# Patient Record
Sex: Female | Born: 1940 | Race: White | Hispanic: No | Smoking: Former smoker
Health system: Southern US, Community
[De-identification: ages and names within clinical notes are randomized; demographics above are authoritative.]

## PROBLEM LIST (undated history)

## (undated) ENCOUNTER — Emergency Department (HOSPITAL_COMMUNITY): Payer: No Typology Code available for payment source

## (undated) DIAGNOSIS — R296 Repeated falls: Secondary | ICD-10-CM

## (undated) DIAGNOSIS — Z87442 Personal history of urinary calculi: Secondary | ICD-10-CM

## (undated) DIAGNOSIS — M1990 Unspecified osteoarthritis, unspecified site: Secondary | ICD-10-CM

## (undated) DIAGNOSIS — D649 Anemia, unspecified: Secondary | ICD-10-CM

## (undated) DIAGNOSIS — G629 Polyneuropathy, unspecified: Secondary | ICD-10-CM

## (undated) DIAGNOSIS — H3530 Unspecified macular degeneration: Secondary | ICD-10-CM

## (undated) DIAGNOSIS — K5900 Constipation, unspecified: Secondary | ICD-10-CM

## (undated) DIAGNOSIS — F329 Major depressive disorder, single episode, unspecified: Secondary | ICD-10-CM

## (undated) DIAGNOSIS — C801 Malignant (primary) neoplasm, unspecified: Secondary | ICD-10-CM

## (undated) DIAGNOSIS — Z8709 Personal history of other diseases of the respiratory system: Secondary | ICD-10-CM

## (undated) DIAGNOSIS — M2550 Pain in unspecified joint: Secondary | ICD-10-CM

## (undated) DIAGNOSIS — C55 Malignant neoplasm of uterus, part unspecified: Secondary | ICD-10-CM

## (undated) DIAGNOSIS — M810 Age-related osteoporosis without current pathological fracture: Secondary | ICD-10-CM

## (undated) DIAGNOSIS — R002 Palpitations: Secondary | ICD-10-CM

## (undated) DIAGNOSIS — M549 Dorsalgia, unspecified: Secondary | ICD-10-CM

## (undated) DIAGNOSIS — I10 Essential (primary) hypertension: Secondary | ICD-10-CM

## (undated) DIAGNOSIS — G2581 Restless legs syndrome: Secondary | ICD-10-CM

## (undated) DIAGNOSIS — I739 Peripheral vascular disease, unspecified: Secondary | ICD-10-CM

## (undated) DIAGNOSIS — R600 Localized edema: Secondary | ICD-10-CM

## (undated) DIAGNOSIS — E559 Vitamin D deficiency, unspecified: Secondary | ICD-10-CM

## (undated) DIAGNOSIS — F32A Depression, unspecified: Secondary | ICD-10-CM

## (undated) DIAGNOSIS — E785 Hyperlipidemia, unspecified: Secondary | ICD-10-CM

## (undated) DIAGNOSIS — R911 Solitary pulmonary nodule: Secondary | ICD-10-CM

## (undated) DIAGNOSIS — R51 Headache: Secondary | ICD-10-CM

## (undated) DIAGNOSIS — G8929 Other chronic pain: Secondary | ICD-10-CM

## (undated) DIAGNOSIS — R531 Weakness: Secondary | ICD-10-CM

## (undated) DIAGNOSIS — F419 Anxiety disorder, unspecified: Secondary | ICD-10-CM

## (undated) DIAGNOSIS — R202 Paresthesia of skin: Secondary | ICD-10-CM

## (undated) DIAGNOSIS — R519 Headache, unspecified: Secondary | ICD-10-CM

## (undated) DIAGNOSIS — H547 Unspecified visual loss: Secondary | ICD-10-CM

## (undated) DIAGNOSIS — Z8739 Personal history of other diseases of the musculoskeletal system and connective tissue: Secondary | ICD-10-CM

## (undated) DIAGNOSIS — R609 Edema, unspecified: Secondary | ICD-10-CM

## (undated) DIAGNOSIS — Z8614 Personal history of Methicillin resistant Staphylococcus aureus infection: Secondary | ICD-10-CM

## (undated) DIAGNOSIS — R351 Nocturia: Secondary | ICD-10-CM

## (undated) DIAGNOSIS — K219 Gastro-esophageal reflux disease without esophagitis: Secondary | ICD-10-CM

## (undated) HISTORY — PX: COLONOSCOPY: SHX174

## (undated) HISTORY — PX: BACK SURGERY: SHX140

## (undated) HISTORY — PX: NOSE SURGERY: SHX723

## (undated) HISTORY — DX: Age-related osteoporosis without current pathological fracture: M81.0

## (undated) HISTORY — PX: CERVICAL SPINE SURGERY: SHX589

## (undated) HISTORY — PX: HEMORRHOID SURGERY: SHX153

## (undated) HISTORY — DX: Solitary pulmonary nodule: R91.1

## (undated) HISTORY — DX: Palpitations: R00.2

## (undated) HISTORY — PX: CYSTOSCOPY: SUR368

## (undated) HISTORY — PX: FOOT SURGERY: SHX648

## (undated) HISTORY — PX: EYE SURGERY: SHX253

## (undated) HISTORY — PX: OTHER SURGICAL HISTORY: SHX169

## (undated) HISTORY — DX: Hyperlipidemia, unspecified: E78.5

## (undated) HISTORY — PX: JOINT REPLACEMENT: SHX530

---

## 1989-04-15 HISTORY — PX: ABDOMINAL HYSTERECTOMY: SHX81

## 2001-01-05 ENCOUNTER — Inpatient Hospital Stay (HOSPITAL_COMMUNITY): Admission: RE | Admit: 2001-01-05 | Discharge: 2001-01-09 | Payer: Self-pay | Admitting: Neurosurgery

## 2001-02-03 ENCOUNTER — Encounter: Admission: RE | Admit: 2001-02-03 | Discharge: 2001-02-03 | Payer: Self-pay | Admitting: Neurosurgery

## 2001-02-13 ENCOUNTER — Encounter: Admission: RE | Admit: 2001-02-13 | Discharge: 2001-02-23 | Payer: Self-pay | Admitting: Neurosurgery

## 2002-04-12 ENCOUNTER — Inpatient Hospital Stay (HOSPITAL_COMMUNITY): Admission: RE | Admit: 2002-04-12 | Discharge: 2002-04-16 | Payer: Self-pay | Admitting: Neurosurgery

## 2002-11-04 ENCOUNTER — Encounter: Admission: RE | Admit: 2002-11-04 | Discharge: 2002-12-01 | Payer: Self-pay | Admitting: Neurosurgery

## 2004-03-15 ENCOUNTER — Ambulatory Visit (HOSPITAL_COMMUNITY): Admission: RE | Admit: 2004-03-15 | Discharge: 2004-03-15 | Payer: Self-pay | Admitting: Gastroenterology

## 2004-04-10 ENCOUNTER — Ambulatory Visit (HOSPITAL_COMMUNITY): Admission: RE | Admit: 2004-04-10 | Discharge: 2004-04-10 | Payer: Self-pay | Admitting: Neurosurgery

## 2004-06-18 ENCOUNTER — Inpatient Hospital Stay (HOSPITAL_COMMUNITY): Admission: RE | Admit: 2004-06-18 | Discharge: 2004-06-23 | Payer: Self-pay | Admitting: Neurosurgery

## 2004-10-24 ENCOUNTER — Emergency Department (HOSPITAL_COMMUNITY): Admission: EM | Admit: 2004-10-24 | Discharge: 2004-10-24 | Payer: Self-pay | Admitting: Emergency Medicine

## 2005-01-03 ENCOUNTER — Ambulatory Visit (HOSPITAL_COMMUNITY): Admission: RE | Admit: 2005-01-03 | Discharge: 2005-01-03 | Payer: Self-pay | Admitting: Otolaryngology

## 2005-01-03 ENCOUNTER — Ambulatory Visit (HOSPITAL_BASED_OUTPATIENT_CLINIC_OR_DEPARTMENT_OTHER): Admission: RE | Admit: 2005-01-03 | Discharge: 2005-01-03 | Payer: Self-pay | Admitting: Otolaryngology

## 2005-05-13 DIAGNOSIS — I10 Essential (primary) hypertension: Secondary | ICD-10-CM | POA: Insufficient documentation

## 2005-08-05 ENCOUNTER — Encounter: Admission: RE | Admit: 2005-08-05 | Discharge: 2005-08-21 | Payer: Self-pay | Admitting: Neurosurgery

## 2005-08-25 ENCOUNTER — Ambulatory Visit (HOSPITAL_COMMUNITY): Admission: RE | Admit: 2005-08-25 | Discharge: 2005-08-25 | Payer: Self-pay | Admitting: Neurosurgery

## 2005-11-29 ENCOUNTER — Ambulatory Visit (HOSPITAL_COMMUNITY): Admission: RE | Admit: 2005-11-29 | Discharge: 2005-11-29 | Payer: Self-pay | Admitting: Anesthesiology

## 2006-06-12 ENCOUNTER — Ambulatory Visit (HOSPITAL_COMMUNITY): Admission: RE | Admit: 2006-06-12 | Discharge: 2006-06-12 | Payer: Self-pay | Admitting: Neurosurgery

## 2006-08-14 DIAGNOSIS — C55 Malignant neoplasm of uterus, part unspecified: Secondary | ICD-10-CM | POA: Insufficient documentation

## 2006-10-23 ENCOUNTER — Encounter: Admission: RE | Admit: 2006-10-23 | Discharge: 2006-10-23 | Payer: Self-pay | Admitting: Orthopedic Surgery

## 2006-10-28 ENCOUNTER — Inpatient Hospital Stay (HOSPITAL_COMMUNITY): Admission: RE | Admit: 2006-10-28 | Discharge: 2006-11-03 | Payer: Self-pay | Admitting: Orthopedic Surgery

## 2006-10-31 ENCOUNTER — Ambulatory Visit: Payer: Self-pay | Admitting: Physical Medicine & Rehabilitation

## 2006-12-02 ENCOUNTER — Encounter: Admission: RE | Admit: 2006-12-02 | Discharge: 2007-01-01 | Payer: Self-pay | Admitting: Orthopaedic Surgery

## 2007-02-03 ENCOUNTER — Encounter: Admission: RE | Admit: 2007-02-03 | Discharge: 2007-02-03 | Payer: Self-pay | Admitting: Neurosurgery

## 2007-02-23 ENCOUNTER — Encounter: Admission: RE | Admit: 2007-02-23 | Discharge: 2007-05-24 | Payer: Self-pay | Admitting: Neurosurgery

## 2007-04-07 ENCOUNTER — Encounter: Admission: RE | Admit: 2007-04-07 | Discharge: 2007-04-07 | Payer: Self-pay | Admitting: Neurosurgery

## 2007-05-11 DIAGNOSIS — E049 Nontoxic goiter, unspecified: Secondary | ICD-10-CM | POA: Insufficient documentation

## 2007-07-16 ENCOUNTER — Inpatient Hospital Stay (HOSPITAL_COMMUNITY): Admission: RE | Admit: 2007-07-16 | Discharge: 2007-07-17 | Payer: Self-pay | Admitting: Neurosurgery

## 2007-10-24 ENCOUNTER — Encounter: Admission: RE | Admit: 2007-10-24 | Discharge: 2007-10-24 | Payer: Self-pay | Admitting: Neurosurgery

## 2007-11-19 ENCOUNTER — Encounter: Admission: RE | Admit: 2007-11-19 | Discharge: 2007-11-19 | Payer: Self-pay | Admitting: Neurosurgery

## 2008-04-29 ENCOUNTER — Encounter: Admission: RE | Admit: 2008-04-29 | Discharge: 2008-04-29 | Payer: Self-pay | Admitting: Neurosurgery

## 2008-05-03 ENCOUNTER — Emergency Department (HOSPITAL_COMMUNITY): Admission: EM | Admit: 2008-05-03 | Discharge: 2008-05-03 | Payer: Self-pay | Admitting: Emergency Medicine

## 2008-05-22 ENCOUNTER — Ambulatory Visit: Payer: Self-pay | Admitting: Radiology

## 2008-05-22 ENCOUNTER — Emergency Department (HOSPITAL_BASED_OUTPATIENT_CLINIC_OR_DEPARTMENT_OTHER): Admission: EM | Admit: 2008-05-22 | Discharge: 2008-05-22 | Payer: Self-pay | Admitting: Emergency Medicine

## 2008-06-13 ENCOUNTER — Ambulatory Visit (HOSPITAL_COMMUNITY): Admission: RE | Admit: 2008-06-13 | Discharge: 2008-06-13 | Payer: Self-pay | Admitting: Ophthalmology

## 2008-11-03 ENCOUNTER — Encounter: Admission: RE | Admit: 2008-11-03 | Discharge: 2008-11-03 | Payer: Self-pay | Admitting: Family Medicine

## 2009-03-15 ENCOUNTER — Encounter: Payer: Self-pay | Admitting: Cardiovascular Disease

## 2009-03-20 DIAGNOSIS — M109 Gout, unspecified: Secondary | ICD-10-CM | POA: Insufficient documentation

## 2009-04-15 DIAGNOSIS — Z8614 Personal history of Methicillin resistant Staphylococcus aureus infection: Secondary | ICD-10-CM

## 2009-04-15 HISTORY — DX: Personal history of Methicillin resistant Staphylococcus aureus infection: Z86.14

## 2009-05-05 ENCOUNTER — Encounter: Admission: RE | Admit: 2009-05-05 | Discharge: 2009-05-05 | Payer: Self-pay | Admitting: Neurology

## 2009-05-16 ENCOUNTER — Encounter: Payer: Self-pay | Admitting: Cardiovascular Disease

## 2009-05-19 ENCOUNTER — Encounter: Payer: Self-pay | Admitting: Cardiovascular Disease

## 2009-05-31 DIAGNOSIS — E782 Mixed hyperlipidemia: Secondary | ICD-10-CM | POA: Insufficient documentation

## 2009-05-31 DIAGNOSIS — E785 Hyperlipidemia, unspecified: Secondary | ICD-10-CM | POA: Insufficient documentation

## 2009-05-31 DIAGNOSIS — M810 Age-related osteoporosis without current pathological fracture: Secondary | ICD-10-CM | POA: Insufficient documentation

## 2009-05-31 DIAGNOSIS — H3530 Unspecified macular degeneration: Secondary | ICD-10-CM | POA: Insufficient documentation

## 2009-05-31 DIAGNOSIS — R002 Palpitations: Secondary | ICD-10-CM | POA: Insufficient documentation

## 2009-05-31 DIAGNOSIS — E119 Type 2 diabetes mellitus without complications: Secondary | ICD-10-CM | POA: Insufficient documentation

## 2009-05-31 DIAGNOSIS — I10 Essential (primary) hypertension: Secondary | ICD-10-CM | POA: Insufficient documentation

## 2009-06-05 ENCOUNTER — Ambulatory Visit: Payer: Self-pay | Admitting: Cardiovascular Disease

## 2009-06-05 DIAGNOSIS — M79609 Pain in unspecified limb: Secondary | ICD-10-CM | POA: Insufficient documentation

## 2009-06-08 ENCOUNTER — Ambulatory Visit (HOSPITAL_COMMUNITY): Admission: RE | Admit: 2009-06-08 | Discharge: 2009-06-08 | Payer: Self-pay | Admitting: Cardiovascular Disease

## 2009-06-08 ENCOUNTER — Encounter: Payer: Self-pay | Admitting: Cardiovascular Disease

## 2009-06-08 ENCOUNTER — Ambulatory Visit: Payer: Self-pay | Admitting: Internal Medicine

## 2009-06-08 ENCOUNTER — Ambulatory Visit: Payer: Self-pay

## 2009-07-03 ENCOUNTER — Ambulatory Visit: Payer: Self-pay | Admitting: Cardiovascular Disease

## 2009-07-26 ENCOUNTER — Encounter (HOSPITAL_BASED_OUTPATIENT_CLINIC_OR_DEPARTMENT_OTHER): Admission: RE | Admit: 2009-07-26 | Discharge: 2009-09-06 | Payer: Self-pay | Admitting: Internal Medicine

## 2009-07-27 ENCOUNTER — Ambulatory Visit (HOSPITAL_COMMUNITY): Admission: RE | Admit: 2009-07-27 | Discharge: 2009-07-27 | Payer: Self-pay | Admitting: Internal Medicine

## 2009-08-19 ENCOUNTER — Ambulatory Visit (HOSPITAL_COMMUNITY): Admission: RE | Admit: 2009-08-19 | Discharge: 2009-08-19 | Payer: Self-pay | Admitting: Internal Medicine

## 2009-12-05 DIAGNOSIS — M5030 Other cervical disc degeneration, unspecified cervical region: Secondary | ICD-10-CM | POA: Insufficient documentation

## 2009-12-05 DIAGNOSIS — M4802 Spinal stenosis, cervical region: Secondary | ICD-10-CM | POA: Insufficient documentation

## 2010-01-19 ENCOUNTER — Encounter: Admission: RE | Admit: 2010-01-19 | Discharge: 2010-01-19 | Payer: Self-pay | Admitting: Neurosurgery

## 2010-02-28 ENCOUNTER — Inpatient Hospital Stay (HOSPITAL_COMMUNITY): Admission: RE | Admit: 2010-02-28 | Discharge: 2010-03-02 | Payer: Self-pay | Admitting: Neurosurgery

## 2010-05-07 ENCOUNTER — Encounter: Payer: Self-pay | Admitting: Neurosurgery

## 2010-05-15 NOTE — Procedures (Signed)
Summary: End of Service Summary Report  End of Service Summary Report   Imported By: Debby Freiberg 07/24/2009 16:45:11  _____________________________________________________________________  External Attachment:    Type:   Image     Comment:   External Document

## 2010-05-15 NOTE — Assessment & Plan Note (Signed)
Summary: np6/palps   Visit Type:  new pt visit Primary Provider:  Dewain Penning  CC:  palpitations..pt states that Dr. Yehuda Budd could not find her pedal pulse....edema/ankles.Marland Kitchendenies any cp or sob.  History of Present Illness: 70 yo WF with history of DM, HTN, Hyperlipidemia, OA who is here today for further evaluation of palpitations. She describes "quivering" in her chest that occurs during the day and at night. It never wakes her up from her sleep. No associated dizziness, SOB, chest pain. She is limited by her back pain. She has had four prior back surgeries. She has had a prior stress test before 2005. She describes restless legs at night and also cramping of her legs at night. She has no claudication type symptoms. There has been difficulty palpating her pulses in the past but she has never had arterial doppler examinations. She does have an ulcer on the second left toe that has been followed by podiatry with ongoing treatment with antibiotics.   Current Medications (verified): 1)  Micardis Hct 80-12.5 Mg Tabs (Telmisartan-Hctz) .Marland Kitchen.. 1 Tab Once Daily 2)  Hydrochlorothiazide 25 Mg Tabs (Hydrochlorothiazide) .Marland Kitchen.. 1 Tab Once Daily 3)  Effexor Xr 75 Mg Xr24h-Cap (Venlafaxine Hcl) .Marland Kitchen.. 1 Tab Once Daily 4)  Colchicine 0.6 Mg Tabs (Colchicine) .Marland Kitchen.. 1 Tab Three Times A Day 5)  Aspirin Ec 325 Mg Tbec (Aspirin) .... Take One Tablet By Mouth Daily 6)  Folic Acid 400 Mcg Tabs (Folic Acid) .Marland Kitchen.. 1 Tab Once Daily 7)  Metformin Hcl 500 Mg Tabs (Metformin Hcl) .Marland Kitchen.. 1 Tab Once Daily 8)  Hydrocodone-Acetaminophen 10-500 Mg Tabs (Hydrocodone-Acetaminophen) .Marland Kitchen.. 1 Tab Once Daily As Needed 9)  Simvastatin 80 Mg Tabs (Simvastatin) .Marland Kitchen.. 1 Tab At Bedtime 10)  Diazepam 10 Mg Tabs (Diazepam) .Marland Kitchen.. 1 Tab At Bedtime 11)  Levaquin 500 Mg Tabs (Levofloxacin) .Marland Kitchen.. 1 Tab Once Daily 12)  Calcium-Vitamin D 600-200 Mg-Unit Tabs (Calcium-Vitamin D) .Marland Kitchen.. 1 Tab Once Daily 13)  Magnesium Oxide 400 Mg Caps (Magnesium Oxide) .Marland Kitchen.. 1  Cap Once Daily 14)  Elite Zinc 15 Mg Caps (Zinc) .Marland Kitchen.. 1 Tab Once Daily 15)  Fish Oil 1000 Mg Caps (Omega-3 Fatty Acids) .Marland Kitchen.. 1 Cap Once Daily 16)  Vitamin C 500 Mg Tabs (Ascorbic Acid) .Marland Kitchen.. 1 Tab Once Daily 17)  Vitamin E 400 Unit Caps (Vitamin E) .Marland Kitchen.. 1 Cap Once Daily 18)  Vigamox 0.5 % Soln (Moxifloxacin Hcl) .... Use As Directed When Getting Eye Injection Monthly 19)  Colace 100 Mg Caps (Docusate Sodium) .Marland Kitchen.. 1 Cap At Bedtime 20)  Vision Formula  Tabs (Multiple Vitamins-Minerals) .Marland Kitchen.. 1 Tab Once Daily  Past History:  Past Medical History: HYPERLIPIDEMIA (ICD-272.4) HYPERTENSION (ICD-401.9) Osteoarthritis MACULAR DEGENERATION (ICD-362.50) DIABETES MELLITUS, TYPE II (ICD-250.00) Uterine cancer    Past Surgical History:  back surgery x 3, cervical  disk  surgery x 1, hemorrhoidectomy hysterectomy Right knee replacement nasal septoplasty   Family History: Reviewed history and no changes required. Mother alive at age 71. She has h/o CAD/CABG at age 55, colon cancer Father: deceased, CAD/ CABG. CVA at 73. Sister with NSTEMI age 79 Brother deceased age 30 MI Brother alive age 64 MI  Social History: Reviewed history from 05/31/2009 and no changes required. Tobacco Use -Remote use. Smoked 1ppd for 45 years. None since 2002.  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  Review of Systems       The patient complains of palpitations, joint pain, leg swelling, and skin lesions.  The patient denies fatigue, malaise, fever, weight gain/loss, vision loss, decreased hearing, hoarseness, chest pain, shortness of breath, prolonged cough, wheezing, sleep apnea, coughing up blood, abdominal pain, blood in stool, nausea, vomiting, diarrhea, heartburn, incontinence, blood in urine, muscle weakness, rash, headache, fainting, dizziness, depression, anxiety, enlarged lymph nodes, easy bruising or bleeding, and environmental allergies.    Vital  Signs:  Patient profile:   70 year old female Height:      65 inches Weight:      219 pounds BMI:     36.58 Pulse rate:   81 / minute Pulse rhythm:   regular BP sitting:   114 / 60  (left arm) Cuff size:   large  Vitals Entered By: Danielle Rankin, CMA (June 05, 2009 3:06 PM)  Physical Exam  General:  General: Well developed, well nourished, NAD HEENT: OP clear, mucus membranes moist SKIN: warm, dry Neuro: No focal deficits Musculoskeletal: Muscle strength 5/5 all ext Psychiatric: Mood and affect normal Neck: No JVD, no carotid bruits, no thyromegaly, no lymphadenopathy. Lungs:Clear bilaterally, no wheezes, rhonci, crackles CV: RRR no murmurs, gallops rubs Abdomen: soft, NT, ND, BS present Extremities: No edema, pulses difficult to palpate. Hand held doppler with DP/PT pulses present bilaterally.     EKG  Procedure date:  06/05/2009  Findings:      NSR, rate 81 bpm.   Impression & Recommendations:  Problem # 1:  PALPITATIONS (ICD-785.1) This may represent premature beats but will have her wear a 21 day monitor to better define and rule out atrial fibrillation, SVT or ventricular arrhythmias. Her symptoms are occuring once every 10 days so I think it is necessary to have her wear the monitor for 21 days. Will check echo to assess LV size and function.   Her updated medication list for this problem includes:    Aspirin Ec 325 Mg Tbec (Aspirin) .Marland Kitchen... Take one tablet by mouth daily  Orders: EKG w/ Interpretation (93000) Event (Event) Echocardiogram (Echo)  Problem # 2:  LEG PAIN, BILATERAL (ICD-729.5) Her symptoms are at rest and at night. She has no claudication. Hand held doppler with good signals in the bilateral DP and PT arteries. No further PV workup suggested at this time.   Patient Instructions: 1)  Your physician recommends that you schedule a follow-up appointment in: 4 weeks 2)  Your physician has requested that you have an echocardiogram.   Echocardiography is a painless test that uses sound waves to create images of your heart. It provides your doctor with information about the size and shape of your heart and how well your heart's chambers and valves are working.  This procedure takes approximately one hour. There are no restrictions for this procedure. 3)  Your physician has recommended that you wear an event monitor.  Event monitors are medical devices that record the heart's electrical activity. Doctors most often use these monitors to diagnose arrhythmias. Arrhythmias are problems with the speed or rhythm of the heartbeat. The monitor is a small, portable device. You can wear one while you do your normal daily activities. This is usually used to diagnose what is causing palpitations/syncope (passing out).

## 2010-05-15 NOTE — Assessment & Plan Note (Signed)
Summary: 1 month rov   Visit Type:  Follow-up Primary Provider:  Herb Grays  CC:  edema/feet/hands...denies any cp or sob.  History of Present Illness: 70 yo WF with history of DM, HTN, Hyperlipidemia, OA who is here today for cardiac follow up. She was seen as a new patient one month ago for further evaluation of palpitations. She described "quivering" in her chest that occurs during the day and at night. It never wakes her up from her sleep. No associated dizziness, SOB, chest pain. She is limited by her back pain. She has had four prior back surgeries. She has had a prior stress test before 2005. She describes restless legs at night and also cramping of her legs at night. She has no claudication type symptoms. There has been difficulty palpating her pulses in the past but she has never had arterial doppler examinations. She does have an ulcer on the second left toe that has been followed by podiatry with ongoing treatment with antibiotics. At the first visit, I ordered an echo and an event monitor.   She is here for follow up and to review the results of her echo and event monitor. She has had rare palpitations and even had some while wearing the event monitor. No chest pain or SOB. She still has the non-healing ulcer on her left second toe. She has been told by her podiatrist that she needs an amputation. She has not had formal arterial studies on the lower extremities but by handheld doppler, she does have patent DP and PT pulses on the left. She has been referred to see [facility] specialists tomorrow for further evaluation. She has no claudication.   Current Medications (verified): 1)  Micardis Hct 80-12.5 Mg Tabs (Telmisartan-Hctz) .Marland Kitchen.. 1 Tab Once Daily 2)  Hydrochlorothiazide 25 Mg Tabs (Hydrochlorothiazide) .Marland Kitchen.. 1 Tab Once Daily 3)  Effexor Xr 75 Mg Xr24h-Cap (Venlafaxine Hcl) .Marland Kitchen.. 1 Tab Once Daily 4)  Aspirin Ec 325 Mg Tbec (Aspirin) .... Take One Tablet By Mouth Daily 5)  Folic  Acid 400 Mcg Tabs (Folic Acid) .Marland Kitchen.. 1 Tab Once Daily 6)  Metformin Hcl 500 Mg Tabs (Metformin Hcl) .Marland Kitchen.. 1 Tab Once Daily 7)  Hydrocodone-Acetaminophen 10-500 Mg Tabs (Hydrocodone-Acetaminophen) .Marland Kitchen.. 1 Tab Once Daily As Needed 8)  Simvastatin 80 Mg Tabs (Simvastatin) .Marland Kitchen.. 1 Tab At Bedtime 9)  Diazepam 10 Mg Tabs (Diazepam) .Marland Kitchen.. 1 Tab At Bedtime 10)  Calcium-Vitamin D 600-200 Mg-Unit Tabs (Calcium-Vitamin D) .Marland Kitchen.. 1 Tab Once Daily 11)  Magnesium Oxide 400 Mg Caps (Magnesium Oxide) .Marland Kitchen.. 1 Cap Once Daily 12)  Elite Zinc 15 Mg Caps (Zinc) .Marland Kitchen.. 1 Tab Once Daily 13)  Fish Oil 1000 Mg Caps (Omega-3 Fatty Acids) .Marland Kitchen.. 1 Cap Once Daily 14)  Vitamin C 500 Mg Tabs (Ascorbic Acid) .Marland Kitchen.. 1 Tab Once Daily 15)  Vitamin E 400 Unit Caps (Vitamin E) .Marland Kitchen.. 1 Cap Once Daily 16)  Vigamox 0.5 % Soln (Moxifloxacin Hcl) .... Use As Directed When Getting Eye Injection Monthly 17)  Colace 100 Mg Caps (Docusate Sodium) .Marland Kitchen.. 1 Cap At Bedtime 18)  Vision Formula  Tabs (Multiple Vitamins-Minerals) .Marland Kitchen.. 1 Tab Once Daily  Allergies (verified): 1)  ! Codeine 2)  ! * Oxycontin  Past History:  Past Medical History: Reviewed history from 06/05/2009 and no changes required. HYPERLIPIDEMIA (ICD-272.4) HYPERTENSION (ICD-401.9) Osteoarthritis MACULAR DEGENERATION (ICD-362.50) DIABETES MELLITUS, TYPE II (ICD-250.00) Uterine cancer    Social History: Reviewed history from 06/05/2009 and no changes required. Tobacco Use -Remote use.  Smoked 1ppd for 45 years. None since 2002.  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  Review of Systems       The patient complains of palpitations.  The patient denies fatigue, malaise, fever, weight gain/loss, vision loss, decreased hearing, hoarseness, chest pain, shortness of breath, prolonged cough, wheezing, sleep apnea, coughing up blood, abdominal pain, blood in stool, nausea, vomiting, diarrhea, heartburn, incontinence, blood in urine, muscle  weakness, joint pain, leg swelling, rash, skin lesions, headache, fainting, dizziness, depression, anxiety, enlarged lymph nodes, easy bruising or bleeding, and environmental allergies.    Vital Signs:  Patient profile:   70 year old female Height:      65 inches Weight:      216 pounds BMI:     36.07 Pulse rate:   76 / minute Pulse rhythm:   regular BP sitting:   118 / 60  (left arm) Cuff size:   large  Vitals Entered By: Danielle Rankin, CMA (July 03, 2009 12:04 PM)  Physical Exam  General:  General: Well developed, well nourished, NAD Psychiatric: Mood and affect normal Neck: No JVD, no carotid bruits, no thyromegaly, no lymphadenopathy. Lungs:Clear bilaterally, no wheezes, rhonci, crackles CV: RRR no murmurs, gallops rubs Abdomen: soft, NT, ND, BS present Extremities: No edema. Pedal pulses difficult to palpate bilateral DP/PT. Handheld doppler with patent DP/PT bilaterally.  Bandage in place second left toe.    Echocardiogram  Procedure date:  06/08/2009  Findings:      - Left ventricle: The cavity size was normal. There was mild focal       basal hypertrophy of the septum. The estimated ejection fraction       was 55%. Wall motion was normal; there were no regional wall       motion abnormalities. Left ventricular diastolic function       parameters were normal.     - Left atrium: The atrium was mildly dilated.  Event Monitor  Procedure date:  07/03/2009  Findings:      Sinus rhythm. Rare Premature Atrial Contractions. No VT,SVT or atrial fibrillation.   Impression & Recommendations:  Problem # 1:  PALPITATIONS (ICD-785.1) Event monitor with rare PACs. No other rhythm abnormalities. Echo with normal LV size and function. No significant valvular issues. No further workup.   Her updated medication list for this problem includes:    Aspirin Ec 325 Mg Tbec (Aspirin) .Marland Kitchen... Take one tablet by mouth daily  Her updated medication list for this problem includes:     Aspirin Ec 325 Mg Tbec (Aspirin) .Marland Kitchen... Take one tablet by mouth daily  Patient Instructions: 1)  Your physician recommends that you schedule a follow-up appointment as needed  Appended Document: 1 month rov Event monitor reviewed. NSR with no SVT or VT. cdm

## 2010-06-26 LAB — GLUCOSE, CAPILLARY
Glucose-Capillary: 102 mg/dL — ABNORMAL HIGH (ref 70–99)
Glucose-Capillary: 104 mg/dL — ABNORMAL HIGH (ref 70–99)
Glucose-Capillary: 107 mg/dL — ABNORMAL HIGH (ref 70–99)
Glucose-Capillary: 108 mg/dL — ABNORMAL HIGH (ref 70–99)
Glucose-Capillary: 114 mg/dL — ABNORMAL HIGH (ref 70–99)
Glucose-Capillary: 117 mg/dL — ABNORMAL HIGH (ref 70–99)
Glucose-Capillary: 120 mg/dL — ABNORMAL HIGH (ref 70–99)
Glucose-Capillary: 130 mg/dL — ABNORMAL HIGH (ref 70–99)
Glucose-Capillary: 150 mg/dL — ABNORMAL HIGH (ref 70–99)
Glucose-Capillary: 186 mg/dL — ABNORMAL HIGH (ref 70–99)

## 2010-06-26 LAB — SURGICAL PCR SCREEN
MRSA, PCR: POSITIVE — AB
Staphylococcus aureus: POSITIVE — AB

## 2010-06-26 LAB — BASIC METABOLIC PANEL
BUN: 22 mg/dL (ref 6–23)
CO2: 33 mEq/L — ABNORMAL HIGH (ref 19–32)
Calcium: 10.1 mg/dL (ref 8.4–10.5)
Chloride: 99 mEq/L (ref 96–112)
Creatinine, Ser: 0.94 mg/dL (ref 0.4–1.2)
GFR calc Af Amer: >60 mL/min (ref >60)
GFR calc non Af Amer: 59 mL/min — ABNORMAL LOW (ref >60)
Glucose, Bld: 91 mg/dL (ref 70–99)
Potassium: 4.4 mEq/L (ref 3.5–5.1)
Sodium: 140 mEq/L (ref 135–145)

## 2010-06-26 LAB — CBC
HCT: 38.4 % (ref 36.0–46.0)
Hemoglobin: 12.5 g/dL (ref 12.0–15.0)
MCH: 31.3 pg (ref 26.0–34.0)
MCHC: 32.6 g/dL (ref 30.0–36.0)
MCV: 96 fL (ref 78.0–100.0)
Platelets: 238 10*3/uL (ref 150–400)
RBC: 4 MIL/uL (ref 3.87–5.11)
RDW: 12.6 % (ref 11.5–15.5)
WBC: 6.4 10*3/uL (ref 4.0–10.5)

## 2010-07-26 LAB — PROTIME-INR
INR: 0.9 (ref 0.00–1.49)
Prothrombin Time: 12 seconds (ref 11.6–15.2)

## 2010-07-26 LAB — GLUCOSE, CAPILLARY
Glucose-Capillary: 101 mg/dL — ABNORMAL HIGH (ref 70–99)
Glucose-Capillary: 85 mg/dL (ref 70–99)
Glucose-Capillary: 89 mg/dL (ref 70–99)

## 2010-07-26 LAB — CBC
HCT: 40.4 % (ref 36.0–46.0)
Hemoglobin: 14 g/dL (ref 12.0–15.0)
MCHC: 34.6 g/dL (ref 30.0–36.0)
MCV: 96.7 fL (ref 78.0–100.0)
Platelets: 250 10*3/uL (ref 150–400)
RBC: 4.18 MIL/uL (ref 3.87–5.11)
RDW: 12.6 % (ref 11.5–15.5)
WBC: 4.8 10*3/uL (ref 4.0–10.5)

## 2010-07-26 LAB — APTT: aPTT: 26 seconds (ref 24–37)

## 2010-07-26 LAB — BASIC METABOLIC PANEL
BUN: 14 mg/dL (ref 6–23)
CO2: 30 mEq/L (ref 19–32)
Calcium: 9.6 mg/dL (ref 8.4–10.5)
Chloride: 102 mEq/L (ref 96–112)
Creatinine, Ser: 0.79 mg/dL (ref 0.4–1.2)
GFR calc Af Amer: >60 mL/min (ref >60)
GFR calc non Af Amer: >60 mL/min (ref >60)
Glucose, Bld: 97 mg/dL (ref 70–99)
Potassium: 4 mEq/L (ref 3.5–5.1)
Sodium: 139 mEq/L (ref 135–145)

## 2010-07-30 LAB — CBC
HCT: 40.5 % (ref 36.0–46.0)
Hemoglobin: 13.2 g/dL (ref 12.0–15.0)
MCHC: 32.6 g/dL (ref 30.0–36.0)
MCV: 99.6 fL (ref 78.0–100.0)
Platelets: 332 10*3/uL (ref 150–400)
RBC: 4.07 MIL/uL (ref 3.87–5.11)
RDW: 12.1 % (ref 11.5–15.5)
WBC: 7.2 10*3/uL (ref 4.0–10.5)

## 2010-07-30 LAB — DIFFERENTIAL
Basophils Absolute: 0 10*3/uL (ref 0.0–0.1)
Basophils Relative: 0 % (ref 0–1)
Eosinophils Absolute: 0.2 10*3/uL (ref 0.0–0.7)
Eosinophils Relative: 2 % (ref 0–5)
Lymphocytes Relative: 35 % (ref 12–46)
Lymphs Abs: 2.5 10*3/uL (ref 0.7–4.0)
Monocytes Absolute: 0.5 10*3/uL (ref 0.1–1.0)
Monocytes Relative: 7 % (ref 3–12)
Neutro Abs: 3.9 10*3/uL (ref 1.7–7.7)
Neutrophils Relative %: 55 % (ref 43–77)

## 2010-07-30 LAB — SEDIMENTATION RATE: Sed Rate: 25 mm/hr — ABNORMAL HIGH (ref 0–22)

## 2010-08-20 ENCOUNTER — Emergency Department (HOSPITAL_COMMUNITY)
Admission: EM | Admit: 2010-08-20 | Discharge: 2010-08-20 | Disposition: A | Discharge status: Home / Self Care | Payer: Medicare Other | Attending: Emergency Medicine | Admitting: Emergency Medicine

## 2010-08-20 ENCOUNTER — Emergency Department (HOSPITAL_COMMUNITY): Payer: Medicare Other

## 2010-08-20 DIAGNOSIS — M810 Age-related osteoporosis without current pathological fracture: Secondary | ICD-10-CM | POA: Insufficient documentation

## 2010-08-20 DIAGNOSIS — Y9210 Unspecified residential institution as the place of occurrence of the external cause: Secondary | ICD-10-CM | POA: Insufficient documentation

## 2010-08-20 DIAGNOSIS — S1093XA Contusion of unspecified part of neck, initial encounter: Secondary | ICD-10-CM | POA: Insufficient documentation

## 2010-08-20 DIAGNOSIS — W010XXA Fall on same level from slipping, tripping and stumbling without subsequent striking against object, initial encounter: Secondary | ICD-10-CM | POA: Insufficient documentation

## 2010-08-20 DIAGNOSIS — Z79899 Other long term (current) drug therapy: Secondary | ICD-10-CM | POA: Insufficient documentation

## 2010-08-20 DIAGNOSIS — R220 Localized swelling, mass and lump, head: Secondary | ICD-10-CM | POA: Insufficient documentation

## 2010-08-20 DIAGNOSIS — R110 Nausea: Secondary | ICD-10-CM | POA: Insufficient documentation

## 2010-08-20 DIAGNOSIS — S0003XA Contusion of scalp, initial encounter: Secondary | ICD-10-CM | POA: Insufficient documentation

## 2010-08-20 DIAGNOSIS — I10 Essential (primary) hypertension: Secondary | ICD-10-CM | POA: Insufficient documentation

## 2010-08-20 DIAGNOSIS — IMO0002 Reserved for concepts with insufficient information to code with codable children: Secondary | ICD-10-CM | POA: Insufficient documentation

## 2010-08-20 DIAGNOSIS — E119 Type 2 diabetes mellitus without complications: Secondary | ICD-10-CM | POA: Insufficient documentation

## 2010-08-20 DIAGNOSIS — M542 Cervicalgia: Secondary | ICD-10-CM | POA: Insufficient documentation

## 2010-08-20 DIAGNOSIS — E785 Hyperlipidemia, unspecified: Secondary | ICD-10-CM | POA: Insufficient documentation

## 2010-08-20 DIAGNOSIS — R51 Headache: Secondary | ICD-10-CM | POA: Insufficient documentation

## 2010-08-20 DIAGNOSIS — S51009A Unspecified open wound of unspecified elbow, initial encounter: Secondary | ICD-10-CM | POA: Insufficient documentation

## 2010-08-20 DIAGNOSIS — Z7982 Long term (current) use of aspirin: Secondary | ICD-10-CM | POA: Insufficient documentation

## 2010-08-20 DIAGNOSIS — Z9889 Other specified postprocedural states: Secondary | ICD-10-CM | POA: Insufficient documentation

## 2010-08-20 DIAGNOSIS — M546 Pain in thoracic spine: Secondary | ICD-10-CM | POA: Insufficient documentation

## 2010-08-20 LAB — POCT I-STAT, CHEM 8
BUN: 18 mg/dL (ref 6–23)
Calcium, Ion: 1.19 mmol/L (ref 1.12–1.32)
Chloride: 101 mEq/L (ref 96–112)
Creatinine, Ser: 1.1 mg/dL (ref 0.4–1.2)
Glucose, Bld: 118 mg/dL — ABNORMAL HIGH (ref 70–99)
HCT: 35 % — ABNORMAL LOW (ref 36.0–46.0)
Hemoglobin: 11.9 g/dL — ABNORMAL LOW (ref 12.0–15.0)
Potassium: 3.7 mEq/L (ref 3.5–5.1)
Sodium: 140 mEq/L (ref 135–145)
TCO2: 32 mmol/L (ref 0–100)

## 2010-08-20 LAB — GLUCOSE, CAPILLARY: Glucose-Capillary: 119 mg/dL — ABNORMAL HIGH (ref 70–99)

## 2010-08-28 NOTE — Op Note (Signed)
NAMEMURIELLE, STANG NO.:  [id number]   MEDICAL RECORD NO.:  [id number]          PATIENT TYPE:  OIB   LOCATION:  3524                         FACILITY:  [facility]   PHYSICIAN:  Cristi Loron, M.D.DATE OF BIRTH:  [birth year]   DATE OF PROCEDURE:  07/16/2007  DATE OF DISCHARGE:                               OPERATIVE REPORT   BRIEF HISTORY:  The patient is a 70 year old white female who has  suffered  from neck and left shoulder pain.  She has failed medical  management.  She was worked up with a cervical MRI and cervical myelo CT  which demonstrates some diffuse spondylosis, most prominent at C4-5.  We  also had her see an orthopedic surgeon who evaluated her shoulder and  did not think it was her shoulder.  The patient responded to cervical  epidural injections.  I discussed the various treatment options with the  patient including surgery.  She has weighed the risks, benefits, and  alternatives of the surgery and decided to procedure with the C4-5  anterior cervical diskectomy and fusion plating.   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   PROCEDURE:  C4-5 extensive anterior cervical diskectomy/decompression;  C4-5 anterior interbody arthrodesis with local morselized autograft bone  and Actifuse bone graft extender; insertion of C4-5 interbody prosthesis  (Alphatec PEEK interbody prosthesis), C4-5 anterior cervical plating  with Codman Slim-Loc titanium plate and screws.   SURGEON:  Cristi Loron, M.D.   ASSISTANT:  Coletta Memos, M.D.   ANESTHESIA:  General endotracheal.   ESTIMATED BLOOD LOSS:  50 cc.   SPECIMENS:  None.   DRAINS:  None.   COMPLICATIONS:  None.   DESCRIPTION OF PROCEDURE:  The patient was brought to the operating room  by the anesthesia team.  General  endotracheal anesthesia was induced.  The patient remained in supine position.  A roll was placed under her  shoulders  and placed her neck in slight extension.  Anterior cervical  region was then prepared with Betadine scrub and Betadine solution.  Sterile drapes were applied.  I then injected the area to be incised  with Marcaine with epinephrine solution.  I used a scalpel to make a  transverse incision in the patient's left anterior neck.  I used the  Metzenbaum scissors to divide the platysma muscle and then dissected  medial to the sternocleidomastoid muscle, jugular vein, and carotid  artery.  I carefully dissected down towards the anterior cervical spine  identifying the esophagus and retracting it medially.  We then used the  Kitner swabs to clear the soft tissue from the anterior cervical spine  and inserted a bent spinal needle into the exposed intervertebral disk  space.  We obtained intraoperative radiograph to confirm our location.   We then used electrocautery to detach the medial border of the longus  colli muscle bilaterally  from C4-5 intervertebral disk space.  We  inserted a Caspar self-retaining retractor for exposure.  We then began  the decompression by incising the C4-5  intervertebral disk with a 15  blade scalpel.  We performed a partial intervertebral diskectomy using  the pituitary forceps and Karlin curettes.  We then inserted distraction  screws at C4-C5, distracted interspace.  I then used a high-speed drill  to decorticate the vertebral endplates at C4-5.  I drilled away the  remainder of the intervertebral disk and some posterior spondylosis and  thinned out the posterior longitudinal ligament.  We then incised the  ligament with arachnoid knife and then removed with Kerrison punch  undercutting the vertebral endplates decompressing the thecal sac.  We  then performed foraminotomies  about the bilateral C5 nerve root  completing the decompression at this  level.   We now turned our attention to arthrodesis.  We used trial spacers and  determined to use  a 7-mm medium PEEK interbody prosthesis.  We  prefilled this prosthesis with a combination of local morselized  autograft bone obtained during the decompression as well as Actifuse  bone graft extender.  We inserted this prosthesis into distracted C4-5  interspace and then removed distraction screws.  There was a good snug  fit of the prosthesis in the interspace.  We now turned attention to the  anterior spinal instrumentation.  We used high-speed drill to remove  some ventral spondylosis from the vertebral endplates at C4-5, so that  the plate would lay down flat.  We selected appropriate length Codman  Slim-Loc anterior cervical plate.  We laid it along the anterior aspect  of the vertebral bodies at C4-C5.  We then drilled two  12 mm holes at  C4 and 2 at C5.  We then secured the plate at the vertebral bodies by  placing two 12 mm self-tapping screws at C4 to C5.  We then obtained  intraoperative radiographs which demonstrated good position of plate,  screws, interbody prosthesis.  We then secured the screws and plate by  locking each cam by completing the instrumentation.   We now obtained hemostasis using bipolar electrocautery.  We irrigated  the wound out with bacitracin solution.  We then removed the retractor  and inspected esophagus for any damage, there was none apparent.  We  then reapproximated the patient's platysma muscle with interrupted 3-0  Vicryl suture, subcutaneous tissue with interrupted 3-0 Vicryl suture,  and the skin with Steri-Strips and Benzoin.  The wound was then coated  with bacitracin ointment.  A sterile dressing was applied.  The drapes  were removed and the patient was subsequently extubated by anesthesia  team and transported to postanesthesia care unit in stable condition.  All sponge, instrument, and needle counts were correct in this case.       Cristi Loron, M.D.  Electronically Signed     JDJ/MEDQ  D:  07/16/2007  T:  07/16/2007  Job:  [id number]

## 2010-08-28 NOTE — Op Note (Signed)
Leslie Hendricks, Leslie Hendricks              ACCOUNT NO.:  [id number]   MEDICAL RECORD NO.:  [id number]          PATIENT TYPE:  AMB   LOCATION:  SDS                          FACILITY:  [facility]   PHYSICIAN:  Alford Highland. Rankin, M.D.   DATE OF BIRTH:  1940-07-28   DATE OF PROCEDURE:  06/13/2008  DATE OF DISCHARGE:  06/13/2008                               OPERATIVE REPORT   PREOPERATIVE DIAGNOSES:  1. 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  2. Age-related macular degeneration, right eye.   POSTOPERATIVE DIAGNOSES:  1. 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  2. Age-related macular degeneration, right eye.   PROCEDURES:  1. Posterior vitrectomy, right eye - 25 gauge.  2. Injection of subretinal TPA to mobilize the submacular clot.  3. Injection of vitreous substitute dose - SF6 20%.   SURGEON:  Alford Highland. Rankin, MD   ANESTHESIA:  Local retrobulbar with monitored anesthesia control.   INDICATIONS FOR PROCEDURE:  The patient is a 70 year old woman who has  profound vision loss on the basis of submacular hemorrhage, now clotted,  which has been initially stabilized for age related maculopathy by use  of intravitreal Avastin in the office setting.  It was then attempted to  mobilize the clot and hopefully move this out of the center of the  vision so as to allow for enhanced healing and hopefully some visual  improvement in chance of visual recovery.  The patient understands this  is an attempt to mobilize the clot.  She understands the risks of  anesthesia, including rare occurrence of death, loss of the eye  including but not limited to hemorrhage, infection, scarring, need for  another surgery, no change in vision, loss of vision, progressive  disease despite intervention.   PROCEDURE IN DETAIL:  Appropriate signed consent was obtained.  The  patient was taken to the operating room.  In the operating room,  appropriate monitors followed by mild sedation; 5 mL of  Xylocaine 2%  injected retrobulbar, an additional 5 mL laterally in fashion of  modified Darel Hong.  The left periocular region was sterilely prepped and  draped in usual ophthalmic fashion.  The lid speculum applied.  A 25-  gauge infusion cannula placed inferotemporally.  Superonasal trocar was  applied.  Supratemporal trocar applied.  Core vitrectomy was then begun.  Posterior hyaloid was removed by physician-induced suction nasal to the  optic nerve.  Vitreous skirt trimmed 360 degrees.  A 32-gauge curved  subretinal injection needle was then placed through a 20-guage MVR  expansion of the superotemporal sclerotomy after performance of a  peritomy of this area.  Approximately 0.3 mL of TPA was injected in a  subretinal fashion so as to hopefully mobilize and destroy the clot.  No  attempt was made to aspirate the clot nor the TPA.  Fluid-air exchange  was then carried out overlying the retina.  The blister of an elevation  of the TPA and subretinal clot did not change.  This suggested the fact  that the retinal incision site was indeed a ball-type mechanism as the  size of the bleb did not go down under air.   At this time, the instruments were removed from the eye.  An air-SF-6  exchange, 20%, was then carried out.  Superotemporal sclerotomy was then  closed with 7-0 Vicryl.  The conjunctivae was closed with 7-0 Vicryl.  The  infusion was removed.  The intraocular pressure was assessed and found  to be adequate.  Subconjunctival Decadron applied.  Sterile patch and  Fox shield applied.  The patient tolerated the procedure without  complications and taken to the recovery room in good stable condition.      Alford Highland Rankin, M.D.  Electronically Signed     GAR/MEDQ  D:  06/13/2008  T:  06/14/2008  Job:  [id number]

## 2010-08-28 NOTE — Op Note (Signed)
Leslie Hendricks, Leslie Hendricks              ACCOUNT NO.:  [id number]   MEDICAL RECORD NO.:  [id number]          PATIENT TYPE:  INP   LOCATION:  5030                         FACILITY:  [facility]   PHYSICIAN:  Burnard Bunting, M.D.    DATE OF BIRTH:  [birth year]   DATE OF PROCEDURE:  10/28/2006  DATE OF DISCHARGE:                               OPERATIVE REPORT   PREOPERATIVE DIAGNOSIS:  Right knee arthritis.   POSTOPERATIVE DIAGNOSIS:  Right knee arthritis.   PROCEDURE:  Right total knee.   SURGEON:  Burnard Bunting, M.D.   ASSISTANT:  Jerolyn Shin. Tresa Res, M.D.   ANESTHESIA:  General endotracheal.   ESTIMATED BLOOD LOSS:  150.   COMPONENT:  DePuy size 3 rotating platform femur, tibia 12.5 spacer, 35  patella.   INDICATIONS:  Leslie Hendricks is a 70 year old female with endstage right  knee arthritis who has failed conservative and arthroscopic management  who presents now for total knee replacement after explanation of risks  and benefits.   PROCEDURE IN DETAIL:  The patient was brought to the operating room  where general endotracheal anesthesia was induced.  Preoperative  antibiotics were administered.  The patient's right leg was prepped with  DuraPrep solution and draped in a sterile manner.  Proximal thigh  tourniquet was placed.  Collier Flowers was used to cover the operative field.  The leg was elevated and exsanguinated with an Esmarch wrap.  Tourniquet  was inflated.  The anterior approach to the knee was utilized.  Skin and  subcutaneous tissue were sharply divided.  Median parapatellar  arthrotomy was made.  Precise location of the arthrotomy was marked by  #1 Vicryl suture at the superomedial border of the patella.  The patella  was everted.  Fat pad was partially resected.  Lateral patellofemoral  ligament was released.  Tissue was removed from the anterior distal  aspect of the femur.  Osteophytes were removed.  ACL and PCL were  released.  Two pins were placed in the distal anteromedial  femur and  proximal anteromedial tibia.  With those pins in place, registration  data was obtained with hip center rotation bimalleolar axis and various  points around the knee.  In accordance with preoperative templating cut  perpendicular to the mechanical axis of the tibia was made with the  collateral and posterior structures well protected.  Tensioner was then  checked in hip flexion and extension and the gaps were found to be  symmetric.  Distal femoral cut was then performed along with the chamfer  cut and the box cut.  The tibia was then keel punched and prepared.  The  patella was then prepared in freehand fashion with 11 mm resection made  on 24 mm patella.  Trial components were placed.  The patient had a full  extension, full flexion, neutral alignment, good patellar tracking.  Trial components were removed.  Incision was thoroughly irrigated.  Components were then cemented into position.  Excess cement was removed.  The tourniquet was released.  Bleeding points encountered were  controlled with electrocautery.  Hemovac drain was placed.  The incision  was then closed over a bolster using interrupted #1 Vicryl suture, 0  Vicryl suture, 2-0 Vicryl suture and skin staples.  The incisions for  the pins were closed using 3-0 nylon suture.  The patient tolerated the  procedure well without immediate complications.  Bulky dressing and knee  immobilizer were placed.  It should be noted that Dr. Lenny Pastel  assistance was required for the entire case because of necessity of  retraction for neurovascular structures.  It should also be noted this  patient is morbidly obese which added significantly to the difficulty of  the exposure and retraction of the case.  Her body mass index is well  over 30.      G. Dorene Grebe, M.D.  Electronically Signed     GSD/MEDQ  D:  10/28/2006  T:  10/29/2006  Job:  [id number]

## 2010-08-28 NOTE — Discharge Summary (Signed)
Leslie Hendricks, MEDER              ACCOUNT NO.:  [id number]   MEDICAL RECORD NO.:  [id number]          PATIENT TYPE:  INP   LOCATION:  5030                         FACILITY:  [facility]   PHYSICIAN:  Burnard Bunting, M.D.    DATE OF BIRTH:  30-Sep-1940   DATE OF ADMISSION:  10/28/2006  DATE OF DISCHARGE:  11/03/2006                               DISCHARGE SUMMARY   DISCHARGE DIAGNOSIS:  Right knee arthritis.   SECONDARY DIAGNOSES:  1. History of lumbar back surgery.  2. Hysterectomy.  3. Hemorrhoid surgery.  4. Diabetes.  5. Hypercholesterolemia.  6. Hypertension.   HOSPITAL COURSE:  [name] is a 70 year old female with right knee  arthritis.  She was admitted to the orthopedic surgeon and underwent  right total knee replacement on October 28, 2006.  She tolerated the  procedure well without any immediate complications.  She had no motor  sensory dysfunction of the  foot postoperative day number 1.  She was  started on CPM for range of motion and therapy for mobilization.  Hemoglobin was 11.5.  On postoperative day number 1, Coumadin was  started for DVT prophylaxis.  She mobilized well with physical therapy  after 4-5 days.  She had an otherwise unremarkable recovery.  INR was  therapeutic at the time of discharge.  She was discharged home on  postoperative day 6 in good condition.  She will follow up with me in 7  days for suture removal.  She will go home with home health care.  Weightbearing as tolerated with CPM machine to be used 2-3 hours a day.   DISCHARGE MEDICATIONS:  Include:  1. Coumadin 5 mg p.o. daily to an INR of 2 to 2.5.  2. Micardis.  3. Premarin 0.3 mg p.o. daily.  4. Effexor.  5. Metformin.  6. Simvastatin.  7. Folic acid.  8. Vitamin B12.  9. Vitamin C.  10.Fosamax.  11.Percocet 1-2 p.o. q.3-4 hours p.r.n. pain.     Burnard Bunting, M.D.  Electronically Signed    GSD/MEDQ  D:  12/09/2006  T:  12/10/2006  Job:  [id number]

## 2010-08-31 NOTE — Discharge Summary (Signed)
[place]  Patient:    Leslie Hendricks, Leslie Hendricks Visit Number: [id number] MRN: [id number]          Service Type: SUR Location: [id number] Attending Physician:  Cristi Loron Dictated by:   Cristi Loron, M.D. Admit Date:  01/05/2001 Discharge Date: 01/09/2001                             Discharge Summary  For full details of this admission, please refer to the typed history and physical.  BRIEF HISTORY:  The patient is a 70 year old white female who has suffered from back and bilateral leg pain.  She failed medical management and was worked up with a lumbar MRI which demonstrated a grade 1 spondylolithesis L4-5 with significant spinal stenosis.  I discussed the various treatment options with her including doing nothing, continuing medical management, and surgery. The patient decided to proceed with surgery after weighing the risks, benefits, and alternatives.  For past medical history, past surgical history, medications prior to admission, family medical history, social history, admission physical exam, imaging assessment, assessment and plan, etc. please refer to the typed history and physical.  HOSPITAL COURSE:  I admitted the patient to [facility] on January 05, 2001.  On the day of admission, I performed an L4-5 posterior lumbar interbody fusion with insertion of bilateral tangent bone dowels, posterior nonsegmental instrumentation with CD Horizon titanium pedicle screws and rods at L4-5 bilaterally with posterolateral arthrodesis L4-5 using a local morcellized autograft bone and Vitos bone substitute and performing bilateral L4 laminectomy to treat the patients spinal stenosis.  The surgery went well without complications.  For full details of this operation, please refer to typed operative note.  POSTOPERATIVE COURSE:  The patients postoperative course was essentially unremarkable.  She did have some low-grade fevers,  which resolved.  She never had any other clinical signs of infection.  By January 09, 2001, the patient was afebrile, her vital signs were stable, and she was eating well and ambulating well.  Her wound was healing well without signs of infection and she was requesting discharge to home.  She was therefore discharged to home on January 09, 2001.  DISCHARGE MEDICATIONS:  The patient was discharged to resume her outpatient medical regimen.  She was given a prescription for Demerol 50 mg #60 one to two p.o. q.4h. p.r.n. for pain and Valium 5 mg #50 one p.o. q.6h. p.r.n. for muscles spasms.  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  PROCEDURE PERFORMED:  L4-5 posterior lumbar interbody fusion with insertion of bilateral tangent cortical bone dowels; posterior nonsegmental instrumentation with CD Horizon titanium pedicle screws and rods at L4-5 bilaterally; posterolateral arthrodesis L4-5 with local morcellized autograft bone and Vitos bone substitute; bilateral L4 laminectomy. Dictated by:   Cristi Loron, M.D. Attending Physician:  Tressie Stalker D DD:  01/29/01 TD:  01/31/01 Job: [id number]

## 2010-08-31 NOTE — Op Note (Signed)
[name], Leslie Hendricks              ACCOUNT NO.:  [id number]   MEDICAL RECORD NO.:  [id number]          PATIENT TYPE:  AMB   LOCATION:  DSC                          FACILITY:  [facility]   PHYSICIAN:  Suzanna Obey, M.D.       DATE OF BIRTH:  [birth year]   DATE OF PROCEDURE:  01/03/2005  DATE OF DISCHARGE:                                 OPERATIVE REPORT   PREOPERATIVE DIAGNOSIS:  Deviated septum.   POSTOPERATIVE DIAGNOSIS:  Deviated septum.   SURGICAL PROCEDURE:  Septoplasty.   ANESTHESIA:  General endotracheal tube.   ESTIMATED BLOOD LOSS:  Less than 5 mL.   INDICATIONS FOR PROCEDURE:  This is a 70 year old who fell and hit her nose  back in July.  She now has a new nasal obstruction on the left side.  She  has deviation of her anterior septum into the left vestibule which is  presumably what is causing her nasal obstruction.  She does not seem to have  anymore posterior deviation.  She is ready to proceed with repair.  She was  informed of the risks and benefits of the procedure including bleeding,  infection, loss of nasal support with tip ptosis, septal perforation, change  in the external appearance of the nose, numbness of the teeth, and risks of  the anesthetic.  All questions were answered and consent was obtained.   OPERATION:  The patient was taken to the operating room and placed in the  supine position.  After adequate general endotracheal anesthesia, was  prepped and draped in the usual sterile fashion.  The nose was injected with  1% lidocaine with 1:100,000 epinephrine.  A left hemitransfixion incision  was performed along the deviated septum.  This was elevated on both sides  and we divided about 1 cm posterior to the posterior to the caudal strut.  This was then freed up and there was a large piece of bone spur sticking  into the left vestibule that also looked like it was fractured spine.  This  bone was trimmed and the cartilage was released and positioned back in  the  midline.  It was then sutured in place.  The pocket was created with the  scissors and the septum was then positioned between the two medial crura.  The removal of the bone and repositioning of the anterior septum corrected  the septal deflection.  The  remaining posterior septum was left intact.  The quilting stitch and 4-0  chromic was used to close the incision and quilt the septum between the  crura.  The nasopharynx was suctioned of all blood and debris.  The patient  was then awakened and brought to the recovery room in stable condition.  Counts were correct.           ______________________________  Suzanna Obey, M.D.     JB/MEDQ  D:  01/03/2005  T:  01/03/2005  Job:  [id number]   cc:   Gloriajean Dell. Andrey Campanile, M.D.  [id number]  [place]  [place] [address]  Fax: [phone]

## 2010-08-31 NOTE — H&P (Signed)
. [facility]  Patient:    Leslie Hendricks, Leslie Hendricks Visit Number: [id number] MRN: [id number]          Service Type: Attending:  Cristi Loron, M.D. Dictated by:   Cristi Loron, M.D. Adm. Date:  01/05/01                           History and Physical  CHIEF COMPLAINT:  Back pain and bilateral leg pain.  HISTORY OF PRESENT ILLNESS:  The patient is a 70 year old white female who has had approximately five years of back pain and bilateral leg pain.  She has been treated medically without relief.  She was further worked up with a lumbar MRI which demonstrated spinal stenosis and spondylolisthesis at L4-5. The patient complained of low back pain and right greater than left leg pain when she walks.  PAST MEDICAL HISTORY:  Positive for osteoporosis.  She had a bone density scan in January of 2002.  She has a history of hypercholesterolemia, macular degeneration, and hemorrhoids.  PAST SURGICAL HISTORY:  Hemorrhoidectomy in 1991 and 1976.  MEDICATIONS PRIOR TO ADMISSION: 1. Ogen 1.25 mg p.o. q.d. 2. Fosamax 70 mg p.o. every week. 3. Aspirin 325 mg p.o. q.d. 4. Calcium two p.o. q.d. 5. Ocuvite one p.o. q.d. 6. Tylenol P.M. p.r.n.  DRUG ALLERGIES:  CODEINE causes nausea and vomiting.  FAMILY MEDICAL HISTORY:  The patients mother is age 5 and in good health, except for hypertension.  The patients father died at age 36 of cerebrovascular accident/intracerebral hemorrhage.  He had diabetes mellitus and hypertension.  SOCIAL HISTORY:  The patient is married.  She has no children.  [place]  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.  She denies tobacco, ethanol, and drug use.  REVIEW OF SYSTEMS:  Negative, except as above.  PHYSICAL EXAMINATION:  A pleasant, well-nourished, well-developed, 70 year old white female in no apparent distress.  HEIGHT:  5 feet 6 inches.  WEIGHT:  180 pounds.  HEENT:   Normal.  NECK:  Normal.  THORAX:  Symmetric.  LUNGS:  Clear to auscultation.  HEART:  Regular rate and rhythm.  ABDOMEN:  Soft, nontender, and obese.  BACK:  There is no point tenderness while performing straight leg raise testing and Faber testing negative bilaterally.  NEUROLOGIC:  The patient is alert and oriented x 3.  Cranial nerves II-XII are grossly intact bilaterally.  Vision is diminished bilaterally secondary to macular degeneration.  Hearing is grossly normal bilaterally.  Motor strength is 5/5 in bilateral deltoid, biceps, triceps, hand grip, wrist extensor, psoas, quadriceps, gastrocnemius, and extensor hallucis longus.  Cerebellar exam is intact in rapid alternating movements of the upper extremities bilaterally.  Sensory exam is normal to light touch and pinprick sensation in all tested dermatomes bilaterally.  The deep tendon reflexes are 2/4 in the bilateral biceps, triceps, and quadriceps and absent in bilateral gastrocnemius.  She has bilateral flexor plantar reflexes.  No ankle clonus.  IMAGING STUDIES:  The patient had a lumbar MRI performed without contrast at [facility] on October 02, 2000.  She has a grade 1 acquired spondylolisthesis at L4-5 with significant spinal stenosis.  She has mild degenerative disease at L5-S1.  ASSESSMENT AND PLAN:  L4-5 grade 1 acquired spondylolisthesis, spinal stenosis, degenerative disease, lumbar radiculopathy, and lumbago.  I have discussed the situation with the patient and reviewed the MRI scan with her and pointed  out the abnormalities.  Her signs and symptoms at physical exam are consistent with neurogenic claudication from the abnormalities described above.  I have discussed the various treatment options with her, including doing nothing, and continuing with medical management.  I have described the procedure of a L4 laminectomy to treat the spinal stenosis, as well as an L4-5 posterior lumbar interbody  fusion with placement of pedicle screws and rods with posterolateral arthrodesis to treat her spondylolisthesis.  I described the surgery to her.  We discussed the risks of surgery extensively.  The patient has weighed the risks, benefits, and alternatives of surgery and wants to proceed with the operation on January 05, 2001. Dictated by:   Cristi Loron, M.D. Attending:  Cristi Loron, M.D. DD:  01/05/01 TD:  01/05/01 Job: [id number]

## 2010-08-31 NOTE — Op Note (Signed)
NAMESHANIQUE, ASLINGER NO.:  [id number]   MEDICAL RECORD NO.:  [id number]          PATIENT TYPE:  INP   LOCATION:  3029                         FACILITY:  [facility]   PHYSICIAN:  Cristi Loron, M.D.DATE OF BIRTH:  1940-05-19   DATE OF PROCEDURE:  06/18/2004  DATE OF DISCHARGE:                                 OPERATIVE REPORT   PREOPERATIVE DIAGNOSIS:  L5-S1 degenerative disease, lumbago.   POSTOPERATIVE DIAGNOSIS:  L5-S1 degenerative disease, lumbago.   OPERATION PERFORMED:  L5-S1 posterior lumbar interbody fusion, insertion of  bilateral L5-S1 interbody prostheses (Capstone PEEK cages); L5-S1  posterolateral arthrodesis with local morcellized autograft bone and  Actifuse bone graft substitute; posterior segmental instrumentation, L3 to  S1 with Legacy and CD Horizon titanium pedicle screws and rods.   SURGEON:  Cristi Loron, M.D.   ASSISTANT:  None.   ANESTHESIA:  General endotracheal.   ESTIMATED BLOOD LOSS:  300 mL.   SPECIMENS:  None.   DRAINS:  None.   COMPLICATIONS:  None.   INDICATIONS FOR PROCEDURE:  The patient is a 70 year old white female who  has suffered from predominantly back pain.  I have previously performed an  L3-4 and L4-5 fusion on her.  She was worked up with a lumbar myelo-CT which  demonstrated the patient had significant degenerative disease at L5-S1.  She  has failed medical management, so I discussed the surgical treatment options  of extension of her fusion down to  L5-S1.  The patient weighed the risks,  benefits and alternatives to surgery and decided to proceed with that  surgery.   DESCRIPTION OF PROCEDURE:  The patient was brought to the operating room by  the anesthesia team.  General endotracheal anesthesia was induced.  The  patient was then turned to the prone position on the Wilson frame.  Her  lumbosacral region was then prepared with Betadine scrub and Betadine  solution and sterile drapes were applied.   I then injected the area to be  incised with Marcaine with epinephrine solution.  I used a scalpel to  ellipse out the patient's prior surgical scar and extended it in a caudal  direction.  I used electrocautery to perform a subperiosteal dissection  exposing the spinous process of L5 and the upper sacrum as well as exposing  the prior hardware from L3 down to L5.  I then used the screwdriver to  remove the caps on the old pedicle screws and to loosen the cross connector.  I then removed the old rods and cross connector.   I then used the Leksell rongeur to remove the L5 spinous process and part of  the L5 lamina.  I saved this bone and later cleared it of soft tissue to be  used in the fusion process.  I then used a high-speed drill to perform a  bilateral L5 laminotomy.  I then completed the L5 laminectomy using the  Kerrison punches and removed the L5-S1 ligamentum flavum and performed a  foraminotomy about the bilateral L5-S1 nerve roots.  In dissecting in a  stepwise direction, I encountered quite a bit  of scar tissue from the prior  surgery and I did have a small durotomy.  I reapproximated the dura using a  running 6-0 Prolene suture.  At this point the decompression was completed  at L5-S1.  I then freed up the thecal sac and the S1 nerve roots and gently  retracted it medially with a D'Errico retractor.  I then incised the L5-S1  intervertebral disk.  It was quite spondylotic.  I performed an aggressive  diskectomy using pituitary forceps.  I then placed a vertebral body spreader  in the contralateral disk space and distracted the interspace and then  cleared the vertebral end plates of soft tissue and then inserted an 8 x 26  mm Capstone PEEK cage and distracted the inferolateral disk space, of  course, after retracting the neural structures out of harm's way.  I then  drilled medially in the disk space with local morcellized autograft bone and  Actifuse and then after having  removed the interbody spreader, I then placed  a second Capstone cage on the contralateral side again measuring 8 x 26 mm.  I should mention these Capstone cages were prefilled with local morcellized  autograft bone and Actifuse bone graft substitute.  This completed the  posterior lumbar interbody fusion.   We now turned our attention to the instrumentation.  Under fluoroscopic  guidance, cannulated the bilateral S1 pedicles with the bone probe, tapped  the pedicles, probed inside the tapped pedicles to rule out cortical  breeches, I then inserted the 6.5 x 45 mm pedicle screw into the S1 pedicle  bilaterally under fluoroscopic guidance.  Then palpated along the medial  aspect of the S1 pedicles, noted that there were no cortical breeches and  the S1 nerve root was not injured.  I then cut a rod and bent it to the  appropriate lordosis and then connected the unilateral pedicle screws from  L3 down to S1 and secured the rod in place with the caps which I tightened  appropriately and then placed two cross connectors between the rods to  complete the instrumentation.   I now turned my attention to posterolateral arthrodesis.  I used a high  speed drill to decorticate the remainder of the L5-S1 facet joint,  the L5  lamina, the S1 lamina, the L5 pars region and the transverse processes  bilaterally at L5 and then laid the combination of local morcellized  autograft bone and Actifuse over the decorticated posterolateral structures  completing the posterolateral arthrodesis.   We then obtained stringent hemostasis using bipolar cautery.  I copiously  irrigated the wound out with bacitracin solution.  I then removed the  solution, and inspected the thecal sac and bilateral L5-S1 nerve roots and  noted that they were well decompressed.  I then injected Tisseel in the  epidural space over the durotomy.  I then removed the cerebellar retractors and then reapproximated the patient's thoracolumbar  fascia with interrupted  #1 Vicryl sutures, subcutaneous tissue with interrupted 2-0 Vicryl suture  and the skin with Steri-Strips and benzoin. The wound was then coated with  bacitracin ointment, sterile dressing was applied, the drapes were removed.  The patient was subsequently returned to supine position where she was  extubated by the anesthesia team and transported to the post anesthesia care  unit in stable condition.  All sponge, needle and instrument counts were  correct at the end of the case.      JDJ/MEDQ  D:  06/18/2004  T:  06/19/2004  Job:  [id number]

## 2010-08-31 NOTE — Op Note (Signed)
Leslie Hendricks, Leslie Hendricks              ACCOUNT NO.:  [id number]   MEDICAL RECORD NO.:  [id number]          PATIENT TYPE:  AMB   LOCATION:  ENDO                         FACILITY:  [facility]   PHYSICIAN:  James L. Malon Kindle., M.D.DATE OF BIRTH:  Nov 06, 1940   DATE OF PROCEDURE:  03/15/2004  DATE OF DISCHARGE:                                 OPERATIVE REPORT   REFERRING PHYSICIAN:  Gloriajean Dell. Andrey Campanile, M.D.   PROCEDURE:  Colonoscopy.   MEDICATIONS:  Not written down by the nursing staff, presumably in the  nurses' notes.   SCOPE:  Adult Olympus scope.   SURGEON:   INDICATIONS FOR PROCEDURE:  Colon cancer screening in a woman who has a  strong family history of colon cancer.   DESCRIPTION OF PROCEDURE:  The procedure had been explained to the patient  and consent obtained.  With the patient in the left lateral decubitus  position, the Olympus scope was inserted and advanced under direct  visualization.  The prep was excellent.  We were able to reach the cecum  without difficulty with the ileocecal valve and appendiceal orifice seen.  Scope withdrawn and the cecum, ascending colon, transverse colon, splenic  colon, splenic flexure, descending and sigmoid colon seen well and no polyps  seen.  No polyps, diverticulosis or any other abnormalities other than mild  melanosis coli.  The rectum was free of polyps.  The scope was withdrawn.  The patient tolerated the procedure well.   ASSESSMENT:  Normal colonoscopy with a strong family history of colon  cancer.   PLAN:  Will recommend yearly Hemoccults and repeat the procedure in five  years.       JLE/MEDQ  D:  03/15/2004  T:  03/15/2004  Job:  [id number]   cc:   Gloriajean Dell. Andrey Campanile, M.D.  [id number]  [place]  [place] [address]  Fax: [phone]

## 2010-08-31 NOTE — Discharge Summary (Signed)
   [name], Leslie Hendricks NO.:  [id number]   MEDICAL RECORD NO.:  [id number]                   PATIENT TYPE:  INP   LOCATION:  3034                                 FACILITY:  [facility]   PHYSICIAN:  Cristi Loron, M.D.            DATE OF BIRTH:  04/23/1940   DATE OF ADMISSION:  04/12/2002  DATE OF DISCHARGE:  04/16/2002                                 DISCHARGE SUMMARY   For full details of this admission please refer to typed history and  physical.   BRIEF HISTORY AND PHYSICAL:  The patient is a 69 year old white female who  has suffered from back and leg pain.  I performed an L4-5 posterior lumbar  fusion on her approximately 18 months ago.  She did well but then developed  recurrent back and leg pain.  I worked her up with a lumbar MRI which  demonstrated significant stenosis, degenerative disease and facet  arthropathy at L3-4.  I discussed the various treatment options with her  including surgery.  The patient weighed the risks, benefits and alternatives  to surgery and decided to proceed with the operation.   For further details of this admission please refer to typed history and  physical.   HOSPITAL COURSE:  I admitted the patient to [facility] on April 12, 2002.  On the day of admission I performed L3-4 fusion.  The surgery  went well, without complications.  For full details of this operation please refer to typed operative note.   Postoperative course:  The patient's postoperative course was essentially  unremarkable.  By April 16, 2002 the patient was afebrile, vital signs  stable, she was eating well, ambulating well.  Her wound was healing well,  without signs of infection and she was clear to be discharged to home.  I  therefore discharged her home on April 16, 2002.   DISCHARGE INSTRUCTIONS:  The patient was given discharge instructions to  follow up with me in four weeks.   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   PROCEDURE PERFORMED:  L3 laminectomy with L3-4 posterior lumbar fusion,  patient had bilateral tangent bone grafts, posterior segmental  instrumentation L3-L5 with CD horizon and 10 pedicle screws and rods,  posterior lateral arthrodesis at L3-4 with local autograft bone and VITOSS  bone scaffolding.                                               Cristi Loron, M.D.    JDJ/MEDQ  D:  05/13/2002  T:  05/14/2002  Job:  [id number]

## 2010-08-31 NOTE — Op Note (Signed)
[facility]  Patient:    Leslie Hendricks, Leslie Hendricks Visit Number: [id number] MRN: [id number]          Service Type: SUR Location: [id number] Attending Physician:  Cristi Loron Dictated by:   Cristi Loron, M.D. Proc. Date: 01/05/01 Admit Date:  01/05/2001                             Operative Report  HISTORY OF PRESENT ILLNESS:  The patient is a 70 year old white female who has suffered from back and bilateral leg pain.  She has failed medical management.  She was worked up with a lumbar MRI which demonstrated a grade one spinal listhesis at L4-5 with significant spinal stenosis.  I discussed the various treatment options with the patient, including surgery.  The patient weighed the risks, benefits, and alternatives to surgery.  At this time, proceed with a lumbar decompression and fusion.  PREOPERATIVE DIAGNOSES: 1. 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 2. Degenerative disc disease. 3. Spinal stenosis. 4. Lumbago. 5. Lumbar radiculopathy.  POSTOPERATIVE DIAGNOSES: 1. 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 2. Degenerative disc disease. 3. Spinal stenosis. 4. Lumbago. 5. Lumbar radiculopathy.  PROCEDURE: 1. L4-5 posterior lumbar interbody fusion. 2. Insertion of bilateral ______ cortical bone dowes. 3. Posterior non-segmental instrumentation with CD ______ ______ at L4-5    bilaterally. 4. Posterior lateral arthrodesis at L4-5 with local morcellized autograft bone    and detox bone substitute. 5. Bilateral L4 laminectomy to treat the patients spinal stenosis.  SURGEON:  Cristi Loron, M.D.  ASSISTANT:  Mena Goes. Franky Macho, M.D.  ANESTHESIA:  General endotracheal.  ESTIMATED BLOOD LOSS:  300 cc.  SPECIMENS:  None.  DRAINS:  None.  COMPLICATIONS:  None.  DESCRIPTION OF PROCEDURE:  The patient was brought to the operating room by the anesthesia team.  General endotracheal anesthesia was induced.  The patient was  turned to the prone position on the chest rolls.  Her lumbosacral region was then prepared with Betadine scrub solution.  Sterile drapes were applied.  I then injected the are to be incised with Marcaine with epinephrine solution.  I used a scalpel to make a linear midline incision over the L4-5 interspace.  I used electrocautery to dissect down to the thoracolumbar fascia, divided the fascia laterally, performing a subperiosteal dissection, stripping the paraspinous musculature of the spinous process of the lamina at L4-5.  I inserted the McCullough retractor for exposure.  I then obtained an intraoperative radiograph to confirm the location.  I then used the high speed drill to perform bilateral L4 laminotomy, and drilled away some of the soft tissue from the spinous process of the lamina of L4 bilaterally.  I then removed this L4 spinous process and part of the lamina using a Leksell rongeur, and saved this bone for later use during the fusion process.  I completed the L4 laminectomy with the Kerrison punch in order to treat the patients spinal stenosis.  There was considerable stenosis particularly in the lateral recesses.  I used a Kerrison punch to undercut the facets.  I decompressed the bilateral L4 and L5 nerve roots, as well as thecal sac.  Having completed the laminectomy, I now turned my attention to the arthrodesis.  I freed up the thecal sac and the bilateral L5 nerve root from the epidural tissue.  I then gently retracted the thecal sac medially with the ______ retractor, and performed a bilateral aggressive L4-5  diskectomy using the 15 blade scalpel, pituitary forceps, the ______ curettes.  I then prepared the vertebral end plates with the cutting curettes, as well as the chisel, and I distracted the interspace and decided to place a 10 x 24 mm tangent bone graft into the interspace.  I did this carefully by retracting the thecal sac and the L5 nerve root medially out of  harms way, and then tapped the bone graft into place under direct visualization.  This was done bilaterally.  Having completed the posterior lumbar interbody fusion, I now turned my attention to the posterior non-segmental instrumentation.  I used electrocautery to expose the bilateral L4-5 transverse processes, and then under fluoroscopic guidance I decorticated to the posterior bilateral L4-5 pedicles with the high speed drill.  I then cannulated the pedicles with the pedicle probe, and then inserted a 6.75 x 50 mm pedicle screw bilaterally at L4, and a 6.75 x 45 mm pedicle screw bilaterally at L5.  This was done after tapping the pedicle, and under fluoroscopic guidance I also palpated along the interior of the tapped pedicle, as well as exterior of the pedicles after the pedicle screws were in, and noted that the screws were in good position, and neither L4 or L5 nerve roots were injured.  I then connected the unilateral pedicle screws with the appropriate length rod, and secured the rod in place with the appropriate caps which were tightened appropriately.  Having completed the posterior non-segmental instrumentation, I now turned my attention to the posterolateral arthrodesis.  I used the high speed drill to decorticate the remainder of the L4-5 facet and L4 pars region, as well as the L5 lamina at the bilateral L4-5 transverse processes.  I laid a combination of ______  bone substitute, as well as local morcellized autograft bone over the decorticated posterolateral bony structures to complete the arthrodesis.  (Of note, I did put ______ bone substitutes in between and around the tangent bone grafts in the interspace also).  I now achieved stringent hemostasis using bipolar electrocautery and Gelfoam. I then inspected the thecal sac and the bilateral L4-5 nerve roots, and noted that they were all decompressed.  I copiously irrigated the wound with Bacitracin solution, removed the  solution, and the removed the McCullough retractor, reapproximated the patients thoracolumbar fascia with interrupted  Vicryl sutures, subcutaneous tissue with interrupted 2-0 Vicryl suture, and the skin with Steri-Strips and Benzoin.  The wound was then coated with Bacitracin ointment, a sterile dressing was applied.  The drapes were removed. The patient was subsequently returned to the supine position where she was extubated by the anesthesia team and transported to the post-anesthesia care unit in stable condition.  All sponge, needle, and instrument counts were correct at the end of the case. Dictated by:   Cristi Loron, M.D. Attending Physician:  Tressie Stalker D DD:  01/05/01 TD:  01/06/01 Job: [id number]

## 2010-08-31 NOTE — Discharge Summary (Signed)
NAMEKHALIS, HITTLE NO.:  [id number]   MEDICAL RECORD NO.:  [id number]          PATIENT TYPE:  INP   LOCATION:  3029                         FACILITY:  [facility]   PHYSICIAN:  Cristi Loron, M.D.DATE OF BIRTH:  22-Sep-1940   DATE OF ADMISSION:  06/18/2004  DATE OF DISCHARGE:  06/23/2004                                 DISCHARGE SUMMARY   BRIEF HISTORY:  The patient is a 70 year old white female who has undergone  two prior lumbar fusions.  She developed recurrent neurogenic claudication  and worsening back pain and was worked up with a lumbar MRI which  demonstrated L5-S1 had significantly degenerated.  She failed medical  management.  I have discussed the various treatment options with her,  including surgery.  The patient weighed the risks, benefits, and  alternatives to surgery and decided to proceed with an L5-S1 fusion.   For further details of this admission, please refer to the typed history and  physical.   HOSPITAL COURSE:  I admitted the patient to [facility] on June 18, 2004 and on the day of admission performed an L5-S1 fusion.  The surgery  went well, without complications (for full details of this admission, please  refer to the typed operative note).   Postoperative course:  The patient's postoperative course was as follows.  She was initially obtained on a PCA pump.  This was discontinued on postop  day two.  On postop day two, the patient did develop some chest burning.  We had cardiology see the patient, and they recommended a stress test.  This  was done.  It turned out okay.   The remainder of the patient's postoperative course was unremarkable.  By  June 23, 2004, the patient was afebrile.  Vital signs stable.  She was  eating well, ambulating well, and she was requesting discharge home.  She  was therefore discharged home.   FINAL DIAGNOSIS:  L5-S1 degenerative disease, lumbago.   PROCEDURE PERFORMED:  L5-S1 posterior  lumbar interbody fusion; insertion of  bilateral L5-S1 interbody prosthesis (Capstone PEEK cages); L5-S1  posterolateral arthrodesis, with local morselized autograft bone, and  Actifuse bone graft substitute; posterior segmental instrumentation L3-S1,  with Legacy and CD Horizon titanium pedicle screws and rods.   DISCHARGE INSTRUCTIONS:  The patient was given written discharge  instructions.  Instructed to follow up with me in four weeks.  Resume her  outpatient medical regimen.      JDJ/MEDQ  D:  08/16/2004  T:  08/16/2004  Job:  [id number]

## 2010-08-31 NOTE — Op Note (Signed)
Leslie Hendricks, Leslie Hendricks NO.:  [id number]   MEDICAL RECORD NO.:  [id number]                   PATIENT TYPE:  INP   LOCATION:  3034                                 FACILITY:  [facility]   PHYSICIAN:  Cristi Loron, M.D.            DATE OF BIRTH:  01-10-1941   DATE OF PROCEDURE:  04/12/2002  DATE OF DISCHARGE:                                 OPERATIVE REPORT   PREOPERATIVE DIAGNOSES:  1. L3-4 degenerative disk disease.  2. Stenosis.  3. Lumbago.  4. Lumbar radiculopathy.   POSTOPERATIVE DIAGNOSES:  1. L3-4 degenerative disk disease.  2. Stenosis.  3. Lumbago.  4. Lumbar radiculopathy.   PROCEDURES:  1. L3 laminectomy.  2. L3-L4 posterior lumbar interbody fusion.  3. Placement of bilateral tangent interbody bone grafts.  4. Posterior segmental instrumentation L3-L5 with M10 pedicle screws and     insertion of rods.  5. Posterior arthrodesis at L3-4 with local morselized autograft bone and V-     toss bone scapulae.   SURGEON:  Cristi Loron, M.D.   ASSISTANT:  Stefani Dama, M.D.   ANESTHESIA:  General endotracheal.   ESTIMATED BLOOD LOSS:  300 cc.   SPECIMENS:  None.   DRAINS:  None.   COMPLICATIONS:  None.   INDICATIONS:  The patient is a 70 year old white female who has suffered  from back and leg pain.  I performed a L4-5 posterior lumbar interbody  fusion on her approximately 18 months ago.  She initially did well but then  developed recurrent back and leg pain.  She was worked up with a lumbar MRI  which demonstrated significant stenosis, degenerative disk disease, and  facet arthropathy at L3-L4.  I discussed the various treatment options with  her including surgery.  The patient weighed the risks, benefits and  alternatives to surgery and decided to proceed with the operation.   DESCRIPTION OF PROCEDURE:  The patient was brought to the operating room by  the anesthesia team.  General endotracheal anesthesia was used.   The patient  was then turned to the prone position on the chest rolls.  Her lumbosacral  region was then prepared with Betadine scrub and Betadine solution.  Sterile  drapes were applied.   I injected the area to be excised with Marcaine with epinephrine solution,  used the scalpel to make a linear midline incision over the L3-4 interspace  as well as ellipsed out the patient's prior surgical scar using the scalpel.  I used electrocautery to dissect down to the thoracolumbar fascia and  divided the thoracolumbar fascia bilaterally.  I performed a bilateral  subperiosteal dissection, stripping the paraspinous musculature from the  bilateral spinous process and lamina of L3 exposing the L3-L4 facet joints  as well as the previously placed instrumentation at L4-5.  I inserted the  McCullough self-retaining retractor for exposure and then obtained an  intraoperative radiograph to confirm my  location.   I then incised the intraspinous ligament at L2-3 and L3-4 using the scalpel  and then used the Leksell rongeur to remove the spinous process and part of  the lamina of L3.  This bone was saved and, after being cleaned of the soft  tissue, was later used in the fusion process.  We then performed L5  laminotomies using the high speed drill and then completed the L3  laminectomy using Kerrison punch, removing the ligament flavum at L2-3 as  well as dissecting through the abundant scar tissue at L3-4, decompressing  the thecal sac.   We performed a foraminotomy about the bilateral L3 and L4 nerve roots using  Kerrison punch.  We then freed up the thecal sac and the L4 nerve root and  carefully retracted neural structures medially and incised the  L3-L4  intervertebral disk and performed a bilateral aggressive diskectomy using  the pituitary forceps and the Epstein and Scoville curets.  We then prepared  for the posterior lumbar interbody fusion using the rotating cutters and the  chisel to  prepare the vertebral endplates bilaterally at L3-4, of course,  after retracting neural structures out of harm's way.  We then inserted a 10  x 26 mm tangent bone graft bilaterally into the L3-4 interspace after  retracting neural structures out of the way with the D'Errico retractor.   We filled in between the bone grafts and lateral to the bone grafts with a  combination of morselized autograft bone and V-toss bone scaffolding  completing the posterior lumbar interbody fusion.   We now turned our attention to the posterior instrumentation.  We used  electrocautery to expose the bilateral transverse process of L3 and then,  under fluoroscopic guidance, decorticated portions of bilateral L3 pedicles,  cannulated the pedicles with blunt probes, and then capped the pedicle with  a 5.5 mm cap and then probed the interior of the capped pedicle with the  ball probe and noted that there are no cortical bridges. We then inserted a  6.5 x 50 mm pedicle bilaterally at L3 under fluoroscopic guidance and then  palpated along the medial surface of the pedicle and noted that the L3 nerve  root was not injured in any way and there was no cortical breaches.   We then removed the screws from the top of the preexisting pedicle screws at  L4 and L5 and them removed them bilaterally.  We then cut a longer rod to  connect all three pedicle screws, unilaterally; i.e., the L3, 4, and 5  pedicle screws.  We labeled all on the plates, the rod then placed with the  nuts and then appropriately tightened the nuts with the torque wrench after  placing the construct at L3-L4 under compression.  We then placed a cross-  clamp between the two rods to connect the rods and secured it appropriately  completing the posterior segmental instrumentation.   We then turned our attention to the posterior lateral arthrodesis.  We used  a high speed drill to decorticate the facet in the L3-4 facet in pars and L3- 4 bilaterally,  the L3 transverse process and then made a combination of  local morselized autograft bone and V-toss bone scaffolding over the  decorticated posterior structures.  We also removed the synovium of the L3-4  facet using high speed drill completing the posterior lateral arthrodesis.   We then inspected the dura and the bilateral L3 and L4 nerve roots for any  residual  compression.  There was none.  We then achieved stringent  hemostasis using bipolar electrocautery and then removed the [facility]  retractor.  We then reapproximated the thoracolumbar fascia with interrupted  #1 Vicryl suture, subcutaneous tissue with 2-0 Vicryl suture and the skin  with Steri-Strips and Benzoin.  The wound was then covered with Bacitracin  ointment and sterile dressings applied.  The drapes were removed.   The patient was subsequently returned to the supine position where she was  extubated by the ICU team and transported to the postanesthesia care unit in  stable condition.  All sponge, instrument and needle counts were correct at  the end of this case.  I should note that in completing the laminectomy of  L3, I also removed the medial aspect of the L3-L4 facet joint and noted that  there was bilateral synovial cysts decompressing the thecal sac and the L4  nerve root and the thecal sac distal to the L4 nerve root by removing the  atrophied and degenerative facet joints and the epidural scarring.                                               Cristi Loron, M.D.    JDJ/MEDQ  D:  04/12/2002  T:  04/13/2002  Job:  [id number]

## 2010-09-27 DIAGNOSIS — I69993 Ataxia following unspecified cerebrovascular disease: Secondary | ICD-10-CM | POA: Insufficient documentation

## 2011-01-01 ENCOUNTER — Other Ambulatory Visit: Payer: Self-pay | Admitting: Neurosurgery

## 2011-01-01 DIAGNOSIS — M549 Dorsalgia, unspecified: Secondary | ICD-10-CM

## 2011-01-07 LAB — CBC
HCT: 39.3
Hemoglobin: 13.5
MCHC: 34.3
MCV: 96
Platelets: 223
RBC: 4.09
RDW: 13.4
WBC: 6.7

## 2011-01-07 LAB — BASIC METABOLIC PANEL
BUN: 13
CO2: 32
Calcium: 10
Chloride: 101
Creatinine, Ser: 0.77
GFR calc Af Amer: >60
GFR calc non Af Amer: >60
Glucose, Bld: 86
Potassium: 4.2
Sodium: 142

## 2011-01-08 ENCOUNTER — Ambulatory Visit
Admission: RE | Admit: 2011-01-08 | Discharge: 2011-01-08 | Disposition: A | Discharge status: Home / Self Care | Payer: Medicare Other | Source: Ambulatory Visit | Attending: Neurosurgery | Admitting: Neurosurgery

## 2011-01-08 DIAGNOSIS — M549 Dorsalgia, unspecified: Secondary | ICD-10-CM

## 2011-01-08 MED ORDER — GADOBENATE DIMEGLUMINE 529 MG/ML IV SOLN
20.0000 mL | Freq: Once | INTRAVENOUS | Status: AC | PRN
  Administered 2011-01-08: 20 mL via INTRAVENOUS

## 2011-01-24 ENCOUNTER — Other Ambulatory Visit (HOSPITAL_COMMUNITY): Payer: Self-pay | Admitting: Neurosurgery

## 2011-01-24 DIAGNOSIS — M542 Cervicalgia: Secondary | ICD-10-CM

## 2011-01-24 DIAGNOSIS — M5416 Radiculopathy, lumbar region: Secondary | ICD-10-CM

## 2011-01-28 LAB — BASIC METABOLIC PANEL
BUN: 12
BUN: 8
CO2: 30
CO2: 32
Calcium: 8.5
Calcium: 8.5
Chloride: 101
Chloride: 98
Creatinine, Ser: 0.71
Creatinine, Ser: 0.74
GFR calc Af Amer: >60
GFR calc Af Amer: >60
GFR calc non Af Amer: >60
GFR calc non Af Amer: >60
Glucose, Bld: 100 — ABNORMAL HIGH
Glucose, Bld: 136 — ABNORMAL HIGH
Potassium: 3.7
Potassium: 4
Sodium: 135
Sodium: 136

## 2011-01-28 LAB — PROTIME-INR
INR: 1
INR: 1.6 — ABNORMAL HIGH
INR: 2.1 — ABNORMAL HIGH
INR: 2.4 — ABNORMAL HIGH
INR: 2.6 — ABNORMAL HIGH
INR: 2.6 — ABNORMAL HIGH
Prothrombin Time: 12.8
Prothrombin Time: 19.4 — ABNORMAL HIGH
Prothrombin Time: 24.2 — ABNORMAL HIGH
Prothrombin Time: 27.8 — ABNORMAL HIGH
Prothrombin Time: 29.5 — ABNORMAL HIGH
Prothrombin Time: 29.5 — ABNORMAL HIGH

## 2011-01-28 LAB — CBC
HCT: 28.2 — ABNORMAL LOW
HCT: 29.9 — ABNORMAL LOW
HCT: 33.9 — ABNORMAL LOW
Hemoglobin: 10.3 — ABNORMAL LOW
Hemoglobin: 11.5 — ABNORMAL LOW
Hemoglobin: 9.6 — ABNORMAL LOW
MCHC: 33.8
MCHC: 34.1
MCHC: 34.4
MCV: 97.8
MCV: 98
MCV: 98.8
Platelets: 187
Platelets: 197
Platelets: 217
RBC: 2.88 — ABNORMAL LOW
RBC: 3.05 — ABNORMAL LOW
RBC: 3.43 — ABNORMAL LOW
RDW: 12.6
RDW: 12.6
RDW: 12.7
WBC: 11.5 — ABNORMAL HIGH
WBC: 8.4
WBC: 9.2

## 2011-01-29 LAB — DIFFERENTIAL
Basophils Absolute: 0
Basophils Relative: 1
Eosinophils Absolute: 0.2
Eosinophils Relative: 3
Lymphocytes Relative: 38
Lymphs Abs: 2.5
Monocytes Absolute: 0.7
Monocytes Relative: 11
Neutro Abs: 3.2
Neutrophils Relative %: 49

## 2011-01-29 LAB — CBC
HCT: 40.2
Hemoglobin: 13.6
MCHC: 33.9
MCV: 97.4
Platelets: 264
RBC: 4.12
RDW: 12.4
WBC: 6.6

## 2011-01-29 LAB — URINE CULTURE: Colony Count: 9000

## 2011-01-29 LAB — APTT: aPTT: 27

## 2011-01-29 LAB — COMPREHENSIVE METABOLIC PANEL
ALT: 28
AST: 30
Albumin: 3.8
Alkaline Phosphatase: 61
BUN: 17
CO2: 28
Calcium: 9.9
Chloride: 101
Creatinine, Ser: 0.78
GFR calc Af Amer: >60
GFR calc non Af Amer: >60
Glucose, Bld: 107 — ABNORMAL HIGH
Potassium: 4.3
Sodium: 139
Total Bilirubin: 0.6
Total Protein: 6.7

## 2011-01-29 LAB — URINALYSIS, ROUTINE W REFLEX MICROSCOPIC
Bilirubin Urine: NEGATIVE
Glucose, UA: NEGATIVE
Hgb urine dipstick: NEGATIVE
Ketones, ur: NEGATIVE
Nitrite: NEGATIVE
Protein, ur: NEGATIVE
Specific Gravity, Urine: 1.02
Urobilinogen, UA: 0.2
pH: 6

## 2011-01-29 LAB — ABO/RH: ABO/RH(D): O POS

## 2011-01-29 LAB — TYPE AND SCREEN
ABO/RH(D): O POS
Antibody Screen: NEGATIVE

## 2011-01-29 LAB — PROTIME-INR
INR: 0.9
Prothrombin Time: 12.7

## 2011-02-16 ENCOUNTER — Emergency Department (HOSPITAL_COMMUNITY): Payer: Medicare Other

## 2011-02-16 ENCOUNTER — Emergency Department (HOSPITAL_COMMUNITY)
Admission: EM | Admit: 2011-02-16 | Discharge: 2011-02-16 | Disposition: A | Discharge status: Home / Self Care | Payer: Medicare Other | Attending: Emergency Medicine | Admitting: Emergency Medicine

## 2011-02-16 DIAGNOSIS — Y92009 Unspecified place in unspecified non-institutional (private) residence as the place of occurrence of the external cause: Secondary | ICD-10-CM | POA: Insufficient documentation

## 2011-02-16 DIAGNOSIS — Z79899 Other long term (current) drug therapy: Secondary | ICD-10-CM | POA: Insufficient documentation

## 2011-02-16 DIAGNOSIS — E119 Type 2 diabetes mellitus without complications: Secondary | ICD-10-CM | POA: Insufficient documentation

## 2011-02-16 DIAGNOSIS — M545 Low back pain, unspecified: Secondary | ICD-10-CM | POA: Insufficient documentation

## 2011-02-16 DIAGNOSIS — F411 Generalized anxiety disorder: Secondary | ICD-10-CM | POA: Insufficient documentation

## 2011-02-16 DIAGNOSIS — R51 Headache: Secondary | ICD-10-CM | POA: Insufficient documentation

## 2011-02-16 DIAGNOSIS — E785 Hyperlipidemia, unspecified: Secondary | ICD-10-CM | POA: Insufficient documentation

## 2011-02-16 DIAGNOSIS — M25559 Pain in unspecified hip: Secondary | ICD-10-CM | POA: Insufficient documentation

## 2011-02-16 DIAGNOSIS — W010XXA Fall on same level from slipping, tripping and stumbling without subsequent striking against object, initial encounter: Secondary | ICD-10-CM | POA: Insufficient documentation

## 2011-02-16 DIAGNOSIS — M25569 Pain in unspecified knee: Secondary | ICD-10-CM | POA: Insufficient documentation

## 2011-02-16 DIAGNOSIS — I10 Essential (primary) hypertension: Secondary | ICD-10-CM | POA: Insufficient documentation

## 2011-02-16 DIAGNOSIS — M810 Age-related osteoporosis without current pathological fracture: Secondary | ICD-10-CM | POA: Insufficient documentation

## 2011-03-04 ENCOUNTER — Ambulatory Visit: Payer: Medicare Other | Admitting: Physical Therapy

## 2011-03-05 ENCOUNTER — Ambulatory Visit: Payer: Medicare Other | Attending: Anesthesiology | Admitting: Physical Therapy

## 2011-03-05 DIAGNOSIS — IMO0001 Reserved for inherently not codable concepts without codable children: Secondary | ICD-10-CM | POA: Insufficient documentation

## 2011-03-05 DIAGNOSIS — Z96659 Presence of unspecified artificial knee joint: Secondary | ICD-10-CM | POA: Insufficient documentation

## 2011-03-05 DIAGNOSIS — R279 Unspecified lack of coordination: Secondary | ICD-10-CM | POA: Insufficient documentation

## 2011-03-11 ENCOUNTER — Other Ambulatory Visit: Payer: Self-pay | Admitting: Neurosurgery

## 2011-03-11 ENCOUNTER — Ambulatory Visit: Payer: Medicare Other | Admitting: Physical Therapy

## 2011-03-12 ENCOUNTER — Ambulatory Visit (HOSPITAL_COMMUNITY)
Admission: RE | Admit: 2011-03-12 | Discharge: 2011-03-12 | Disposition: A | Discharge status: Home / Self Care | Payer: Medicare Other | Source: Ambulatory Visit | Attending: Neurosurgery | Admitting: Neurosurgery

## 2011-03-12 ENCOUNTER — Other Ambulatory Visit: Payer: Self-pay | Admitting: Neurosurgery

## 2011-03-12 DIAGNOSIS — M542 Cervicalgia: Secondary | ICD-10-CM | POA: Insufficient documentation

## 2011-03-12 DIAGNOSIS — Z981 Arthrodesis status: Secondary | ICD-10-CM | POA: Insufficient documentation

## 2011-03-12 DIAGNOSIS — M549 Dorsalgia, unspecified: Secondary | ICD-10-CM | POA: Insufficient documentation

## 2011-03-12 DIAGNOSIS — M4802 Spinal stenosis, cervical region: Secondary | ICD-10-CM | POA: Insufficient documentation

## 2011-03-12 DIAGNOSIS — M48061 Spinal stenosis, 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 neurogenic claudication: Secondary | ICD-10-CM | POA: Insufficient documentation

## 2011-03-12 DIAGNOSIS — M5416 Radiculopathy, lumbar region: Secondary | ICD-10-CM

## 2011-03-12 LAB — GLUCOSE, CAPILLARY: Glucose-Capillary: 104 mg/dL — ABNORMAL HIGH (ref 70–99)

## 2011-03-12 MED ORDER — DIAZEPAM 5 MG PO TABS
10.0000 mg | ORAL_TABLET | Freq: Once | ORAL | Status: AC
  Administered 2011-03-12: 10 mg via ORAL

## 2011-03-12 MED ORDER — HYDROCODONE-ACETAMINOPHEN 10-325 MG PO TABS
1.0000 | ORAL_TABLET | ORAL | Status: AC
  Administered 2011-03-12: 1 via ORAL
  Filled 2011-03-12: qty 1

## 2011-03-12 MED ORDER — ONDANSETRON HCL 4 MG/2ML IJ SOLN: 4.0000 mg | Freq: Four times a day (QID) | INTRAMUSCULAR | Status: DC | PRN

## 2011-03-12 MED ORDER — IOHEXOL 300 MG/ML  SOLN
10.0000 mL | Freq: Once | INTRAMUSCULAR | Status: AC | PRN
  Administered 2011-03-12: 10 mL via INTRATHECAL

## 2011-03-12 MED ORDER — HYDROCODONE-ACETAMINOPHEN 5-325 MG PO TABS
ORAL_TABLET | ORAL | Status: AC
  Filled 2011-03-12: qty 1

## 2011-03-12 NOTE — Progress Notes (Signed)
Discharge instructions re post myelogram reviewed with patient and friend. Verbalized understanding. Given handwritten instructions re same. Discharged in w/c with RN to be driven home by friend.

## 2011-03-12 NOTE — Progress Notes (Signed)
Brought to nurses station to be discharged. Unable to print discharge instructions due to Dr. Lovell Sheehan not being able to get into her chart to reconcile her meds. Spoke with Dr. Lovell Sheehan and he stated she could be discharged at 10:30, but he cannot reconcile her meds. Therefore I am unable to print her AVS and discharge instructions. Patient getting dressed.

## 2011-03-14 ENCOUNTER — Ambulatory Visit: Payer: Medicare Other | Admitting: Physical Therapy

## 2011-03-19 ENCOUNTER — Ambulatory Visit: Payer: Medicare Other | Attending: Anesthesiology | Admitting: *Deleted

## 2011-03-19 DIAGNOSIS — R279 Unspecified lack of coordination: Secondary | ICD-10-CM | POA: Insufficient documentation

## 2011-03-19 DIAGNOSIS — Z96659 Presence of unspecified artificial knee joint: Secondary | ICD-10-CM | POA: Insufficient documentation

## 2011-03-19 DIAGNOSIS — IMO0001 Reserved for inherently not codable concepts without codable children: Secondary | ICD-10-CM | POA: Insufficient documentation

## 2011-03-21 ENCOUNTER — Ambulatory Visit: Payer: Medicare Other | Admitting: *Deleted

## 2011-03-21 ENCOUNTER — Other Ambulatory Visit: Payer: Self-pay | Admitting: Neurosurgery

## 2011-03-25 ENCOUNTER — Ambulatory Visit: Payer: Medicare Other | Admitting: Physical Therapy

## 2011-03-27 ENCOUNTER — Ambulatory Visit: Payer: Medicare Other | Admitting: Physical Therapy

## 2011-04-02 ENCOUNTER — Ambulatory Visit: Payer: Medicare Other | Admitting: Physical Therapy

## 2011-04-04 ENCOUNTER — Ambulatory Visit: Payer: Medicare Other | Admitting: Physical Therapy

## 2011-04-11 ENCOUNTER — Ambulatory Visit: Payer: Medicare Other | Admitting: Physical Therapy

## 2011-04-14 ENCOUNTER — Emergency Department (HOSPITAL_COMMUNITY)
Admission: EM | Admit: 2011-04-14 | Discharge: 2011-04-15 | Disposition: A | Discharge status: Home / Self Care | Payer: Medicare Other | Attending: Emergency Medicine | Admitting: Emergency Medicine

## 2011-04-14 ENCOUNTER — Emergency Department (HOSPITAL_COMMUNITY): Payer: Medicare Other

## 2011-04-14 DIAGNOSIS — M545 Low back pain, unspecified: Secondary | ICD-10-CM | POA: Insufficient documentation

## 2011-04-14 DIAGNOSIS — Z7982 Long term (current) use of aspirin: Secondary | ICD-10-CM | POA: Insufficient documentation

## 2011-04-14 DIAGNOSIS — H547 Unspecified visual loss: Secondary | ICD-10-CM | POA: Insufficient documentation

## 2011-04-14 DIAGNOSIS — E119 Type 2 diabetes mellitus without complications: Secondary | ICD-10-CM | POA: Insufficient documentation

## 2011-04-14 DIAGNOSIS — E669 Obesity, unspecified: Secondary | ICD-10-CM | POA: Insufficient documentation

## 2011-04-14 DIAGNOSIS — R51 Headache: Secondary | ICD-10-CM | POA: Insufficient documentation

## 2011-04-14 DIAGNOSIS — Z9889 Other specified postprocedural states: Secondary | ICD-10-CM | POA: Insufficient documentation

## 2011-04-14 DIAGNOSIS — S0003XA Contusion of scalp, initial encounter: Secondary | ICD-10-CM | POA: Insufficient documentation

## 2011-04-14 DIAGNOSIS — M542 Cervicalgia: Secondary | ICD-10-CM | POA: Insufficient documentation

## 2011-04-14 DIAGNOSIS — Z859 Personal history of malignant neoplasm, unspecified: Secondary | ICD-10-CM | POA: Insufficient documentation

## 2011-04-14 DIAGNOSIS — W19XXXA Unspecified fall, initial encounter: Secondary | ICD-10-CM

## 2011-04-14 DIAGNOSIS — I10 Essential (primary) hypertension: Secondary | ICD-10-CM | POA: Insufficient documentation

## 2011-04-14 DIAGNOSIS — W010XXA Fall on same level from slipping, tripping and stumbling without subsequent striking against object, initial encounter: Secondary | ICD-10-CM | POA: Insufficient documentation

## 2011-04-14 DIAGNOSIS — R269 Unspecified abnormalities of gait and mobility: Secondary | ICD-10-CM | POA: Insufficient documentation

## 2011-04-14 DIAGNOSIS — M25559 Pain in unspecified hip: Secondary | ICD-10-CM | POA: Insufficient documentation

## 2011-04-14 DIAGNOSIS — Z79899 Other long term (current) drug therapy: Secondary | ICD-10-CM | POA: Insufficient documentation

## 2011-04-14 HISTORY — DX: Essential (primary) hypertension: I10

## 2011-04-14 HISTORY — DX: Malignant (primary) neoplasm, unspecified: C80.1

## 2011-04-14 HISTORY — DX: Malignant neoplasm of uterus, part unspecified: C55

## 2011-04-14 MED ORDER — OXYCODONE-ACETAMINOPHEN 5-325 MG PO TABS
2.0000 | ORAL_TABLET | Freq: Once | ORAL | Status: AC
  Administered 2011-04-14: 2 via ORAL
  Filled 2011-04-14 (×2): qty 1

## 2011-04-14 NOTE — ED Provider Notes (Signed)
History     CSN: [id number]  Arrival date & time 04/14/11  2202   First MD Initiated Contact with Patient 04/14/11 2237      Chief Complaint  Patient presents with  . Fall    (Consider location/radiation/quality/duration/timing/severity/associated sxs/prior treatment) HPI  Past Medical History  Diagnosis Date  . Diabetes mellitus   . Hypertension   . Cancer   . Uterine cancer     Past Surgical History  Procedure Date  . Back surgery   . Abdominal hysterectomy   . Foot surgery     left toe  . Joint replacement   . Neck surgery   . Nose surgery     History reviewed. No pertinent family history.  History  Substance Use Topics  . Smoking status: Former Games developer  . Smokeless tobacco: Not on file  . Alcohol Use: No    OB History    Grav Para Term Preterm Abortions TAB SAB Ect Mult Living                  Review of Systems  Allergies  Banana; Codeine; and Oxycontin  Home Medications   Current Outpatient Rx  Name Route Sig Dispense Refill  . VITAMIN C-ACEROLA 500 MG PO TABS Oral Take 500 mg by mouth daily.      . ASPIRIN 325 MG PO TABS Oral Take 325 mg by mouth daily.      . ATORVASTATIN CALCIUM 80 MG PO TABS Oral Take 80 mg by mouth daily.      . CHOLECALCIFEROL 400 UNITS PO TABS Oral Take 400 Units by mouth daily.      Marland Kitchen DIAZEPAM 5 MG PO TABS Oral Take 10 mg by mouth at bedtime.      . OMEGA-3 FATTY ACIDS 1000 MG PO CAPS Oral Take 1 g by mouth daily.      Marland Kitchen FOLIC ACID 400 MCG PO TABS Oral Take 400 mcg by mouth daily.      Marland Kitchen HYDROCHLOROTHIAZIDE 25 MG PO TABS Oral Take 25 mg by mouth daily.      Marland Kitchen HYDROCODONE-ACETAMINOPHEN 10-325 MG PO TABS Oral Take 0.5-1 tablets by mouth every 6 (six) hours as needed. PAIN     . METFORMIN HCL ER (MOD) 500 MG PO TB24 Oral Take 500 mg by mouth daily with breakfast.      . MOXIFLOXACIN HCL 0.5 % OP SOLN Left Eye Place 1 drop into the left eye 3 (three) times daily. DAY OF AND 2 DAYS AFTER MONTHLY EYE INJECTION     .  OCUVITE-LUTEIN PO CAPS Oral Take 1 capsule by mouth daily.      Marland Kitchen OVER THE COUNTER MEDICATION Oral Take 2 tablets by mouth daily. "EQUATE" STIMULANT LAXATIVE FOR CONSTIPATION     . OVER THE COUNTER MEDICATION Oral Take 1 tablet by mouth daily. MAGNESIUM 250MG      . TELMISARTAN-HCTZ 80-12.5 MG PO TABS Oral Take 1 tablet by mouth daily.      . VENLAFAXINE HCL 75 MG PO CP24 Oral Take 75 mg by mouth daily.      Marland Kitchen VITAMIN E 400 UNITS PO CAPS Oral Take 400 Units by mouth daily.      Marland Kitchen ZINC GLUCONATE 50 MG PO TABS Oral Take 50 mg by mouth daily.        BP 155/62  Pulse 63  Temp(Src) 97.4 F (36.3 C) (Oral)  Resp 18  SpO2 98%  Physical Exam  ED Course  Procedures (including critical care  time)  Labs Reviewed - No data to display No results found.   No diagnosis found.    MDM  Medical screening exam; seen by me on arrival patient slipped and fell while she was walking taking the trash out. Complains of pain at low back radiating to both hips. Complains of neck pain posteriorly and occipital headache after striking her occiput on the pavement. No loss of conscious Treated by EMS with long board hard collar and CID On exam appears uncomfortable Glasgow Coma Score 15 HEENT exam tender occiput otherwise atraumatic neck trachea midline, tender cervical spine Lungs clear auscultation abdomen obese nontender lumbar spine is tender thoracic spine nontender pelvis is tender diffusely no crepitance Neurologic moves all extremities cranial nerves II through XII grossly intact        Doug Sou, MD 04/14/11 2239

## 2011-04-14 NOTE — ED Notes (Signed)
[id number]<AV> Expected date:04/14/11<BR> Expected time: 9:50 PM<BR> Means of arrival:Ambulance<BR> Comments:<BR> ROCK 5 - 70yoF Pain all over after fall

## 2011-04-14 NOTE — ED Notes (Signed)
Per EMS, pt from home.  Pt was stepping off curb and fell.  Fall unwitnessed.  No LOC noted.  Pt c/o pain all over.  Pain 8/10.  No deformities noted.  No head bump noted.  Vitals, 134/90, hr 70, resp 20, cbg 158. Pt is back board and c-collar.  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

## 2011-04-14 NOTE — ED Notes (Signed)
Patient transported to CT 

## 2011-04-15 NOTE — ED Notes (Signed)
Ambulated pt to the bathroom with moderate assistance and pt states she feel "swimmy headed and really dizzy" once she was getting off the commode and walking back to her room. Notified RN and PA Lorenz Coaster

## 2011-04-15 NOTE — ED Provider Notes (Signed)
History     CSN: [id number]  Arrival date & time 04/14/11  2202   First MD Initiated Contact with Patient 04/14/11 2237      Chief Complaint  Patient presents with  . Fall    (Consider location/radiation/quality/duration/timing/severity/associated sxs/prior treatment) Patient is a 70 y.o. female presenting with fall. The history is provided by the patient.  Fall Incident onset: just PTA. The fall occurred while walking. Distance fallen: from standing. She landed on concrete. There was no blood loss. The point of impact was the head. The pain is present in the head. The pain is moderate. She was not ambulatory at the scene. Associated symptoms include headaches. Pertinent negatives include no visual change, no fever, no numbness, no abdominal pain, no bowel incontinence, no nausea, no vomiting, no hematuria and no hearing loss. Treatment on scene includes a c-collar and a backboard. She has tried nothing for the symptoms.  Pt with mechanical fall in which she tripped over curb after taking out her trash this evening. She typically walks with a walker but was not using this at the time of the fall. She reports that she hit her head does not think she lost consciousness. 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 She also reports a fall 2 days ago where she was again not using her walker and tripped. From the prior fall, she complains of pain to bilateral legs. Patient does note that she has a history of chronic neck and back pain which are unchanged after each of these recent falls.  Past Medical History  Diagnosis Date  . Diabetes mellitus   . Hypertension   . Cancer   . Uterine cancer dx'd 1991    surg only    Past Surgical History  Procedure Date  . Back surgery   . Abdominal hysterectomy   . Foot surgery     left toe  . Joint replacement   . Neck surgery   . Nose surgery     History reviewed. No pertinent family history.  History    Substance Use Topics  . Smoking status: Former Games developer  . Smokeless tobacco: Not on file  . Alcohol Use: No      Review of Systems  Constitutional: Negative for fever and chills.  HENT: Positive for neck pain. Negative for ear pain, nosebleeds, congestion, neck stiffness and tinnitus.   Eyes: Negative for pain and visual disturbance.  Respiratory: Negative for cough, chest tightness and shortness of breath.   Cardiovascular: Negative for chest pain and palpitations.  Gastrointestinal: Negative for nausea, vomiting, abdominal pain and bowel incontinence.  Genitourinary: Negative for hematuria, flank pain and pelvic pain.  Musculoskeletal: Positive for back pain and gait problem.       Neck and back pain as well as gait instability.  Skin: Negative for rash and wound.  Neurological: Positive for headaches. Negative for dizziness, syncope, weakness and numbness.  Hematological: Does not bruise/bleed easily.  Psychiatric/Behavioral: Negative for confusion.    Allergies  Banana; Codeine; and Oxycontin  Home Medications   Current Outpatient Rx  Name Route Sig Dispense Refill  . VITAMIN C-ACEROLA 500 MG PO TABS Oral Take 500 mg by mouth daily.      . ASPIRIN 325 MG PO TABS Oral Take 325 mg by mouth daily.      . ATORVASTATIN CALCIUM 80 MG PO TABS Oral Take 80 mg by mouth daily.      . CHOLECALCIFEROL 400 UNITS  PO TABS Oral Take 400 Units by mouth daily.      Marland Kitchen DIAZEPAM 5 MG PO TABS Oral Take 10 mg by mouth at bedtime.      . OMEGA-3 FATTY ACIDS 1000 MG PO CAPS Oral Take 1 g by mouth daily.      Marland Kitchen FOLIC ACID 400 MCG PO TABS Oral Take 400 mcg by mouth daily.      Marland Kitchen HYDROCHLOROTHIAZIDE 25 MG PO TABS Oral Take 25 mg by mouth daily.      Marland Kitchen HYDROCODONE-ACETAMINOPHEN 10-325 MG PO TABS Oral Take 0.5-1 tablets by mouth every 6 (six) hours as needed. PAIN     . METFORMIN HCL ER (MOD) 500 MG PO TB24 Oral Take 500 mg by mouth daily with breakfast.      . MOXIFLOXACIN HCL 0.5 % OP SOLN Left Eye  Place 1 drop into the left eye 3 (three) times daily. DAY OF AND 2 DAYS AFTER MONTHLY EYE INJECTION     . OCUVITE-LUTEIN PO CAPS Oral Take 1 capsule by mouth daily.      Marland Kitchen OVER THE COUNTER MEDICATION Oral Take 2 tablets by mouth daily. "EQUATE" STIMULANT LAXATIVE FOR CONSTIPATION     . OVER THE COUNTER MEDICATION Oral Take 1 tablet by mouth daily. MAGNESIUM 250MG      . TELMISARTAN-HCTZ 80-12.5 MG PO TABS Oral Take 1 tablet by mouth daily.      . VENLAFAXINE HCL 75 MG PO CP24 Oral Take 75 mg by mouth daily.      Marland Kitchen VITAMIN E 400 UNITS PO CAPS Oral Take 400 Units by mouth daily.      Marland Kitchen ZINC GLUCONATE 50 MG PO TABS Oral Take 50 mg by mouth daily.        BP 161/71  Pulse 64  Temp(Src) 97.4 F (36.3 C) (Oral)  Resp 18  SpO2 96%  Physical Exam  Nursing note and vitals reviewed. Constitutional: She is oriented to person, place, and time. She appears well-developed and well-nourished. No distress.       Obese  HENT:  Head: Normocephalic.  Right Ear: External ear normal.  Left Ear: External ear normal.  Nose: Nose normal.  Mouth/Throat: Oropharynx is clear and moist.       Hematoma to occiput with mild tenderness to palpation. There are no abrasions or lacerations seen.  Eyes: Conjunctivae are normal. Pupils are equal, round, and reactive to light.       Blind in right eye  Neck: Normal range of motion. Neck supple.  Cardiovascular: Normal rate, regular rhythm and intact distal pulses.   No murmur heard. Pulmonary/Chest: Effort normal and breath sounds normal. No respiratory distress. She has no wheezes. She exhibits no tenderness.  Abdominal: Soft. Bowel sounds are normal. She exhibits no distension. There is no tenderness.  Musculoskeletal: She exhibits no edema.       There is paracervical tenderness to palpation without bony tenderness, step-off or deformity. There is no bony tenderness, para thoracic tenderness, step-off, deformity to the thoracic spine. There is lumbar spine point  tenderness without paralumbar tenderness, step-off or deformity. There is 4/5 strength in bilateral hip flexion-the patient reports this is approximately normal strength for her. There is 5 out of 5 plantar flexion and dorsi flexion in bilateral feet. Grip strength is 5 out of 5 bilaterally.  Neurological: She is alert and oriented to person, place, and time. She displays normal reflexes.       Patient with no vision in right eye; cranial  nerves II through XII are otherwise intact.  Finger to nose is intact bilaterally. Sensation is intact to light touch. Gait is slow and requires significant assistance but is steady-this is normal per the patient.  Skin: Skin is warm and dry.  Psychiatric: Her behavior is normal.    ED Course  Procedures (including critical care time)  Labs Reviewed - No data to display Dg Lumbar Spine Complete  04/14/2011  *RADIOLOGY REPORT*  Clinical Data: Low back pain after syncope with fall  LUMBAR SPINE - COMPLETE 4+ VIEW  Comparison: Myelogram 03/12/2011, [facility]  Findings: Postoperative changes with posterior plate and screw fixation from L3 to the sacrum and intervertebral disc fusion devices at L3-4, L4-5, and L5-S1.  Fused segments appear stable since the previous study.  Normal alignment of the lumbar vertebrae.  Mild anterior wedging of T12 with associated degenerative changes, likely chronic.  No focal bone lesion or bone destruction appreciated. Diffuse degenerative changes throughout the lumbar spine.  Vascular calcifications.  IMPRESSION: Postoperative and degenerative changes in the lumbar spine.  No displaced fractures identified.  Chronic anterior compression of T12.  Original Report Authenticated By: Marlon Pel, M.D.   Dg Pelvis 1-2 Views  04/14/2011  *RADIOLOGY REPORT*  Clinical Data: Syncope and fall with low back pain and hip pain  PELVIS - 1-2 VIEW  Comparison: 02/16/2011  Findings: Identification marker is positioned over the right  femoral neck, limiting visualization of this area.  Postoperative changes in the lower lumbar spine.  The pelvis, SI joints, symphysis pubis, and hips appear intact without evidence of acute fracture or subluxation.  Degenerative changes in both hips.  No focal bone lesion or bone destruction appreciated.  Vascular calcifications.  IMPRESSION: Degenerative changes in the hips.  No acute fracture or dislocation demonstrated.  Original Report Authenticated By: Marlon Pel, M.D.   Ct Head Wo Contrast  04/14/2011  *RADIOLOGY REPORT*  Clinical Data: Pain after fall.  Occipital and posterior neck pain.  CT HEAD WITHOUT CONTRAST  Technique:  Contiguous axial images were obtained from the base of the skull through the vertex without contrast.  Comparison: 02/16/2011  Findings: Mild cerebral atrophy.  The ventricles and sulci appear symmetrical.  No mass effect or midline shift.  No abnormal extra- axial fluid collections.  No ventricular dilatation.  Gray-white matter junctions are distinct.  Basal cisterns are not effaced.  No evidence of acute intracranial hemorrhage.  No significant changes since the previous study.  No depressed skull fractures. Visualized paranasal sinuses are not opacified.  IMPRESSION: No evidence of acute intracranial hemorrhage, mass lesion, or acute infarct.  Original Report Authenticated By: Marlon Pel, M.D.   Ct Cervical Spine Wo Contrast  04/14/2011  *RADIOLOGY REPORT*  Clinical Data: Posterior neck pain after fall  CT CERVICAL SPINE WITHOUT CONTRAST  Technique:  Multidetector CT imaging of the cervical spine was performed. Multiplanar CT image reconstructions were also generated.  Comparison: CT myelogram 03/12/2011, [facility]  Findings: Postoperative changes with anterior plate and screw fixation from C5-C6 and intervertebral disc fusion plugs at C4-5 and C5-6.  The there appears to be an old screw tract in the C4 vertebra.  Degenerative changes at C6-7 level.   Degenerative changes at C1-2.  Normal alignment of the cervical vertebrae and facet joints.  No vertebral compression deformities.  No prevertebral soft tissue swelling.  Lateral masses of C1 appear symmetrical.  The odontoid process appears intact.  No significant infiltration into the paraspinal soft  tissues.  No focal bone lesion or bone destruction appreciated.  Stable appearance since previous study.  Vascular calcifications in the cervical carotid arteries.  IMPRESSION: Stable alignment and postoperative changes in the cervical spine. No acute displaced fractures identified.  Original Report Authenticated By: Marlon Pel, M.D.     1. Fall   2. Scalp hematoma       MDM  Mechanical fall this evening. X-rays reviewed. No acute findings. Physical exam demonstrates only hematoma to occiput. Patient's gait is baseline for her. No gross motor or neurologic deficits. The patient has pain medication already at home. Given her history of recent falls, I have advised against using this in my she will be in bed for the 6 hours afterward. The patient will stay with her sister for the next few days for close monitoring. She is to follow closely with her doctor for reevaluation of both her injury and for further discussion of her recent falls.        [address] [place], [place] 04/15/11 [phone]

## 2011-04-15 NOTE — ED Provider Notes (Signed)
Medical screening examination/treatment/procedure(s) were conducted as a shared visit with non-physician practitioner(s) and myself.  I personally evaluated the patient during the encounter  Doug Sou, MD 04/15/11 [phone]

## 2011-04-17 ENCOUNTER — Ambulatory Visit: Payer: Medicare Other | Attending: Anesthesiology | Admitting: Physical Therapy

## 2011-04-17 DIAGNOSIS — R279 Unspecified lack of coordination: Secondary | ICD-10-CM | POA: Insufficient documentation

## 2011-04-17 DIAGNOSIS — Z96659 Presence of unspecified artificial knee joint: Secondary | ICD-10-CM | POA: Insufficient documentation

## 2011-04-17 DIAGNOSIS — IMO0001 Reserved for inherently not codable concepts without codable children: Secondary | ICD-10-CM | POA: Insufficient documentation

## 2011-04-18 ENCOUNTER — Ambulatory Visit: Payer: Medicare Other | Admitting: Physical Therapy

## 2011-04-18 ENCOUNTER — Encounter (HOSPITAL_COMMUNITY): Payer: Self-pay

## 2011-04-23 ENCOUNTER — Other Ambulatory Visit: Payer: Self-pay

## 2011-04-23 ENCOUNTER — Encounter (HOSPITAL_COMMUNITY): Payer: Self-pay

## 2011-04-23 ENCOUNTER — Encounter (HOSPITAL_COMMUNITY)
Admission: RE | Admit: 2011-04-23 | Discharge: 2011-04-23 | Disposition: A | Discharge status: Home / Self Care | Payer: Medicare Other | Source: Ambulatory Visit | Attending: Neurosurgery | Admitting: Neurosurgery

## 2011-04-23 ENCOUNTER — Encounter (HOSPITAL_COMMUNITY)
Admission: RE | Admit: 2011-04-23 | Discharge: 2011-04-23 | Disposition: A | Discharge status: Home / Self Care | Payer: Medicare Other | Source: Ambulatory Visit | Attending: Anesthesiology | Admitting: Anesthesiology

## 2011-04-23 HISTORY — DX: Other chronic pain: G89.29

## 2011-04-23 HISTORY — DX: Dorsalgia, unspecified: M54.9

## 2011-04-23 HISTORY — DX: Repeated falls: R29.6

## 2011-04-23 HISTORY — DX: Major depressive disorder, single episode, unspecified: F32.9

## 2011-04-23 HISTORY — DX: Unspecified osteoarthritis, unspecified site: M19.90

## 2011-04-23 HISTORY — DX: Unspecified macular degeneration: H35.30

## 2011-04-23 HISTORY — DX: Depression, unspecified: F32.A

## 2011-04-23 LAB — BASIC METABOLIC PANEL
BUN: 18 mg/dL (ref 6–23)
CO2: 33 mEq/L — ABNORMAL HIGH (ref 19–32)
Calcium: 10.1 mg/dL (ref 8.4–10.5)
Chloride: 100 mEq/L (ref 96–112)
Creatinine, Ser: 0.84 mg/dL (ref 0.50–1.10)
GFR calc Af Amer: 79 mL/min — ABNORMAL LOW (ref >90)
GFR calc non Af Amer: 68 mL/min — ABNORMAL LOW (ref >90)
Glucose, Bld: 96 mg/dL (ref 70–99)
Potassium: 4.5 mEq/L (ref 3.5–5.1)
Sodium: 140 mEq/L (ref 135–145)

## 2011-04-23 LAB — TYPE AND SCREEN
ABO/RH(D): O POS
Antibody Screen: NEGATIVE

## 2011-04-23 LAB — CBC
HCT: 40 % (ref 36.0–46.0)
Hemoglobin: 13.2 g/dL (ref 12.0–15.0)
MCH: 32.6 pg (ref 26.0–34.0)
MCHC: 33 g/dL (ref 30.0–36.0)
MCV: 98.8 fL (ref 78.0–100.0)
Platelets: 223 10*3/uL (ref 150–400)
RBC: 4.05 MIL/uL (ref 3.87–5.11)
RDW: 13.3 % (ref 11.5–15.5)
WBC: 6.2 10*3/uL (ref 4.0–10.5)

## 2011-04-23 LAB — SURGICAL PCR SCREEN
MRSA, PCR: NEGATIVE
Staphylococcus aureus: NEGATIVE

## 2011-04-23 NOTE — Pre-Procedure Instructions (Signed)
20 Leslie Hendricks  04/23/2011   Your procedure is scheduled on:  Thursday, January 17  Report to [facility] at 9:15 AM.  Call this number if you have problems the morning of surgery: [phone]   Remember:   Do not eat food:After Midnight.  May have clear liquids: up to 4 Hours before arrival.  Clear liquids include soda, tea, black coffee, apple or grape juice, broth.  Take these medicines the morning of surgery with A SIP OF WATER: Norco if needed, Effexor  Stop aspirin, fish oil   Do not wear jewelry, make-up or nail polish.  Do not wear lotions, powders, or perfumes. You may wear deodorant.  Do not shave 48 hours prior to surgery.  Do not bring valuables to the hospital.  Contacts, dentures or bridgework may not be worn into surgery.  Leave suitcase in the car. After surgery it may be brought to your room.  For patients admitted to the hospital, checkout time is 11:00 AM the day of discharge.   Patients discharged the day of surgery will not be allowed to drive home.  Name and phone number of your driver: NA  Special Instructions: CHG Shower Use Special Wash: 1/2 bottle night before surgery and 1/2 bottle morning of surgery.   Please read over the following fact sheets that you were given: Pain Booklet, Coughing and Deep Breathing, Blood Transfusion Information and Surgical Site Infection Prevention

## 2011-05-01 MED ORDER — CEFAZOLIN SODIUM-DEXTROSE 2-3 GM-% IV SOLR
2.0000 g | INTRAVENOUS | Status: AC
  Administered 2011-05-02: 2 g via INTRAVENOUS
  Filled 2011-05-01: qty 50

## 2011-05-02 ENCOUNTER — Encounter (HOSPITAL_COMMUNITY): Payer: Self-pay | Admitting: Anesthesiology

## 2011-05-02 ENCOUNTER — Inpatient Hospital Stay (HOSPITAL_COMMUNITY): Payer: Medicare Other

## 2011-05-02 ENCOUNTER — Encounter (HOSPITAL_COMMUNITY)
Admission: RE | Disposition: A | Discharge status: Skilled Nursing Facility | Payer: Self-pay | Source: Ambulatory Visit | Attending: Neurosurgery

## 2011-05-02 ENCOUNTER — Inpatient Hospital Stay (HOSPITAL_COMMUNITY): Payer: Medicare Other | Admitting: Anesthesiology

## 2011-05-02 ENCOUNTER — Encounter (HOSPITAL_COMMUNITY): Payer: Self-pay | Admitting: *Deleted

## 2011-05-02 ENCOUNTER — Inpatient Hospital Stay (HOSPITAL_COMMUNITY)
Admission: RE | Admit: 2011-05-02 | Discharge: 2011-05-07 | DRG: 460 | Disposition: A | Discharge status: Skilled Nursing Facility | Payer: Medicare Other | Source: Ambulatory Visit | Attending: Neurosurgery | Admitting: Neurosurgery

## 2011-05-02 DIAGNOSIS — F3289 Other specified depressive episodes: Secondary | ICD-10-CM | POA: Diagnosis present

## 2011-05-02 DIAGNOSIS — K5900 Constipation, unspecified: Secondary | ICD-10-CM | POA: Diagnosis present

## 2011-05-02 DIAGNOSIS — H3530 Unspecified macular degeneration: Secondary | ICD-10-CM | POA: Diagnosis present

## 2011-05-02 DIAGNOSIS — F329 Major depressive disorder, single episode, unspecified: Secondary | ICD-10-CM | POA: Diagnosis present

## 2011-05-02 DIAGNOSIS — H5440 Blindness, one eye, unspecified eye: Secondary | ICD-10-CM | POA: Diagnosis present

## 2011-05-02 DIAGNOSIS — G8929 Other chronic pain: Secondary | ICD-10-CM | POA: Diagnosis present

## 2011-05-02 DIAGNOSIS — E119 Type 2 diabetes mellitus without complications: Secondary | ICD-10-CM | POA: Diagnosis present

## 2011-05-02 DIAGNOSIS — M79609 Pain in unspecified limb: Secondary | ICD-10-CM | POA: Diagnosis present

## 2011-05-02 DIAGNOSIS — I10 Essential (primary) hypertension: Secondary | ICD-10-CM | POA: Diagnosis present

## 2011-05-02 DIAGNOSIS — M48062 Spinal stenosis, lumbar region with neurogenic claudication: Principal | ICD-10-CM | POA: Diagnosis present

## 2011-05-02 DIAGNOSIS — M51379 Other intervertebral disc degeneration, lumbosacral region without mention of lumbar back pain or lower extremity pain: Secondary | ICD-10-CM | POA: Diagnosis present

## 2011-05-02 DIAGNOSIS — R002 Palpitations: Secondary | ICD-10-CM | POA: Diagnosis present

## 2011-05-02 DIAGNOSIS — E785 Hyperlipidemia, unspecified: Secondary | ICD-10-CM | POA: Diagnosis present

## 2011-05-02 DIAGNOSIS — Z7982 Long term (current) use of aspirin: Secondary | ICD-10-CM

## 2011-05-02 DIAGNOSIS — Z79899 Other long term (current) drug therapy: Secondary | ICD-10-CM

## 2011-05-02 DIAGNOSIS — M5137 Other intervertebral disc degeneration, lumbosacral region: Secondary | ICD-10-CM | POA: Diagnosis present

## 2011-05-02 DIAGNOSIS — Z8542 Personal history of malignant neoplasm of other parts of uterus: Secondary | ICD-10-CM

## 2011-05-02 DIAGNOSIS — Z91018 Allergy to other foods: Secondary | ICD-10-CM

## 2011-05-02 DIAGNOSIS — Z87891 Personal history of nicotine dependence: Secondary | ICD-10-CM

## 2011-05-02 DIAGNOSIS — M810 Age-related osteoporosis without current pathological fracture: Secondary | ICD-10-CM | POA: Diagnosis present

## 2011-05-02 DIAGNOSIS — Z9181 History of falling: Secondary | ICD-10-CM

## 2011-05-02 DIAGNOSIS — Z886 Allergy status to analgesic agent status: Secondary | ICD-10-CM

## 2011-05-02 LAB — GLUCOSE, CAPILLARY
Glucose-Capillary: 100 mg/dL — ABNORMAL HIGH (ref 70–99)
Glucose-Capillary: 136 mg/dL — ABNORMAL HIGH (ref 70–99)
Glucose-Capillary: 166 mg/dL — ABNORMAL HIGH (ref 70–99)
Glucose-Capillary: 175 mg/dL — ABNORMAL HIGH (ref 70–99)

## 2011-05-02 SURGERY — POSTERIOR LUMBAR FUSION 1 LEVEL
Anesthesia: General | Wound class: Clean

## 2011-05-02 MED ORDER — INSULIN ASPART 100 UNIT/ML ~~LOC~~ SOLN
0.0000 [IU] | SUBCUTANEOUS | Status: DC
  Administered 2011-05-02 (×2): 4 [IU] via SUBCUTANEOUS
  Administered 2011-05-03 – 2011-05-05 (×6): 3 [IU] via SUBCUTANEOUS
  Filled 2011-05-02: qty 3

## 2011-05-02 MED ORDER — TELMISARTAN-HCTZ 80-12.5 MG PO TABS
1.0000 | ORAL_TABLET | Freq: Every day | ORAL | Status: DC
Start: 2011-05-02 — End: 2011-05-02

## 2011-05-02 MED ORDER — ONDANSETRON HCL 4 MG/2ML IJ SOLN: 4.0000 mg | INTRAMUSCULAR | Status: DC | PRN

## 2011-05-02 MED ORDER — HYDROCHLOROTHIAZIDE 12.5 MG PO CAPS
12.5000 mg | ORAL_CAPSULE | Freq: Every day | ORAL | Status: DC
  Administered 2011-05-02 – 2011-05-06 (×4): 12.5 mg via ORAL
  Filled 2011-05-02 (×6): qty 1

## 2011-05-02 MED ORDER — GLYCOPYRROLATE 0.2 MG/ML IJ SOLN
INTRAMUSCULAR | Status: DC | PRN
  Administered 2011-05-02: .4 mg via INTRAVENOUS

## 2011-05-02 MED ORDER — OCUVITE-LUTEIN PO CAPS
1.0000 | ORAL_CAPSULE | Freq: Every day | ORAL | Status: DC
  Administered 2011-05-02 – 2011-05-04 (×3): 1 via ORAL
  Filled 2011-05-02 (×4): qty 1

## 2011-05-02 MED ORDER — FENTANYL CITRATE 0.05 MG/ML IJ SOLN
INTRAMUSCULAR | Status: DC | PRN
Start: 2011-05-02 — End: 2011-05-02
  Administered 2011-05-02: 50 ug via INTRAVENOUS
  Administered 2011-05-02: 100 ug via INTRAVENOUS
  Administered 2011-05-02: 150 ug via INTRAVENOUS

## 2011-05-02 MED ORDER — ZOLPIDEM TARTRATE 5 MG PO TABS
5.0000 mg | ORAL_TABLET | Freq: Every evening | ORAL | Status: DC | PRN
  Administered 2011-05-03: 5 mg via ORAL
  Filled 2011-05-02: qty 1

## 2011-05-02 MED ORDER — BACITRACIN ZINC 500 UNIT/GM EX OINT
TOPICAL_OINTMENT | CUTANEOUS | Status: DC | PRN
  Administered 2011-05-02: 1 via TOPICAL

## 2011-05-02 MED ORDER — VECURONIUM BROMIDE 10 MG IV SOLR
INTRAVENOUS | Status: DC | PRN
  Administered 2011-05-02 (×2): 2 mg via INTRAVENOUS

## 2011-05-02 MED ORDER — DIAZEPAM 5 MG PO TABS
5.0000 mg | ORAL_TABLET | Freq: Every day | ORAL | Status: DC
  Administered 2011-05-02 – 2011-05-06 (×5): 5 mg via ORAL
  Filled 2011-05-02 (×6): qty 1

## 2011-05-02 MED ORDER — CEFAZOLIN SODIUM 1-5 GM-% IV SOLN
1.0000 g | Freq: Three times a day (TID) | INTRAVENOUS | Status: AC
  Administered 2011-05-02 (×2): 1 g via INTRAVENOUS
  Filled 2011-05-02 (×2): qty 50

## 2011-05-02 MED ORDER — HEMOSTATIC AGENTS (NO CHARGE) OPTIME
TOPICAL | Status: DC | PRN
  Administered 2011-05-02: 1 via TOPICAL

## 2011-05-02 MED ORDER — GATIFLOXACIN 0.5 % OP SOLN
1.0000 [drp] | Freq: Four times a day (QID) | OPHTHALMIC | Status: DC
  Administered 2011-05-02 – 2011-05-07 (×12): 1 [drp] via OPHTHALMIC
  Filled 2011-05-02 (×2): qty 2.5

## 2011-05-02 MED ORDER — 0.9 % SODIUM CHLORIDE (POUR BTL) OPTIME
TOPICAL | Status: DC | PRN
  Administered 2011-05-02: 1000 mL

## 2011-05-02 MED ORDER — BUPIVACAINE LIPOSOME 1.3 % IJ SUSP
20.0000 mL | INTRAMUSCULAR | Status: AC
  Administered 2011-05-02: 20 mL
  Filled 2011-05-02: qty 20

## 2011-05-02 MED ORDER — OLMESARTAN MEDOXOMIL 40 MG PO TABS
40.0000 mg | ORAL_TABLET | Freq: Every day | ORAL | Status: DC
  Administered 2011-05-02 – 2011-05-06 (×4): 40 mg via ORAL
  Filled 2011-05-02 (×6): qty 1

## 2011-05-02 MED ORDER — MENTHOL 3 MG MT LOZG: 1.0000 | LOZENGE | OROMUCOSAL | Status: DC | PRN

## 2011-05-02 MED ORDER — DEXAMETHASONE SODIUM PHOSPHATE 4 MG/ML IJ SOLN
INTRAMUSCULAR | Status: DC | PRN
  Administered 2011-05-02: 8 mg via INTRAVENOUS

## 2011-05-02 MED ORDER — NEOSTIGMINE METHYLSULFATE 1 MG/ML IJ SOLN
INTRAMUSCULAR | Status: DC | PRN
  Administered 2011-05-02: 3 mg via INTRAVENOUS

## 2011-05-02 MED ORDER — ONDANSETRON HCL 4 MG/2ML IJ SOLN
INTRAMUSCULAR | Status: DC | PRN
  Administered 2011-05-02: 4 mg via INTRAVENOUS

## 2011-05-02 MED ORDER — BUPIVACAINE-EPINEPHRINE PF 0.5-1:200000 % IJ SOLN
INTRAMUSCULAR | Status: DC | PRN
  Administered 2011-05-02: 10 mL

## 2011-05-02 MED ORDER — ONDANSETRON HCL 4 MG/2ML IJ SOLN: 4.0000 mg | Freq: Four times a day (QID) | INTRAMUSCULAR | Status: DC | PRN

## 2011-05-02 MED ORDER — ACETAMINOPHEN 650 MG RE SUPP: 650.0000 mg | RECTAL | Status: DC | PRN

## 2011-05-02 MED ORDER — PHENOL 1.4 % MT LIQD: 1.0000 | OROMUCOSAL | Status: DC | PRN

## 2011-05-02 MED ORDER — HYDROCHLOROTHIAZIDE 25 MG PO TABS
25.0000 mg | ORAL_TABLET | Freq: Every day | ORAL | Status: DC
  Administered 2011-05-02 – 2011-05-06 (×4): 25 mg via ORAL
  Filled 2011-05-02 (×7): qty 1

## 2011-05-02 MED ORDER — ROCURONIUM BROMIDE 100 MG/10ML IV SOLN
INTRAVENOUS | Status: DC | PRN
  Administered 2011-05-02: 50 mg via INTRAVENOUS

## 2011-05-02 MED ORDER — HETASTARCH-ELECTROLYTES 6 % IV SOLN
INTRAVENOUS | Status: DC | PRN
  Administered 2011-05-02: 08:00:00 via INTRAVENOUS

## 2011-05-02 MED ORDER — SODIUM CHLORIDE 0.9 % IV SOLN
INTRAVENOUS | Status: AC
  Filled 2011-05-02: qty 500

## 2011-05-02 MED ORDER — METFORMIN HCL ER 500 MG PO TB24
500.0000 mg | ORAL_TABLET | Freq: Every day | ORAL | Status: DC
  Administered 2011-05-03 – 2011-05-06 (×4): 500 mg via ORAL
  Filled 2011-05-02 (×6): qty 1

## 2011-05-02 MED ORDER — DIAZEPAM 5 MG PO TABS
5.0000 mg | ORAL_TABLET | Freq: Four times a day (QID) | ORAL | Status: DC | PRN
  Administered 2011-05-03: 5 mg via ORAL
  Filled 2011-05-02: qty 1

## 2011-05-02 MED ORDER — VENLAFAXINE HCL ER 75 MG PO CP24
75.0000 mg | ORAL_CAPSULE | Freq: Every day | ORAL | Status: DC
  Administered 2011-05-03 – 2011-05-06 (×4): 75 mg via ORAL
  Filled 2011-05-02 (×5): qty 1

## 2011-05-02 MED ORDER — SODIUM CHLORIDE 0.9 % IR SOLN
Status: DC | PRN
  Administered 2011-05-02: 07:00:00

## 2011-05-02 MED ORDER — LACTATED RINGERS IV SOLN
INTRAVENOUS | Status: DC | PRN
  Administered 2011-05-02 (×2): via INTRAVENOUS

## 2011-05-02 MED ORDER — ROSUVASTATIN CALCIUM 40 MG PO TABS
40.0000 mg | ORAL_TABLET | Freq: Every day | ORAL | Status: DC
  Administered 2011-05-02 – 2011-05-06 (×5): 40 mg via ORAL
  Filled 2011-05-02 (×6): qty 1

## 2011-05-02 MED ORDER — DOCUSATE SODIUM 100 MG PO CAPS
100.0000 mg | ORAL_CAPSULE | Freq: Two times a day (BID) | ORAL | Status: DC
  Administered 2011-05-02 – 2011-05-07 (×10): 100 mg via ORAL
  Filled 2011-05-02 (×8): qty 1

## 2011-05-02 MED ORDER — ACETAMINOPHEN 325 MG PO TABS
650.0000 mg | ORAL_TABLET | ORAL | Status: DC | PRN
  Administered 2011-05-05: 650 mg via ORAL
  Filled 2011-05-02: qty 2

## 2011-05-02 MED ORDER — HYDROMORPHONE HCL 2 MG PO TABS
2.0000 mg | ORAL_TABLET | ORAL | Status: DC | PRN
  Administered 2011-05-02 – 2011-05-05 (×10): 2 mg via ORAL
  Filled 2011-05-02 (×9): qty 1
  Filled 2011-05-02: qty 2

## 2011-05-02 MED ORDER — PROPOFOL 10 MG/ML IV EMUL
INTRAVENOUS | Status: DC | PRN
  Administered 2011-05-02: 150 mg via INTRAVENOUS

## 2011-05-02 MED ORDER — HYDROMORPHONE HCL PF 1 MG/ML IJ SOLN
INTRAMUSCULAR | Status: AC
  Filled 2011-05-02: qty 1

## 2011-05-02 MED ORDER — THROMBIN 20000 UNITS EX KIT
PACK | CUTANEOUS | Status: DC | PRN
  Administered 2011-05-02: 20000 [IU] via TOPICAL

## 2011-05-02 MED ORDER — BACITRACIN 50000 UNITS IM SOLR
INTRAMUSCULAR | Status: AC
  Filled 2011-05-02: qty 1

## 2011-05-02 MED ORDER — HYDROMORPHONE HCL PF 1 MG/ML IJ SOLN
0.2500 mg | INTRAMUSCULAR | Status: DC | PRN
  Administered 2011-05-02 (×2): 0.5 mg via INTRAVENOUS

## 2011-05-02 MED ORDER — LACTATED RINGERS IV SOLN: INTRAVENOUS | Status: DC

## 2011-05-02 SURGICAL SUPPLY — 63 items
BAG DECANTER FOR FLEXI CONT (MISCELLANEOUS) ×2 IMPLANT
BENZOIN TINCTURE PRP APPL 2/3 (GAUZE/BANDAGES/DRESSINGS) ×2 IMPLANT
BLADE SURG ROTATE 9660 (MISCELLANEOUS) IMPLANT
BRUSH SCRUB EZ PLAIN DRY (MISCELLANEOUS) ×2 IMPLANT
BUR ACORN 6.0 (BURR) ×2 IMPLANT
BUR MATCHSTICK NEURO 3.0 LAGG (BURR) ×2 IMPLANT
CANISTER SUCTION 2500CC (MISCELLANEOUS) ×2 IMPLANT
CAP REVERE LOCKING (Cap) ×4 IMPLANT
CLOSURE STERI STRIP 1/2 X4 (GAUZE/BANDAGES/DRESSINGS) ×2 IMPLANT
CLOTH BEACON ORANGE TIMEOUT ST (SAFETY) ×2 IMPLANT
CONT SPEC 4OZ CLIKSEAL STRL BL (MISCELLANEOUS) ×2 IMPLANT
COVER BACK TABLE 24X17X13 BIG (DRAPES) IMPLANT
DRAPE C-ARM 42X72 X-RAY (DRAPES) ×4 IMPLANT
DRAPE LAPAROTOMY 100X72X124 (DRAPES) ×2 IMPLANT
DRAPE POUCH INSTRU U-SHP 10X18 (DRAPES) ×2 IMPLANT
DRAPE SURG 17X23 STRL (DRAPES) ×8 IMPLANT
ELECT BLADE 4.0 EZ CLEAN MEGAD (MISCELLANEOUS) ×2
ELECT REM PT RETURN 9FT ADLT (ELECTROSURGICAL) ×2
ELECTRODE BLDE 4.0 EZ CLN MEGD (MISCELLANEOUS) ×1 IMPLANT
ELECTRODE REM PT RTRN 9FT ADLT (ELECTROSURGICAL) ×1 IMPLANT
GAUZE SPONGE 4X4 12PLY STRL LF (GAUZE/BANDAGES/DRESSINGS) ×2 IMPLANT
GAUZE SPONGE 4X4 16PLY XRAY LF (GAUZE/BANDAGES/DRESSINGS) ×2 IMPLANT
GLOVE BIO SURGEON STRL SZ8.5 (GLOVE) ×4 IMPLANT
GLOVE ECLIPSE 7.5 STRL STRAW (GLOVE) ×4 IMPLANT
GLOVE EXAM NITRILE LRG STRL (GLOVE) IMPLANT
GLOVE EXAM NITRILE MD LF STRL (GLOVE) IMPLANT
GLOVE EXAM NITRILE XL STR (GLOVE) IMPLANT
GLOVE EXAM NITRILE XS STR PU (GLOVE) IMPLANT
GLOVE INDICATOR 8.0 STRL GRN (GLOVE) ×2 IMPLANT
GLOVE INDICATOR 8.5 STRL (GLOVE) ×2 IMPLANT
GLOVE SS BIOGEL STRL SZ 8 (GLOVE) ×2 IMPLANT
GLOVE SUPERSENSE BIOGEL SZ 8 (GLOVE) ×2
GOWN BRE IMP SLV AUR LG STRL (GOWN DISPOSABLE) ×2 IMPLANT
GOWN BRE IMP SLV AUR XL STRL (GOWN DISPOSABLE) ×4 IMPLANT
GOWN STRL REIN 2XL LVL4 (GOWN DISPOSABLE) ×2 IMPLANT
KIT BASIN OR (CUSTOM PROCEDURE TRAY) ×2 IMPLANT
KIT ROOM TURNOVER OR (KITS) ×2 IMPLANT
MARKER SKIN DUAL TIP RULER LAB (MISCELLANEOUS) ×2 IMPLANT
NEEDLE HYPO 21X1.5 SAFETY (NEEDLE) ×2 IMPLANT
NEEDLE HYPO 22GX1.5 SAFETY (NEEDLE) ×2 IMPLANT
NS IRRIG 1000ML POUR BTL (IV SOLUTION) ×2 IMPLANT
PACK LAMINECTOMY NEURO (CUSTOM PROCEDURE TRAY) ×2 IMPLANT
PAD ARMBOARD 7.5X6 YLW CONV (MISCELLANEOUS) ×6 IMPLANT
PATTIES SURGICAL .5 X1 (DISPOSABLE) IMPLANT
PUTTY 5ML ACTIFUSE ABX (Putty) ×2 IMPLANT
ROD IN LINE (Rod) IMPLANT
SCREW REVERE 6.35 6.5X55MM (Screw) ×4 IMPLANT
SPONGE GAUZE 4X4 12PLY (GAUZE/BANDAGES/DRESSINGS) ×2 IMPLANT
SPONGE LAP 4X18 X RAY DECT (DISPOSABLE) IMPLANT
SPONGE NEURO XRAY DETECT 1X3 (DISPOSABLE) IMPLANT
SPONGE SURGIFOAM ABS GEL 100 (HEMOSTASIS) ×2 IMPLANT
STRIP CLOSURE SKIN 1/2X4 (GAUZE/BANDAGES/DRESSINGS) ×2 IMPLANT
SUT VIC AB 1 CT1 18XBRD ANBCTR (SUTURE) ×2 IMPLANT
SUT VIC AB 1 CT1 8-18 (SUTURE) ×2
SUT VIC AB 2-0 CP2 18 (SUTURE) ×4 IMPLANT
SYR 20CC LL (SYRINGE) ×2 IMPLANT
SYR 20ML ECCENTRIC (SYRINGE) ×2 IMPLANT
TOWEL OR 17X24 6PK STRL BLUE (TOWEL DISPOSABLE) ×2 IMPLANT
TOWEL OR 17X26 10 PK STRL BLUE (TOWEL DISPOSABLE) ×2 IMPLANT
TRAY FOLEY CATH 14FRSI W/METER (CATHETERS) ×2 IMPLANT
WATER STERILE IRR 1000ML POUR (IV SOLUTION) ×2 IMPLANT
revere addition ×2 IMPLANT
revere addition (Connector) ×2 IMPLANT

## 2011-05-02 NOTE — Progress Notes (Signed)
Subjective:  The patient is alert and pleasant. She looks well.  Objective: Vital signs in last 24 hours: Temp:  [97.5 F (36.4 C)-98.4 F (36.9 C)] 97.5 F (36.4 C) (01/17 1329) Pulse Rate:  [63-74] 74  (01/17 1329) Resp:  [11-38] 18  (01/17 1329) BP: (99-123)/(52-65) 123/64 mmHg (01/17 1329) SpO2:  [88 %-100 %] 90 % (01/17 1329) FiO2 (%):  [2 %] 2 % (01/17 1329) Weight:  [102.099 kg (225 lb 1.4 oz)] 102.099 kg (225 lb 1.4 oz) (01/17 1329)  Intake/Output from previous day:   Intake/Output this shift: Total I/O In: 2200 [I.V.:1700; IV Piggyback:500] Out: 525 [Urine:325; Blood:200]  Physical exam the patient is alert and moving all 4 extremities well.  Lab Results: No results found for this basename: WBC:2,HGB:2,HCT:2,PLT:2 in the last 72 hours BMET No results found for this basename: NA:2,K:2,CL:2,CO2:2,GLUCOSE:2,BUN:2,CREATININE:2,CALCIUM:2 in the last 72 hours  Studies/Results: Dg Lumbar Spine 2-3 Views  05/02/2011  *RADIOLOGY REPORT*  Clinical Data: Back pain  LUMBAR SPINE - 2-3 VIEW  Comparison: CT myelogram 03/12/2011.  Findings: Intraoperative C-arm films document extension of the previous L3-S1 fusion upward to the L2-L3 interspace, by placing pedicle screws bilaterally at L2.  No gross adverse features.  IMPRESSION:  As above.  Original Report Authenticated By: Elsie Stain, M.D.    Assessment/Plan: Patient is doing well.  LOS: 0 days     Maydell Knoebel D 05/02/2011, 2:55 PM

## 2011-05-02 NOTE — Transfer of Care (Signed)
Immediate Anesthesia Transfer of Care Note  Patient: Leslie Hendricks  Procedure(s) Performed:  POSTERIOR LUMBAR FUSION 1 LEVEL - exploration of fusion with Lumbar two three laminectomy posterior lumbar interbody fusion with interbody prothesis posterolateral arthrodesis and posterior segemental instrumentation   Patient Location: PACU  Anesthesia Type: General  Level of Consciousness: awake, alert  and oriented  Airway & Oxygen Therapy: Patient Spontanous Breathing and Patient connected to nasal cannula oxygen  Post-op Assessment: Report given to PACU RN, Post -op Vital signs reviewed and stable and Patient moving all extremities X 4  Post vital signs: Reviewed and stable Filed Vitals:   05/02/11 0613  BP: 99/65  Pulse: 69  Temp: 36.9 C  Resp: 16    Complications: No apparent anesthesia complications

## 2011-05-02 NOTE — Anesthesia Preprocedure Evaluation (Addendum)
Anesthesia Evaluation  Patient identified by MRN, date of birth, ID band Patient awake    Reviewed: Allergy & Precautions, H&P , NPO status , Patient's Chart, lab work & pertinent test results  Airway Mallampati: II  Neck ROM: full    Dental   Pulmonary          Cardiovascular hypertension,     Neuro/Psych PSYCHIATRIC DISORDERS Depression    GI/Hepatic   Endo/Other  Diabetes mellitus-Morbid obesity  Renal/GU      Musculoskeletal  (+) Arthritis -,   Abdominal   Peds  Hematology   Anesthesia Other Findings   Reproductive/Obstetrics                          Anesthesia Physical Anesthesia Plan  ASA: III  Anesthesia Plan: General   Post-op Pain Management:    Induction: Intravenous  Airway Management Planned: Oral ETT  Additional Equipment:   Intra-op Plan:   Post-operative Plan: Extubation in OR  Informed Consent: I have reviewed the patients History and Physical, chart, labs and discussed the procedure including the risks, benefits and alternatives for the proposed anesthesia with the patient or authorized representative who has indicated his/her understanding and acceptance.     Plan Discussed with: CRNA and Surgeon  Anesthesia Plan Comments:        Anesthesia Quick Evaluation

## 2011-05-02 NOTE — Preoperative (Signed)
Beta Blockers   Reason not to administer Beta Blockers:Not Applicable 

## 2011-05-02 NOTE — Anesthesia Postprocedure Evaluation (Signed)
Anesthesia Post Note  Patient: Leslie Hendricks  Procedure(s) Performed:  POSTERIOR LUMBAR FUSION 1 LEVEL - exploration of fusion with Lumbar two three laminectomy posterior lumbar interbody fusion with interbody prothesis posterolateral arthrodesis and posterior segemental instrumentation   Anesthesia type: General  Patient location: PACU  Post pain: Pain level controlled and Adequate analgesia  Post assessment: Post-op Vital signs reviewed, Patient's Cardiovascular Status Stable, Respiratory Function Stable, Patent Airway and Pain level controlled  Last Vitals:  Filed Vitals:   05/02/11 0613  BP: 99/65  Pulse: 69  Temp: 36.9 C  Resp: 16    Post vital signs: Reviewed and stable  Level of consciousness: awake, alert  and oriented  Complications: No apparent anesthesia complications

## 2011-05-02 NOTE — Op Note (Signed)
Brief history: Patient is a 71 year old white female who has had a chronic history of back and leg pain. I have previously performed lumbar fusions on her. The patient initially has done well but has developed recurrent back buttocks and leg pain consistent with neurogenic claudication. She has failed medical management worked up with a lumbar myelo CT which demonstrated patient has significant adjacent degenerative changes and stenosis at L2-3. I discussed the various treatment options with the patient including surgery. The patient has weighed the risks, benefits, and alternatives surgery decided proceed with an exploration of lumbar fusion with L2-3 decompression instrumentation and fusion.  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  Surgeon: Dr. Delma Officer  Asst.: Dr. Aliene Beams  Anesthesia: Gen. endotracheal  Estimated blood loss: 150 cc  Drains: None  Locations: None  Description of procedure: The patient was brought to the operating room by the anesthesia team. General endotracheal anesthesia was induced. The patient was turned to the prone position on the Wilson frame. The patient's lumbosacral region was then prepared with Betadine scrub and Betadine solution. Sterile drapes were applied.  I then injected the area to be incised with Marcaine with epinephrine solution. I then used the scalpel to make a linear midline incision over the L2-3 interspace. I then used electrocautery to perform a bilateral subperiosteal  dissection exposing the spinous process and lamina of L2 and L3 as well as the old hardware at L3 and L2.We then inserted the Verstrac retractor to provide exposure.  I began the decompression by using the high speed drill to perform laminotomies at L2. We then used the Kerrison punches to widen the laminotomy and removed the ligamentum flavum at L2-3 as well as the remainder of the L3 lamina. We used the Kerrison punches to remove the medial facets at L2-3. We performed wide foraminotomies about the bilateral L2 and L3 nerve roots completing the decompression.  We now turned our attention to the posterior lumbar interbody fusion. We inspected the intervertebral disc at L2-3. It was quite spondylotic. I attempted to incise the intervertebral disc at L2-3 with a 15 blade scalpel. I couldn't because it was quite spondylotic. We did not enter into the intervertebral disc space as this would have been quite difficult. Therefore decided not to do the posterior lumbar interbody fusion at L2-3.  We now turned attention to the instrumentation. Under fluoroscopic guidance we cannulated the bilateral L2 pedicles with the bone probe. We then removed the bone probe. He then tapped the pedicle with a 5.5 millimeter tap. We then removed the tap. We probed inside the tapped pedicle with a ball probe to rule out cortical breaches. We then inserted a 6.5 x 55 millimeter pedicle screw into the L2 pedicles bilaterally under fluoroscopic guidance. We then palpated along the medial aspect of the pedicles to rule out cortical breaches. There were none. The nerve roots were not injured.   We now turned attention to the aspiration of the fusion. Inspected the hardware from L3 down to approximately L5. There appeared to be a good fusion. I then removed the cross connector between the 2 rods to allow [place] room to place  the connecting rods. Then placed a add on rod connecting the L2 pedicle screws with the old rod. We then tightened the  caps appropriately. This completed the instrumentation.   We now turned our attention to the posterior lateral arthrodesis at L2-3. We used the high-speed drill to decorticate the remainder of the facets, pars, transverse process at L2-3. We then applied a combination of local morselized autograft bone and Actifuse bone graft extender over these decorticated posterior lateral structures. This completed the posterior lateral arthrodesis.  We then obtained hemostasis using bipolar electrocautery. We irrigated the wound out with bacitracin solution. We inspected the thecal sac and nerve roots and noted they were well decompressed. We then removed the retractor. We reapproximated patient's thoracolumbar fascia with interrupted #1 Vicryl suture. We reapproximated patient's subcutaneous tissue with interrupted 2-0 Vicryl suture. The reapproximated patient's skin with Steri-Strips and benzoin. The wound was then coated with bacitracin ointment. A sterile dressing was applied. The drapes were removed. The patient was subsequently returned to the supine position where they were extubated by the anesthesia team. He was then transported to the post anesthesia care unit in stable condition. All sponge instrument and needle counts were correct at the end of this case.

## 2011-05-02 NOTE — H&P (Signed)
Subjective: The patient is a 71 year old white female who I performed prior back surgeries on. She has developed recurrent back hip and leg pain consistent with neurogenic claudication. She has failed medical management and was worked up with a lumbar myelo CT. This demonstrated patient has stenosis above her surgery i.e. at L2-3. I discussed the various treatment options with her including surgery. Patient has weighed the risks, benefits, and alternatives surgery decided proceed with exploration of her fusion as well as an L2-3 decompression instrumentation and fusion.   Past Medical History  Diagnosis Date  . Diabetes mellitus   . Hypertension   . Cancer   . Uterine cancer dx'd 1991    surg only  . Arthritis   . Depression   . Back pain, chronic   . 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 Falls frequently     neurological workup inconclusive per pt    Past Surgical History  Procedure Date  . Foot surgery     left toe  . Joint replacement   . Neck surgery 2009, 2010    x 2  . Nose surgery   . Back surgery 2002, 2003, 2006    x 3  . Abdominal hysterectomy 1991  . Hemorrhoid surgery   . Eye surgery     R eye     Allergies  Allergen Reactions  . Banana Nausea And Vomiting  . Codeine Nausea And Vomiting  . Oxycontin Nausea And Vomiting    History  Substance Use Topics  . Smoking status: Former Games developer  . Smokeless tobacco: Not on file  . Alcohol Use: No    History reviewed. No pertinent family history. Prior to Admission medications   Medication Sig Start Date End Date Taking? Authorizing Provider  Ascorbic Acid (VITAMIN C WITH ROSE HIPS) 500 MG tablet Take 500 mg by mouth daily.     Yes Historical Provider, MD  aspirin 325 MG tablet Take 325 mg by mouth daily.     Yes Historical Provider, MD  atorvastatin (LIPITOR) 80 MG tablet Take 80 mg by mouth daily.     Yes Historical Provider, MD  cholecalciferol (VITAMIN D) 400 UNITS TABS Take 400 Units by  mouth daily.     Yes Historical Provider, MD  diazepam (VALIUM) 5 MG tablet Take 5 mg by mouth at bedtime.    Yes Historical Provider, MD  fish oil-omega-3 fatty acids 1000 MG capsule Take 1 g by mouth daily.     Yes Historical Provider, MD  folic acid (FOLVITE) 400 MCG tablet Take 400 mcg by mouth daily.     Yes Historical Provider, MD  hydrochlorothiazide (HYDRODIURIL) 25 MG tablet Take 25 mg by mouth daily.    Yes Historical Provider, MD  HYDROcodone-acetaminophen (NORCO) 10-325 MG per tablet Take 1-2 tablets by mouth at bedtime as needed. PAIN   Yes Historical Provider, MD  metFORMIN (GLUMETZA) 500 MG (MOD) 24 hr tablet Take 500 mg by mouth daily with breakfast.     Yes Historical Provider, MD  moxifloxacin (VIGAMOX) 0.5 % ophthalmic solution Place 1 drop into the left eye 3 (three) times daily. DAY OF AND 2 DAYS AFTER MONTHLY EYE INJECTION    Yes Historical Provider, MD  multivitamin-lutein (OCUVITE-LUTEIN) CAPS Take 1 capsule by mouth daily.     Yes Historical Provider, MD  OVER THE COUNTER MEDICATION Take 2 tablets by mouth daily. "EQUATE" STIMULANT LAXATIVE FOR CONSTIPATION   Yes Historical Provider, MD  OVER THE COUNTER MEDICATION Take 1 tablet by mouth daily. MAGNESIUM 250MG    Yes Historical Provider, MD  telmisartan-hydrochlorothiazide (MICARDIS HCT) 80-12.5 MG per tablet Take 1 tablet by mouth daily.     Yes Historical Provider, MD  venlafaxine (EFFEXOR-XR) 75 MG 24 hr capsule Take 75 mg by mouth daily.    Yes Historical Provider, MD  vitamin E 400 UNIT capsule Take 400 Units by mouth daily.     Yes Historical Provider, MD  zinc gluconate 50 MG tablet Take 50 mg by mouth daily.     Yes Historical Provider, MD     Review of Systems  Positive ROS: The patient also has some neck pain.  All other systems have been reviewed and were otherwise negative with the exception of those mentioned in the HPI and as above.  Objective: Vital signs in last 24 hours: Temp:  [98.4 F (36.9 C)]  98.4 F (36.9 C) (01/17 9147) Pulse Rate:  [69] 69  (01/17 0613) Resp:  [16] 16  (01/17 0613) BP: (99)/(65) 99/65 mmHg (01/17 0613) SpO2:  [94 %] 94 % (01/17 8295)  General Appearance: Alert, cooperative, no distress, appears stated age Head: Normocephalic, without obvious abnormality, atraumatic Eyes: PERRL, conjunctiva/corneas clear, EOM's intact, fundi benign, both eyes      Ears: Normal TM's and external ear canals, both ears Throat: Lips, mucosa, and tongue normal; teeth and gums normal Neck: Supple, symmetrical, trachea midline, no adenopathy; thyroid: No enlargement/tenderness/nodules; no carotid bruit or JVD Back: Symmetric, no curvature, ROM normal, no CVA tenderness the patient's lumbar incision is well-healed. Lungs: Clear to auscultation bilaterally, respirations unlabored Heart: Regular rate and rhythm, S1 and S2 normal, no murmur, rub or gallop Abdomen: Soft, non-tender, bowel sounds active all four quadrants, no masses, no organomegaly Extremities: Extremities normal, atraumatic, no cyanosis or edema Pulses: 2+ and symmetric all extremities Skin: Skin color, texture, turgor normal, no rashes or lesions  NEUROLOGIC:   Mental status: alert and oriented, no aphasia, good attention span, Fund of knowledge/ memory ok Motor Exam - grossly normal Sensory Exam - grossly normal Reflexes:  Coordination - grossly normal Gait - grossly normal Balance - grossly normal Cranial Nerves: I: smell Not tested  II: visual acuity  OS: Normal    OD: Normal   II: visual fields Full to confrontation  II: pupils Equal, round, reactive to light  III,VII: ptosis None  III,IV,VI: extraocular muscles  Full ROM  V: mastication Normal  V: facial light touch sensation  Normal  V,VII: corneal reflex  Present  VII: facial muscle function - upper  Normal  VII: facial muscle function - lower Normal  VIII: hearing Not tested  IX: soft palate elevation  Normal  IX,X: gag reflex Present  XI:  trapezius strength  5/5  XI: sternocleidomastoid strength 5/5  XI: neck flexion strength  5/5  XII: tongue strength  Normal    Data Review Lab Results  Component Value Date   WBC 6.2 04/23/2011   HGB 13.2 04/23/2011   HCT 40.0 04/23/2011   MCV 98.8 04/23/2011   PLT 223 04/23/2011   Lab Results  Component Value Date   NA 140 04/23/2011   K 4.5 04/23/2011   CL 100 04/23/2011   CO2 33* 04/23/2011   BUN 18 04/23/2011   CREATININE 0.84 04/23/2011   GLUCOSE 96 04/23/2011   Lab Results  Component Value Date   INR 0.9 06/13/2008    Assessment/Plan: 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 I reviewed her myelo CT with her and pointed out the abnormalities. We have discussed the various treatment options including surgery. I described the surgical option of an exploration of lumbar fusion with at L2-3 decompression instrumentation and fusion. I've shown her surgical models. We have discussed the risks, benefits, alternatives and likelihood of achieving our goals with surgery. I have answered all her questions. She wants to proceed with surgery.   Jorma Tassinari D 05/02/2011 7:19 AM

## 2011-05-02 NOTE — Progress Notes (Signed)
Subjective:  The patient is somnolent but easily arousable. She looks well.  Objective: Vital signs in last 24 hours: Temp:  [97.9 F (36.6 C)-98.4 F (36.9 C)] 97.9 F (36.6 C) (01/17 1105) Pulse Rate:  [69] 69  (01/17 0613) Resp:  [16-21] 21  (01/17 1105) BP: (99-118)/(52-65) 118/52 mmHg (01/17 1105) SpO2:  [94 %-96 %] 96 % (01/17 1105)  Intake/Output from previous day:   Intake/Output this shift: Total I/O In: 2200 [I.V.:1700; IV Piggyback:500] Out: 525 [Urine:325; Blood:200]  Physical exam the patient is somnolent but easily arousable. Glasgow Coma Scale 13. She is moving all 4 extremities well.  Lab Results: No results found for this basename: WBC:2,HGB:2,HCT:2,PLT:2 in the last 72 hours BMET No results found for this basename: NA:2,K:2,CL:2,CO2:2,GLUCOSE:2,BUN:2,CREATININE:2,CALCIUM:2 in the last 72 hours  Studies/Results: No results found.  Assessment/Plan: The patient is doing well.  LOS: 0 days     Rya Rausch D 05/02/2011, 11:34 AM

## 2011-05-03 DIAGNOSIS — M48061 Spinal stenosis, 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 neurogenic claudication: Secondary | ICD-10-CM

## 2011-05-03 DIAGNOSIS — IMO0002 Reserved for concepts with insufficient information to code with codable children: Secondary | ICD-10-CM

## 2011-05-03 LAB — BASIC METABOLIC PANEL
BUN: 11 mg/dL (ref 6–23)
CO2: 31 mEq/L (ref 19–32)
Calcium: 8.9 mg/dL (ref 8.4–10.5)
Chloride: 101 mEq/L (ref 96–112)
Creatinine, Ser: 0.78 mg/dL (ref 0.50–1.10)
GFR calc Af Amer: >90 mL/min (ref >90)
GFR calc non Af Amer: 82 mL/min — ABNORMAL LOW (ref >90)
Glucose, Bld: 133 mg/dL — ABNORMAL HIGH (ref 70–99)
Potassium: 3.9 mEq/L (ref 3.5–5.1)
Sodium: 137 mEq/L (ref 135–145)

## 2011-05-03 LAB — CBC
HCT: 31.2 % — ABNORMAL LOW (ref 36.0–46.0)
Hemoglobin: 10.2 g/dL — ABNORMAL LOW (ref 12.0–15.0)
MCH: 32.5 pg (ref 26.0–34.0)
MCHC: 32.7 g/dL (ref 30.0–36.0)
MCV: 99.4 fL (ref 78.0–100.0)
Platelets: 197 10*3/uL (ref 150–400)
RBC: 3.14 MIL/uL — ABNORMAL LOW (ref 3.87–5.11)
RDW: 13.5 % (ref 11.5–15.5)
WBC: 8.8 10*3/uL (ref 4.0–10.5)

## 2011-05-03 LAB — GLUCOSE, CAPILLARY
Glucose-Capillary: 110 mg/dL — ABNORMAL HIGH (ref 70–99)
Glucose-Capillary: 113 mg/dL — ABNORMAL HIGH (ref 70–99)
Glucose-Capillary: 128 mg/dL — ABNORMAL HIGH (ref 70–99)
Glucose-Capillary: 129 mg/dL — ABNORMAL HIGH (ref 70–99)
Glucose-Capillary: 138 mg/dL — ABNORMAL HIGH (ref 70–99)
Glucose-Capillary: 139 mg/dL — ABNORMAL HIGH (ref 70–99)
Glucose-Capillary: 99 mg/dL (ref 70–99)

## 2011-05-03 NOTE — Progress Notes (Signed)
UR COMPLETED  

## 2011-05-03 NOTE — Progress Notes (Signed)
Patient ID: Leslie Hendricks, female   DOB: 1940-12-20, 70 y.o.   MRN: [id number] Subjective:  The patient is alert and pleasant. Her back is appropriately sore. She looks well. She is interested in going into rehabilitation prior to discharge.  Objective: Vital signs in last 24 hours: Temp:  [97.5 F (36.4 C)-98.8 F (37.1 C)] 98.1 F (36.7 C) (01/18 0500) Pulse Rate:  [63-74] 70  (01/18 0500) Resp:  [11-38] 18  (01/18 0500) BP: (96-129)/(51-66) 96/51 mmHg (01/18 0500) SpO2:  [88 %-100 %] 93 % (01/18 0500) FiO2 (%):  [2 %] 2 % (01/17 1329) Weight:  [102.099 kg (225 lb 1.4 oz)] 102.099 kg (225 lb 1.4 oz) (01/17 1329)  Intake/Output from previous day: 01/17 0701 - 01/18 0700 In: 3100 [I.V.:2600; IV Piggyback:500] Out: 2925 [Urine:2725; Blood:200] Intake/Output this shift:    Physical exam the patient is alert and oriented. Her lower extremity motor strength is grossly normal.  Lab Results:  [facility] 05/03/11 0435  WBC 8.8  HGB 10.2*  HCT 31.2*  PLT 197   BMET  Basename 05/03/11 0435  NA 137  K 3.9  CL 101  CO2 31  GLUCOSE 133*  BUN 11  CREATININE 0.78  CALCIUM 8.9    Studies/Results: Dg Lumbar Spine 2-3 Views  05/02/2011  *RADIOLOGY REPORT*  Clinical Data: Back pain  LUMBAR SPINE - 2-3 VIEW  Comparison: CT myelogram 03/12/2011.  Findings: Intraoperative C-arm films document extension of the previous L3-S1 fusion upward to the L2-L3 interspace, by placing pedicle screws bilaterally at L2.  No gross adverse features.  IMPRESSION:  As above.  Original Report Authenticated By: Elsie Stain, M.D.    Assessment/Plan: Postop day 1: The patient is doing well. We will discontinue her Foley and mobilize her with PT and OT. I will asked PMNR to see the patient. We will plan to discontinue her PCA tomorrow.  LOS: 1 day     Letizia Hook D 05/03/2011, 8:02 AM

## 2011-05-03 NOTE — Progress Notes (Signed)
Occupational Therapy Evaluation Patient Details Name: Leslie Hendricks MRN: [id number] DOB: [birth year] Today's Date: 05/03/2011  Problem List:  Patient Active Problem List  Diagnoses  . DIABETES MELLITUS, TYPE II  . HYPERLIPIDEMIA  . MACULAR DEGENERATION  . HYPERTENSION  . LEG PAIN, BILATERAL  . OSTEOPOROSIS  . PALPITATIONS    Past Medical History:  Past Medical History  Diagnosis Date  . Diabetes mellitus   . Hypertension   . Cancer   . Uterine cancer dx'd 1991    surg only  . Arthritis   . Depression   . Back pain, chronic   . 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 Falls frequently     neurological workup inconclusive per pt   Past Surgical History:  Past Surgical History  Procedure Date  . Foot surgery     left toe  . Joint replacement   . Neck surgery 2009, 2010    x 2  . Nose surgery   . Back surgery 2002, 2003, 2006    x 3  . Abdominal hysterectomy 1991  . Hemorrhoid surgery   . Eye surgery     R eye     OT Assessment/Plan/Recommendation OT Assessment Clinical Impression Statement: Pt s/p L2-3 decompression instrumentation and fusion.  Pt with history of falls and most recent admission to hospital is due to fall.  Pt very pleasant and motivated to participate in therapy.  Pt able to perform functional transfer with mod A and increased time due to fear of falling.  Pt with no loss of balance during evaluation. States that she can never tell when she is about to fall and does not know why she does.  If pt is able to reach mod I level, would be excellent candidiate for CIR.  If unable to make progress toward mod I level, then will need to consider SNF due to decreased caregiver support. Will follow acutely to increase I and safety with ADLs and functional transfers at mod I level in prep for d/c to CIR. OT Recommendation/Assessment: Patient will need skilled OT in the acute care venue OT Problem List: Decreased activity tolerance;Impaired  balance (sitting and/or standing);Decreased knowledge of use of DME or AE;Decreased knowledge of precautions;Pain Barriers to Discharge: Decreased caregiver support OT Therapy Diagnosis : Acute pain OT Plan OT Frequency: Min 2X/week OT Treatment/Interventions: Self-care/ADL training;DME and/or AE instruction;Therapeutic activities;Patient/family education;Balance training OT Recommendation Recommendations for Other Services: Rehab consult Follow Up Recommendations: Inpatient Rehab Equipment Recommended: Defer to next venue Individuals Consulted Consulted and Agree with Results and Recommendations: Patient OT Goals Acute Rehab OT Goals OT Goal Formulation: With patient Time For Goal Achievement: 2 weeks ADL Goals Pt Will Perform Grooming: with modified independence;Standing at sink;Other (comment) (while maintaining back safety precautions) ADL Goal: Grooming - Progress: Not met Pt Will Perform Lower Body Bathing: with modified independence;Sit to stand from chair;Sit to stand from bed;with adaptive equipment ADL Goal: Lower Body Bathing - Progress: Not met Pt Will Perform Lower Body Dressing: with modified independence;Sit to stand from chair;Sit to stand from bed;with adaptive equipment ADL Goal: Lower Body Dressing - Progress: Not met Pt Will Transfer to Toilet: with modified independence;Ambulation;with DME;3-in-1;Maintaining back safety precautions ADL Goal: Toilet Transfer - Progress: Not met Pt Will Perform Toileting - Clothing Manipulation: with modified independence;Standing;Sitting on 3-in-1 or toilet;Other (comment) (while maintaining back safety precautions) ADL Goal: Toileting - Clothing Manipulation - Progress: Not met Pt Will Perform Toileting - Hygiene:  with modified independence;Sit to stand from 3-in-1/toilet;Standing at 3-in-1/toilet;Other (comment) (while maintaining back safety precautions) ADL Goal: Toileting - Hygiene - Progress: Not met Miscellaneous OT  Goals Miscellaneous OT Goal #1: Pt will perform bed mobility with mod I in prep for EOB ADLs. OT Goal: Miscellaneous Goal #1 - Progress: Not met  OT Evaluation Precautions/Restrictions    Prior Functioning Home Living Lives With: Alone Receives Help From: Other (Comment) (brother and sister live in area but unable to help) Type of Home: House Home Layout: One level Home Access: Ramped entrance Bathroom Shower/Tub: Tub/shower unit;Curtain Bathroom Toilet: Handicapped height Bathroom Accessibility: Yes How Accessible: Accessible via walker Home Adaptive Equipment: Shower chair with back;Bedside commode/3-in-1;Walker - rolling;Straight cane;Hospital bed Prior Function Level of Independence: Independent with basic ADLs;Needs assistance with gait;Needs assistance with tranfers Able to Take Stairs?: No Driving: Yes ADL ADL Grooming: Performed;Brushing hair;Set up Grooming Details (indicate cue type and reason): Setup to gather grooming materials Where Assessed - Grooming: Sitting, chair;Supported Lower Body Bathing: Simulated;Moderate assistance Lower Body Bathing Details (indicate cue type and reason): Pt unable to reach bilateral feet when legs are crossed.   Where Assessed - Lower Body Bathing: Sitting, bed Lower Body Dressing: Simulated;Moderate assistance Lower Body Dressing Details (indicate cue type and reason): Pt unable to reach bilateral feet when legs are crossed. Where Assessed - Lower Body Dressing: Sitting, bed Toilet Transfer: Simulated;Moderate assistance Toilet Transfer Details (indicate cue type and reason): Pt ambulated from bed to chair. Toilet Transfer Method: Ambulating ADL Comments: Pt donned back brace with mod A sitting EOB. Vision/Perception    Cognition Cognition Arousal/Alertness: Awake/alert Overall Cognitive Status: Appears within functional limits for tasks assessed Orientation Level: Oriented X4 Sensation/Coordination Coordination Gross Motor  Movements are Fluid and Coordinated: Yes Fine Motor Movements are Fluid and Coordinated: No (Tremors in R hand) Extremity Assessment RUE Assessment RUE Assessment: Within Functional Limits LUE Assessment LUE Assessment: Within Functional Limits Mobility  Bed Mobility Bed Mobility: Yes Rolling Left: 3: Mod assist;With rail Left Sidelying to Sit: 3: Mod assist;HOB elevated (comment degrees);With rails (45 degrees) Sitting - Scoot to Edge of Bed: 4: Min assist Transfers Transfers: Yes Sit to Stand: 4: Min assist;From bed;With upper extremity assist Stand to Sit: 4: Min assist;To chair/3-in-1;With armrests;With upper extremity assist Exercises   End of Session OT - End of Session Equipment Utilized During Treatment: Gait belt;Back brace Activity Tolerance: Patient limited by fatigue;Other (comment) (fear of falling) Patient left: in chair;with call bell in reach Nurse Communication: Mobility status for transfers General Behavior During Session: [facility] for tasks performed Cognition: [facility] for tasks performed   Cipriano Mile 05/03/2011, 3:58 PM  05/03/2011 Cipriano Mile OTR/L Pager [phone] Office [phone]

## 2011-05-03 NOTE — Progress Notes (Addendum)
Physical Therapy Evaluation Patient Details Name: Leslie Hendricks MRN: [id number] DOB: Aug 19, 1940 Today's Date: 05/03/2011  Problem List:  Patient Active Problem List  Diagnoses  . DIABETES MELLITUS, TYPE II  . HYPERLIPIDEMIA  . MACULAR DEGENERATION  . HYPERTENSION  . LEG PAIN, BILATERAL  . OSTEOPOROSIS  . PALPITATIONS    Past Medical History:  Past Medical History  Diagnosis Date  . Diabetes mellitus   . Hypertension   . Cancer   . Uterine cancer dx'd 1991    surg only  . Arthritis   . Depression   . Back pain, chronic   . 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 Falls frequently     neurological workup inconclusive per pt   Past Surgical History:  Past Surgical History  Procedure Date  . Foot surgery     left toe  . Joint replacement   . Neck surgery 2009, 2010    x 2  . Nose surgery   . Back surgery 2002, 2003, 2006    x 3  . Abdominal hysterectomy 1991  . Hemorrhoid surgery   . Eye surgery     R eye     PT Assessment/Plan/Recommendation PT Assessment Clinical Impression Statement: Pt presents with a medical diagnosis of PLIF L2-3 along with the following impairments/deficits and therapy diagnosis listed below. Pt will benefit from skilled PT in the acute care setting in order to maximize functional mobility prior to d/c PT Recommendation/Assessment: Patient will need skilled PT in the acute care venue PT Problem List: Decreased activity tolerance;Decreased mobility;Pain;Decreased knowledge of precautions;Decreased knowledge of use of DME;Decreased balance Problem List Comments: prior balance history Barriers to Discharge: Decreased caregiver support PT Therapy Diagnosis : Abnormality of gait;Acute pain PT Plan PT Frequency: Min 5X/week PT Treatment/Interventions: DME instruction;Gait training;Functional mobility training;Therapeutic activities;Therapeutic exercise;Patient/family education PT Recommendation Follow Up Recommendations:  Skilled nursing facility;Inpatient Rehab (CIR vs. SNF) Equipment Recommended: Defer to next venue PT Goals  Acute Rehab PT Goals PT Goal Formulation: With patient Time For Goal Achievement: 7 days Pt will go Supine/Side to Sit: with modified independence PT Goal: Supine/Side to Sit - Progress: Goal set today Pt will go Sit to Supine/Side: with modified independence PT Goal: Sit to Supine/Side - Progress: Goal set today Pt will go Sit to Stand: with supervision PT Goal: Sit to Stand - Progress: Goal set today Pt will go Stand to Sit: with supervision PT Goal: Stand to Sit - Progress: Goal set today Pt will Transfer Bed to Chair/Chair to Bed: with supervision PT Transfer Goal: Bed to Chair/Chair to Bed - Progress: Goal set today Pt will Ambulate: 51 - 150 feet;with min assist;with rolling walker PT Goal: Ambulate - Progress: Goal set today  PT Evaluation Precautions/Restrictions    Prior Functioning  Home Living Lives With: Alone Receives Help From: Other (Comment) (brother and sister live in area but unable to help) Type of Home: House Home Layout: One level Home Access: Ramped entrance Bathroom Shower/Tub: Tub/shower unit;Curtain Bathroom Toilet: Handicapped height Bathroom Accessibility: Yes How Accessible: Accessible via walker Home Adaptive Equipment: Shower chair with back;Bedside commode/3-in-1;Walker - rolling;Straight cane;Hospital bed Prior Function Level of Independence: Independent with basic ADLs;Needs assistance with gait;Needs assistance with tranfers Able to Take Stairs?: No Driving: Yes Cognition Cognition Arousal/Alertness: Awake/alert Overall Cognitive Status: Appears within functional limits for tasks assessed Orientation Level: Oriented X4 Sensation/Coordination Coordination Gross Motor Movements are Fluid and Coordinated: Yes Fine Motor Movements are Fluid and  Coordinated: No (Tremors in R hand) Extremity Assessment RUE Assessment RUE Assessment:  Within Functional Limits LUE Assessment LUE Assessment: Within Functional Limits RLE Assessment RLE Assessment: Within Functional Limits LLE Assessment LLE Assessment: Within Functional Limits Mobility (including Balance) Bed Mobility Bed Mobility: Yes Rolling Left: 3: Mod assist;With rail Rolling Left Details (indicate cue type and reason): VC for hand placement. Assist with trunk control and stability onto side Left Sidelying to Sit: 3: Mod assist;HOB elevated (comment degrees);With rails (45 degrees) Left Sidelying to Sit Details (indicate cue type and reason): Mod assist to support trunk and legs Sitting - Scoot to Edge of Bed: 4: Min assist Sitting - Scoot to [place] of Bed Details (indicate cue type and reason): VC for hand placement and weight shifting Transfers Transfers: Yes Sit to Stand: 4: Min assist;From bed;With upper extremity assist Sit to Stand Details (indicate cue type and reason): VC for hand placement and sequencing Stand to Sit: 4: Min assist;To chair/3-in-1;With armrests;With upper extremity assist Stand to Sit Details: Manual and verbal cues for hand placement on arm rests Ambulation/Gait Ambulation/Gait: Yes Ambulation/Gait Assistance: 3: Mod assist Ambulation/Gait Assistance Details (indicate cue type and reason): Assist for control and stability with ambulation. Increased supervision secondary to pt with history of falling. Pt limited distance secondary to pain and fatigue Ambulation Distance (Feet): 5 Feet Assistive device: Rolling walker Gait Pattern: Step-to pattern;Decreased stride length;Decreased hip/knee flexion - right;Decreased hip/knee flexion - left Gait velocity: Slow cadence Stairs: No    Exercise    End of Session PT - End of Session Equipment Utilized During Treatment: Gait belt Activity Tolerance: Patient limited by pain Patient left: in chair;with call bell in reach Nurse Communication: Mobility status for transfers;Mobility status for  ambulation General Behavior During Session: [facility] for tasks performed Cognition: [facility] for tasks performed  Milana Kidney 05/03/2011, 5:17 PM  05/03/2011 Milana Kidney DPT PAGER: [phone] OFFICE: [phone]

## 2011-05-03 NOTE — Consult Note (Signed)
Physical Medicine and Rehabilitation Consult Reason for Consult: Lumbar stenosis with radiculopathy Referring Phsyician: Dr. Marveen Reeks is an 71 y.o. female.   HPI: 71 year old right-handed white female with chronic history of back and leg pain as well as previous lumbar fusions. Presented January 17 with recurrent back and hip pain radiating to the lower extremity. Imaging and myelogram showed lumbar stenosis with radiculopathy at lumbar 2 and 3. Underwent bilateral lumbar 2 and 3 laminotomies foraminotomies to decompress the bilateral lumbar L2 and III nerve roots on January 17 per Dr. Lovell Sheehan. Postoperative pain management. Physical therapy and occupational therapy were pending. A medicine and rehabilitation was consulted for consideration of inpatient rehabilitation services  Review of Systems  Eyes: Negative for double vision.  Respiratory: Negative for cough and shortness of breath.   Cardiovascular: Negative for chest pain.  Gastrointestinal: Positive for constipation. Negative for nausea.  Genitourinary: Negative for dysuria.  Musculoskeletal: Positive for back pain and joint pain.  Neurological: Positive for weakness. Negative for headaches.   Past Medical History  Diagnosis Date  . Diabetes mellitus   . Hypertension   . Cancer   . Uterine cancer dx'd 1991    surg only  . Arthritis   . Depression   . Back pain, chronic   . 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 Falls frequently     neurological workup inconclusive per pt   Past Surgical History  Procedure Date  . Foot surgery     left toe  . Joint replacement   . Neck surgery 2009, 2010    x 2  . Nose surgery   . Back surgery 2002, 2003, 2006    x 3  . Abdominal hysterectomy 1991  . Hemorrhoid surgery   . Eye surgery     R eye    History reviewed. No pertinent family history. Social History:  reports that she has quit smoking. She does not have any smokeless tobacco history on  file. She reports that she does not drink alcohol or use illicit drugs. Allergies:  Allergies  Allergen Reactions  . Banana Nausea And Vomiting  . Codeine Nausea And Vomiting  . Oxycontin Nausea And Vomiting   Medications Prior to Admission  Medication Dose Route Frequency Provider Last Rate Last Dose  . acetaminophen (TYLENOL) tablet 650 mg  650 mg Oral Q4H PRN Cristi Loron, MD       Or  . acetaminophen (TYLENOL) suppository 650 mg  650 mg Rectal Q4H PRN Cristi Loron, MD      . bacitracin 16109 UNITS injection           . bupivacaine liposome (EXPAREL) 1.3 % injection 266 mg  20 mL Infiltration To NeurOR Cristi Loron, MD   20 mL at 05/02/11 1034  . ceFAZolin (ANCEF) IVPB 1 g/50 mL premix  1 g Intravenous Q8H Cristi Loron, MD   1 g at 05/02/11 2228  . ceFAZolin (ANCEF) IVPB 2 g/50 mL premix  2 g Intravenous 60 min Pre-Op Benny Lennert, PHARMD   2 g at 05/02/11 0758  . diazepam (VALIUM) tablet 5 mg  5 mg Oral QHS Cristi Loron, MD   5 mg at 05/02/11 2228  . diazepam (VALIUM) tablet 5 mg  5 mg Oral Q6H PRN Cristi Loron, MD      . docusate sodium (COLACE) capsule 100 mg  100 mg Oral BID Cristi Loron, MD  100 mg at 05/02/11 2228  . gatifloxacin (ZYMAXID) 0.5 % ophthalmic drops 1 drop  1 drop Both Eyes QID Cristi Loron, MD   1 drop at 05/02/11 1744  . hydrochlorothiazide (HYDRODIURIL) tablet 25 mg  25 mg Oral Daily Cristi Loron, MD   25 mg at 05/02/11 1647  . olmesartan (BENICAR) tablet 40 mg  40 mg Oral Daily Cristi Loron, MD   40 mg at 05/02/11 1645   And  . hydrochlorothiazide (MICROZIDE) capsule 12.5 mg  12.5 mg Oral Daily Cristi Loron, MD   12.5 mg at 05/02/11 1645  . HYDROmorphone (DILAUDID) 1 MG/ML injection           . HYDROmorphone (DILAUDID) tablet 2 mg  2 mg Oral Q4H PRN Cristi Loron, MD   2 mg at 05/03/11 0414  . insulin aspart (novoLOG) injection 0-20 Units  0-20 Units Subcutaneous Q4H Cristi Loron, MD   3 Units  at 05/03/11 0414  . lactated ringers infusion   Intravenous Continuous Cristi Loron, MD      . menthol-cetylpyridinium (CEPACOL) lozenge 3 mg  1 lozenge Oral PRN Cristi Loron, MD       Or  . phenol [facility]) mouth spray 1 spray  1 spray Mouth/Throat PRN Cristi Loron, MD      . metFORMIN (GLUCOPHAGE-XR) 24 hr tablet 500 mg  500 mg Oral Q breakfast Cristi Loron, MD      . multivitamin-lutein [facility]) capsule 1 capsule  1 capsule Oral Daily Cristi Loron, MD   1 capsule at 05/02/11 1646  . ondansetron (ZOFRAN) injection 4 mg  4 mg Intravenous Q4H PRN Cristi Loron, MD      . rosuvastatin (CRESTOR) tablet 40 mg  40 mg Oral Daily Cristi Loron, MD   40 mg at 05/02/11 1650  . sodium chloride 0.9 % infusion           . venlafaxine (EFFEXOR-XR) 24 hr capsule 75 mg  75 mg Oral Daily Cristi Loron, MD      . zolpidem [facility]) tablet 5 mg  5 mg Oral QHS PRN Cristi Loron, MD      . DISCONTD: 0.9 % irrigation (POUR BTL)    PRN Cristi Loron, MD   1,000 mL at 05/02/11 0710  . DISCONTD: bacitracin 50,000 Units in sodium chloride irrigation 0.9 % 500 mL irrigation    PRN Cristi Loron, MD      . DISCONTD: bacitracin ointment    PRN Cristi Loron, MD   1 application at 05/02/11 0000  . DISCONTD: Bupivacaine-Epinephrine PF (MARCAINE W/ EPI (PF)) 0.5-1:200000 % injection    PRN Cristi Loron, MD   10 mL at 05/02/11 0811  . DISCONTD: hemostatic agents    PRN Cristi Loron, MD   1 application at 05/02/11 0720  . DISCONTD: HYDROmorphone (DILAUDID) injection 0.25-0.5 mg  0.25-0.5 mg Intravenous Q5 min PRN Raiford Simmonds, MD   0.5 mg at 05/02/11 1237  . DISCONTD: ondansetron (ZOFRAN) injection 4 mg  4 mg Intravenous Q6H PRN Raiford Simmonds, MD      . DISCONTD: telmisartan-hydrochlorothiazide (MICARDIS HCT) 80-12.5 MG per tablet 1 tablet  1 tablet Oral Daily Cristi Loron, MD      . DISCONTD: thrombin spray    PRN Cristi Loron, MD    20,000 Units at 05/02/11 0720   Medications Prior to Admission  Medication Sig Dispense  Refill  . Ascorbic Acid (VITAMIN C WITH ROSE HIPS) 500 MG tablet Take 500 mg by mouth daily.        Marland Kitchen aspirin 325 MG tablet Take 325 mg by mouth daily.        Marland Kitchen atorvastatin (LIPITOR) 80 MG tablet Take 80 mg by mouth daily.        . cholecalciferol (VITAMIN D) 400 UNITS TABS Take 400 Units by mouth daily.        . diazepam (VALIUM) 5 MG tablet Take 5 mg by mouth at bedtime.       . fish oil-omega-3 fatty acids 1000 MG capsule Take 1 g by mouth daily.        . folic acid (FOLVITE) 400 MCG tablet Take 400 mcg by mouth daily.        . hydrochlorothiazide (HYDRODIURIL) 25 MG tablet Take 25 mg by mouth daily.       Marland Kitchen HYDROcodone-acetaminophen (NORCO) 10-325 MG per tablet Take 1-2 tablets by mouth at bedtime as needed. PAIN      . metFORMIN (GLUMETZA) 500 MG (MOD) 24 hr tablet Take 500 mg by mouth daily with breakfast.        . moxifloxacin (VIGAMOX) 0.5 % ophthalmic solution Place 1 drop into the left eye 3 (three) times daily. DAY OF AND 2 DAYS AFTER MONTHLY EYE INJECTION       . multivitamin-lutein (OCUVITE-LUTEIN) CAPS Take 1 capsule by mouth daily.        Marland Kitchen OVER THE COUNTER MEDICATION Take 2 tablets by mouth daily. "EQUATE" STIMULANT LAXATIVE FOR CONSTIPATION      . OVER THE COUNTER MEDICATION Take 1 tablet by mouth daily. MAGNESIUM 250MG       . telmisartan-hydrochlorothiazide (MICARDIS HCT) 80-12.5 MG per tablet Take 1 tablet by mouth daily.        Marland Kitchen venlafaxine (EFFEXOR-XR) 75 MG 24 hr capsule Take 75 mg by mouth daily.       . vitamin E 400 UNIT capsule Take 400 Units by mouth daily.        Marland Kitchen zinc gluconate 50 MG tablet Take 50 mg by mouth daily.          Home:    Functional History:   Functional Status:  Mobility:          ADL:    Cognition: Cognition Orientation Level: Oriented X4 Cognition Orientation Level: Oriented X4  Blood pressure 96/51, pulse 70, temperature 98.1 F (36.7  C), temperature source Oral, resp. rate 18, height 5\' 4"  (1.626 m), weight 102.099 kg (225 lb 1.4 oz), SpO2 93.00%. Physical Exam  Constitutional: She is oriented to person, place, and time. She appears well-developed.  HENT:  Head: Normocephalic.  Neck: Normal range of motion. Neck supple. Thyromegaly present.  Cardiovascular: Normal rate and regular rhythm.   Pulmonary/Chest: Effort normal. She has no wheezes.  Abdominal: There is no tenderness.  Musculoskeletal: She exhibits no edema.  Neurological: She is alert and oriented to person, place, and time.  Skin:       Incision clean and dry  Psychiatric: She has a normal mood and affect.   motor strength in the upper extremities is 4+ out of 5 the deltoid by stress of grip. Motor strength in lower extremity is 2 minus in the hip flexors 3 minus right knee extensor 2 minus left knee extensor 2 minus left ankle dorsiflexor 3 minus right ankle dorsiflexor. Sensory is intact to light touch in bilateral extremities. Extremities without edema Mood  and affect are appropriate  Results for orders placed during the hospital encounter of 05/02/11 (from the past 24 hour(s))  GLUCOSE, CAPILLARY     Status: Abnormal   Collection Time   05/02/11 11:05 AM      Component Value Range   Glucose-Capillary 136 (*) 70 - 99 (mg/dL)   Comment 1 Documented in Chart     Comment 2 Notify RN    GLUCOSE, CAPILLARY     Status: Abnormal   Collection Time   05/02/11  4:23 PM      Component Value Range   Glucose-Capillary 166 (*) 70 - 99 (mg/dL)  GLUCOSE, CAPILLARY     Status: Abnormal   Collection Time   05/02/11  7:59 PM      Component Value Range   Glucose-Capillary 175 (*) 70 - 99 (mg/dL)  GLUCOSE, CAPILLARY     Status: Abnormal   Collection Time   05/02/11 11:57 PM      Component Value Range   Glucose-Capillary 139 (*) 70 - 99 (mg/dL)  GLUCOSE, CAPILLARY     Status: Abnormal   Collection Time   05/03/11  4:09 AM      Component Value Range    Glucose-Capillary 128 (*) 70 - 99 (mg/dL)  CBC     Status: Abnormal   Collection Time   05/03/11  4:35 AM      Component Value Range   WBC 8.8  4.0 - 10.5 (K/uL)   RBC 3.14 (*) 3.87 - 5.11 (MIL/uL)   Hemoglobin 10.2 (*) 12.0 - 15.0 (g/dL)   HCT 16.1 (*) 09.6 - 46.0 (%)   MCV 99.4  78.0 - 100.0 (fL)   MCH 32.5  26.0 - 34.0 (pg)   MCHC 32.7  30.0 - 36.0 (g/dL)   RDW 04.5  40.9 - 81.1 (%)   Platelets 197  150 - 400 (K/uL)  BASIC METABOLIC PANEL     Status: Abnormal   Collection Time   05/03/11  4:35 AM      Component Value Range   Sodium 137  135 - 145 (mEq/L)   Potassium 3.9  3.5 - 5.1 (mEq/L)   Chloride 101  96 - 112 (mEq/L)   CO2 31  19 - 32 (mEq/L)   Glucose, Bld 133 (*) 70 - 99 (mg/dL)   BUN 11  6 - 23 (mg/dL)   Creatinine, Ser 9.14  0.50 - 1.10 (mg/dL)   Calcium 8.9  8.4 - 78.2 (mg/dL)   GFR calc non Af Amer 82 (*) >90 (mL/min)   GFR calc Af Amer >90  >90 (mL/min)  GLUCOSE, CAPILLARY     Status: Abnormal   Collection Time   05/03/11  7:58 AM      Component Value Range   Glucose-Capillary 113 (*) 70 - 99 (mg/dL)   Dg Lumbar Spine 2-3 Views  05/02/2011  *RADIOLOGY REPORT*  Clinical Data: Back pain  LUMBAR SPINE - 2-3 VIEW  Comparison: CT myelogram 03/12/2011.  Findings: Intraoperative C-arm films document extension of the previous L3-S1 fusion upward to the L2-L3 interspace, by placing pedicle screws bilaterally at L2.  No gross adverse features.  IMPRESSION:  As above.  Original Report Authenticated By: Elsie Stain, M.D.    Assessment/Plan: 1. Diagnosis: Lumbar stenosis status post L2-3 decompressed of laminectomy and foraminotomy postoperative day 1 Does the need for close, 24 hr/day medical supervision in concert with the patient's rehab needs make it unreasonable for this patient to be served in a less  intensive setting? Potentially 2. Co-Morbidities requiring supervision/potential complications: Diabetes type 2, maxilla degeneration, osteoporosis, hypertension, poor  balance history of falls 3. Due to bladder management, bowel management, safety, skin/wound care, disease management, medication administration and pain management, does the patient require 24 hr/day rehab nursing? Yes 4. Does the patient require coordinated care of a physician, rehab nurse, PT (1-2 hrs/day, 5 days/week) and OT (1-2 hrs/day, 5 days/week) to address physical and functional deficits in the context of the above medical diagnosis(es)? Potentially Addressing deficits in the following areas: balance, dressing, endurance, grooming, strength, toileting and transferring 5. Can the patient actively participate in an intensive therapy program of at least 3 hrs of therapy per day at least 5 days per week? Potentially 6. The potential for patient to make measurable gains while on inpatient rehab is good 7. Anticipated functional outcomes upon discharge from inpatients are would need to be modified independent with mobility PT, would need to be modified independent with ADLs OT, not applicable SLP 8. Estimated rehab length of stay to reach the above functional goals is: 10 days 9. Does the patient have adequate social supports to accommodate these discharge functional goals? Potentially 10. Anticipated D/C setting: Home 11. Anticipated post D/C treatments: HH therapy 12. Overall Rehab/Functional Prognosis: good  RECOMMENDATIONS: This patient's condition is appropriate for continued rehabilitative care in the following setting: Rehabilitation admission nurse to follow patient as she goes through therapy. Patient has potential to reach mod I  C IR is a good option for her. If not then short-term SNF Patient has agreed to participate in recommended program. Potentially Note that insurance prior authorization may be required for reimbursement for recommended care.  Comment:   ANGIULLI,DANIEL J. 05/03/2011

## 2011-05-03 NOTE — Progress Notes (Signed)
Patient is POD #1. I will follow up with her progress  Monday to determine if patient most appropriate for CIR vs SNF rehab depending on her potential to reach Mod I after a short rehab stay. Please call me with any questions. Pager [phone].

## 2011-05-03 NOTE — Progress Notes (Signed)
Clinical Social Worker received consult for "SNF." Awaiting PT eval for discharge recommendations. CSW will assess once PT has made recommendations for discharge. CSW will also consult with CIR. CSW will continue to follow.  Dede Query, MSW, Theresia Majors [phone]

## 2011-05-04 LAB — CBC
HCT: 34.1 % — ABNORMAL LOW (ref 36.0–46.0)
Hemoglobin: 11.1 g/dL — ABNORMAL LOW (ref 12.0–15.0)
MCH: 32.6 pg (ref 26.0–34.0)
MCHC: 32.6 g/dL (ref 30.0–36.0)
MCV: 100 fL (ref 78.0–100.0)
Platelets: 206 10*3/uL (ref 150–400)
RBC: 3.41 MIL/uL — ABNORMAL LOW (ref 3.87–5.11)
RDW: 14.1 % (ref 11.5–15.5)
WBC: 10.5 10*3/uL (ref 4.0–10.5)

## 2011-05-04 LAB — GLUCOSE, CAPILLARY
Glucose-Capillary: 132 mg/dL — ABNORMAL HIGH (ref 70–99)
Glucose-Capillary: 83 mg/dL (ref 70–99)
Glucose-Capillary: 122 mg/dL — ABNORMAL HIGH (ref 70–99)
Glucose-Capillary: 99 mg/dL (ref 70–99)
Glucose-Capillary: 91 mg/dL (ref 70–99)
Glucose-Capillary: 106 mg/dL — ABNORMAL HIGH (ref 70–99)

## 2011-05-04 LAB — BASIC METABOLIC PANEL
BUN: 16 mg/dL (ref 6–23)
CO2: 31 mEq/L (ref 19–32)
Calcium: 8.6 mg/dL (ref 8.4–10.5)
Chloride: 96 mEq/L (ref 96–112)
Creatinine, Ser: 0.9 mg/dL (ref 0.50–1.10)
GFR calc Af Amer: 72 mL/min — ABNORMAL LOW (ref >90)
GFR calc non Af Amer: 62 mL/min — ABNORMAL LOW (ref >90)
Glucose, Bld: 144 mg/dL — ABNORMAL HIGH (ref 70–99)
Potassium: 3.4 mEq/L — ABNORMAL LOW (ref 3.5–5.1)
Sodium: 135 mEq/L (ref 135–145)

## 2011-05-04 MED ORDER — POTASSIUM CHLORIDE CRYS ER 20 MEQ PO TBCR
20.0000 meq | EXTENDED_RELEASE_TABLET | Freq: Once | ORAL | Status: AC
  Administered 2011-05-04: 20 meq via ORAL
  Filled 2011-05-04: qty 1

## 2011-05-04 MED ORDER — HYDROCODONE-ACETAMINOPHEN 5-325 MG PO TABS
1.0000 | ORAL_TABLET | ORAL | Status: DC | PRN
  Administered 2011-05-07: 1 via ORAL
  Filled 2011-05-04: qty 2
  Filled 2011-05-04: qty 1

## 2011-05-04 MED ORDER — TRAMADOL HCL 50 MG PO TABS
50.0000 mg | ORAL_TABLET | ORAL | Status: DC | PRN
  Administered 2011-05-05 – 2011-05-06 (×3): 50 mg via ORAL
  Filled 2011-05-04 (×3): qty 1

## 2011-05-04 NOTE — Progress Notes (Signed)
Physical Therapy Treatment Patient Details Name: KERRILYN AZBILL MRN: [id number] DOB: 10-25-1940 Today's Date: 05/04/2011  PT Assessment/Plan  PT - Assessment/Plan Comments on Treatment Session: slowly progressing with mobiltiy. PT Plan: Discharge plan remains appropriate;Frequency remains appropriate PT Frequency: Min 5X/week Follow Up Recommendations: Skilled nursing facility;Inpatient Rehab (CIR vs SNF) Equipment Recommended: Defer to next venue PT Goals  Acute Rehab PT Goals PT Goal: Supine/Side to Sit - Progress: Progressing toward goal PT Goal: Sit to Stand - Progress: Progressing toward goal PT Goal: Stand to Sit - Progress: Progressing toward goal PT Goal: Ambulate - Progress: Progressing toward goal  PT Treatment Precautions/Restrictions  Precautions Precautions: Back Precaution Comments: reducated pt on 3/3 back precautions Required Braces or Orthoses: Yes Spinal Brace: Lumbar corset;Applied in sitting position (total assist to don brace at EOB) Restrictions Weight Bearing Restrictions: No Mobility (including Balance) Bed Mobility Rolling Left: With rail;4: Min assist Rolling Left Details (indicate cue type and reason): cues for hand placement and technique Left Sidelying to Sit: With rails;HOB flat;4: Min assist Left Sidelying to Sit Details (indicate cue type and reason): cues for technique and assist for trunk transition into sitting positon Sitting - Scoot to Edge of Bed: 4: Min assist Sitting - Scoot to [place] of Bed Details (indicate cue type and reason): cues for use of hands with scooting. pad under pt used to assist with scooting to edge of bed Transfers Sit to Stand: 1: +2 Total assist;Patient percentage (comment);With upper extremity assist (pt =60%) Sit to Stand Details (indicate cue type and reason): cues for safe hand placement and for ant wt shifting to stand. Stand to Sit: 4: Min assist;With upper extremity assist;To chair/3-in-1 Stand to Sit Details:  cues for safe hand placement and to back up all the way to the chair surface Ambulation/Gait Ambulation/Gait Assistance: 3: Mod assist Ambulation/Gait Assistance Details (indicate cue type and reason): cues to stay upright, for walker use/safety and to advance feet towards chair Ambulation Distance (Feet): 5 Feet Assistive device: Rolling walker Gait Pattern: Step-to pattern;Decreased stride length;Decreased step length - right;Decreased step length - left;Trunk flexed    Exercise    End of Session PT - End of Session Equipment Utilized During Treatment: Gait belt;Back brace Activity Tolerance: Patient limited by pain Patient left: in chair;with call bell in reach Nurse Communication: Mobility status for transfers;Mobility status for ambulation General Behavior During Session: [facility] for tasks performed Cognition: [facility] for tasks performed  Sallyanne Kuster 05/04/2011, 2:29 PM  Sallyanne Kuster, PTA Office- [phone]

## 2011-05-04 NOTE — Progress Notes (Signed)
Doing well. C/o appropriate incisional soreness.  No Numbness, tingling, weakness No Nausea /vomiting Working with PT  Temp:  [97 F (36.1 C)-99.7 F (37.6 C)] 99.7 F (37.6 C) (01/19 0804) Pulse Rate:  [77-98] 98  (01/19 0804) Resp:  [18-19] 18  (01/19 0804) BP: (99-120)/(59-70) 112/66 mmHg (01/19 0804) SpO2:  [91 %-97 %] 92 % (01/19 0804) Good strength and sensation Incision CDI  Plan: Increase activity, ?rehab next week

## 2011-05-05 LAB — GLUCOSE, CAPILLARY
Glucose-Capillary: 103 mg/dL — ABNORMAL HIGH (ref 70–99)
Glucose-Capillary: 110 mg/dL — ABNORMAL HIGH (ref 70–99)
Glucose-Capillary: 110 mg/dL — ABNORMAL HIGH (ref 70–99)
Glucose-Capillary: 144 mg/dL — ABNORMAL HIGH (ref 70–99)
Glucose-Capillary: 120 mg/dL — ABNORMAL HIGH (ref 70–99)
Glucose-Capillary: 113 mg/dL — ABNORMAL HIGH (ref 70–99)

## 2011-05-05 MED ORDER — INSULIN ASPART 100 UNIT/ML ~~LOC~~ SOLN
0.0000 [IU] | Freq: Three times a day (TID) | SUBCUTANEOUS | Status: DC
  Administered 2011-05-06: 4 [IU] via SUBCUTANEOUS
  Administered 2011-05-07 (×2): 3 [IU] via SUBCUTANEOUS
  Filled 2011-05-05: qty 3

## 2011-05-05 MED ORDER — PROSIGHT PO TABS
1.0000 | ORAL_TABLET | Freq: Every day | ORAL | Status: DC
  Administered 2011-05-05 – 2011-05-07 (×3): 1 via ORAL
  Filled 2011-05-05 (×2): qty 1

## 2011-05-05 NOTE — Progress Notes (Signed)
CSW met with pt RE: SNF as b/u plan if pt not appropriate for CIR. Pt agreeable to SNF search, but hopeful CIR admission possible. Weekday CSW will f/u on CIR decision and present offers if needed.  Leslie Hendricks, MSW, [place] [phone] (weekend)

## 2011-05-05 NOTE — Progress Notes (Signed)
Subjective: Patient reports incisional pain , less confusion  Objective: Vital signs in last 24 hours: Temp:  [98.1 F (36.7 C)-99.2 F (37.3 C)] 99.2 F (37.3 C) (01/20 0711) Pulse Rate:  [83-97] 88  (01/20 0711) Resp:  [16-20] 18  (01/20 0711) BP: (90-104)/(56-62) 98/61 mmHg (01/20 0711) SpO2:  [92 %-98 %] 92 % (01/20 0711)  Intake/Output from previous day: 01/19 0701 - 01/20 0700 In: 660 [P.O.:660] Out: 2200 [Urine:2200] Intake/Output this shift:    A/A/Ox2plus , moves all well Less confused,conversant  Lab Results:  [facility] 05/04/11 1415 05/03/11 0435  WBC 10.5 8.8  HGB 11.1* 10.2*  HCT 34.1* 31.2*  PLT 206 197   BMET  Basename 05/04/11 1415 05/03/11 0435  NA 135 137  K 3.4* 3.9  CL 96 101  CO2 31 31  GLUCOSE 144* 133*  BUN 16 11  CREATININE 0.90 0.78  CALCIUM 8.6 8.9    Studies/Results: No results found.  Assessment/Plan: Less confused,continue increasing activity  LOS: 3 days     Jocee Kissick R, MD 05/05/2011, 9:43 AM

## 2011-05-06 LAB — GLUCOSE, CAPILLARY
Glucose-Capillary: 100 mg/dL — ABNORMAL HIGH (ref 70–99)
Glucose-Capillary: 161 mg/dL — ABNORMAL HIGH (ref 70–99)
Glucose-Capillary: 110 mg/dL — ABNORMAL HIGH (ref 70–99)
Glucose-Capillary: 115 mg/dL — ABNORMAL HIGH (ref 70–99)

## 2011-05-06 NOTE — Progress Notes (Signed)
Occupational Therapy Treatment Patient Details Name: Leslie Hendricks MRN: [id number] DOB: [birth year] Today's Date: 05/06/2011  OT Assessment/Plan OT Assessment/Plan Comments on Treatment Session: Pt's sister called during session sharing her concerns about her ongoing confusion and her current meds; gave the RN sisters phone # to call her back and answer her questions. Sister was pleased with having RN call her back.  Pt reports still feeling foggy and unclear,.  Session focused on bed mobility, donning bace EOB, sit to stand, stand to sit, functional ambulation with RW, balance with bilateral UE support.  With functional ambulation pt has mulitple LOB laterally and posterior requiring min A to regain balance.  Pt reports many falls PTA at home, and using her life line for A to get up.  Recommend supervision goals and to have supervision at home when d/c. OT Plan: Equipment recommendations need to be updated;Discharge plan remains appropriate OT Frequency: Min 2X/week Follow Up Recommendations: Supervision/Assistance - 24 hour (?? SNF) OT Goals ADL Goals Pt Will Perform Grooming: with supervision;Standing at sink ADL Goal: Grooming - Progress: Revised due to lack of progress (and decreased balance and safety) Pt Will Perform Lower Body Bathing: with supervision;Sit to stand from chair;Sit to stand from bed ADL Goal: Lower Body Bathing - Progress: Revised due to lack of progress Pt Will Perform Lower Body Dressing: with supervision;with adaptive equipment;Sit to stand from chair;Sit to stand from bed ADL Goal: Lower Body Dressing - Progress: Revised due to lack of progress Pt Will Transfer to Toilet: with supervision;Ambulation;Drop arm 3-in-1;Maintaining back safety precautions ADL Goal: Toilet Transfer - Progress: Revised due to lack of progress Pt Will Perform Toileting - Clothing Manipulation: with set-up;Standing ADL Goal: Toileting - Clothing Manipulation - Progress: Revised due to lack of  progress Pt Will Perform Toileting - Hygiene: with supervision;Sit to stand from 3-in-1/toilet;Standing at 3-in-1/toilet Miscellaneous OT Goals Miscellaneous OT Goal #1: Pt will perform bed mobility in prep to don brace and perform ADLs with supervision  OT Treatment Precautions/Restrictions  Precautions Precautions: Fall;Back Precaution Comments: continued education on back precautions Spinal Brace: Lumbar corset;Applied in sitting position (total A to don back brace) Restrictions Weight Bearing Restrictions: No   ADL ADL Toilet Transfer: Performed;Minimal assistance Toilet Transfer Method: Proofreader: Raised toilet seat with arms (or 3-in-1 over toilet) Toileting - Clothing Manipulation: Performed;Moderate assistance Toileting - Hygiene:  (unable to void- however would have needed A for hygeiene) Mobility  Bed Mobility Rolling Left: 4: Min assist Left Sidelying to Sit: HOB flat;With rails;4: Min assist Left Sidelying to Sit Details (indicate cue type and reason): max cuing Sitting - Scoot to Edge of Bed: 4: Min assist Transfers Transfers: Yes Sit to Stand: 3: Mod assist Stand to Sit: 4: Min assist Stand to Sit Details: cuing for hand placement  End of Session OT - End of Session Equipment Utilized During Treatment: Back brace (RW) Activity Tolerance: Patient limited by pain Patient left: in chair;with call bell in reach;with family/visitor present Nurse Communication: Mobility status for transfers General Behavior During Session: [facility] for tasks performed Cognition: [facility] for tasks performed (pt reports being "foggy"/ not clear)  Melonie Florida  05/06/2011, 11:24 AM

## 2011-05-06 NOTE — Progress Notes (Signed)
CSW met with pt and family to follow up with bed offers. CSW will follow up with "considering offer" and expand search. CSW provided education around the need for SNF. CSW will continue to follow.   Dede Query, MSW, Theresia Majors [phone]

## 2011-05-06 NOTE — Progress Notes (Signed)
Physical Therapy Treatment Patient Details Name: Leslie Hendricks MRN: [id number] DOB: [birth year] Today's Date: 05/06/2011  PT Assessment/Plan  PT - Assessment/Plan Comments on Treatment Session: Pt still slowly progressing. She was slightly confused this afternoon according to RN and was unhappy with the way things have been going. Plan for SNF for additional rehab secondary to slow progression PT Plan: Discharge plan remains appropriate;Frequency remains appropriate PT Frequency: Min 5X/week Follow Up Recommendations: Skilled nursing facility;Inpatient Rehab Equipment Recommended: Defer to next venue PT Goals  Acute Rehab PT Goals PT Goal Formulation: With patient PT Goal: Supine/Side to Sit - Progress: Not progressing PT Goal: Sit to Supine/Side - Progress: Not progressing PT Goal: Sit to Stand - Progress: Not progressing PT Goal: Stand to Sit - Progress: Not progressing PT Transfer Goal: Bed to Chair/Chair to Bed - Progress: Not progressing PT Goal: Ambulate - Progress: Not progressing  PT Treatment Precautions/Restrictions  Precautions Precautions: Fall;Back Precaution Comments: continued education on back precautions (pt unable to recall back precautions) Required Braces or Orthoses: Yes Spinal Brace: Lumbar corset;Applied in sitting position Restrictions Weight Bearing Restrictions: No Mobility (including Balance) Bed Mobility Bed Mobility: Yes Rolling Left: 4: Min assist;6: Modified independent (Device/Increase time);With rail Left Sidelying to Sit: HOB flat;With rails;3: Mod assist Left Sidelying to Sit Details (indicate cue type and reason): Mod assist for trunk control into sitting. VC for sequencing and hand placement Sitting - Scoot to Edge of Bed: 3: Mod assist Sitting - Scoot to Edge of Bed Details (indicate cue type and reason): Assist with drawsheet to get to edge of bed Transfers Transfers: Yes Sit to Stand: 3: Mod assist;From bed;From chair/3-in-1 Sit to  Stand Details (indicate cue type and reason): VC for hand placement. Pt with difficulty with anterior translation to stand Stand to Sit: 3: Mod assist;With upper extremity assist;To chair/3-in-1 Stand to Sit Details: VC for hand placement. Pt unable to control descent Ambulation/Gait Ambulation/Gait: Yes Ambulation/Gait Assistance: 3: Mod assist Ambulation/Gait Assistance Details (indicate cue type and reason): Mod assist for stability and control. Pt limited distance secondary to pain as well as fatigue and "swimminess" while standing Ambulation Distance (Feet): 15 Feet (two trials) Assistive device: Rolling walker Gait Pattern: Step-to pattern;Decreased stride length;Decreased step length - right;Decreased step length - left;Trunk flexed Gait velocity: Slow cadence Stairs: No    Exercise    End of Session PT - End of Session Equipment Utilized During Treatment: Gait belt;Back brace Activity Tolerance: Patient limited by pain Patient left: in chair;with call bell in reach Nurse Communication: Mobility status for transfers;Mobility status for ambulation General Behavior During Session: [facility] for tasks performed Cognition: [facility] for tasks performed  Milana Kidney 05/06/2011, 5:26 PM  05/06/2011 Milana Kidney DPT PAGER: [phone] OFFICE: [phone]

## 2011-05-06 NOTE — Progress Notes (Signed)
   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 PT recommended CIR VS SNF. CIR saw pt and declined due to felt like will need 24/7 supervision at d/c & not available.     Action/Plan:   CM spoke to CSW Maralyn Sago and she will give bed offers.   Anticipated DC Date:  05/09/2011   Anticipated DC Plan:  SKILLED NURSING FACILITY  In-house referral  Clinical Social Worker      DC Planning Services  CM consult      Choice offered to / List presented to:             Status of service:  Completed, signed off Medicare Important Message given?   (If response is "NO", the following Medicare IM given date fields will be blank) Date Medicare IM given:   Date Additional Medicare IM given:    Discharge Disposition:  SKILLED NURSING FACILITY  Per UR Regulation:  Reviewed for med. necessity/level of care/duration of stay  Comments:  05-06-11 [address] [place], [place] [phone] CM will continue to monitor for d/c disposition.

## 2011-05-06 NOTE — Progress Notes (Signed)
Patient ID: Leslie Hendricks, female   DOB: Sep 23, 1940, 71 y.o.   MRN: [id number]   Subjective: This is a note from when I saw the patient approximately 0 730 this morning. The patient is alert and pleasant. She is awaiting rehabilitation versus skilled nursing facility placement. She has no complaints.  Objective: Vital signs in last 24 hours: Temp:  [98.1 F (36.7 C)-98.9 F (37.2 C)] 98.8 F (37.1 C) (01/21 1400) Pulse Rate:  [74-97] 90  (01/21 1400) Resp:  [18-20] 18  (01/21 1400) BP: (95-111)/(56-67) 95/60 mmHg (01/21 1400) SpO2:  [91 %-95 %] 92 % (01/21 1400)  Intake/Output from previous day: 01/20 0701 - 01/21 0700 In: 480 [P.O.:480] Out: 1850 [Urine:1850] Intake/Output this shift: Total I/O In: 640 [P.O.:640] Out: 400 [Urine:400]  Physical exam patient is alert and oriented. She is moving 4 extremities well.  Lab Results:  [facility] 05/04/11 1415  WBC 10.5  HGB 11.1*  HCT 34.1*  PLT 206   BMET  Basename 05/04/11 1415  NA 135  K 3.4*  CL 96  CO2 31  GLUCOSE 144*  BUN 16  CREATININE 0.90  CALCIUM 8.6    Studies/Results: No results found.  Assessment/Plan: Postop day #4: We are awaiting placement.  LOS: 4 days     Cherokee Clowers D 05/06/2011, 5:02 PM

## 2011-05-06 NOTE — Progress Notes (Signed)
Feel patient will need 24/7 supervision at d/c which is not available. Do not feel patient can reach Mod I level after 1 to 2 weeks of inpatient acute rehabilitation therefore we are recommending SNF. I will alert SW. Please call for any questions. Pager [phone]

## 2011-05-07 LAB — GLUCOSE, CAPILLARY
Glucose-Capillary: 107 mg/dL — ABNORMAL HIGH (ref 70–99)
Glucose-Capillary: 138 mg/dL — ABNORMAL HIGH (ref 70–99)
Glucose-Capillary: 122 mg/dL — ABNORMAL HIGH (ref 70–99)

## 2011-05-07 MED ORDER — DIAZEPAM 5 MG PO TABS: 2.0000 mg | ORAL_TABLET | Freq: Four times a day (QID) | ORAL | Status: AC | PRN

## 2011-05-07 MED ORDER — BISACODYL 10 MG RE SUPP: 10.0000 mg | Freq: Every day | RECTAL | Status: DC | PRN

## 2011-05-07 MED ORDER — HYDROCODONE-ACETAMINOPHEN 5-325 MG PO TABS: 1.0000 | ORAL_TABLET | ORAL | Status: AC | PRN

## 2011-05-07 NOTE — Progress Notes (Signed)
Patient ID: Leslie Hendricks, female   DOB: July 31, 1940, 71 y.o.   MRN: [id number] Subjective:  The patient is alert and pleasant. She is awaiting placement.  Objective: Vital signs in last 24 hours: Temp:  [98 F (36.7 C)-99.6 F (37.6 C)] 99 F (37.2 C) (01/22 0548) Pulse Rate:  [74-97] 87  (01/22 0548) Resp:  [18] 18  (01/22 0548) BP: (95-117)/(56-72) 117/72 mmHg (01/22 0548) SpO2:  [91 %-97 %] 97 % (01/22 0548)  Intake/Output from previous day: 01/21 0701 - 01/22 0700 In: 1000 [P.O.:1000] Out: 2750 [Urine:2750] Intake/Output this shift:    Physical exam the patient is alert and oriented. Her lower extremity strength is normal.  Lab Results:  [facility]ename 05/04/11 1415  WBC 10.5  HGB 11.1*  HCT 34.1*  PLT 206   BMET  Basename 05/04/11 1415  NA 135  K 3.4*  CL 96  CO2 31  GLUCOSE 144*  BUN 16  CREATININE 0.90  CALCIUM 8.6    Studies/Results: No results found.  Assessment/Plan: Postop day #5: We are awaiting placement.  LOS: 5 days     Vyron Fronczak D 05/07/2011, 7:34 AM

## 2011-05-07 NOTE — Discharge Summary (Signed)
Physician Discharge Summary  Patient ID: CARLYE PANAMENO MRN: [id number] DOB/AGE: 1941/01/15 71 y.o.  Admit date: 05/02/2011 Discharge date: 05/07/2011  Admission Diagnoses:Lumbar stenosis   Discharge Diagnoses:  Active Problems:  * No active hospital problems. *    Discharged Condition: good  Hospital Course: Lumbar fusion 05/02/11. Post op course unremarkable  Consults:none Significant Diagnostic Studies:none Treatments:L2/3 fusion Discharge Exam: Blood pressure 110/60, pulse 80, temperature 99 F (37.2 C), temperature source Oral, resp. rate 18, height 5\' 4"  (1.626 m), weight 102.099 kg (225 lb 1.4 oz), SpO2 97.00%. Normal strength, alert/oriented   Disposition: [facility]  Discharge Orders    Future Orders Please Complete By Expires   Diet - low sodium heart healthy      Increase activity slowly      Discharge instructions      Comments:   Call [phone] for a follow-up appointment.   No dressing needed      Call MD for:  temperature >100.4      Call MD for:  persistant nausea and vomiting      Call MD for:  severe uncontrolled pain      Call MD for:  redness, tenderness, or signs of infection (pain, swelling, redness, odor or green/yellow discharge around incision site)      Call MD for:  difficulty breathing, headache or visual disturbances      Call MD for:  hives      Call MD for:  persistant dizziness or light-headedness      Call MD for:  extreme fatigue        Medication List  As of 05/07/2011 12:19 PM   STOP taking these medications         HYDROcodone-acetaminophen 10-325 MG per tablet         TAKE these medications         aspirin 325 MG tablet   Take 325 mg by mouth daily.      atorvastatin 80 MG tablet   Commonly known as: LIPITOR   Take 80 mg by mouth daily.      cholecalciferol 400 UNITS Tabs   Commonly known as: VITAMIN D   Take 400 Units by mouth daily.      diazepam 5 MG tablet   Commonly known as: VALIUM   Take 0.5  tablets (2.5 mg total) by mouth every 6 (six) hours as needed.      fish oil-omega-3 fatty acids 1000 MG capsule   Take 1 g by mouth daily.      folic acid 400 MCG tablet   Commonly known as: FOLVITE   Take 400 mcg by mouth daily.      hydrochlorothiazide 25 MG tablet   Commonly known as: HYDRODIURIL   Take 25 mg by mouth daily.      HYDROcodone-acetaminophen 5-325 MG per tablet   Commonly known as: NORCO   Take 1-2 tablets by mouth every 4 (four) hours as needed.      metFORMIN 500 MG (MOD) 24 hr tablet   Commonly known as: GLUMETZA   Take 500 mg by mouth daily with breakfast.      moxifloxacin 0.5 % ophthalmic solution   Commonly known as: VIGAMOX   Place 1 drop into the left eye 3 (three) times daily. DAY OF AND 2 DAYS AFTER MONTHLY EYE INJECTION      multivitamin-lutein Caps   Take 1 capsule by mouth daily.      OVER THE COUNTER MEDICATION   Take  2 tablets by mouth daily. "EQUATE" STIMULANT LAXATIVE FOR CONSTIPATION      OVER THE COUNTER MEDICATION   Take 1 tablet by mouth daily. MAGNESIUM 250MG       telmisartan-hydrochlorothiazide 80-12.5 MG per tablet   Commonly known as: MICARDIS HCT   Take 1 tablet by mouth daily.      venlafaxine 75 MG 24 hr capsule   Commonly known as: EFFEXOR-XR   Take 75 mg by mouth daily.      vitamin C with rose hips 500 MG tablet   Take 500 mg by mouth daily.      vitamin E 400 UNIT capsule   Take 400 Units by mouth daily.      zinc gluconate 50 MG tablet   Take 50 mg by mouth daily.             SignedCristi Loron 05/07/2011, 12:19 PM

## 2011-05-07 NOTE — Progress Notes (Signed)
Physical Therapy Treatment Patient Details Name: Leslie Hendricks MRN: [id number] DOB: December 06, 1940 Today's Date: 05/07/2011  PT Assessment/Plan   PT Goals     PT Treatment  Nursing did not allow ambulation due to low BP. Pt assisted back to bed per nursing request. Pt symptomatic in standing with lightheadedness. Plan is for pt. To dc to SNF. Precautions/Restrictions  Precautions Precautions: Fall;Back Precaution Comments: continued education on back precautions (pt unable to recall back precautions) Required Braces or Orthoses: Yes Spinal Brace: Lumbar corset;Applied in sitting position Restrictions Weight Bearing Restrictions: No Mobility (including Balance) Bed Mobility Bed Mobility: Yes Sit to Sidelying Right: 3: Mod assist Sit to Sidelying Right Details (indicate cue type and reason): vc for log roll and pt. was unable to stay in sidelying for transition prior to supine. Transfers Transfers: Yes Sit to Stand: 3: Mod assist Sit to Stand Details (indicate cue type and reason): vc for foot placement to ease sit to stand. Stand to Sit: 4: Min assist Stand to Sit Details: vc for hand placement Stand Pivot Transfers: 4: Min assist Stand Pivot Transfer Details (indicate cue type and reason): vc for walker placement with backing. Ambulation/Gait Ambulation/Gait: No (nurse did not allow due to low BP) Assistive device: Rolling walker Stairs: No    Exercise    End of Session PT - End of Session Equipment Utilized During Treatment: Gait belt;Back brace Activity Tolerance: Treatment limited secondary to medical complications (Comment) (low BP) Patient left: in bed;with call bell in reach General Behavior During Session: [facility] for tasks performed Cognition: [facility] for tasks performed  Julian Reil 05/07/2011, 12:31 PM

## 2011-05-07 NOTE — Progress Notes (Signed)
Pt. D/c instructions provided. Pt verbalized understanding. Pt iv d/c intact. Pt under no s/s distress.

## 2011-05-07 NOTE — Progress Notes (Signed)
Pt is ready for discharge today to [facility]. Facility has received discharge paperwork. Pt and family are agreeable to discharge to SNF. [facility] is providing transportation. CSW signing off as no further social work needs identified.   Rosielee Corporan, MSW, LCSWA #[phone] 

## 2011-05-07 NOTE — Progress Notes (Signed)
Pt. From bed to chair by PT. Pt. BP dropped. Pt. Placed back in bed. bp taken manually, BP increased to norm for pt..  BP did complain of dizziness and Headache. Pain med received. MD made aware. Will continue to monitor.  Currently stable.

## 2011-05-09 MED FILL — Sodium Chloride Irrigation Soln 0.9%: Qty: 3000 | Status: AC

## 2011-05-09 MED FILL — Heparin Sodium (Porcine) Inj 1000 Unit/ML: INTRAMUSCULAR | Qty: 30 | Status: AC

## 2011-05-09 MED FILL — Sodium Chloride IV Soln 0.9%: INTRAVENOUS | Qty: 1000 | Status: AC

## 2011-10-17 ENCOUNTER — Emergency Department (HOSPITAL_COMMUNITY): Payer: Medicare Other

## 2011-10-17 ENCOUNTER — Emergency Department (HOSPITAL_COMMUNITY)
Admission: EM | Admit: 2011-10-17 | Discharge: 2011-10-17 | Disposition: A | Discharge status: Home / Self Care | Payer: Medicare Other | Attending: Emergency Medicine | Admitting: Emergency Medicine

## 2011-10-17 ENCOUNTER — Encounter (HOSPITAL_COMMUNITY): Payer: Self-pay

## 2011-10-17 DIAGNOSIS — H3530 Unspecified macular degeneration: Secondary | ICD-10-CM | POA: Insufficient documentation

## 2011-10-17 DIAGNOSIS — S139XXA Sprain of joints and ligaments of unspecified parts of neck, initial encounter: Secondary | ICD-10-CM | POA: Insufficient documentation

## 2011-10-17 DIAGNOSIS — Z9181 History of falling: Secondary | ICD-10-CM | POA: Insufficient documentation

## 2011-10-17 DIAGNOSIS — R51 Headache: Secondary | ICD-10-CM | POA: Insufficient documentation

## 2011-10-17 DIAGNOSIS — W010XXA Fall on same level from slipping, tripping and stumbling without subsequent striking against object, initial encounter: Secondary | ICD-10-CM | POA: Insufficient documentation

## 2011-10-17 DIAGNOSIS — F3289 Other specified depressive episodes: Secondary | ICD-10-CM | POA: Insufficient documentation

## 2011-10-17 DIAGNOSIS — I10 Essential (primary) hypertension: Secondary | ICD-10-CM | POA: Insufficient documentation

## 2011-10-17 DIAGNOSIS — M549 Dorsalgia, unspecified: Secondary | ICD-10-CM | POA: Insufficient documentation

## 2011-10-17 DIAGNOSIS — Z79899 Other long term (current) drug therapy: Secondary | ICD-10-CM | POA: Insufficient documentation

## 2011-10-17 DIAGNOSIS — F329 Major depressive disorder, single episode, unspecified: Secondary | ICD-10-CM | POA: Insufficient documentation

## 2011-10-17 DIAGNOSIS — E119 Type 2 diabetes mellitus without complications: Secondary | ICD-10-CM | POA: Insufficient documentation

## 2011-10-17 DIAGNOSIS — S161XXA Strain of muscle, fascia and tendon at neck level, initial encounter: Secondary | ICD-10-CM

## 2011-10-17 DIAGNOSIS — M542 Cervicalgia: Secondary | ICD-10-CM | POA: Insufficient documentation

## 2011-10-17 DIAGNOSIS — Z87891 Personal history of nicotine dependence: Secondary | ICD-10-CM | POA: Insufficient documentation

## 2011-10-17 DIAGNOSIS — W19XXXA Unspecified fall, initial encounter: Secondary | ICD-10-CM

## 2011-10-17 DIAGNOSIS — S3421XA Injury of nerve root of lumbar spine, initial encounter: Secondary | ICD-10-CM | POA: Insufficient documentation

## 2011-10-17 MED ORDER — HYDROCODONE-ACETAMINOPHEN 5-325 MG PO TABS
1.0000 | ORAL_TABLET | Freq: Once | ORAL | Status: AC
  Administered 2011-10-17: 1 via ORAL
  Filled 2011-10-17: qty 1

## 2011-10-17 NOTE — ED Notes (Signed)
Pt waiting for ride home. wc to lobby. Meal given.

## 2011-10-17 NOTE — ED Provider Notes (Cosign Needed)
History     CSN: [id number]  Arrival date & time 10/17/11  1708   First MD Initiated Contact with Patient 10/17/11 1714      Chief Complaint  Patient presents with  . Fall  . Neck Pain  . Back Pain    (Consider location/radiation/quality/duration/timing/severity/associated sxs/prior treatment) Patient is a 70 y.o. female presenting with fall, neck pain, and back pain. The history is provided by the patient (the pt states she slipped and fell. no loc.  pt with neck and back pain). No language interpreter was used.  Fall The accident occurred less than 1 hour ago. The fall occurred while walking. She fell from a height of 1 to 2 ft. She landed on a hard floor. Point of impact: back. Pain location: back. The pain is at a severity of 3/10. The pain is moderate. She was not ambulatory at the scene. There was no entrapment after the fall. There was no drug use involved in the accident. There was no alcohol use involved in the accident. Pertinent negatives include no fever, no abdominal pain, no hematuria and no headaches.  Neck Pain  Pertinent negatives include no chest pain and no headaches.  Back Pain  Pertinent negatives include no chest pain, no fever, no headaches and no abdominal pain.    Past Medical History  Diagnosis Date  . Diabetes mellitus   . Hypertension   . Cancer   . Uterine cancer dx'd 1991    surg only  . Arthritis   . Depression   . Back pain, chronic   . 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 Falls frequently     neurological workup inconclusive per pt    Past Surgical History  Procedure Date  . Foot surgery     left toe  . Joint replacement   . Neck surgery 2009, 2010    x 2  . Nose surgery   . Back surgery 2002, 2003, 2006    x 3  . Abdominal hysterectomy 1991  . Hemorrhoid surgery   . Eye surgery     R eye     History reviewed. No pertinent family history.  History  Substance Use Topics  . Smoking status: Former Games developer   . Smokeless tobacco: Not on file  . Alcohol Use: No    OB History    Grav Para Term Preterm Abortions TAB SAB Ect Mult Living                  Review of Systems  Constitutional: Negative for fever and fatigue.  HENT: Positive for neck pain. Negative for congestion, sinus pressure and ear discharge.   Eyes: Negative for discharge.  Respiratory: Negative for cough.   Cardiovascular: Negative for chest pain.  Gastrointestinal: Negative for abdominal pain and diarrhea.  Genitourinary: Negative for frequency and hematuria.  Musculoskeletal: Positive for back pain.  Skin: Negative for rash.  Neurological: Negative for seizures and headaches.  Hematological: Negative.   Psychiatric/Behavioral: Negative for hallucinations.    Allergies  Banana; Codeine; Dilaudid; and Oxycodone hcl er  Home Medications   Current Outpatient Rx  Name Route Sig Dispense Refill  . ALENDRONATE SODIUM 70 MG PO TABS Oral Take 70 mg by mouth every 7 (seven) days. Take on Monday. Take with a full glass of water on an empty stomach.    Marland Kitchen VITAMIN C-ACEROLA 500 MG PO TABS Oral Take 500 mg by mouth daily.      Marland Kitchen  ASPIRIN 325 MG PO TABS Oral Take 325 mg by mouth daily.      . ATORVASTATIN CALCIUM 80 MG PO TABS Oral Take 80 mg by mouth daily.      . AZITHROMYCIN 1 % OP SOLN Left Eye Place 1 drop into the left eye 2 (two) times daily. Take day of and two days following eye injection    . CALTRATE 600+D PO Oral Take 1 tablet by mouth daily.    . CHOLECALCIFEROL 400 UNITS PO TABS Oral Take 400 Units by mouth daily.      Marland Kitchen DIAZEPAM 5 MG PO TABS Oral Take 5 mg by mouth every 6 (six) hours as needed. For anxiety    . OMEGA-3 FATTY ACIDS 1000 MG PO CAPS Oral Take 1 g by mouth daily.      Marland Kitchen FOLIC ACID 400 MCG PO TABS Oral Take 400 mcg by mouth daily.      . FUROSEMIDE 20 MG PO TABS Oral Take 20 mg by mouth daily.    Marland Kitchen HYDROCODONE-ACETAMINOPHEN 10-325 MG PO TABS Oral Take 1 tablet by mouth every 6 (six) hours as needed.  For pain    . METFORMIN HCL ER (MOD) 500 MG PO TB24 Oral Take 500 mg by mouth daily with breakfast.      . OCUVITE-LUTEIN PO CAPS Oral Take 1 capsule by mouth daily.      Marland Kitchen OVER THE COUNTER MEDICATION Oral Take 1 tablet by mouth daily. MAGNESIUM 250MG     . SYSTANE OP Both Eyes Place 1 drop into both eyes 2 (two) times daily.    Marland Kitchen POLYETHYLENE GLYCOL 3350 PO PACK Oral Take 17 g by mouth daily.    . TELMISARTAN-HCTZ 80-12.5 MG PO TABS Oral Take 1 tablet by mouth daily.      . VENLAFAXINE HCL ER 75 MG PO CP24 Oral Take 75 mg by mouth daily.     Marland Kitchen VITAMIN E 400 UNITS PO CAPS Oral Take 400 Units by mouth daily.      Marland Kitchen ZINC GLUCONATE 50 MG PO TABS Oral Take 50 mg by mouth daily.        BP 125/46  Pulse 74  Temp 97.7 F (36.5 C) (Oral)  Resp 18  SpO2 97%  Physical Exam  Constitutional: She is oriented to person, place, and time. She appears well-developed.  HENT:  Head: Normocephalic and atraumatic.  Eyes: Conjunctivae and EOM are normal. No scleral icterus.  Neck: Neck supple. No thyromegaly present.  Cardiovascular: Normal rate and regular rhythm.  Exam reveals no gallop and no friction rub.   No murmur heard. Pulmonary/Chest: No stridor. She has no wheezes. She has no rales. She exhibits no tenderness.  Abdominal: She exhibits no distension. There is no tenderness. There is no rebound.  Musculoskeletal: Normal range of motion. She exhibits no edema.       Tender post. Neck and lumbar spine.  Neuro exam norma.  Lymphadenopathy:    She has no cervical adenopathy.  Neurological: She is oriented to person, place, and time. Coordination normal.  Skin: No rash noted. No erythema.  Psychiatric: She has a normal mood and affect. Her behavior is normal.    ED Course  Procedures (including critical care time)  Labs Reviewed - No data to display Dg Lumbar Spine Complete  10/17/2011  *RADIOLOGY REPORT*  Clinical Data: Fall, back pain  LUMBAR SPINE - COMPLETE 4+ VIEW  Comparison: 09/03/2011   Findings: Five views of the lumbar spine submitted.  Stable  postsurgical changes with metallic fixation rods and screws from L2- S1.  No acute fracture or subluxation.  IMPRESSION: No acute fracture or subluxation.  Stable postsurgical changes from L2-S1.  Original Report Authenticated By: Natasha Mead, M.D.   Ct Head Wo Contrast  10/17/2011  *RADIOLOGY REPORT*  Clinical Data:  Fall.  Right frontal headache and right-sided posterior neck pain.  CT HEAD WITHOUT CONTRAST CT CERVICAL SPINE WITHOUT CONTRAST  Technique:  Multidetector CT imaging of the head and cervical spine was performed following the standard protocol without intravenous contrast.  Multiplanar CT image reconstructions of the cervical spine were also generated.  Comparison:  CT head and cervical spine 04/14/2011.  CT HEAD  Findings: No acute intracranial abnormality is present. Specifically, there is no evidence for acute infarct, hemorrhage, mass, hydrocephalus, or extra-axial fluid collection.  The paranasal sinuses and mastoid air cells are clear.  The globes and orbits are intact.  The osseous skull is intact.  IMPRESSION: Negative CT of the head.  CT CERVICAL SPINE  Findings: The patient is fused anteriorly at C4-5 and C5-6. Anterior plate screw fixation remains at C5-6.  There is mature bridging bone and both levels.  Alignment is maintained.  No acute fracture or subluxation is evident.  Degenerative changes are noted at C1-2.  The patient is edentulous.  Vascular calcifications are noted at the carotid bifurcations bilaterally, worse right than left.  The lung apices are clear. The soft tissues of the neck are otherwise unremarkable.  IMPRESSION:  1.  No acute fracture or traumatic subluxation. 2.  Stable postoperative changes of the cervical spine.  Original Report Authenticated By: Jamesetta Orleans. MATTERN, M.D.   Ct Cervical Spine Wo Contrast  10/17/2011  *RADIOLOGY REPORT*  Clinical Data:  Fall.  Right frontal headache and right-sided  posterior neck pain.  CT HEAD WITHOUT CONTRAST CT CERVICAL SPINE WITHOUT CONTRAST  Technique:  Multidetector CT imaging of the head and cervical spine was performed following the standard protocol without intravenous contrast.  Multiplanar CT image reconstructions of the cervical spine were also generated.  Comparison:  CT head and cervical spine 04/14/2011.  CT HEAD  Findings: No acute intracranial abnormality is present. Specifically, there is no evidence for acute infarct, hemorrhage, mass, hydrocephalus, or extra-axial fluid collection.  The paranasal sinuses and mastoid air cells are clear.  The globes and orbits are intact.  The osseous skull is intact.  IMPRESSION: Negative CT of the head.  CT CERVICAL SPINE  Findings: The patient is fused anteriorly at C4-5 and C5-6. Anterior plate screw fixation remains at C5-6.  There is mature bridging bone and both levels.  Alignment is maintained.  No acute fracture or subluxation is evident.  Degenerative changes are noted at C1-2.  The patient is edentulous.  Vascular calcifications are noted at the carotid bifurcations bilaterally, worse right than left.  The lung apices are clear. The soft tissues of the neck are otherwise unremarkable.  IMPRESSION:  1.  No acute fracture or traumatic subluxation. 2.  Stable postoperative changes of the cervical spine.  Original Report Authenticated By: Jamesetta Orleans. MATTERN, M.D.     1. Fall   2. Cervical strain   3. Contusion Of Lumbar Nerve Root       MDM          Benny Lennert, MD 10/17/11 1907

## 2011-10-17 NOTE — ED Notes (Signed)
Pt waiting for ride home.

## 2011-10-17 NOTE — ED Notes (Signed)
Return from xray

## 2011-10-17 NOTE — ED Notes (Signed)
Pt states tripped and fell backwards from standing position. Denies loc c/o pain in laterial neck and lower back history of chronic pain in same rigions with an increase in severity. MAE randomly. Alertx4

## 2011-10-17 NOTE — ED Notes (Signed)
Patient transported to X-ray 

## 2011-11-18 DIAGNOSIS — G2581 Restless legs syndrome: Secondary | ICD-10-CM | POA: Insufficient documentation

## 2012-02-24 ENCOUNTER — Encounter (HOSPITAL_COMMUNITY)
Admission: RE | Disposition: A | Discharge status: Home / Self Care | Payer: Self-pay | Source: Ambulatory Visit | Attending: Otolaryngology

## 2012-02-24 ENCOUNTER — Ambulatory Visit (HOSPITAL_COMMUNITY)
Admission: RE | Admit: 2012-02-24 | Discharge: 2012-02-24 | Disposition: A | Discharge status: Home / Self Care | Payer: Medicare Other | Source: Ambulatory Visit | Attending: Otolaryngology | Admitting: Otolaryngology

## 2012-02-24 DIAGNOSIS — R490 Dysphonia: Secondary | ICD-10-CM | POA: Insufficient documentation

## 2012-02-24 HISTORY — PX: ESOPHAGEAL MANOMETRY: SHX5429

## 2012-02-24 HISTORY — PX: 24 HOUR PH STUDY: SHX5419

## 2012-02-24 SURGERY — MANOMETRY, ESOPHAGUS

## 2012-02-24 MED ORDER — LIDOCAINE VISCOUS 2 % MT SOLN
OROMUCOSAL | Status: AC
  Filled 2012-02-24: qty 15

## 2012-02-25 ENCOUNTER — Encounter (HOSPITAL_COMMUNITY): Payer: Self-pay

## 2012-02-25 ENCOUNTER — Encounter (HOSPITAL_COMMUNITY): Payer: Self-pay | Admitting: Otolaryngology

## 2012-03-06 ENCOUNTER — Other Ambulatory Visit: Payer: Self-pay | Admitting: Neurosurgery

## 2012-03-06 DIAGNOSIS — M542 Cervicalgia: Secondary | ICD-10-CM

## 2012-03-11 ENCOUNTER — Ambulatory Visit
Admission: RE | Admit: 2012-03-11 | Discharge: 2012-03-11 | Disposition: A | Discharge status: Home / Self Care | Payer: Medicare Other | Source: Ambulatory Visit | Attending: Neurosurgery | Admitting: Neurosurgery

## 2012-03-11 DIAGNOSIS — M542 Cervicalgia: Secondary | ICD-10-CM

## 2012-03-16 ENCOUNTER — Ambulatory Visit (HOSPITAL_COMMUNITY)
Admission: RE | Admit: 2012-03-16 | Discharge: 2012-03-16 | Disposition: A | Discharge status: Home / Self Care | Payer: Medicare Other | Source: Ambulatory Visit | Attending: Otolaryngology | Admitting: Otolaryngology

## 2012-03-16 ENCOUNTER — Encounter (HOSPITAL_COMMUNITY)
Admission: RE | Disposition: A | Discharge status: Home / Self Care | Payer: Self-pay | Source: Ambulatory Visit | Attending: Otolaryngology

## 2012-03-16 DIAGNOSIS — R490 Dysphonia: Secondary | ICD-10-CM | POA: Insufficient documentation

## 2012-03-16 HISTORY — PX: 24 HOUR PH STUDY: SHX5419

## 2012-03-16 SURGERY — MONITORING, ESOPHAGEAL PH, 24 HOUR

## 2012-03-16 MED ORDER — LIDOCAINE VISCOUS 2 % MT SOLN
OROMUCOSAL | Status: AC
  Filled 2012-03-16: qty 15

## 2012-03-17 ENCOUNTER — Encounter (HOSPITAL_COMMUNITY): Payer: Self-pay | Admitting: Otolaryngology

## 2012-04-28 ENCOUNTER — Other Ambulatory Visit: Payer: Self-pay | Admitting: Gastroenterology

## 2012-04-29 ENCOUNTER — Encounter (HOSPITAL_COMMUNITY): Payer: Self-pay | Admitting: Pharmacy Technician

## 2012-05-06 ENCOUNTER — Other Ambulatory Visit: Payer: Self-pay | Admitting: Gastroenterology

## 2012-05-08 ENCOUNTER — Encounter (HOSPITAL_COMMUNITY)
Admission: RE | Disposition: A | Discharge status: Home / Self Care | Payer: Self-pay | Source: Ambulatory Visit | Attending: Gastroenterology

## 2012-05-08 ENCOUNTER — Ambulatory Visit (HOSPITAL_COMMUNITY)
Admission: RE | Admit: 2012-05-08 | Discharge: 2012-05-08 | Disposition: A | Discharge status: Home / Self Care | Payer: Medicare Other | Source: Ambulatory Visit | Attending: Gastroenterology | Admitting: Gastroenterology

## 2012-05-08 ENCOUNTER — Encounter (HOSPITAL_COMMUNITY): Payer: Self-pay | Admitting: *Deleted

## 2012-05-08 DIAGNOSIS — I10 Essential (primary) hypertension: Secondary | ICD-10-CM | POA: Insufficient documentation

## 2012-05-08 DIAGNOSIS — K224 Dyskinesia of esophagus: Secondary | ICD-10-CM | POA: Insufficient documentation

## 2012-05-08 DIAGNOSIS — E119 Type 2 diabetes mellitus without complications: Secondary | ICD-10-CM | POA: Insufficient documentation

## 2012-05-08 HISTORY — PX: ESOPHAGOGASTRODUODENOSCOPY: SHX5428

## 2012-05-08 HISTORY — PX: BOTOX INJECTION: SHX5754

## 2012-05-08 HISTORY — DX: Peripheral vascular disease, unspecified: I73.9

## 2012-05-08 LAB — GLUCOSE, CAPILLARY: Glucose-Capillary: 91 mg/dL (ref 70–99)

## 2012-05-08 SURGERY — EGD (ESOPHAGOGASTRODUODENOSCOPY)
Anesthesia: Moderate Sedation

## 2012-05-08 MED ORDER — SODIUM CHLORIDE 0.9 % IV SOLN
INTRAVENOUS | Status: DC
  Administered 2012-05-08: 500 mL via INTRAVENOUS

## 2012-05-08 MED ORDER — FENTANYL CITRATE 0.05 MG/ML IJ SOLN
INTRAMUSCULAR | Status: DC | PRN
  Administered 2012-05-08 (×3): 25 ug via INTRAVENOUS

## 2012-05-08 MED ORDER — MIDAZOLAM HCL 10 MG/2ML IJ SOLN
INTRAMUSCULAR | Status: AC
  Filled 2012-05-08: qty 2

## 2012-05-08 MED ORDER — FENTANYL CITRATE 0.05 MG/ML IJ SOLN
INTRAMUSCULAR | Status: AC
  Filled 2012-05-08: qty 2

## 2012-05-08 MED ORDER — ONABOTULINUMTOXINA 100 UNITS IJ SOLR
100.0000 [IU] | Freq: Once | INTRAMUSCULAR | Status: DC
  Filled 2012-05-08: qty 100

## 2012-05-08 MED ORDER — MIDAZOLAM HCL 10 MG/2ML IJ SOLN
INTRAMUSCULAR | Status: DC | PRN
  Administered 2012-05-08: 2 mg via INTRAVENOUS
  Administered 2012-05-08: 1 mg via INTRAVENOUS
  Administered 2012-05-08 (×2): 2 mg via INTRAVENOUS

## 2012-05-08 NOTE — H&P (Signed)
Reason for Consult: Nutcracker esophagus Referring Physician: Christia Reading, M.D.  Grayland Ormond HPI: This is a 72 year old female with complaints of dysphagia.  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  The patient was referred for further treatment.  Past Medical History  Diagnosis Date  . Diabetes mellitus   . Hypertension   . Cancer   . Uterine cancer dx'd 1991    surg only  . Arthritis   . Depression   . Back pain, chronic   . 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 Falls frequently     neurological workup inconclusive per pt    Past Surgical History  Procedure Date  . Foot surgery     left toe  . Joint replacement   . Neck surgery 2009, 2010    x 2  . Nose surgery   . Back surgery 2002, 2003, 2006    x 3  . Abdominal hysterectomy 1991  . Hemorrhoid surgery   . Eye surgery     R eye   . Esophageal manometry 02/24/2012    Procedure: ESOPHAGEAL MANOMETRY (EM);  Surgeon: Christia Reading, MD;  Location: [facility];  Service: Endoscopy;  Laterality: N/A;  without impedience  . 24 hour ph study 02/24/2012    Procedure: 24 HOUR PH STUDY;  Surgeon: Christia Reading, MD;  Location: [facility];  Service: Endoscopy;  Laterality: N/A;  . 24 hour ph study 03/16/2012    Procedure: 24 HOUR PH STUDY;  Surgeon: Christia Reading, MD;  Location: [facility];  Service: Endoscopy;  Laterality: N/A;  Debbora Presto will credit the patient for Test on 11/11 and rebill for this date 03/16/12 Everrett Coombe AD     No family history on file.  Social History:  reports that she has quit smoking. She does not have any smokeless tobacco history on file. She reports that she does not drink alcohol or use illicit drugs.  Allergies:  Allergies  Allergen Reactions  . Banana Nausea And Vomiting  . Codeine Nausea And Vomiting  . Dilaudid (Hydromorphone) Other (See Comments)    Altered mental status  . Oxycodone Hcl Er Nausea And Vomiting  . Sausage  (Pickled Meat) Nausea And Vomiting    Medications: Scheduled:   Continuous:    No results found for this or any previous visit (from the past 24 hour(s)).   No results found.  ROS:  As stated above in the HPI otherwise negative.  There were no vitals taken for this visit.    PE: Gen: NAD, Alert and Oriented HEENT:  [place]/AT, EOMI Neck: Supple, no LAD Lungs: CTA Bilaterally CV: RRR without M/G/R ABM: Soft, NTND, +BS Ext: No C/C/E  Assessment/Plan: 1) Nutcracker esophagus.  Plan: 1) EGD with Botox in the distal esophagus.  Hopefully this can help to benefit her.  Meah Jiron D 05/08/2012, 9:11 AM

## 2012-05-08 NOTE — Op Note (Signed)
[facility] [address] [place], [address]   OPERATIVE PROCEDURE REPORT  PATIENT: Leslie, Hendricks  MR#: [id number] BIRTHDATE: 12-03-1940  GENDER: Female ENDOSCOPIST: Jeani Hawking, MD ASSISTANT:   Darral Dash, RN and Blair Dolphin, technician PROCEDURE DATE: 05/08/2012 PROCEDURE:   EGDwith Botox injection. ASA CLASS:   Class III INDICATIONS:Nutcracker esophagus. MEDICATIONS: Versed 7 mg IV and Fentanyl 75 mcg IV TOPICAL ANESTHETIC:   none  DESCRIPTION OF PROCEDURE:   After the risks benefits and alternatives of the procedure were thoroughly explained, informed consent was obtained.  The EG-[id number] ([id number])  endoscope was introduced through the mouth  and advanced to the second portion of the duodenum Without limitations.      The instrument was slowly withdrawn as the mucosa was fully examined.    FINDINGS: The upper, middle and distal third of the esophagus were carefully inspected and no abnormalities were noted.  The z-line was well seen at the GEJ.  At approximately 1-1.5 cm above the Z-line Botox was injected in a four quadrant fashion.  25 units of botox was injected in each quadrant.  The endoscope was pushed into the fundus which was normal including a retroflexed view.  The antrum, gastric body, first and second part of the duodenum were unremarkable.   Retroflexed views revealed no abnormalities. The scope was then withdrawn from the patient and the procedure terminated.  COMPLICATIONS: There were no complications.  IMPRESSION: 1) Normal EGD  RECOMMENDATIONS: 1) Follow up in the office in one month.   _______________________________ eSigned:  Jeani Hawking, MD 05/08/2012 11:06 AM

## 2012-05-11 ENCOUNTER — Encounter (HOSPITAL_COMMUNITY): Payer: Self-pay | Admitting: Gastroenterology

## 2012-09-01 ENCOUNTER — Other Ambulatory Visit (HOSPITAL_COMMUNITY): Payer: Self-pay | Admitting: Family Medicine

## 2012-09-01 DIAGNOSIS — K760 Fatty (change of) liver, not elsewhere classified: Secondary | ICD-10-CM

## 2012-09-14 ENCOUNTER — Encounter (HOSPITAL_COMMUNITY)
Admission: RE | Admit: 2012-09-14 | Discharge: 2012-09-14 | Disposition: A | Discharge status: Home / Self Care | Payer: Medicare Other | Source: Ambulatory Visit | Attending: Family Medicine | Admitting: Family Medicine

## 2012-09-14 DIAGNOSIS — K7689 Other specified diseases of liver: Secondary | ICD-10-CM | POA: Insufficient documentation

## 2012-09-14 DIAGNOSIS — K219 Gastro-esophageal reflux disease without esophagitis: Secondary | ICD-10-CM | POA: Insufficient documentation

## 2012-09-14 DIAGNOSIS — K760 Fatty (change of) liver, not elsewhere classified: Secondary | ICD-10-CM

## 2012-09-14 DIAGNOSIS — R109 Unspecified abdominal pain: Secondary | ICD-10-CM | POA: Insufficient documentation

## 2012-09-14 MED ORDER — SINCALIDE 5 MCG IJ SOLR
0.0200 ug/kg | Freq: Once | INTRAMUSCULAR | Status: AC
  Administered 2012-09-14: 2.01 ug via INTRAVENOUS

## 2012-09-14 MED ORDER — SINCALIDE 5 MCG IJ SOLR
INTRAMUSCULAR | Status: AC
  Filled 2012-09-14: qty 5

## 2012-09-14 MED ORDER — TECHNETIUM TC 99M MEBROFENIN IV KIT
5.0000 | PACK | Freq: Once | INTRAVENOUS | Status: AC | PRN
  Administered 2012-09-14: 5 via INTRAVENOUS

## 2012-09-14 MED ORDER — SINCALIDE 5 MCG IJ SOLR
INTRAMUSCULAR | Status: AC
  Administered 2012-09-14: 2.01 ug via INTRAVENOUS
  Filled 2012-09-14: qty 5

## 2012-10-05 DIAGNOSIS — R1310 Dysphagia, unspecified: Secondary | ICD-10-CM | POA: Insufficient documentation

## 2012-10-05 DIAGNOSIS — K219 Gastro-esophageal reflux disease without esophagitis: Secondary | ICD-10-CM | POA: Insufficient documentation

## 2012-10-09 ENCOUNTER — Encounter: Payer: Self-pay | Admitting: Cardiovascular Disease

## 2012-10-09 ENCOUNTER — Ambulatory Visit (INDEPENDENT_AMBULATORY_CARE_PROVIDER_SITE_OTHER): Payer: Medicare Other | Admitting: Cardiovascular Disease

## 2012-10-09 VITALS — BP 106/62 | HR 82 | Ht 64.0 in | Wt 226.7 lb

## 2012-10-09 DIAGNOSIS — I10 Essential (primary) hypertension: Secondary | ICD-10-CM

## 2012-10-09 DIAGNOSIS — E119 Type 2 diabetes mellitus without complications: Secondary | ICD-10-CM

## 2012-10-09 DIAGNOSIS — E785 Hyperlipidemia, unspecified: Secondary | ICD-10-CM

## 2012-10-09 NOTE — Assessment & Plan Note (Signed)
Under good control on current medications 

## 2012-10-09 NOTE — Patient Instructions (Addendum)
Your physician wants you to follow-up in: 1 year with an extender. You will receive a reminder letter in the mail two months in advance. If you don't receive a letter, please call our office to schedule the follow-up appointment.  

## 2012-10-09 NOTE — Assessment & Plan Note (Signed)
On statin drug followed by Dr. Collins Scotland

## 2012-10-09 NOTE — Progress Notes (Signed)
10/09/2012 Leslie Hendricks   [birth year]  [id number]  Primary Physician Herb Grays, MD Primary Cardiologist: Runell Gess MD Roseanne Reno  HPI:  The patient is a 72 year old, mildly overweight, widowed Caucasian female with no children who lives alone. Her husband of 51 years died on 03-30-09. She has retired from the [employer]. I saw her a little over a year ago. Her risk factors include 45 pack-year history of tobacco abuse, having quit 12 years ago; treated hypertension; diabetes; and hyperlipidemia. She does have a strong family history of heart disease. Both parents had bypass surgery. Three of her siblings have myocardial infractions. She had a negative pharmacologic stress test on July 03, 2010. She denies chest pain or shortness of breath. Her major complaints are of lack of stability on her legs, but she really denies claudication. She walks with the aid of a walker. She had arterial Dopplers performed in our office in March of 2012 revealing ABI of 0.98 on the right and 0.77 on the left. She has a very limited functional existence from bed to chair. She rarely gets out of the house. Since I saw her back one year ago she's had no cardiovascular complaints.      Current Outpatient Prescriptions  Medication Sig Dispense Refill  . Alum Hydroxide-Mag Carbonate (GAVISCON EXTRA STRENGTH PO) Take 1 tablet by mouth daily. 2-4 tablets daily      . Ascorbic Acid (VITAMIN C WITH ROSE HIPS) 500 MG tablet Take 500 mg by mouth every morning.       Marland Kitchen aspirin 325 MG EC tablet Take 325 mg by mouth every morning.      Marland Kitchen atorvastatin (LIPITOR) 80 MG tablet Take 80 mg by mouth at bedtime.       . Calcium Carbonate-Vitamin D (CALTRATE 600+D PO) Take 1 tablet by mouth every morning.       . cholecalciferol (VITAMIN D) 400 UNITS TABS Take 400 Units by mouth every morning.       . diazepam (VALIUM) 5 MG tablet Take 5 mg by mouth at bedtime.       Marland Kitchen esomeprazole (NEXIUM) 40 MG  capsule Take 40 mg by mouth daily before breakfast.      . fish oil-omega-3 fatty acids 1000 MG capsule Take 1 g by mouth every morning.       . folic acid (FOLVITE) 400 MCG tablet Take 400 mcg by mouth every morning.       . furosemide (LASIX) 20 MG tablet Take 20 mg by mouth every morning.       Marland Kitchen HYDROcodone-acetaminophen (NORCO) 10-325 MG per tablet Take 1 tablet by mouth every 6 (six) hours as needed. For pain      . metFORMIN (GLUMETZA) 500 MG (MOD) 24 hr tablet Take 500 mg by mouth daily with breakfast.       . multivitamin-lutein (OCUVITE-LUTEIN) CAPS Take 1 capsule by mouth at bedtime.       Marland Kitchen OVER THE COUNTER MEDICATION Take 250 mg by mouth every morning. Magnesium 250mg .      Bertram Gala Glycol-Propyl Glycol (SYSTANE OP) Place 1 drop into both eyes 2 (two) times daily.      . polyethylene glycol (MIRALAX / GLYCOLAX) packet Take 17 g by mouth at bedtime.       . Ranibizumab (LUCENTIS IO) Inject 1 each into the eye every 5 (five) weeks.      Marland Kitchen telmisartan-hydrochlorothiazide (MICARDIS HCT) 80-12.5 MG per tablet Take 1 tablet by  mouth every morning.       . Thiamine HCl (VITAMIN B-1) 250 MG tablet Take 250 mg by mouth every morning.      . venlafaxine (EFFEXOR-XR) 75 MG 24 hr capsule Take 75 mg by mouth every morning.       . vitamin E 400 UNIT capsule Take 400 Units by mouth every morning.       . zinc gluconate 50 MG tablet Take 50 mg by mouth every morning.        No current facility-administered medications for this visit.    Allergies  Allergen Reactions  . Banana Nausea And Vomiting  . Codeine Nausea And Vomiting  . Dilaudid (Hydromorphone) Other (See Comments)    Altered mental status  . Oxycodone Hcl Er Nausea And Vomiting  . Sausage (Pickled Meat) Nausea And Vomiting    History   Social History  . Marital Status: Widowed    Spouse Name: N/A    Number of Children: N/A  . Years of Education: N/A   Occupational History  . Not on file.   Social History Main  Topics  . Smoking status: Former Games developer  . Smokeless tobacco: Not on file  . Alcohol Use: No  . Drug Use: No  . Sexually Active: No   Other Topics Concern  . Not on file   Social History Narrative  . No narrative on file     Review of Systems: General: negative for chills, fever, night sweats or weight changes.  Cardiovascular: negative for chest pain, dyspnea on exertion, edema, orthopnea, palpitations, paroxysmal nocturnal dyspnea or shortness of breath Dermatological: negative for rash Respiratory: negative for cough or wheezing Urologic: negative for hematuria Abdominal: negative for nausea, vomiting, diarrhea, bright red blood per rectum, melena, or hematemesis Neurologic: negative for visual changes, syncope, or dizziness All other systems reviewed and are otherwise negative except as noted above.    Blood pressure 106/62, pulse 82, height 5\' 4"  (1.626 m), weight 226 lb 11.2 oz (102.83 kg).  General appearance: alert and no distress Neck: no adenopathy, no carotid bruit, no JVD, supple, symmetrical, trachea midline and thyroid not enlarged, symmetric, no tenderness/mass/nodules Lungs: clear to auscultation bilaterally Heart: regular rate and rhythm, S1, S2 normal, no murmur, click, rub or gallop Extremities: extremities normal, atraumatic, no cyanosis or edema  EKG normal sinus rhythm at 82 with nonspecific ST and T wave changes  ASSESSMENT AND PLAN:   HYPERTENSION Under good control on current medications  HYPERLIPIDEMIA On statin drug followed by Dr. Collins Scotland      Runell Gess MD [facility], [facility] 10/09/2012 2:13 PM

## 2012-11-25 ENCOUNTER — Encounter (INDEPENDENT_AMBULATORY_CARE_PROVIDER_SITE_OTHER): Payer: Self-pay | Admitting: Surgery

## 2012-11-26 DIAGNOSIS — G5600 Carpal tunnel syndrome, unspecified upper limb: Secondary | ICD-10-CM | POA: Insufficient documentation

## 2012-12-07 ENCOUNTER — Ambulatory Visit (INDEPENDENT_AMBULATORY_CARE_PROVIDER_SITE_OTHER): Payer: Medicare Other | Admitting: Surgery

## 2012-12-15 DIAGNOSIS — R209 Unspecified disturbances of skin sensation: Secondary | ICD-10-CM | POA: Insufficient documentation

## 2012-12-16 ENCOUNTER — Encounter (INDEPENDENT_AMBULATORY_CARE_PROVIDER_SITE_OTHER): Payer: Self-pay | Admitting: Surgery

## 2012-12-16 ENCOUNTER — Ambulatory Visit (INDEPENDENT_AMBULATORY_CARE_PROVIDER_SITE_OTHER): Payer: Medicare Other | Admitting: Surgery

## 2012-12-16 VITALS — BP 110/66 | HR 72 | Resp 16 | Ht 64.0 in | Wt 224.4 lb

## 2012-12-16 DIAGNOSIS — K828 Other specified diseases of gallbladder: Secondary | ICD-10-CM | POA: Insufficient documentation

## 2012-12-16 NOTE — Patient Instructions (Addendum)

## 2012-12-16 NOTE — Progress Notes (Signed)
Chief Complaint:  Recurrent bouts of right upper quadrant pain and biliary dyskinesia on HIDA  History of Present Illness:  Leslie Hendricks is an 72 y.o. female who presents with recurrent bouts of right upper quadrant pain usually occur after eating. She underwent a workup by Dr. Maxwell Marion and although she has no gallstones she was found to have a significantly decreased gallbladder emptying on HIDA.  I discussed laparoscopic cholecystectomy with her including the risk of common bile duct injury bile leaks or other bowel injuries. She realizes that it may not alleviate her pain. She would like to go ahead and proceed with laparoscopic cholecystectomy. Proper for to do this the [facility] we'll get her scheduled there.  Past Medical History  Diagnosis Date  . Diabetes mellitus   . Hypertension   . Cancer   . Uterine cancer dx'd 1991    surg only  . Arthritis   . Depression   . Back pain, chronic   . 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 Falls frequently     neurological workup inconclusive per pt  . Peripheral vascular disease   . Chronic kidney disease     hx  kidney stones  . Hyperlipidemia     Past Surgical History  Procedure Laterality Date  . Foot surgery      left toe  . Neck surgery  2009, 2010    x 2  . Nose surgery    . Back surgery  2002, 2003, 2006    x 3  . Abdominal hysterectomy  1991  . Hemorrhoid surgery    . Eye surgery      R eye   . Esophageal manometry  02/24/2012    Procedure: ESOPHAGEAL MANOMETRY (EM);  Surgeon: Christia Reading, MD;  Location: [facility];  Service: Endoscopy;  Laterality: N/A;  without impedience  . 24 hour ph study  02/24/2012    Procedure: 24 HOUR PH STUDY;  Surgeon: Christia Reading, MD;  Location: [facility];  Service: Endoscopy;  Laterality: N/A;  . 24 hour ph study  03/16/2012    Procedure: 24 HOUR PH STUDY;  Surgeon: Christia Reading, MD;  Location: [facility];  Service: Endoscopy;  Laterality: N/A;  Debbora Presto will credit the patient for Test on 11/11 and rebill for this date 03/16/12 Everrett Coombe AD   . Joint replacement      rt knee  . Esophagogastroduodenoscopy  05/08/2012    Procedure: ESOPHAGOGASTRODUODENOSCOPY (EGD);  Surgeon: Theda Belfast, MD;  Location: [facility];  Service: Endoscopy;  Laterality: N/A;  . Botox injection  05/08/2012    Procedure: BOTOX INJECTION;  Surgeon: Theda Belfast, MD;  Location: [facility];  Service: Endoscopy;  Laterality: N/A;    Current Outpatient Prescriptions  Medication Sig Dispense Refill  . Ascorbic Acid (VITAMIN C WITH ROSE HIPS) 500 MG tablet Take 500 mg by mouth every morning.       Marland Kitchen aspirin 325 MG EC tablet Take 325 mg by mouth every morning.      Marland Kitchen atorvastatin (LIPITOR) 80 MG tablet Take 80 mg by mouth at bedtime.       . Calcium Carbonate-Vitamin D (CALTRATE 600+D PO) Take 1 tablet by mouth every morning.       . cholecalciferol (VITAMIN D) 400 UNITS TABS Take 400 Units by mouth every morning.       . diazepam (VALIUM) 5 MG tablet Take 5 mg by mouth  at bedtime.       Marland Kitchen esomeprazole (NEXIUM) 40 MG capsule Take 40 mg by mouth daily before breakfast.      . fish oil-omega-3 fatty acids 1000 MG capsule Take 1 g by mouth every morning.       . folic acid (FOLVITE) 400 MCG tablet Take 400 mcg by mouth every morning.       . furosemide (LASIX) 20 MG tablet Take 20 mg by mouth every morning.       Marland Kitchen HYDROcodone-acetaminophen (NORCO) 10-325 MG per tablet Take 1 tablet by mouth every 6 (six) hours as needed. For pain      . metFORMIN (GLUMETZA) 500 MG (MOD) 24 hr tablet Take 500 mg by mouth daily with breakfast.       . multivitamin-lutein (OCUVITE-LUTEIN) CAPS Take 1 capsule by mouth at bedtime.       Marland Kitchen OVER THE COUNTER MEDICATION Take 250 mg by mouth every morning. Magnesium 250mg .      Bertram Gala Glycol-Propyl Glycol (SYSTANE OP) Place 1 drop into both eyes 2 (two) times daily.      . polyethylene glycol (MIRALAX / GLYCOLAX) packet Take 17  g by mouth at bedtime.       Marland Kitchen telmisartan-hydrochlorothiazide (MICARDIS HCT) 80-12.5 MG per tablet Take 1 tablet by mouth every morning.       . Thiamine HCl (VITAMIN B-1) 250 MG tablet Take 250 mg by mouth every morning.      . venlafaxine (EFFEXOR-XR) 75 MG 24 hr capsule Take 75 mg by mouth every morning.       . vitamin E 400 UNIT capsule Take 400 Units by mouth every morning.       . zinc gluconate 50 MG tablet Take 50 mg by mouth every morning.       . Alum Hydroxide-Mag Carbonate (GAVISCON EXTRA STRENGTH PO) Take 1 tablet by mouth daily. 2-4 tablets daily       No current facility-administered medications for this visit.   Banana; Codeine; Dilaudid; Oxycodone hcl er; and Sausage Family History  Problem Relation Age of Onset  . Cancer Mother     colon / uterus  . Stroke Mother   . Stroke Father    Social History:   reports that she has quit smoking. She does not have any smokeless tobacco history on file. She reports that she does not drink alcohol or use illicit drugs.   REVIEW OF SYSTEMS - PERTINENT POSITIVES ONLY:is positive for trouble swallowing, leg swelling, constipation, and arthritis. She denies any history of DVT. All other aspects of her review of systems are negative.  Physical Exam:   Blood pressure 110/66, pulse 72, resp. rate 16, height 5\' 4"  (1.626 m), weight 224 lb 6.4 oz (101.787 kg). Body mass index is 38.5 kg/(m^2).  Gen:  WDWN WF NAD  Neurological: Alert and oriented to person, place, and time. Motor and sensory function is grossly intact  Head: Normocephalic and atraumatic.  Eyes: Conjunctivae are normal. Pupils are equal, round, and reactive to light. No scleral icterus.  Neck: Normal range of motion. Neck supple. No tracheal deviation or thyromegaly present.  Cardiovascular:  SR without murmurs or gallops.  No carotid bruits Respiratory: Effort normal.  No respiratory distress. No chest wall tenderness. Breath sounds normal.  No wheezes, rales or  rhonchi.  Abdomen:  nontender at present GU: Musculoskeletal: Normal range of motion. Extremities are nontender. No cyanosis, edema or clubbing noted Lymphadenopathy: No cervical, preauricular, postauricular or axillary  adenopathy is present Skin: Skin is warm and dry. No rash noted. No diaphoresis. No erythema. No pallor. Pscyh: Normal mood and affect. Behavior is normal. Judgment and thought content normal.   LABORATORY RESULTS: No results found for this or any previous visit (from the past 48 hour(s)).  RADIOLOGY RESULTS: No results found.  Problem List: Patient Active Problem List   Diagnosis Date Noted  . Biliary dyskinesia 12/16/2012  . LEG PAIN, BILATERAL 06/05/2009  . DIABETES MELLITUS, TYPE II 05/31/2009  . HYPERLIPIDEMIA 05/31/2009  . MACULAR DEGENERATION 05/31/2009  . HYPERTENSION 05/31/2009  . OSTEOPOROSIS 05/31/2009  . PALPITATIONS 05/31/2009    Assessment & Plan: Biliary dyskinesia and abdominal pain.  Plan lap chole IOC at [facility] B. Daphine Deutscher, MD, [facility] [phone] beeper [phone]  12/16/2012 2:02 PM

## 2012-12-18 ENCOUNTER — Other Ambulatory Visit (HOSPITAL_COMMUNITY): Payer: Self-pay | Admitting: Surgery

## 2012-12-18 ENCOUNTER — Encounter (HOSPITAL_COMMUNITY): Payer: Self-pay | Admitting: Pharmacy Technician

## 2012-12-18 NOTE — Patient Instructions (Addendum)
18 Leslie Hendricks  12/18/2012   Your procedure is scheduled on: 12-22-2012  Report to [facility] at 1000 AM.  Call this number if you have problems the morning of surgery [phone]   Remember: FLEETS ENEMA NIGHT BEFORE SURGERY   Do not eat food or drink liquids :After Midnight.     Take these medicines the morning of surgery with A SIP OF WATER: hydrocodne if needed, nexium, systane eye drop, effexor                                SEE [facility] PREPARING FOR SURGERY SHEET   Do not wear jewelry, make-up.  Do not wear lotions, powders, or perfumes. You may wear deodorant.   Men may shave face and neck.  Do not bring valuables to the hospital. [facility] IS NOT RESPONSIBLE FOR VALUEABLES.  Contacts, dentures or bridgework may not be worn into surgery.  Leave suitcase in the car. After surgery it may be brought to your room.  For patients admitted to the hospital, checkout time is 11:00 AM the day of discharge.   Patients discharged the day of surgery will not be allowed to drive home.  Name and phone number of your driver:  Special Instructions: N/A   Please read over the following fact sheets that you were given:   Call Cain Sieve RN pre op nurse if needed [phone]    FAILURE TO FOLLOW THESE INSTRUCTIONS MAY RESULT IN THE CANCELLATION OF YOUR SURGERY.  PATIENT SIGNATURE___________________________________________  NURSE SIGNATURE_____________________________________________

## 2012-12-21 ENCOUNTER — Encounter (HOSPITAL_COMMUNITY): Payer: Self-pay

## 2012-12-21 ENCOUNTER — Ambulatory Visit (HOSPITAL_COMMUNITY)
Admission: RE | Admit: 2012-12-21 | Discharge: 2012-12-21 | Disposition: A | Discharge status: Home / Self Care | Payer: Medicare Other | Source: Ambulatory Visit | Attending: Surgery | Admitting: Surgery

## 2012-12-21 ENCOUNTER — Encounter (HOSPITAL_COMMUNITY)
Admission: RE | Admit: 2012-12-21 | Discharge: 2012-12-21 | Disposition: A | Discharge status: Home / Self Care | Payer: Medicare Other | Source: Ambulatory Visit | Attending: Surgery | Admitting: Surgery

## 2012-12-21 DIAGNOSIS — Z01812 Encounter for preprocedural laboratory examination: Secondary | ICD-10-CM | POA: Insufficient documentation

## 2012-12-21 DIAGNOSIS — K828 Other specified diseases of gallbladder: Secondary | ICD-10-CM | POA: Insufficient documentation

## 2012-12-21 DIAGNOSIS — Z01818 Encounter for other preprocedural examination: Secondary | ICD-10-CM | POA: Insufficient documentation

## 2012-12-21 DIAGNOSIS — I10 Essential (primary) hypertension: Secondary | ICD-10-CM | POA: Insufficient documentation

## 2012-12-21 HISTORY — DX: Personal history of urinary calculi: Z87.442

## 2012-12-21 LAB — CBC
HCT: 37.7 % (ref 36.0–46.0)
Hemoglobin: 12.6 g/dL (ref 12.0–15.0)
MCH: 32.8 pg (ref 26.0–34.0)
MCHC: 33.4 g/dL (ref 30.0–36.0)
MCV: 98.2 fL (ref 78.0–100.0)
Platelets: 226 10*3/uL (ref 150–400)
RBC: 3.84 MIL/uL — ABNORMAL LOW (ref 3.87–5.11)
RDW: 12.4 % (ref 11.5–15.5)
WBC: 5.5 10*3/uL (ref 4.0–10.5)

## 2012-12-21 LAB — BASIC METABOLIC PANEL
BUN: 21 mg/dL (ref 6–23)
CO2: 34 mEq/L — ABNORMAL HIGH (ref 19–32)
Calcium: 9.9 mg/dL (ref 8.4–10.5)
Chloride: 99 mEq/L (ref 96–112)
Creatinine, Ser: 0.89 mg/dL (ref 0.50–1.10)
GFR calc Af Amer: 72 mL/min — ABNORMAL LOW (ref >90)
GFR calc non Af Amer: 62 mL/min — ABNORMAL LOW (ref >90)
Glucose, Bld: 90 mg/dL (ref 70–99)
Potassium: 4.3 mEq/L (ref 3.5–5.1)
Sodium: 139 mEq/L (ref 135–145)

## 2012-12-21 NOTE — Progress Notes (Signed)
lov note and ekg dr berry 10-09-2012 epic

## 2012-12-22 ENCOUNTER — Encounter (HOSPITAL_COMMUNITY): Payer: Self-pay | Admitting: *Deleted

## 2012-12-22 ENCOUNTER — Ambulatory Visit (HOSPITAL_COMMUNITY): Payer: Medicare Other | Admitting: Anesthesiology

## 2012-12-22 ENCOUNTER — Observation Stay (HOSPITAL_COMMUNITY)
Admission: RE | Admit: 2012-12-22 | Discharge: 2012-12-23 | Disposition: A | Discharge status: Home / Self Care | Payer: Medicare Other | Source: Ambulatory Visit | Attending: Surgery | Admitting: Surgery

## 2012-12-22 ENCOUNTER — Ambulatory Visit (HOSPITAL_COMMUNITY): Payer: Medicare Other

## 2012-12-22 ENCOUNTER — Encounter (HOSPITAL_COMMUNITY)
Admission: RE | Disposition: A | Discharge status: Home / Self Care | Payer: Self-pay | Source: Ambulatory Visit | Attending: Surgery

## 2012-12-22 ENCOUNTER — Encounter (HOSPITAL_COMMUNITY): Payer: Self-pay | Admitting: Anesthesiology

## 2012-12-22 DIAGNOSIS — K828 Other specified diseases of gallbladder: Secondary | ICD-10-CM

## 2012-12-22 DIAGNOSIS — Z8542 Personal history of malignant neoplasm of other parts of uterus: Secondary | ICD-10-CM | POA: Insufficient documentation

## 2012-12-22 DIAGNOSIS — K811 Chronic cholecystitis: Secondary | ICD-10-CM

## 2012-12-22 DIAGNOSIS — Z96659 Presence of unspecified artificial knee joint: Secondary | ICD-10-CM | POA: Insufficient documentation

## 2012-12-22 DIAGNOSIS — E785 Hyperlipidemia, unspecified: Secondary | ICD-10-CM | POA: Insufficient documentation

## 2012-12-22 DIAGNOSIS — Z01818 Encounter for other preprocedural examination: Secondary | ICD-10-CM | POA: Insufficient documentation

## 2012-12-22 DIAGNOSIS — Z01812 Encounter for preprocedural laboratory examination: Secondary | ICD-10-CM | POA: Insufficient documentation

## 2012-12-22 DIAGNOSIS — E119 Type 2 diabetes mellitus without complications: Secondary | ICD-10-CM | POA: Insufficient documentation

## 2012-12-22 DIAGNOSIS — I10 Essential (primary) hypertension: Secondary | ICD-10-CM | POA: Insufficient documentation

## 2012-12-22 HISTORY — PX: CHOLECYSTECTOMY: SHX55

## 2012-12-22 LAB — CBC
HCT: 37.4 % (ref 36.0–46.0)
Hemoglobin: 12.5 g/dL (ref 12.0–15.0)
MCH: 32.8 pg (ref 26.0–34.0)
MCHC: 33.4 g/dL (ref 30.0–36.0)
MCV: 98.2 fL (ref 78.0–100.0)
Platelets: 205 10*3/uL (ref 150–400)
RBC: 3.81 MIL/uL — ABNORMAL LOW (ref 3.87–5.11)
RDW: 12.4 % (ref 11.5–15.5)
WBC: 6.5 10*3/uL (ref 4.0–10.5)

## 2012-12-22 LAB — SURGICAL PCR SCREEN
MRSA, PCR: NEGATIVE
Staphylococcus aureus: NEGATIVE

## 2012-12-22 LAB — GLUCOSE, CAPILLARY
Glucose-Capillary: 163 mg/dL — ABNORMAL HIGH (ref 70–99)
Glucose-Capillary: 177 mg/dL — ABNORMAL HIGH (ref 70–99)

## 2012-12-22 LAB — CREATININE, SERUM
Creatinine, Ser: 0.89 mg/dL (ref 0.50–1.10)
GFR calc Af Amer: 72 mL/min — ABNORMAL LOW (ref >90)
GFR calc non Af Amer: 62 mL/min — ABNORMAL LOW (ref >90)

## 2012-12-22 SURGERY — LAPAROSCOPIC CHOLECYSTECTOMY WITH INTRAOPERATIVE CHOLANGIOGRAM
Anesthesia: General | Site: Abdomen | Wound class: Clean Contaminated

## 2012-12-22 MED ORDER — ONDANSETRON HCL 4 MG/2ML IJ SOLN: 4.0000 mg | Freq: Four times a day (QID) | INTRAMUSCULAR | Status: DC | PRN

## 2012-12-22 MED ORDER — DEXAMETHASONE SODIUM PHOSPHATE 10 MG/ML IJ SOLN
INTRAMUSCULAR | Status: DC | PRN
  Administered 2012-12-22: 10 mg via INTRAVENOUS

## 2012-12-22 MED ORDER — BUPIVACAINE-EPINEPHRINE PF 0.25-1:200000 % IJ SOLN
INTRAMUSCULAR | Status: AC
  Filled 2012-12-22: qty 30

## 2012-12-22 MED ORDER — ONDANSETRON HCL 4 MG PO TABS: 4.0000 mg | ORAL_TABLET | Freq: Four times a day (QID) | ORAL | Status: DC | PRN

## 2012-12-22 MED ORDER — ACETAMINOPHEN 325 MG PO TABS
650.0000 mg | ORAL_TABLET | ORAL | Status: DC | PRN
  Administered 2012-12-22: 650 mg via ORAL
  Filled 2012-12-22: qty 2

## 2012-12-22 MED ORDER — HYDROMORPHONE HCL PF 1 MG/ML IJ SOLN
0.5000 mg | INTRAMUSCULAR | Status: DC | PRN
  Filled 2012-12-22: qty 1

## 2012-12-22 MED ORDER — FENTANYL CITRATE 0.05 MG/ML IJ SOLN
INTRAMUSCULAR | Status: AC
  Filled 2012-12-22: qty 2

## 2012-12-22 MED ORDER — SUCCINYLCHOLINE CHLORIDE 20 MG/ML IJ SOLN
INTRAMUSCULAR | Status: DC | PRN
  Administered 2012-12-22: 130 mg via INTRAVENOUS

## 2012-12-22 MED ORDER — BUPIVACAINE-EPINEPHRINE 0.25% -1:200000 IJ SOLN
INTRAMUSCULAR | Status: DC | PRN
  Administered 2012-12-22: 26 mL

## 2012-12-22 MED ORDER — PROPOFOL 10 MG/ML IV BOLUS
INTRAVENOUS | Status: DC | PRN
  Administered 2012-12-22: 150 mg via INTRAVENOUS

## 2012-12-22 MED ORDER — MUPIROCIN 2 % EX OINT
TOPICAL_OINTMENT | CUTANEOUS | Status: AC
  Filled 2012-12-22: qty 22

## 2012-12-22 MED ORDER — CEFAZOLIN SODIUM-DEXTROSE 2-3 GM-% IV SOLR
INTRAVENOUS | Status: AC
  Filled 2012-12-22: qty 50

## 2012-12-22 MED ORDER — GLYCOPYRROLATE 0.2 MG/ML IJ SOLN
INTRAMUSCULAR | Status: DC | PRN
  Administered 2012-12-22: 0.6 mg via INTRAVENOUS

## 2012-12-22 MED ORDER — HEPARIN SODIUM (PORCINE) 5000 UNIT/ML IJ SOLN
5000.0000 [IU] | Freq: Three times a day (TID) | INTRAMUSCULAR | Status: DC
  Administered 2012-12-22 – 2012-12-23 (×2): 5000 [IU] via SUBCUTANEOUS
  Filled 2012-12-22 (×5): qty 1

## 2012-12-22 MED ORDER — 0.9 % SODIUM CHLORIDE (POUR BTL) OPTIME
TOPICAL | Status: DC | PRN
  Administered 2012-12-22: 1000 mL

## 2012-12-22 MED ORDER — NEOSTIGMINE METHYLSULFATE 1 MG/ML IJ SOLN
INTRAMUSCULAR | Status: DC | PRN
  Administered 2012-12-22: 5 mg via INTRAVENOUS

## 2012-12-22 MED ORDER — INSULIN ASPART 100 UNIT/ML ~~LOC~~ SOLN
0.0000 [IU] | SUBCUTANEOUS | Status: DC
  Administered 2012-12-22: 5 [IU] via SUBCUTANEOUS
  Administered 2012-12-22 – 2012-12-23 (×2): 3 [IU] via SUBCUTANEOUS
  Administered 2012-12-23 (×2): 2 [IU] via SUBCUTANEOUS

## 2012-12-22 MED ORDER — FENTANYL CITRATE 0.05 MG/ML IJ SOLN
INTRAMUSCULAR | Status: DC | PRN
  Administered 2012-12-22 (×2): 50 ug via INTRAVENOUS

## 2012-12-22 MED ORDER — ONDANSETRON HCL 4 MG/2ML IJ SOLN
INTRAMUSCULAR | Status: DC | PRN
  Administered 2012-12-22 (×2): 2 mg via INTRAVENOUS

## 2012-12-22 MED ORDER — LACTATED RINGERS IV SOLN
INTRAVENOUS | Status: DC
  Administered 2012-12-22: 1000 mL via INTRAVENOUS
  Administered 2012-12-22: 13:00:00 via INTRAVENOUS

## 2012-12-22 MED ORDER — ROCURONIUM BROMIDE 100 MG/10ML IV SOLN
INTRAVENOUS | Status: DC | PRN
  Administered 2012-12-22: 30 mg via INTRAVENOUS

## 2012-12-22 MED ORDER — FENTANYL CITRATE 0.05 MG/ML IJ SOLN
25.0000 ug | INTRAMUSCULAR | Status: DC | PRN
  Administered 2012-12-22: 50 ug via INTRAVENOUS

## 2012-12-22 MED ORDER — FENTANYL CITRATE 0.05 MG/ML IJ SOLN
25.0000 ug | INTRAMUSCULAR | Status: DC | PRN
  Administered 2012-12-22 (×3): 50 ug via INTRAVENOUS

## 2012-12-22 MED ORDER — LACTATED RINGERS IV SOLN: INTRAVENOUS | Status: DC

## 2012-12-22 MED ORDER — LIDOCAINE HCL (CARDIAC) 20 MG/ML IV SOLN
INTRAVENOUS | Status: DC | PRN
  Administered 2012-12-22: 30 mg via INTRAVENOUS

## 2012-12-22 MED ORDER — MUPIROCIN 2 % EX OINT
TOPICAL_OINTMENT | Freq: Two times a day (BID) | CUTANEOUS | Status: DC
  Administered 2012-12-22: 10:00:00 via NASAL

## 2012-12-22 MED ORDER — IOHEXOL 300 MG/ML  SOLN
INTRAMUSCULAR | Status: DC | PRN
  Administered 2012-12-22: 3 mL

## 2012-12-22 MED ORDER — KCL IN DEXTROSE-NACL 20-5-0.45 MEQ/L-%-% IV SOLN
INTRAVENOUS | Status: DC
  Administered 2012-12-22 – 2012-12-23 (×2): via INTRAVENOUS
  Filled 2012-12-22 (×3): qty 1000

## 2012-12-22 MED ORDER — INFLUENZA VAC SPLIT QUAD 0.5 ML IM SUSP
0.5000 mL | INTRAMUSCULAR | Status: AC
  Administered 2012-12-23: 0.5 mL via INTRAMUSCULAR
  Filled 2012-12-22 (×2): qty 0.5

## 2012-12-22 MED ORDER — INSULIN GLARGINE 100 UNIT/ML ~~LOC~~ SOLN
10.0000 [IU] | Freq: Every day | SUBCUTANEOUS | Status: DC
  Administered 2012-12-22: 10 [IU] via SUBCUTANEOUS
  Filled 2012-12-22 (×3): qty 0.1

## 2012-12-22 MED ORDER — LACTATED RINGERS IR SOLN
Status: DC | PRN
  Administered 2012-12-22: 1

## 2012-12-22 MED ORDER — CEFAZOLIN SODIUM-DEXTROSE 2-3 GM-% IV SOLR
2.0000 g | INTRAVENOUS | Status: AC
  Administered 2012-12-22: 2 g via INTRAVENOUS

## 2012-12-22 MED ORDER — PROMETHAZINE HCL 25 MG/ML IJ SOLN: 6.2500 mg | INTRAMUSCULAR | Status: DC | PRN

## 2012-12-22 SURGICAL SUPPLY — 36 items
APPLIER CLIP 5 13 M/L LIGAMAX5 (MISCELLANEOUS)
APPLIER CLIP ROT 10 11.4 M/L (STAPLE) ×2
BENZOIN TINCTURE PRP APPL 2/3 (GAUZE/BANDAGES/DRESSINGS) IMPLANT
CABLE HIGH FREQUENCY MONO STRZ (ELECTRODE) IMPLANT
CANISTER SUCTION 2500CC (MISCELLANEOUS) ×2 IMPLANT
CATH REDDICK CHOLANGI 4FR 50CM (CATHETERS) ×2 IMPLANT
CLIP APPLIE 5 13 M/L LIGAMAX5 (MISCELLANEOUS) IMPLANT
CLIP APPLIE ROT 10 11.4 M/L (STAPLE) ×1 IMPLANT
CLOTH BEACON ORANGE TIMEOUT ST (SAFETY) ×2 IMPLANT
COVER MAYO STAND STRL (DRAPES) ×2 IMPLANT
DECANTER SPIKE VIAL GLASS SM (MISCELLANEOUS) IMPLANT
DRAPE C-ARM 42X120 X-RAY (DRAPES) ×2 IMPLANT
DRAPE LAPAROSCOPIC ABDOMINAL (DRAPES) ×2 IMPLANT
ELECT REM PT RETURN 9FT ADLT (ELECTROSURGICAL) ×2
ELECTRODE REM PT RTRN 9FT ADLT (ELECTROSURGICAL) ×1 IMPLANT
GLOVE BIOGEL M 8.0 STRL (GLOVE) ×2 IMPLANT
GOWN STRL REIN XL XLG (GOWN DISPOSABLE) ×4 IMPLANT
HEMOSTAT SURGICEL 4X8 (HEMOSTASIS) IMPLANT
IV CATH 14GX2 1/4 (CATHETERS) ×2 IMPLANT
KIT BASIN OR (CUSTOM PROCEDURE TRAY) ×2 IMPLANT
NS IRRIG 1000ML POUR BTL (IV SOLUTION) ×2 IMPLANT
POUCH SPECIMEN RETRIEVAL 10MM (ENDOMECHANICALS) ×2 IMPLANT
SCISSORS LAP 5X35 DISP (ENDOMECHANICALS) ×2 IMPLANT
SET IRRIG TUBING LAPAROSCOPIC (IRRIGATION / IRRIGATOR) ×2 IMPLANT
SLEEVE XCEL OPT CAN 5 100 (ENDOMECHANICALS) ×4 IMPLANT
SOLUTION ANTI FOG 6CC (MISCELLANEOUS) ×2 IMPLANT
STRIP CLOSURE SKIN 1/2X4 (GAUZE/BANDAGES/DRESSINGS) IMPLANT
SUT VIC AB 4-0 SH 18 (SUTURE) ×2 IMPLANT
SYR 20CC LL (SYRINGE) ×2 IMPLANT
TOWEL OR 17X26 10 PK STRL BLUE (TOWEL DISPOSABLE) ×2 IMPLANT
TOWEL OR NON WOVEN STRL DISP B (DISPOSABLE) ×2 IMPLANT
TRAY LAP CHOLE (CUSTOM PROCEDURE TRAY) ×2 IMPLANT
TROCAR BLADELESS OPT 5 100 (ENDOMECHANICALS) ×2 IMPLANT
TROCAR XCEL BLUNT TIP 100MML (ENDOMECHANICALS) IMPLANT
TROCAR XCEL NON-BLD 11X100MML (ENDOMECHANICALS) ×2 IMPLANT
TUBING INSUFFLATION 10FT LAP (TUBING) ×2 IMPLANT

## 2012-12-22 NOTE — Op Note (Signed)
Selin A Spiker @date @  Procedure: Laparoscopic Cholecystectomy with intraoperative cholangiogram  Surgeon: Wenda Low, MD, FACS Asst:  Gaynelle Adu, MD, FACS  Anes:  General  Drains: None  Findings: Chronic cholecystitis with walling off of the GB medially  Description of Procedure: The patient was taken to OR 11 and given general anesthesia.  The patient was prepped with PCMX and draped sterilely. A time out was performed.  Access to the abdomen was achieved with 5 mm Optiview through the right upper quadrant.  Port placement included a 11 mm in upper midline and 3 five mm trocars.    The gallbladder was visualized and the fundus was grasped and the gallbladder was elevated. Traction on the infundibulum allowed for successful demonstration of the critical view. Inflammatory changes were moderate.  The cystic duct was identified and clipped up on the gallbladder and an incision was made in the cystic duct and the Reddick catheter was inserted after milking the cystic duct of any debris. A dynamic cholangiogram was performed which demonstrated intrahepatic and duodenal filling.    The cystic duct was then triple clipped and divided, the cystic artery was double clipped and divided and then the gallbladder was removed from the gallbladder bed. Removal of the gallbladder from the gallbladder bed was straightforward.  The gallbladder was then placed in a bag and brought out through one of the 10 mm trocar sites. The gallbladder bed was inspected and no bleeding or bile leaks were seen.   Laparoscopic visualization was used when closing fascial defects for trocar sites.   Incisions were injected with marcaine and closed with 4-0 Vicryl and Dermabond on the skin.  Sponge and needle count were correct.    The patient was taken to the recovery room in satisfactory condition.

## 2012-12-22 NOTE — Interval H&P Note (Signed)
History and Physical Interval Note:  12/22/2012 9:10 AM  Leslie Hendricks  has presented today for surgery, with the diagnosis of biliary dyskinesia   The various methods of treatment have been discussed with the patient and family. After consideration of risks, benefits and other options for treatment, the patient has consented to  Procedure(s): LAPAROSCOPIC CHOLECYSTECTOMY WITH INTRAOPERATIVE CHOLANGIOGRAM (N/A) as a surgical intervention .  The patient's history has been reviewed, patient examined, no change in status, stable for surgery.  I have reviewed the patient's chart and labs.  Questions were answered to the patient's satisfaction.     Rafiq Bucklin B

## 2012-12-22 NOTE — H&P (View-Only) (Signed)
Chief Complaint:  Recurrent bouts of right upper quadrant pain and biliary dyskinesia on HIDA  History of Present Illness:  Leslie Hendricks is an 72 y.o. female who presents with recurrent bouts of right upper quadrant pain usually occur after eating. She underwent a workup by Dr. Tammy Speer and although she has no gallstones she was found to have a significantly decreased gallbladder emptying on HIDA.  I discussed laparoscopic cholecystectomy with her including the risk of common bile duct injury bile leaks or other bowel injuries. She realizes that it may not alleviate her pain. She would like to go ahead and proceed with laparoscopic cholecystectomy. Proper for to do this the [facility] we'll get her scheduled there.  Past Medical History  Diagnosis Date  . Diabetes mellitus   . Hypertension   . Cancer   . Uterine cancer dx'd 1991    surg only  . Arthritis   . Depression   . Back pain, chronic   . 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 Falls frequently     neurological workup inconclusive per pt  . Peripheral vascular disease   . Chronic kidney disease     hx  kidney stones  . Hyperlipidemia     Past Surgical History  Procedure Laterality Date  . Foot surgery      left toe  . Neck surgery  2009, 2010    x 2  . Nose surgery    . Back surgery  2002, 2003, 2006    x 3  . Abdominal hysterectomy  1991  . Hemorrhoid surgery    . Eye surgery      R eye   . Esophageal manometry  02/24/2012    Procedure: ESOPHAGEAL MANOMETRY (EM);  Surgeon: Dwight Bates, MD;  [facility];  Service: Endoscopy;  Laterality: N/A;  without impedience  . 24 hour ph study  02/24/2012    Procedure: 24 HOUR PH STUDY;  Surgeon: Dwight Bates, MD;  [facility]: [facility];  Service: Endoscopy;  Laterality: N/A;  . 24 hour ph study  03/16/2012    Procedure: 24 HOUR PH STUDY;  Surgeon: Dwight Bates, MD;  [facility]: [facility];  Service: Endoscopy;  Laterality: N/A;  Penny  Goldston will credit the patient for Test on 11/11 and rebill for this date 03/16/12 Shannon Hicks AD   . Joint replacement      rt knee  . Esophagogastroduodenoscopy  05/08/2012    Procedure: ESOPHAGOGASTRODUODENOSCOPY (EGD);  Surgeon: Patrick D Hung, MD;  Location: [facility];  Service: Endoscopy;  Laterality: N/A;  . Botox injection  05/08/2012    Procedure: BOTOX INJECTION;  Surgeon: Patrick D Hung, MD;  [facility];  Service: Endoscopy;  Laterality: N/A;    Current Outpatient Prescriptions  Medication Sig Dispense Refill  . Ascorbic Acid (VITAMIN C WITH ROSE HIPS) 500 MG tablet Take 500 mg by mouth every morning.       . aspirin 325 MG EC tablet Take 325 mg by mouth every morning.      . atorvastatin (LIPITOR) 80 MG tablet Take 80 mg by mouth at bedtime.       . Calcium Carbonate-Vitamin D (CALTRATE 600+D PO) Take 1 tablet by mouth every morning.       . cholecalciferol (VITAMIN D) 400 UNITS TABS Take 400 Units by mouth every morning.       . diazepam (VALIUM) 5 MG tablet Take 5 mg by mouth   at bedtime.       . esomeprazole (NEXIUM) 40 MG capsule Take 40 mg by mouth daily before breakfast.      . fish oil-omega-3 fatty acids 1000 MG capsule Take 1 g by mouth every morning.       . folic acid (FOLVITE) 400 MCG tablet Take 400 mcg by mouth every morning.       . furosemide (LASIX) 20 MG tablet Take 20 mg by mouth every morning.       . HYDROcodone-acetaminophen (NORCO) 10-325 MG per tablet Take 1 tablet by mouth every 6 (six) hours as needed. For pain      . metFORMIN (GLUMETZA) 500 MG (MOD) 24 hr tablet Take 500 mg by mouth daily with breakfast.       . multivitamin-lutein (OCUVITE-LUTEIN) CAPS Take 1 capsule by mouth at bedtime.       . OVER THE COUNTER MEDICATION Take 250 mg by mouth every morning. Magnesium 250mg.      . Polyethyl Glycol-Propyl Glycol (SYSTANE OP) Place 1 drop into both eyes 2 (two) times daily.      . polyethylene glycol (MIRALAX / GLYCOLAX) packet Take 17  g by mouth at bedtime.       . telmisartan-hydrochlorothiazide (MICARDIS HCT) 80-12.5 MG per tablet Take 1 tablet by mouth every morning.       . Thiamine HCl (VITAMIN B-1) 250 MG tablet Take 250 mg by mouth every morning.      . venlafaxine (EFFEXOR-XR) 75 MG 24 hr capsule Take 75 mg by mouth every morning.       . vitamin E 400 UNIT capsule Take 400 Units by mouth every morning.       . zinc gluconate 50 MG tablet Take 50 mg by mouth every morning.       . Alum Hydroxide-Mag Carbonate (GAVISCON EXTRA STRENGTH PO) Take 1 tablet by mouth daily. 2-4 tablets daily       No current facility-administered medications for this visit.   Banana; Codeine; Dilaudid; Oxycodone hcl er; and Sausage Family History  Problem Relation Age of Onset  . Cancer Mother     colon / uterus  . Stroke Mother   . Stroke Father    Social History:   reports that she has quit smoking. She does not have any smokeless tobacco history on file. She reports that she does not drink alcohol or use illicit drugs.   REVIEW OF SYSTEMS - PERTINENT POSITIVES ONLY:is positive for trouble swallowing, leg swelling, constipation, and arthritis. She denies any history of DVT. All other aspects of her review of systems are negative.  Physical Exam:   Blood pressure 110/66, pulse 72, resp. rate 16, height 5' 4" (1.626 m), weight 224 lb 6.4 oz (101.787 kg). Body mass index is 38.5 kg/(m^2).  Gen:  WDWN WF NAD  Neurological: Alert and oriented to person, place, and time. Motor and sensory function is grossly intact  Head: Normocephalic and atraumatic.  Eyes: Conjunctivae are normal. Pupils are equal, round, and reactive to light. No scleral icterus.  Neck: Normal range of motion. Neck supple. No tracheal deviation or thyromegaly present.  Cardiovascular:  SR without murmurs or gallops.  No carotid bruits Respiratory: Effort normal.  No respiratory distress. No chest wall tenderness. Breath sounds normal.  No wheezes, rales or  rhonchi.  Abdomen:  nontender at present GU: Musculoskeletal: Normal range of motion. Extremities are nontender. No cyanosis, edema or clubbing noted Lymphadenopathy: No cervical, preauricular, postauricular or axillary   adenopathy is present Skin: Skin is warm and dry. No rash noted. No diaphoresis. No erythema. No pallor. Pscyh: Normal mood and affect. Behavior is normal. Judgment and thought content normal.   LABORATORY RESULTS: No results found for this or any previous visit (from the past 48 hour(s)).  RADIOLOGY RESULTS: No results found.  Problem List: Patient Active Problem List   Diagnosis Date Noted  . Biliary dyskinesia 12/16/2012  . LEG PAIN, BILATERAL 06/05/2009  . DIABETES MELLITUS, TYPE II 05/31/2009  . HYPERLIPIDEMIA 05/31/2009  . MACULAR DEGENERATION 05/31/2009  . HYPERTENSION 05/31/2009  . OSTEOPOROSIS 05/31/2009  . PALPITATIONS 05/31/2009    Assessment & Plan: Biliary dyskinesia and abdominal pain.  Plan lap chole IOC at [facility]    Matt B. Bryor Rami, MD, FACS  [facility] [phone] beeper [phone]  12/16/2012 2:02 PM     

## 2012-12-22 NOTE — Transfer of Care (Signed)
Immediate Anesthesia Transfer of Care Note  Patient: Leslie Hendricks  Procedure(s) Performed: Procedure(s): LAPAROSCOPIC CHOLECYSTECTOMY WITH INTRAOPERATIVE CHOLANGIOGRAM (N/A)  Patient Location: PACU  Anesthesia Type:General  Level of Consciousness: sedated  Airway & Oxygen Therapy: Patient Spontanous Breathing and Patient connected to face mask oxygen  Post-op Assessment: Report given to PACU RN and Post -op Vital signs reviewed and stable  Post vital signs: Reviewed and stable  Complications: No apparent anesthesia complications

## 2012-12-22 NOTE — Anesthesia Preprocedure Evaluation (Signed)
Anesthesia Evaluation  Patient identified by MRN, date of birth, ID band Patient awake    Reviewed: Allergy & Precautions, H&P , NPO status , Patient's Chart, lab work & pertinent test results  Airway Mallampati: II  Neck ROM: full    Dental  (+) Edentulous Upper and Edentulous Lower   Pulmonary neg pulmonary ROS, former smoker,  breath sounds clear to auscultation  Pulmonary exam normal       Cardiovascular hypertension, Pt. on medications + Peripheral Vascular Disease Rhythm:Regular Rate:Normal     Neuro/Psych PSYCHIATRIC DISORDERS Depression negative neurological ROS     GI/Hepatic negative GI ROS, Neg liver ROS,   Endo/Other  diabetes, Type 2, Oral Hypoglycemic AgentsMorbid obesity  Renal/GU negative Renal ROS  negative genitourinary   Musculoskeletal  (+) Arthritis -,   Abdominal   Peds negative pediatric ROS (+)  Hematology negative hematology ROS (+)   Anesthesia Other Findings   Reproductive/Obstetrics                           Anesthesia Physical Anesthesia Plan  ASA: II  Anesthesia Plan: General   Post-op Pain Management:    Induction: Intravenous  Airway Management Planned: Oral ETT  Additional Equipment:   Intra-op Plan:   Post-operative Plan: Extubation in OR  Informed Consent: I have reviewed the patients History and Physical, chart, labs and discussed the procedure including the risks, benefits and alternatives for the proposed anesthesia with the patient or authorized representative who has indicated his/her understanding and acceptance.   Dental advisory given  Plan Discussed with: CRNA  Anesthesia Plan Comments:         Anesthesia Quick Evaluation

## 2012-12-23 ENCOUNTER — Encounter (HOSPITAL_COMMUNITY): Payer: Self-pay | Admitting: Surgery

## 2012-12-23 ENCOUNTER — Observation Stay (HOSPITAL_COMMUNITY): Payer: Medicare Other

## 2012-12-23 LAB — GLUCOSE, CAPILLARY
Glucose-Capillary: 100 mg/dL — ABNORMAL HIGH (ref 70–99)
Glucose-Capillary: 108 mg/dL — ABNORMAL HIGH (ref 70–99)
Glucose-Capillary: 141 mg/dL — ABNORMAL HIGH (ref 70–99)
Glucose-Capillary: 148 mg/dL — ABNORMAL HIGH (ref 70–99)
Glucose-Capillary: 175 mg/dL — ABNORMAL HIGH (ref 70–99)
Glucose-Capillary: 245 mg/dL — ABNORMAL HIGH (ref 70–99)

## 2012-12-23 LAB — COMPREHENSIVE METABOLIC PANEL
ALT: 79 U/L — ABNORMAL HIGH (ref 0–35)
AST: 70 U/L — ABNORMAL HIGH (ref 0–37)
Albumin: 3 g/dL — ABNORMAL LOW (ref 3.5–5.2)
Alkaline Phosphatase: 66 U/L (ref 39–117)
BUN: 17 mg/dL (ref 6–23)
CO2: 31 mEq/L (ref 19–32)
Calcium: 9.3 mg/dL (ref 8.4–10.5)
Chloride: 100 mEq/L (ref 96–112)
Creatinine, Ser: 0.76 mg/dL (ref 0.50–1.10)
GFR calc Af Amer: >90 mL/min (ref >90)
GFR calc non Af Amer: 81 mL/min — ABNORMAL LOW (ref >90)
Glucose, Bld: 160 mg/dL — ABNORMAL HIGH (ref 70–99)
Potassium: 3.8 mEq/L (ref 3.5–5.1)
Sodium: 135 mEq/L (ref 135–145)
Total Bilirubin: 0.5 mg/dL (ref 0.3–1.2)
Total Protein: 6.1 g/dL (ref 6.0–8.3)

## 2012-12-23 LAB — CBC
HCT: 34 % — ABNORMAL LOW (ref 36.0–46.0)
Hemoglobin: 11.5 g/dL — ABNORMAL LOW (ref 12.0–15.0)
MCH: 33 pg (ref 26.0–34.0)
MCHC: 33.8 g/dL (ref 30.0–36.0)
MCV: 97.7 fL (ref 78.0–100.0)
Platelets: 190 10*3/uL (ref 150–400)
RBC: 3.48 MIL/uL — ABNORMAL LOW (ref 3.87–5.11)
RDW: 12.2 % (ref 11.5–15.5)
WBC: 8.8 10*3/uL (ref 4.0–10.5)

## 2012-12-23 MED ORDER — HYDROCODONE-ACETAMINOPHEN 5-325 MG PO TABS: 1.0000 | ORAL_TABLET | Freq: Four times a day (QID) | ORAL | Status: DC | PRN

## 2012-12-23 NOTE — Care Management Note (Signed)
    Page 1 of 2   12/23/2012     1:53:24 PM   CARE MANAGEMENT NOTE 12/23/2012  Patient:  Leslie Hendricks, Leslie Hendricks   Account Number:  [id number]  Date Initiated:  12/23/2012  Documentation initiated by:  Lorenda Ishihara  Subjective/Objective Assessment:   72 yo female admitted s/p lap chole. PTA lived at home alone.     Action/Plan:   Home when stable   Anticipated DC Date:  12/23/2012   Anticipated DC Plan:  HOME W HOME HEALTH SERVICES      DC Planning Services  CM consult      PAC Choice  DURABLE MEDICAL EQUIPMENT  HOME HEALTH   Choice offered to / List presented to:  C-1 Patient   DME arranged  Levan Hurst      DME agency  [facility]     [facility] arranged  HH-1 RN  HH-4 NURSE'S AIDE      HH agency  [facility]   Status of service:  Completed, signed off Medicare Important Message given?   (If response is "NO", the following Medicare IM given date fields will be blank) Date Medicare IM given:   Date Additional Medicare IM given:    Discharge Disposition:  HOME W HOME HEALTH SERVICES  Per UR Regulation:  Reviewed for med. necessity/level of care/duration of stay  If discussed at Long Length of Stay Meetings, dates discussed:    Comments:  12-23-12 Lorenda Ishihara RN CM 1000 Spoke with patient at bedside. States she has been falling at home. [facility] HH options, will request [facility] RN for safety eval., requesting Rolling walker as hers is old and in need of repair. Has chosen [facility] for [facility] services, contacted them and they will provide DME and services.

## 2012-12-23 NOTE — Progress Notes (Signed)
Pt for d/c home today with [facility] RN to evaluate safety needs & HH Aide per [facility]. IV d/c'd. Dermabond sites to abdomen-CDI. Awaiting for RW delivery prior to d/c. Family at bedside to assist with d/c. Discharge instructions & Rx given with verbalized understanding. Pt eating Regular food for lunch & tolerating well.

## 2012-12-23 NOTE — Discharge Summary (Signed)
Physician Discharge Summary  Patient ID: Leslie Hendricks MRN: [id number] DOB/AGE: 74/18/1942 74 y.o.  Admit date: 12/22/2012 Discharge date: 12/23/2012  Admission Diagnoses:  Biliary dyskinesia  Discharge Diagnoses:  Chronic cholecystitis  Active Problems:   * No active hospital problems. *   Surgery:  Lap chole with IOC  Discharged Condition: improved  Hospital Course:   Had surgery and admitted for observation.  DM managed.  Complained of neck pain from old fall.  Xray of c spine ordered and pending.  Ready for discharge.   Consults: none  Significant Diagnostic Studies: IOC, c spine xray    Discharge Exam: Blood pressure 130/48, pulse 56, temperature 98 F (36.7 C), temperature source Oral, resp. rate 18, height 5' 4.5" (1.638 m), weight 224 lb 1.7 oz (101.654 kg), SpO2 98.00%. Incisions ok.  Sore as expected.    Disposition: 01-Home or Self Care  Discharge Orders   Future Orders Complete By Expires   Diet - low sodium heart healthy  As directed    Discharge instructions  As directed    Comments:     May shower and shampoo when home   Increase activity slowly  As directed    No wound care  As directed        Medication List         aspirin 325 MG EC tablet  Take 325 mg by mouth every morning.     atorvastatin 80 MG tablet  Commonly known as:  LIPITOR  Take 80 mg by mouth at bedtime.     CALTRATE 600+D PO  Take 1 tablet by mouth every morning.     cholecalciferol 1000 UNITS tablet  Commonly known as:  VITAMIN D  Take 1,000 Units by mouth daily.     diazepam 5 MG tablet  Commonly known as:  VALIUM  Take 5 mg by mouth at bedtime.     esomeprazole 40 MG capsule  Commonly known as:  NEXIUM  Take 40 mg by mouth daily before breakfast.     fish oil-omega-3 fatty acids 1000 MG capsule  Take 1 g by mouth every morning.     folic acid 400 MCG tablet  Commonly known as:  FOLVITE  Take 800 mcg by mouth every morning.     furosemide 20 MG tablet   Commonly known as:  LASIX  Take 20 mg by mouth every morning.     HYDROcodone-acetaminophen 10-325 MG per tablet  Commonly known as:  NORCO  Take 1 tablet by mouth every 6 (six) hours as needed for pain.     HYDROcodone-acetaminophen 5-325 MG per tablet  Commonly known as:  NORCO  Take 1-2 tablets by mouth every 6 (six) hours as needed for pain.     metformin 500 MG (OSM) 24 hr tablet  Commonly known as:  FORTAMET  Take 500 mg by mouth daily with breakfast.     multivitamin-lutein Caps capsule  Take 1 capsule by mouth at bedtime.     OVER THE COUNTER MEDICATION  Take 250 mg by mouth every morning. Magnesium 250mg .     Sennosides 25 MG Tabs  Take 50 mg by mouth at bedtime.     SYSTANE OP  Place 1 drop into both eyes 2 (two) times daily.     telmisartan-hydrochlorothiazide 80-12.5 MG per tablet  Commonly known as:  MICARDIS HCT  Take 1 tablet by mouth daily with breakfast.     venlafaxine XR 75 MG 24 hr capsule  Commonly known  as:  EFFEXOR-XR  Take 75 mg by mouth daily with breakfast.     vitamin B-1 250 MG tablet  Take 250 mg by mouth every morning.     vitamin C 1000 MG tablet  Take 1,000 mg by mouth daily.     vitamin E 400 UNIT capsule  Take 400 Units by mouth every morning.     zinc gluconate 50 MG tablet  Take 50 mg by mouth every morning.           Follow-up Information   Follow up with Luretha Murphy B, MD In 4 weeks.   Specialty:  General Surgery   Contact information:   [address] [place] [address] [phone]       Signed: Valarie Merino 12/23/2012, 8:53 AM

## 2012-12-25 NOTE — Anesthesia Postprocedure Evaluation (Signed)
Anesthesia Post Note  Patient: Leslie Hendricks  Procedure(s) Performed: Procedure(s) (LRB): LAPAROSCOPIC CHOLECYSTECTOMY WITH INTRAOPERATIVE CHOLANGIOGRAM (N/A)  Anesthesia type: General  Patient location: PACU  Post pain: Pain level controlled  Post assessment: Post-op Vital signs reviewed  Last Vitals:  Filed Vitals:   12/23/12 0609  BP: 130/48  Pulse: 56  Temp: 36.7 C  Resp: 18    Post vital signs: Reviewed  Level of consciousness: sedated  Complications: No apparent anesthesia complications

## 2013-01-21 ENCOUNTER — Encounter (INDEPENDENT_AMBULATORY_CARE_PROVIDER_SITE_OTHER): Payer: Self-pay | Admitting: Surgery

## 2013-01-21 ENCOUNTER — Other Ambulatory Visit: Payer: Self-pay | Admitting: Neurosurgery

## 2013-01-21 ENCOUNTER — Ambulatory Visit (INDEPENDENT_AMBULATORY_CARE_PROVIDER_SITE_OTHER): Payer: Medicare Other | Admitting: Surgery

## 2013-01-21 VITALS — BP 134/68 | HR 64 | Resp 16 | Ht 64.5 in | Wt 224.8 lb

## 2013-01-21 DIAGNOSIS — Z9889 Other specified postprocedural states: Secondary | ICD-10-CM

## 2013-01-21 DIAGNOSIS — Z9049 Acquired absence of other specified parts of digestive tract: Secondary | ICD-10-CM | POA: Insufficient documentation

## 2013-01-21 DIAGNOSIS — M542 Cervicalgia: Secondary | ICD-10-CM

## 2013-01-21 NOTE — Patient Instructions (Signed)
Thanks for your patience.  If you need further assistance after leaving the office, please call our office and speak with a [facility] nurse.  [phone].  If you want to leave a message for Dr. Rashad Auld, please call his office phone at [phone]. 

## 2013-01-21 NOTE — Progress Notes (Signed)
Leslie Hendricks 72 y.o.  Body mass index is 38 kg/(m^2).  Patient Active Problem List   Diagnosis Date Noted  . Biliary dyskinesia 12/16/2012  . LEG PAIN, BILATERAL 06/05/2009  . DIABETES MELLITUS, TYPE II 05/31/2009  . HYPERLIPIDEMIA 05/31/2009  . MACULAR DEGENERATION 05/31/2009  . HYPERTENSION 05/31/2009  . OSTEOPOROSIS 05/31/2009  . PALPITATIONS 05/31/2009    Allergies  Allergen Reactions  . Banana Nausea And Vomiting  . Codeine Nausea And Vomiting  . Dilaudid [Hydromorphone] Other (See Comments)    Altered mental status  . Oxycodone Hcl Nausea And Vomiting  . Sausage [Pickled Meat] Nausea And Vomiting    Past Surgical History  Procedure Laterality Date  . Foot surgery      left toe  . Nose surgery      for fx  . Hemorrhoid surgery  yrs ago  . Esophageal manometry  02/24/2012    Procedure: ESOPHAGEAL MANOMETRY (EM);  Surgeon: Christia Reading, MD;  Location: [facility];  Service: Endoscopy;  Laterality: N/A;  without impedience  . 24 hour ph study  02/24/2012    Procedure: 24 HOUR PH STUDY;  Surgeon: Christia Reading, MD;  Location: [facility];  Service: Endoscopy;  Laterality: N/A;  . 24 hour ph study  03/16/2012    Procedure: 24 HOUR PH STUDY;  Surgeon: Christia Reading, MD;  Location: [facility];  Service: Endoscopy;  Laterality: N/A;  Debbora Presto will credit the patient for Test on 11/11 and rebill for this date 03/16/12 Everrett Coombe AD   . Esophagogastroduodenoscopy  05/08/2012    Procedure: ESOPHAGOGASTRODUODENOSCOPY (EGD);  Surgeon: Theda Belfast, MD;  Location: [facility];  Service: Endoscopy;  Laterality: N/A;  . Botox injection  05/08/2012    Procedure: BOTOX INJECTION;  Surgeon: Theda Belfast, MD;  Location: [facility];  Service: Endoscopy;  Laterality: N/A;  . Back surgery  2002, 2003, 2006    x 3 lower  . Cervical spine surgery  nov 2011  and 2013    x 2, trouble turning neck to right  . Eye surgery      R eye   . Abdominal hysterectomy  1991   complete  . Joint replacement  8 yrs ago    rt knee  . Foot surgery Left     1 st joint 2 nd to removed  . Cholecystectomy N/A 12/22/2012    Procedure: LAPAROSCOPIC CHOLECYSTECTOMY WITH INTRAOPERATIVE CHOLANGIOGRAM;  Surgeon: Valarie Merino, MD;  Location: [facility];  Service: General;  Laterality: N/A;   Herb Grays, MD No diagnosis found.  Doing well after her laparoscopic cholecystectomy. She reports no problems. Her incisions looked good. I will be happy see her again whenever needed. Matt B. Daphine Deutscher, MD, [facility] [phone] beeper [phone]  01/21/2013 1:21 PM

## 2013-01-27 ENCOUNTER — Ambulatory Visit
Admission: RE | Admit: 2013-01-27 | Discharge: 2013-01-27 | Disposition: A | Discharge status: Home / Self Care | Payer: Medicare Other | Source: Ambulatory Visit | Attending: Neurosurgery | Admitting: Neurosurgery

## 2013-01-27 DIAGNOSIS — M542 Cervicalgia: Secondary | ICD-10-CM

## 2013-01-28 ENCOUNTER — Other Ambulatory Visit: Payer: Medicare Other

## 2013-02-01 ENCOUNTER — Other Ambulatory Visit: Payer: Self-pay | Admitting: Neurosurgery

## 2013-02-09 ENCOUNTER — Encounter (HOSPITAL_COMMUNITY): Payer: Self-pay

## 2013-02-09 NOTE — Pre-Procedure Instructions (Signed)
Leslie Hendricks  02/09/2013   Your procedure is scheduled on:  Mon, Nov 3 @ 9:30 AM  Report to [facility] A at 6:30 AM.  Call this number if you have problems the morning of surgery: [phone]   Remember:   Do not eat food or drink liquids after midnight.   Take these medicines the morning of surgery with A SIP OF WATER: Pain Pill(if needed) and Effexor Venlafaxine)                             Stop taking your Aspirin and Fish Oil. No Goody's,BC's,Aleve,Ibuprofen,or any Herbal Medications    Do not wear jewelry, make-up or nail polish.  Do not wear lotions, powders, or perfumes. You may wear deodorant.  Do not shave 48 hours prior to surgery.   Do not bring valuables to the hospital.  [facility] is not responsible                  for any belongings or valuables.               Contacts, dentures or bridgework may not be worn into surgery.  Leave suitcase in the car. After surgery it may be brought to your room.  For patients admitted to the hospital, discharge time is determined by your                treatment team.               Patients discharged the day of surgery will not be allowed to drive  home.    Special Instructions: Shower using CHG 2 nights before surgery and the night before surgery.  If you shower the day of surgery use CHG.  Use special wash - you have one bottle of CHG for all showers.  You should use approximately 1/3 of the bottle for each shower.   Please read over the following fact sheets that you were given: Pain Booklet, Coughing and Deep Breathing, MRSA Information and Surgical Site Infection Prevention

## 2013-02-10 ENCOUNTER — Encounter (HOSPITAL_COMMUNITY)
Admission: RE | Admit: 2013-02-10 | Discharge: 2013-02-10 | Disposition: A | Discharge status: Home / Self Care | Payer: Medicare Other | Source: Ambulatory Visit | Attending: Neurosurgery | Admitting: Neurosurgery

## 2013-02-10 ENCOUNTER — Encounter (HOSPITAL_COMMUNITY): Payer: Self-pay

## 2013-02-10 DIAGNOSIS — Z01818 Encounter for other preprocedural examination: Secondary | ICD-10-CM | POA: Insufficient documentation

## 2013-02-10 DIAGNOSIS — Z01812 Encounter for preprocedural laboratory examination: Secondary | ICD-10-CM | POA: Insufficient documentation

## 2013-02-10 HISTORY — DX: Personal history of other diseases of the musculoskeletal system and connective tissue: Z87.39

## 2013-02-10 HISTORY — DX: Personal history of Methicillin resistant Staphylococcus aureus infection: Z86.14

## 2013-02-10 HISTORY — DX: Localized edema: R60.0

## 2013-02-10 HISTORY — DX: Restless legs syndrome: G25.81

## 2013-02-10 HISTORY — DX: Gastro-esophageal reflux disease without esophagitis: K21.9

## 2013-02-10 HISTORY — DX: Edema, unspecified: R60.9

## 2013-02-10 HISTORY — DX: Polyneuropathy, unspecified: G62.9

## 2013-02-10 HISTORY — DX: Constipation, unspecified: K59.00

## 2013-02-10 HISTORY — DX: Unspecified visual loss: H54.7

## 2013-02-10 HISTORY — DX: Pain in unspecified joint: M25.50

## 2013-02-10 HISTORY — DX: Vitamin D deficiency, unspecified: E55.9

## 2013-02-10 HISTORY — DX: Weakness: R53.1

## 2013-02-10 HISTORY — DX: Personal history of other diseases of the respiratory system: Z87.09

## 2013-02-10 HISTORY — DX: Nocturia: R35.1

## 2013-02-10 HISTORY — DX: Paresthesia of skin: R20.2

## 2013-02-10 HISTORY — DX: Anemia, unspecified: D64.9

## 2013-02-10 LAB — CBC
HCT: 36.4 % (ref 36.0–46.0)
Hemoglobin: 12.1 g/dL (ref 12.0–15.0)
MCH: 32.6 pg (ref 26.0–34.0)
MCHC: 33.2 g/dL (ref 30.0–36.0)
MCV: 98.1 fL (ref 78.0–100.0)
Platelets: 207 10*3/uL (ref 150–400)
RBC: 3.71 MIL/uL — ABNORMAL LOW (ref 3.87–5.11)
RDW: 12.7 % (ref 11.5–15.5)
WBC: 5.2 10*3/uL (ref 4.0–10.5)

## 2013-02-10 LAB — BASIC METABOLIC PANEL
BUN: 21 mg/dL (ref 6–23)
CO2: 31 mEq/L (ref 19–32)
Calcium: 9.6 mg/dL (ref 8.4–10.5)
Chloride: 100 mEq/L (ref 96–112)
Creatinine, Ser: 0.9 mg/dL (ref 0.50–1.10)
GFR calc Af Amer: 71 mL/min — ABNORMAL LOW (ref >90)
GFR calc non Af Amer: 61 mL/min — ABNORMAL LOW (ref >90)
Glucose, Bld: 105 mg/dL — ABNORMAL HIGH (ref 70–99)
Potassium: 3.9 mEq/L (ref 3.5–5.1)
Sodium: 141 mEq/L (ref 135–145)

## 2013-02-10 LAB — SURGICAL PCR SCREEN
MRSA, PCR: NEGATIVE
Staphylococcus aureus: NEGATIVE

## 2013-02-10 NOTE — Progress Notes (Addendum)
  Cardiologist is Dr.Berry and has seen him this year-visit in epic  Denies ever having a heart cath  Medical MD is Dr.Tammy Collins [place]    Echo in epic from 2011  Stress test in epic from 2006  EKG in epic from 10-09-12  CXR in epic from 12-21-12

## 2013-02-14 MED ORDER — CEFAZOLIN SODIUM-DEXTROSE 2-3 GM-% IV SOLR
2.0000 g | INTRAVENOUS | Status: AC
  Administered 2013-02-15: 2 g via INTRAVENOUS
  Filled 2013-02-14: qty 50

## 2013-02-15 ENCOUNTER — Inpatient Hospital Stay (HOSPITAL_COMMUNITY): Payer: Medicare Other

## 2013-02-15 ENCOUNTER — Inpatient Hospital Stay (HOSPITAL_COMMUNITY)
Admission: RE | Admit: 2013-02-15 | Discharge: 2013-02-18 | DRG: 473 | Disposition: A | Discharge status: Skilled Nursing Facility | Payer: Medicare Other | Source: Ambulatory Visit | Attending: Neurosurgery | Admitting: Neurosurgery

## 2013-02-15 ENCOUNTER — Encounter (HOSPITAL_COMMUNITY): Payer: Self-pay | Admitting: *Deleted

## 2013-02-15 ENCOUNTER — Inpatient Hospital Stay (HOSPITAL_COMMUNITY): Payer: Medicare Other | Admitting: Anesthesiology

## 2013-02-15 ENCOUNTER — Encounter (HOSPITAL_COMMUNITY)
Admission: RE | Disposition: A | Discharge status: Skilled Nursing Facility | Payer: Self-pay | Source: Ambulatory Visit | Attending: Neurosurgery

## 2013-02-15 ENCOUNTER — Encounter (HOSPITAL_COMMUNITY): Payer: Medicare Other | Admitting: Anesthesiology

## 2013-02-15 DIAGNOSIS — I739 Peripheral vascular disease, unspecified: Secondary | ICD-10-CM | POA: Diagnosis present

## 2013-02-15 DIAGNOSIS — Z79899 Other long term (current) drug therapy: Secondary | ICD-10-CM

## 2013-02-15 DIAGNOSIS — Z8614 Personal history of Methicillin resistant Staphylococcus aureus infection: Secondary | ICD-10-CM

## 2013-02-15 DIAGNOSIS — H3530 Unspecified macular degeneration: Secondary | ICD-10-CM | POA: Diagnosis present

## 2013-02-15 DIAGNOSIS — E785 Hyperlipidemia, unspecified: Secondary | ICD-10-CM | POA: Diagnosis present

## 2013-02-15 DIAGNOSIS — Z823 Family history of stroke: Secondary | ICD-10-CM

## 2013-02-15 DIAGNOSIS — G609 Hereditary and idiopathic neuropathy, unspecified: Secondary | ICD-10-CM | POA: Diagnosis present

## 2013-02-15 DIAGNOSIS — F329 Major depressive disorder, single episode, unspecified: Secondary | ICD-10-CM | POA: Diagnosis present

## 2013-02-15 DIAGNOSIS — Z9849 Cataract extraction status, unspecified eye: Secondary | ICD-10-CM

## 2013-02-15 DIAGNOSIS — H5440 Blindness, one eye, unspecified eye: Secondary | ICD-10-CM | POA: Diagnosis present

## 2013-02-15 DIAGNOSIS — Z8542 Personal history of malignant neoplasm of other parts of uterus: Secondary | ICD-10-CM

## 2013-02-15 DIAGNOSIS — M109 Gout, unspecified: Secondary | ICD-10-CM | POA: Diagnosis present

## 2013-02-15 DIAGNOSIS — Z87891 Personal history of nicotine dependence: Secondary | ICD-10-CM

## 2013-02-15 DIAGNOSIS — K219 Gastro-esophageal reflux disease without esophagitis: Secondary | ICD-10-CM | POA: Diagnosis present

## 2013-02-15 DIAGNOSIS — E559 Vitamin D deficiency, unspecified: Secondary | ICD-10-CM | POA: Diagnosis present

## 2013-02-15 DIAGNOSIS — Z7982 Long term (current) use of aspirin: Secondary | ICD-10-CM

## 2013-02-15 DIAGNOSIS — I10 Essential (primary) hypertension: Secondary | ICD-10-CM | POA: Diagnosis present

## 2013-02-15 DIAGNOSIS — E119 Type 2 diabetes mellitus without complications: Secondary | ICD-10-CM | POA: Diagnosis present

## 2013-02-15 DIAGNOSIS — Z87442 Personal history of urinary calculi: Secondary | ICD-10-CM

## 2013-02-15 DIAGNOSIS — Z9181 History of falling: Secondary | ICD-10-CM

## 2013-02-15 DIAGNOSIS — Z602 Problems related to living alone: Secondary | ICD-10-CM

## 2013-02-15 DIAGNOSIS — K5900 Constipation, unspecified: Secondary | ICD-10-CM | POA: Diagnosis present

## 2013-02-15 DIAGNOSIS — R609 Edema, unspecified: Secondary | ICD-10-CM | POA: Diagnosis present

## 2013-02-15 DIAGNOSIS — Z96659 Presence of unspecified artificial knee joint: Secondary | ICD-10-CM

## 2013-02-15 DIAGNOSIS — M47812 Spondylosis without myelopathy or radiculopathy, cervical region: Secondary | ICD-10-CM | POA: Diagnosis present

## 2013-02-15 DIAGNOSIS — G2581 Restless legs syndrome: Secondary | ICD-10-CM | POA: Diagnosis present

## 2013-02-15 DIAGNOSIS — M5020 Other cervical disc displacement, unspecified cervical region: Principal | ICD-10-CM | POA: Diagnosis present

## 2013-02-15 DIAGNOSIS — F3289 Other specified depressive episodes: Secondary | ICD-10-CM | POA: Diagnosis present

## 2013-02-15 DIAGNOSIS — Z9089 Acquired absence of other organs: Secondary | ICD-10-CM

## 2013-02-15 HISTORY — PX: ANTERIOR CERVICAL DECOMP/DISCECTOMY FUSION: SHX1161

## 2013-02-15 LAB — GLUCOSE, CAPILLARY
Glucose-Capillary: 110 mg/dL — ABNORMAL HIGH (ref 70–99)
Glucose-Capillary: 128 mg/dL — ABNORMAL HIGH (ref 70–99)
Glucose-Capillary: 184 mg/dL — ABNORMAL HIGH (ref 70–99)
Glucose-Capillary: 185 mg/dL — ABNORMAL HIGH (ref 70–99)

## 2013-02-15 SURGERY — ANTERIOR CERVICAL DECOMPRESSION/DISCECTOMY FUSION 1 LEVEL
Anesthesia: General | Site: Spine Cervical | Wound class: Clean

## 2013-02-15 MED ORDER — DEXAMETHASONE 4 MG PO TABS
4.0000 mg | ORAL_TABLET | Freq: Four times a day (QID) | ORAL | Status: AC
  Administered 2013-02-16 (×2): 4 mg via ORAL
  Filled 2013-02-15 (×2): qty 1

## 2013-02-15 MED ORDER — FENTANYL CITRATE 0.05 MG/ML IJ SOLN
25.0000 ug | INTRAMUSCULAR | Status: DC | PRN
  Administered 2013-02-15: 25 ug via INTRAVENOUS

## 2013-02-15 MED ORDER — METOCLOPRAMIDE HCL 5 MG/ML IJ SOLN: 10.0000 mg | Freq: Once | INTRAMUSCULAR | Status: DC | PRN

## 2013-02-15 MED ORDER — LIDOCAINE HCL (CARDIAC) 20 MG/ML IV SOLN
INTRAVENOUS | Status: DC | PRN
  Administered 2013-02-15: 60 mg via INTRAVENOUS

## 2013-02-15 MED ORDER — FENTANYL CITRATE 0.05 MG/ML IJ SOLN
INTRAMUSCULAR | Status: DC | PRN
  Administered 2013-02-15 (×4): 50 ug via INTRAVENOUS

## 2013-02-15 MED ORDER — DIAZEPAM 5 MG PO TABS
5.0000 mg | ORAL_TABLET | Freq: Four times a day (QID) | ORAL | Status: DC | PRN
  Administered 2013-02-15 – 2013-02-17 (×7): 5 mg via ORAL
  Filled 2013-02-15 (×5): qty 1

## 2013-02-15 MED ORDER — ACETAMINOPHEN 650 MG RE SUPP: 650.0000 mg | RECTAL | Status: DC | PRN

## 2013-02-15 MED ORDER — LACTATED RINGERS IV SOLN
INTRAVENOUS | Status: DC | PRN
  Administered 2013-02-15 (×2): via INTRAVENOUS

## 2013-02-15 MED ORDER — HYDROCHLOROTHIAZIDE 25 MG PO TABS
25.0000 mg | ORAL_TABLET | Freq: Every day | ORAL | Status: DC
  Administered 2013-02-15 – 2013-02-18 (×4): 25 mg via ORAL
  Filled 2013-02-15 (×4): qty 1

## 2013-02-15 MED ORDER — LACTATED RINGERS IV SOLN: INTRAVENOUS | Status: DC

## 2013-02-15 MED ORDER — THROMBIN 5000 UNITS EX SOLR
CUTANEOUS | Status: DC | PRN
  Administered 2013-02-15 (×2): 5000 [IU] via TOPICAL

## 2013-02-15 MED ORDER — METFORMIN HCL ER 500 MG PO TB24
500.0000 mg | ORAL_TABLET | Freq: Every day | ORAL | Status: DC
  Administered 2013-02-16 – 2013-02-18 (×3): 500 mg via ORAL
  Filled 2013-02-15 (×4): qty 1

## 2013-02-15 MED ORDER — ACETAMINOPHEN 325 MG PO TABS: 650.0000 mg | ORAL_TABLET | ORAL | Status: DC | PRN

## 2013-02-15 MED ORDER — DOCUSATE SODIUM 100 MG PO CAPS
100.0000 mg | ORAL_CAPSULE | Freq: Two times a day (BID) | ORAL | Status: DC
  Administered 2013-02-15 – 2013-02-18 (×6): 100 mg via ORAL
  Filled 2013-02-15 (×6): qty 1

## 2013-02-15 MED ORDER — MORPHINE SULFATE 2 MG/ML IJ SOLN
1.0000 mg | INTRAMUSCULAR | Status: DC | PRN
  Administered 2013-02-16 (×2): 4 mg via INTRAVENOUS
  Filled 2013-02-15 (×2): qty 2

## 2013-02-15 MED ORDER — BUPIVACAINE-EPINEPHRINE PF 0.5-1:200000 % IJ SOLN
INTRAMUSCULAR | Status: DC | PRN
  Administered 2013-02-15: 10 mL

## 2013-02-15 MED ORDER — FENTANYL CITRATE 0.05 MG/ML IJ SOLN
INTRAMUSCULAR | Status: AC
  Filled 2013-02-15: qty 2

## 2013-02-15 MED ORDER — HYDROCODONE-ACETAMINOPHEN 5-325 MG PO TABS
1.0000 | ORAL_TABLET | ORAL | Status: DC | PRN
  Administered 2013-02-15 – 2013-02-17 (×11): 2 via ORAL
  Filled 2013-02-15 (×11): qty 2

## 2013-02-15 MED ORDER — PROPOFOL 10 MG/ML IV BOLUS
INTRAVENOUS | Status: DC | PRN
  Administered 2013-02-15: 180 mg via INTRAVENOUS

## 2013-02-15 MED ORDER — GLYCOPYRROLATE 0.2 MG/ML IJ SOLN
INTRAMUSCULAR | Status: DC | PRN
  Administered 2013-02-15: .7 mg via INTRAVENOUS

## 2013-02-15 MED ORDER — ROCURONIUM BROMIDE 100 MG/10ML IV SOLN
INTRAVENOUS | Status: DC | PRN
  Administered 2013-02-15: 50 mg via INTRAVENOUS

## 2013-02-15 MED ORDER — IRBESARTAN 300 MG PO TABS
300.0000 mg | ORAL_TABLET | Freq: Every day | ORAL | Status: DC
  Administered 2013-02-15 – 2013-02-18 (×4): 300 mg via ORAL
  Filled 2013-02-15 (×4): qty 1

## 2013-02-15 MED ORDER — FUROSEMIDE 20 MG PO TABS
20.0000 mg | ORAL_TABLET | Freq: Every morning | ORAL | Status: DC
  Administered 2013-02-15 – 2013-02-18 (×4): 20 mg via ORAL
  Filled 2013-02-15 (×4): qty 1

## 2013-02-15 MED ORDER — MENTHOL 3 MG MT LOZG: 1.0000 | LOZENGE | OROMUCOSAL | Status: DC | PRN

## 2013-02-15 MED ORDER — CEFAZOLIN SODIUM-DEXTROSE 2-3 GM-% IV SOLR
2.0000 g | Freq: Three times a day (TID) | INTRAVENOUS | Status: AC
  Administered 2013-02-15 – 2013-02-16 (×2): 2 g via INTRAVENOUS
  Filled 2013-02-15 (×4): qty 50

## 2013-02-15 MED ORDER — DEXAMETHASONE SODIUM PHOSPHATE 4 MG/ML IJ SOLN
4.0000 mg | Freq: Four times a day (QID) | INTRAMUSCULAR | Status: AC
  Administered 2013-02-15: 4 mg via INTRAVENOUS
  Filled 2013-02-15: qty 1

## 2013-02-15 MED ORDER — HEMOSTATIC AGENTS (NO CHARGE) OPTIME
TOPICAL | Status: DC | PRN
  Administered 2013-02-15: 1 via TOPICAL

## 2013-02-15 MED ORDER — VENLAFAXINE HCL ER 75 MG PO CP24
75.0000 mg | ORAL_CAPSULE | Freq: Every day | ORAL | Status: DC
  Administered 2013-02-16 – 2013-02-18 (×3): 75 mg via ORAL
  Filled 2013-02-15 (×4): qty 1

## 2013-02-15 MED ORDER — ZOLPIDEM TARTRATE 5 MG PO TABS: 5.0000 mg | ORAL_TABLET | Freq: Every evening | ORAL | Status: DC | PRN

## 2013-02-15 MED ORDER — PHENOL 1.4 % MT LIQD: 1.0000 | OROMUCOSAL | Status: DC | PRN

## 2013-02-15 MED ORDER — BACITRACIN ZINC 500 UNIT/GM EX OINT
TOPICAL_OINTMENT | CUTANEOUS | Status: DC | PRN
  Administered 2013-02-15: 1 via TOPICAL

## 2013-02-15 MED ORDER — INSULIN ASPART 100 UNIT/ML ~~LOC~~ SOLN
0.0000 [IU] | SUBCUTANEOUS | Status: DC
  Administered 2013-02-15: 4 [IU] via SUBCUTANEOUS
  Administered 2013-02-16 (×4): 3 [IU] via SUBCUTANEOUS

## 2013-02-15 MED ORDER — 0.9 % SODIUM CHLORIDE (POUR BTL) OPTIME
TOPICAL | Status: DC | PRN
  Administered 2013-02-15: 1000 mL

## 2013-02-15 MED ORDER — TELMISARTAN-HCTZ 80-25 MG PO TABS: 1.0000 | ORAL_TABLET | Freq: Every day | ORAL | Status: DC

## 2013-02-15 MED ORDER — ALUM & MAG HYDROXIDE-SIMETH 200-200-20 MG/5ML PO SUSP: 30.0000 mL | Freq: Four times a day (QID) | ORAL | Status: DC | PRN

## 2013-02-15 MED ORDER — LACTATED RINGERS IV SOLN
INTRAVENOUS | Status: DC
  Administered 2013-02-15: 08:00:00 via INTRAVENOUS

## 2013-02-15 MED ORDER — ATORVASTATIN CALCIUM 80 MG PO TABS
80.0000 mg | ORAL_TABLET | Freq: Every day | ORAL | Status: DC
  Administered 2013-02-15 – 2013-02-17 (×3): 80 mg via ORAL
  Filled 2013-02-15 (×4): qty 1

## 2013-02-15 MED ORDER — MIDAZOLAM HCL 5 MG/5ML IJ SOLN
INTRAMUSCULAR | Status: DC | PRN
  Administered 2013-02-15: 2 mg via INTRAVENOUS

## 2013-02-15 MED ORDER — ONDANSETRON HCL 4 MG/2ML IJ SOLN
INTRAMUSCULAR | Status: DC | PRN
  Administered 2013-02-15: 4 mg via INTRAVENOUS

## 2013-02-15 MED ORDER — DEXAMETHASONE SODIUM PHOSPHATE 4 MG/ML IJ SOLN
INTRAMUSCULAR | Status: DC | PRN
  Administered 2013-02-15: 8 mg via INTRAVENOUS

## 2013-02-15 MED ORDER — SODIUM CHLORIDE 0.9 % IV SOLN
10.0000 mg | INTRAVENOUS | Status: DC | PRN
  Administered 2013-02-15: 10 ug/min via INTRAVENOUS

## 2013-02-15 MED ORDER — NEOSTIGMINE METHYLSULFATE 1 MG/ML IJ SOLN
INTRAMUSCULAR | Status: DC | PRN
  Administered 2013-02-15: 4 mg via INTRAVENOUS

## 2013-02-15 MED ORDER — DIAZEPAM 5 MG PO TABS
ORAL_TABLET | ORAL | Status: AC
  Filled 2013-02-15: qty 1

## 2013-02-15 MED ORDER — SODIUM CHLORIDE 0.9 % IR SOLN
Status: DC | PRN
  Administered 2013-02-15: 11:00:00

## 2013-02-15 MED ORDER — ONDANSETRON HCL 4 MG/2ML IJ SOLN: 4.0000 mg | INTRAMUSCULAR | Status: DC | PRN

## 2013-02-15 SURGICAL SUPPLY — 61 items
BAG DECANTER FOR FLEXI CONT (MISCELLANEOUS) ×2 IMPLANT
BENZOIN TINCTURE PRP APPL 2/3 (GAUZE/BANDAGES/DRESSINGS) ×2 IMPLANT
BIT DRILL NEURO 2X3.1 SFT TUCH (MISCELLANEOUS) ×1 IMPLANT
BLADE SURG 15 STRL LF DISP TIS (BLADE) ×1 IMPLANT
BLADE SURG 15 STRL SS (BLADE) ×1
BLADE ULTRA TIP 2M (BLADE) ×2 IMPLANT
BRUSH SCRUB EZ PLAIN DRY (MISCELLANEOUS) ×2 IMPLANT
BUR BARREL STRAIGHT FLUTE 4.0 (BURR) ×2 IMPLANT
BUR MATCHSTICK NEURO 3.0 LAGG (BURR) ×2 IMPLANT
CANISTER SUCT 3000ML (MISCELLANEOUS) ×2 IMPLANT
CONT SPEC 4OZ CLIKSEAL STRL BL (MISCELLANEOUS) ×2 IMPLANT
COVER MAYO STAND STRL (DRAPES) ×2 IMPLANT
DRAPE LAPAROTOMY 100X72 PEDS (DRAPES) ×2 IMPLANT
DRAPE MICROSCOPE LEICA (MISCELLANEOUS) ×2 IMPLANT
DRAPE POUCH INSTRU U-SHP 10X18 (DRAPES) ×2 IMPLANT
DRAPE PROXIMA HALF (DRAPES) ×2 IMPLANT
DRAPE SURG 17X23 STRL (DRAPES) ×4 IMPLANT
DRILL NEURO 2X3.1 SOFT TOUCH (MISCELLANEOUS) ×2
ELECT BLADE 4.0 EZ CLEAN MEGAD (MISCELLANEOUS) ×2
ELECT REM PT RETURN 9FT ADLT (ELECTROSURGICAL) ×2
ELECTRODE BLDE 4.0 EZ CLN MEGD (MISCELLANEOUS) ×1 IMPLANT
ELECTRODE REM PT RTRN 9FT ADLT (ELECTROSURGICAL) ×1 IMPLANT
GAUZE SPONGE 4X4 16PLY XRAY LF (GAUZE/BANDAGES/DRESSINGS) ×2 IMPLANT
GLOVE BIO SURGEON STRL SZ8.5 (GLOVE) ×2 IMPLANT
GLOVE BIOGEL PI IND STRL 7.5 (GLOVE) ×2 IMPLANT
GLOVE BIOGEL PI INDICATOR 7.5 (GLOVE) ×2
GLOVE ECLIPSE 7.0 STRL STRAW (GLOVE) ×2 IMPLANT
GLOVE EXAM NITRILE LRG STRL (GLOVE) IMPLANT
GLOVE EXAM NITRILE MD LF STRL (GLOVE) IMPLANT
GLOVE EXAM NITRILE XL STR (GLOVE) IMPLANT
GLOVE EXAM NITRILE XS STR PU (GLOVE) IMPLANT
GLOVE SS BIOGEL STRL SZ 8 (GLOVE) ×1 IMPLANT
GLOVE SUPERSENSE BIOGEL SZ 8 (GLOVE) ×1
GLOVE SURG SS PI 7.0 STRL IVOR (GLOVE) ×4 IMPLANT
GOWN BRE IMP SLV AUR LG STRL (GOWN DISPOSABLE) ×2 IMPLANT
GOWN BRE IMP SLV AUR XL STRL (GOWN DISPOSABLE) ×4 IMPLANT
KIT BASIN OR (CUSTOM PROCEDURE TRAY) ×2 IMPLANT
KIT ROOM TURNOVER OR (KITS) ×2 IMPLANT
MARKER SKIN DUAL TIP RULER LAB (MISCELLANEOUS) ×2 IMPLANT
NEEDLE HYPO 22GX1.5 SAFETY (NEEDLE) ×2 IMPLANT
NEEDLE SPNL 18GX3.5 QUINCKE PK (NEEDLE) ×2 IMPLANT
NS IRRIG 1000ML POUR BTL (IV SOLUTION) ×2 IMPLANT
PACK LAMINECTOMY NEURO (CUSTOM PROCEDURE TRAY) ×2 IMPLANT
PIN DISTRACTION 14MM (PIN) ×4 IMPLANT
PLATE ANT CERV XTEND 1 LV 14 (Plate) ×2 IMPLANT
PUTTY 2.5ML ACTIFUSE ABX (Putty) ×2 IMPLANT
RUBBERBAND STERILE (MISCELLANEOUS) ×4 IMPLANT
SCREW XTD VAR 4.2 SELF TAP 12 (Screw) ×8 IMPLANT
SPONGE GAUZE 4X4 12PLY (GAUZE/BANDAGES/DRESSINGS) ×2 IMPLANT
SPONGE INTESTINAL PEANUT (DISPOSABLE) ×8 IMPLANT
SPONGE SURGIFOAM ABS GEL SZ50 (HEMOSTASIS) ×2 IMPLANT
STRIP CLOSURE SKIN 1/2X4 (GAUZE/BANDAGES/DRESSINGS) ×2 IMPLANT
SUT VIC AB 0 CT1 27 (SUTURE) ×1
SUT VIC AB 0 CT1 27XBRD ANTBC (SUTURE) ×1 IMPLANT
SUT VIC AB 3-0 SH 8-18 (SUTURE) ×2 IMPLANT
SYR 20ML ECCENTRIC (SYRINGE) ×2 IMPLANT
TAPE CLOTH SURG 4X10 WHT LF (GAUZE/BANDAGES/DRESSINGS) ×2 IMPLANT
TOWEL OR 17X24 6PK STRL BLUE (TOWEL DISPOSABLE) ×2 IMPLANT
TOWEL OR 17X26 10 PK STRL BLUE (TOWEL DISPOSABLE) ×2 IMPLANT
VISTA S 14X14X7 (Spacer) ×2 IMPLANT
WATER STERILE IRR 1000ML POUR (IV SOLUTION) ×2 IMPLANT

## 2013-02-15 NOTE — Progress Notes (Signed)
UR COMPLETED  

## 2013-02-15 NOTE — Anesthesia Procedure Notes (Signed)
Procedure Name: Intubation Date/Time: 02/15/2013 10:19 AM Performed by: Armandina Gemma Pre-anesthesia Checklist: Patient identified, Timeout performed, Emergency Drugs available, Suction available and Patient being monitored Patient Re-evaluated:Patient Re-evaluated prior to inductionOxygen Delivery Method: Circle system utilized Preoxygenation: Pre-oxygenation with 100% oxygen Intubation Type: IV induction Ventilation: Mask ventilation throughout procedure Grade View: Grade I Tube type: Oral Tube size: 7.0 mm Number of attempts: 1 Airway Equipment and Method: Stylet and Video-laryngoscopy Placement Confirmation: ETT inserted through vocal cords under direct vision,  breath sounds checked- equal and bilateral and positive ETCO2 Secured at: 22 cm Tube secured with: Tape Dental Injury: Teeth and Oropharynx as per pre-operative assessment  Comments: IV induction Fredrick- Glidescope intubation AM CRNA- atraumatic - teeth and mouth as preop

## 2013-02-15 NOTE — Preoperative (Signed)
Beta Blockers   Reason not to administer Beta Blockers:Not Applicable 

## 2013-02-15 NOTE — Op Note (Signed)
Brief history: The patient is a 72 year old white female who I previously performed anterior cervical discectomy fusion and plating. She has developed recurrent neck and shoulder pain. She has failed medical management was worked up with a cervical MRI. This demonstrated a herniated disc at C3-4. I discussed the various treatment options with the patient including surgery. She has weighed the risks, benefits, and alternatives surgery and decided proceed with C3-4 anterior cervical discectomy, fusion, and plating.  Preoperative diagnosis: C3-4 herniated disc, cervical recovery, cervicalgia  Postoperative diagnosis: The same  Procedure: C3-4 Anterior cervical discectomy/decompression; C3-4 interbody arthrodesis with local morcellized autograft bone and Actifuse bone graft extender; insertion of interbody prosthesis at C3-4 (Zimmer peek interbody prosthesis); anterior cervical plating from C3-4 with globus titanium plate  Surgeon: Dr. Delma Officer  Asst.: Dr. Conni Elliot  Anesthesia: Gen. endotracheal  Estimated blood loss: 50 cc  Drains: None  Complications: None  Description of procedure: The patient was brought to the operating room by the anesthesia team. General endotracheal anesthesia was induced. A roll was placed under the patient's shoulders to keep the neck in the neutral position. The patient's anterior cervical region was then prepared with Betadine scrub and Betadine solution. Sterile drapes were applied.  The area to be incised was then injected with Marcaine with epinephrine solution. I then used a scalpel to make a transverse incision in the patient's left anterior neck, incising through the patient's previous surgical car. I used the Metzenbaum scissors to divide the platysmal muscle and then to dissect medial to the sternocleidomastoid muscle, jugular vein, and carotid artery, carefully dissecting through the scar tissue from the prior operations.. I carefully dissected down towards  the anterior cervical spine identifying the esophagus and retracting it medially. Then using Kitner swabs to clear soft tissue from the anterior cervical spine. We then inserted a bent spinal needle into the upper exposed intervertebral disc space. We then obtained intraoperative radiographs confirm our location.  I then used electrocautery to detach the medial border of the longus colli muscle bilaterally from the C3-4 intervertebral disc spaces. I then inserted the Caspar self-retaining retractor underneath the longus colli muscle bilaterally to provide exposure.  We then incised the intervertebral disc at C3-4. We then performed a partial intervertebral discectomy with a pituitary forceps and the Karlin curettes. I then inserted distraction screws into the vertebral bodies at C3 and C4. We then distracted the interspace. We then used the high-speed drill to decorticate the vertebral endplates at C3-4, to drill away the remainder of the intervertebral disc, to drill away some posterior spondylosis, and to thin out the posterior longitudinal ligament. I then incised ligament with the arachnoid knife. We then removed the ligament with a Kerrison punches undercutting the vertebral endplates and decompressing the thecal sac. We then performed foraminotomies about the bilateral C4 nerve roots. This completed the decompression at this level.   We now turned our to attention to the interbody fusion. We used the trial spacers to determine the appropriate size for the interbody prosthesis. We then pre-filled prosthesis with a combination of local morcellized autograft bone that we obtained during decompression as well as Actifuse bone graft extender. We then inserted the prosthesis into the distracted interspace at C3-4. We then removed the distraction screws. There was a good snug fit of the prosthesis in the interspace.   Having completed the fusion we now turned attention to the anterior spinal  instrumentation. We used the high-speed drill to drill away some anterior spondylosis at the disc  spaces so that the plate lay down flat. We selected the appropriate length titanium anterior cervical plate. We laid it along the anterior aspect of the vertebral bodies from C3-C4. We then drilled 12 mm holes at C3 and C4. We then secured the plate to the vertebral bodies by placing two 12 mm self-tapping screws at C3 and C4. We then obtained intraoperative radiograph. The demonstrating good position of the instrumentation. We therefore secured the screws the plate the locking each cam. This completed the instrumentation.  We then obtained hemostasis using bipolar electrocautery. We irrigated the wound out with bacitracin solution. We then removed the retractor. We inspected the esophagus for any damage. There was none apparent. We then reapproximated patient's platysmal muscle with interrupted 3-0 Vicryl suture. We then reapproximated the subcutaneous tissue with interrupted 3-0 Vicryl suture. The skin was reapproximated with Steri-Strips and benzoin. The wound was then covered with bacitracin ointment. A sterile dressing was applied. The drapes were removed. Patient was subsequently extubated by the anesthesia team and transported to the post anesthesia care unit in stable condition. All sponge instrument and needle counts were reportedly correct at the end of this case.

## 2013-02-15 NOTE — Plan of Care (Signed)
Problem: Consults Goal: Diagnosis - Spinal Surgery Outcome: Completed/Met Date Met:  02/15/13 Cervical Spine Fusion     

## 2013-02-15 NOTE — H&P (Signed)
Subjective: The patient is a 72 year old white female who's had some chronic neck problems. Is undergone prior anterior cervical discectomy, fusion, and plating. She has developed recurrent neck and shoulder pain consistent with a cervical radiculopathy. She has failed medical management was worked up with cervical MRI. This demonstrated a herniated disc at C3-4. I discussed the various treatment options with the patient including surgery. She has weighed the risks, benefits, and alternatives surgery and decided proceed with a C3-4 anterior cervical discectomy, fusion, and plating.   Past Medical History  Diagnosis Date  . Arthritis   . Back pain, chronic   . Macular degeneration of both eyes     eye injections Q6-7wks for wet mac degeneration;sees [name]  . Falls frequently fell 12-17-2012     neurological workup inconclusive per pt  . Hyperlipidemia     takes Lipitor daily  . Peripheral vascular disease   . History of kidney stones   . Cancer   . Uterine cancer dx'd 1991    surg only  . Hypertension     takes Micardis daily  . Blind     in right eye  . History of bronchitis     long time ago  . Weakness     right  . Tingling     to right arm  . Peripheral neuropathy   . Joint pain   . History of gout     no meds required  . Vitamin D deficiency     takes Vit D daily  . GERD (gastroesophageal reflux disease)     was on Nexium but once gallbladder removed symptoms improved  . Constipation     takes Sennoside nightly  . History of kidney stones   . Nocturia   . Peripheral edema     takes Lasix daily  . Anemia   . Diabetes mellitus     takes Metformin daily  . Depression     takes Effexor daily  . Restless leg     takes Valium nighly  . History of MRSA infection 2011    Past Surgical History  Procedure Laterality Date  . Foot surgery      left toe  . Nose surgery      for fx  . Hemorrhoid surgery  yrs ago  . Esophageal manometry  02/24/2012    Procedure:  ESOPHAGEAL MANOMETRY (EM);  Surgeon: Christia Reading, MD;  Location: [facility];  Service: Endoscopy;  Laterality: N/A;  without impedience  . 24 hour ph study  02/24/2012    Procedure: 24 HOUR PH STUDY;  Surgeon: Christia Reading, MD;  Location: [facility];  Service: Endoscopy;  Laterality: N/A;  . 24 hour ph study  03/16/2012    Procedure: 24 HOUR PH STUDY;  Surgeon: Christia Reading, MD;  Location: [facility];  Service: Endoscopy;  Laterality: N/A;  Debbora Presto will credit the patient for Test on 11/11 and rebill for this date 03/16/12 Everrett Coombe AD   . Esophagogastroduodenoscopy  05/08/2012    Procedure: ESOPHAGOGASTRODUODENOSCOPY (EGD);  Surgeon: Theda Belfast, MD;  Location: [facility];  Service: Endoscopy;  Laterality: N/A;  . Botox injection  05/08/2012    Procedure: BOTOX INJECTION;  Surgeon: Theda Belfast, MD;  Location: [facility];  Service: Endoscopy;  Laterality: N/A;  . Cervical spine surgery  nov 2011  and 2013    x 2, trouble turning neck to right  . Eye surgery      R eye   . Abdominal  hysterectomy  1991    complete  . Joint replacement  8 yrs ago    rt knee  . Foot surgery Left     1st joint to the second toe is removed  . Cholecystectomy N/A 12/22/2012    Procedure: LAPAROSCOPIC CHOLECYSTECTOMY WITH INTRAOPERATIVE CHOLANGIOGRAM;  Surgeon: Valarie Merino, MD;  Location: [facility];  Service: General;  Laterality: N/A;  . Back surgery  2002, 2003, 2006    x 4  . Colonoscopy    . Cystoscopy    . Bilateral cataract surgery      Allergies  Allergen Reactions  . Banana Nausea And Vomiting  . Codeine Nausea And Vomiting  . Dilaudid [Hydromorphone] Other (See Comments)    Altered mental status  . Oxycodone Hcl Nausea And Vomiting  . Sausage [Pickled Meat] Nausea And Vomiting    History  Substance Use Topics  . Smoking status: Former Smoker -- 1.00 packs/day for 30 years    Types: Cigarettes  . Smokeless tobacco: Never Used     Comment: quit about 45yrs ago  .  Alcohol Use: No    Family History  Problem Relation Age of Onset  . Cancer Mother     colon / uterus  . Stroke Mother   . Stroke Father    Prior to Admission medications   Medication Sig Start Date End Date Taking? Authorizing Provider  Ascorbic Acid (VITAMIN C) 1000 MG tablet Take 1,000 mg by mouth daily.   Yes Historical Provider, MD  aspirin 325 MG EC tablet Take 325 mg by mouth every morning.   Yes Historical Provider, MD  atorvastatin (LIPITOR) 80 MG tablet Take 80 mg by mouth at bedtime.    Yes Historical Provider, MD  Calcium Carbonate-Vitamin D (CALTRATE 600+D PO) Take 1 tablet by mouth every morning.    Yes Historical Provider, MD  cholecalciferol (VITAMIN D) 1000 UNITS tablet Take 1,000 Units by mouth daily.   Yes Historical Provider, MD  diazepam (VALIUM) 5 MG tablet Take 5 mg by mouth at bedtime.    Yes Historical Provider, MD  fish oil-omega-3 fatty acids 1000 MG capsule Take 1 g by mouth every morning.    Yes Historical Provider, MD  folic acid (FOLVITE) 400 MCG tablet Take 800 mcg by mouth every morning.    Yes Historical Provider, MD  furosemide (LASIX) 20 MG tablet Take 20 mg by mouth every morning.    Yes Historical Provider, MD  HYDROcodone-acetaminophen (NORCO) 10-325 MG per tablet Take 1 tablet by mouth every 6 (six) hours as needed for pain.    Yes Historical Provider, MD  MAGNESIUM-ZINC PO Take 1 tablet by mouth daily.   Yes Historical Provider, MD  metFORMIN (GLUCOPHAGE-XR) 500 MG 24 hr tablet Take 500 mg by mouth daily with breakfast.   Yes Historical Provider, MD  Multiple Vitamins-Minerals (VISION PLUS) CAPS Take 1 capsule by mouth daily.   Yes Historical Provider, MD  Polyethyl Glycol-Propyl Glycol (SYSTANE OP) Place 1 drop into both eyes 2 (two) times daily.   Yes Historical Provider, MD  Sennosides 25 MG TABS Take 50 mg by mouth at bedtime.   Yes Historical Provider, MD  telmisartan-hydrochlorothiazide (MICARDIS HCT) 80-25 MG per tablet Take 1 tablet by mouth  daily.   Yes Historical Provider, MD  Thiamine HCl (VITAMIN B-1) 250 MG tablet Take 250 mg by mouth every morning.   Yes Historical Provider, MD  venlafaxine (EFFEXOR-XR) 75 MG 24 hr capsule Take 75 mg by mouth daily with  breakfast.    Yes Historical Provider, MD  vitamin E 400 UNIT capsule Take 400 Units by mouth every morning.    Yes Historical Provider, MD  zinc gluconate 50 MG tablet Take 50 mg by mouth daily.   Yes Historical Provider, MD     Review of Systems  Positive ROS: As above  All other systems have been reviewed and were otherwise negative with the exception of those mentioned in the HPI and as above.  Objective: Vital signs in last 24 hours: Temp:  [97.7 F (36.5 C)] 97.7 F (36.5 C) (11/03 0641) Pulse Rate:  [68] 68 (11/03 0641) Resp:  [18] 18 (11/03 0641) BP: (132)/(51) 132/51 mmHg (11/03 0641) SpO2:  [96 %] 96 % (11/03 0641)  General Appearance: Alert, cooperative, no distress, appears stated age Head: Normocephalic, without obvious abnormality, atraumatic Eyes: PERRL, conjunctiva/corneas clear, EOM's intact, fundi benign, both eyes      Ears: Normal TM's and external ear canals, both ears Throat: Lips, mucosa, and tongue normal; teeth and gums normal Neck: Supple, symmetrical, trachea midline, no adenopathy; thyroid: No enlargement/tenderness/nodules; no carotid bruit or JVD. The patient's cervical incision is well-healed. Back: Symmetric, no curvature, ROM normal, no CVA tenderness. The patient's lumbar incision is well-healed. Lungs: Clear to auscultation bilaterally, respirations unlabored Heart: Regular rate and rhythm, S1 and S2 normal, no murmur, rub or gallop Abdomen: Soft, non-tender, bowel sounds active all four quadrants, no masses, no organomegaly Extremities: Extremities normal, atraumatic, no cyanosis or edema Pulses: 2+ and symmetric all extremities Skin: Skin color, texture, turgor normal, no rashes or lesions  NEUROLOGIC:   Mental status:  alert and oriented, no aphasia, good attention span, Fund of knowledge/ memory ok Motor Exam - grossly normal Sensory Exam - grossly normal Reflexes:  Coordination - grossly normal Gait - grossly normal Balance - grossly normal Cranial Nerves: I: smell Not tested  II: visual acuity  OS: Normal    OD: Normal   II: visual fields Full to confrontation  II: pupils Equal, round, reactive to light  III,VII: ptosis None  III,IV,VI: extraocular muscles  Full ROM  V: mastication Normal  V: facial light touch sensation  Normal  V,VII: corneal reflex  Present  VII: facial muscle function - upper  Normal  VII: facial muscle function - lower Normal  VIII: hearing Not tested  IX: soft palate elevation  Normal  IX,X: gag reflex Present  XI: trapezius strength  5/5  XI: sternocleidomastoid strength 5/5  XI: neck flexion strength  5/5  XII: tongue strength  Normal    Data Review Lab Results  Component Value Date   WBC 5.2 02/10/2013   HGB 12.1 02/10/2013   HCT 36.4 02/10/2013   MCV 98.1 02/10/2013   PLT 207 02/10/2013   Lab Results  Component Value Date   NA 141 02/10/2013   K 3.9 02/10/2013   CL 100 02/10/2013   CO2 31 02/10/2013   BUN 21 02/10/2013   CREATININE 0.90 02/10/2013   GLUCOSE 105* 02/10/2013   Lab Results  Component Value Date   INR 0.9 06/13/2008    Assessment/Plan: C3-4 herniated disc, cervicalgia, cervical radiculopathy: I discussed situation with the patient. I have reviewed her MR scan with her and pointed out the abnormalities. We have discussed the various treatment options including surgery. I described the surgical treatment option of a C3-4 anterior cervical discectomy, fusion, and plating. I have shown her surgical models. We have discussed the risks, benefits, alternatives, and likelihood of achieving our  goals with surgery. I've answered all the patient's questions. She has decided proceed with surgery.   La Shehan D 02/15/2013 9:58 AM

## 2013-02-15 NOTE — Anesthesia Preprocedure Evaluation (Addendum)
Anesthesia Evaluation  Patient identified by MRN, date of birth, ID band Patient awake    Reviewed: Allergy & Precautions, H&P , NPO status , Patient's Chart, lab work & pertinent test results, reviewed documented beta blocker date and time   Airway Mallampati: II TM Distance: >3 FB Neck ROM: full    Dental  (+) Dental Advisory Given   Pulmonary neg pulmonary ROS,  breath sounds clear to auscultation        Cardiovascular hypertension, + Peripheral Vascular Disease Rhythm:regular     Neuro/Psych PSYCHIATRIC DISORDERS  Neuromuscular disease    GI/Hepatic Neg liver ROS, GERD-  Medicated and Controlled,  Endo/Other  diabetes, Oral Hypoglycemic Agents  Renal/GU negative Renal ROS  negative genitourinary   Musculoskeletal   Abdominal   Peds  Hematology  (+) anemia ,   Anesthesia Other Findings See surgeon's H&P   Reproductive/Obstetrics negative OB ROS                          Anesthesia Physical Anesthesia Plan  ASA: III  Anesthesia Plan: General   Post-op Pain Management:    Induction: Intravenous  Airway Management Planned: Oral ETT  Additional Equipment:   Intra-op Plan:   Post-operative Plan: Extubation in OR  Informed Consent: I have reviewed the patients History and Physical, chart, labs and discussed the procedure including the risks, benefits and alternatives for the proposed anesthesia with the patient or authorized representative who has indicated his/her understanding and acceptance.   Dental Advisory Given  Plan Discussed with: CRNA and Surgeon  Anesthesia Plan Comments:         Anesthesia Quick Evaluation

## 2013-02-15 NOTE — Transfer of Care (Signed)
Immediate Anesthesia Transfer of Care Note  Patient: Leslie Hendricks  Procedure(s) Performed: Procedure(s): CERVICAL THREE-FOUR ANTERIOR CERVICAL DECOMPRESSION WITH FUSION,INTERBODY PROSTHESIS,PLATING, AND BONEGRAFT (N/A)  Patient Location: PACU  Anesthesia Type:General  Level of Consciousness: sedated  Airway & Oxygen Therapy: Patient Spontanous Breathing and Patient connected to nasal cannula oxygen  Post-op Assessment: Report given to PACU RN and Post -op Vital signs reviewed and stable  Post vital signs: Reviewed and stable  Complications: No apparent anesthesia complications

## 2013-02-15 NOTE — Anesthesia Postprocedure Evaluation (Signed)
Anesthesia Post Note  Patient: Leslie Hendricks  Procedure(s) Performed: Procedure(s) (LRB): CERVICAL THREE-FOUR ANTERIOR CERVICAL DECOMPRESSION WITH FUSION,INTERBODY PROSTHESIS,PLATING, AND BONEGRAFT (N/A)  Anesthesia type: General  Patient location: PACU  Post pain: Pain level controlled  Post assessment: Patient's Cardiovascular Status Stable  Last Vitals:  Filed Vitals:   02/15/13 1337  BP:   Pulse: 61  Temp:   Resp: 21    Post vital signs: Reviewed and stable  Level of consciousness: alert  Complications: No apparent anesthesia complications

## 2013-02-15 NOTE — Progress Notes (Signed)
Patient ID: Theodoro Grist, female   DOB: 1940-04-26, 72 y.o.   MRN: [id number] Subjective:  The patient is somnolent but easily arousable. She looks well. She is in no apparent distress.  Objective: Vital signs in last 24 hours: Temp:  [97.3 F (36.3 C)-97.7 F (36.5 C)] 97.3 F (36.3 C) (11/03 1246) Pulse Rate:  [68-79] 79 (11/03 1251) Resp:  [18-29] 29 (11/03 1251) BP: (132-134)/(51-54) 134/54 mmHg (11/03 1251) SpO2:  [95 %-96 %] 95 % (11/03 1251)  Intake/Output from previous day:   Intake/Output this shift: Total I/O In: 1200 [I.V.:1200] Out: 50 [Blood:50]  Physical exam patient is somewhat arousable. She is moving all 4 extremities well.  Her dressing has a small bloodstain there is no evidence of hematoma or shift.  Lab Results: No results found for this basename: WBC, HGB, HCT, PLT,  in the last 72 hours BMET No results found for this basename: NA, K, CL, CO2, GLUCOSE, BUN, CREATININE, CALCIUM,  in the last 72 hours  Studies/Results: No results found.  Assessment/Plan: The patient is doing well.  LOS: 0 days     Sue Fernicola D 02/15/2013, 12:54 PM

## 2013-02-16 ENCOUNTER — Encounter (HOSPITAL_COMMUNITY): Payer: Self-pay | Admitting: Neurosurgery

## 2013-02-16 LAB — GLUCOSE, CAPILLARY
Glucose-Capillary: 132 mg/dL — ABNORMAL HIGH (ref 70–99)
Glucose-Capillary: 142 mg/dL — ABNORMAL HIGH (ref 70–99)
Glucose-Capillary: 142 mg/dL — ABNORMAL HIGH (ref 70–99)
Glucose-Capillary: 142 mg/dL — ABNORMAL HIGH (ref 70–99)
Glucose-Capillary: 142 mg/dL — ABNORMAL HIGH (ref 70–99)
Glucose-Capillary: 149 mg/dL — ABNORMAL HIGH (ref 70–99)

## 2013-02-16 MED ORDER — INSULIN ASPART 100 UNIT/ML ~~LOC~~ SOLN: 0.0000 [IU] | Freq: Every day | SUBCUTANEOUS | Status: DC

## 2013-02-16 MED ORDER — INSULIN ASPART 100 UNIT/ML ~~LOC~~ SOLN: 0.0000 [IU] | Freq: Three times a day (TID) | SUBCUTANEOUS | Status: DC

## 2013-02-16 NOTE — Progress Notes (Signed)
Patient ID: Leslie Hendricks, female   DOB: [birth year], 72 y.o.   MRN: [id number] Subjective:  The patient is alert and pleasant. Her neck is appropriately sore. She looks well. She does not feel she is ready to go home yet.  Objective: Vital signs in last 24 hours: Temp:  [97 F (36.1 C)-98.6 F (37 C)] 98.6 F (37 C) (11/04 0745) Pulse Rate:  [59-79] 72 (11/04 0745) Resp:  [14-29] 18 (11/04 0745) BP: (95-134)/(54-74) 95/54 mmHg (11/04 0745) SpO2:  [93 %-98 %] 94 % (11/04 0745)  Intake/Output from previous day: 11/03 0701 - 11/04 0700 In: 1440 [P.O.:240; I.V.:1200] Out: 50 [Blood:50] Intake/Output this shift:    Physical exam the patient is alert and pleasant. She is moving all 4 extremities well. Her dressing is clean and dry. There is no evidence of hematoma or shift.  Lab Results: No results found for this basename: WBC, HGB, HCT, PLT,  in the last 72 hours BMET No results found for this basename: NA, K, CL, CO2, GLUCOSE, BUN, CREATININE, CALCIUM,  in the last 72 hours  Studies/Results: Dg Cervical Spine 2-3 Views  02/15/2013   CLINICAL DATA:  Cervical disc protrusion.  EXAM: CERVICAL SPINE - 2-3 VIEW  COMPARISON:  MRI dated 01/27/2013  FINDINGS: Radiograph 1 demonstrates localization needle at C3-4. Image 2 demonstrates the patient has undergone new anterior fusion at C3-4. Anterior plate, screws and interbody plug appear in good position. Prior fusions at C4-5 and C5-6.  IMPRESSION: Anterior fusion performed at C3-4.   Electronically Signed   By: Geanie Cooley M.D.   On: 02/15/2013 14:52    Assessment/Plan: Postop day 1: We will mobilize the patient. She will likely go home tomorrow.  LOS: 1 day     Anahy Esh D 02/16/2013, 8:03 AM

## 2013-02-17 LAB — GLUCOSE, CAPILLARY
Glucose-Capillary: 118 mg/dL — ABNORMAL HIGH (ref 70–99)
Glucose-Capillary: 106 mg/dL — ABNORMAL HIGH (ref 70–99)
Glucose-Capillary: 76 mg/dL (ref 70–99)
Glucose-Capillary: 128 mg/dL — ABNORMAL HIGH (ref 70–99)

## 2013-02-17 NOTE — Evaluation (Signed)
Physical Therapy Evaluation Patient Details Name: Leslie Hendricks MRN: [id number] DOB: 10/14/1940 Today's Date: 02/17/2013 Time: 7829-5621 PT Time Calculation (min): 15 min  PT Assessment / Plan / Recommendation History of Present Illness  Pt underwent C3-4 Anterior cervical discectomy/decompression; C3-4 interbody arthrodesis with local morcellized autograft bone and Actifuse bone graft extender; insertion of interbody prosthesis at C3-4 (Zimmer peek interbody prosthesis); anterior cervical plating from C3-4 with globus titanium plate  Clinical Impression  Patient is s/p above surgery resulting in the deficits listed below (see PT Problem List).  Patient will benefit from skilled PT to increase their independence and safety with mobility (while adhering to their precautions) to allow discharge to ST-SNF.      PT Assessment  Patient needs continued PT services    Follow Up Recommendations  SNF    Does the patient have the potential to tolerate intense rehabilitation      Barriers to Discharge Decreased caregiver support      Equipment Recommendations  None recommended by PT    Recommendations for Other Services     Frequency Min 3X/week    Precautions / Restrictions Precautions Precautions: Fall;Cervical   Pertinent Vitals/Pain Neck pain.  Repositioned.      Mobility  Bed Mobility Bed Mobility: Supine to Sit;Sitting - Scoot to [place] of Bed;Sit to Sidelying Right Supine to Sit: 4: Min assist;HOB elevated Sitting - Scoot to [place] of Bed: 4: Min guard Sit to Sidelying Right: 4: Min assist;HOB elevated Details for Bed Mobility Assistance: Assist to bring trunk up into sitting and legs back up into bed. Transfers Transfers: Sit to Stand;Stand to Sit Sit to Stand: 4: Min assist;With upper extremity assist;From bed Stand to Sit: 4: Min assist;With upper extremity assist;To bed Details for Transfer Assistance: Verbal cues for hand placement and assist for  balance. Ambulation/Gait Ambulation/Gait Assistance: 4: Min assist Ambulation Distance (Feet): 90 Feet Assistive device: Rolling walker Ambulation/Gait Assistance Details: Staggering gait at times requiring assist to prevent fall.  Verbal cues to stay closer to walker. Gait Pattern: Step-through pattern;Decreased step length - right;Decreased step length - left;Shuffle;Trunk flexed Gait velocity: decr    Exercises     PT Diagnosis: Difficulty walking;Generalized weakness  PT Problem List: Decreased strength;Decreased balance;Decreased mobility;Decreased knowledge of use of DME PT Treatment Interventions: DME instruction;Gait training;Functional mobility training;Therapeutic activities;Therapeutic exercise;Balance training;Patient/family education     PT Goals(Current goals can be found in the care plan section) Acute Rehab PT Goals Patient Stated Goal: Get more independent prior to return home. PT Goal Formulation: With patient Time For Goal Achievement: 02/24/13 Potential to Achieve Goals: Good  Visit Information  Last PT Received On: 02/17/13 Assistance Needed: +1 History of Present Illness: Pt underwent C3-4 Anterior cervical discectomy/decompression; C3-4 interbody arthrodesis with local morcellized autograft bone and Actifuse bone graft extender; insertion of interbody prosthesis at C3-4 (Zimmer peek interbody prosthesis); anterior cervical plating from C3-4 with globus titanium plate       Prior Functioning  Home Living Family/patient expects to be discharged to:: Skilled nursing facility Living Arrangements: Alone Type of Home: House Home Access: Ramped entrance Home Layout: One level Home Equipment: Bedside commode;Shower seat;Walker - 2 wheels;Hospital bed Prior Function Level of Independence: Independent with assistive device(s) Comments: Frequent falls at home. Communication Communication: No difficulties Dominant Hand: Right    Cognition   Cognition Arousal/Alertness: Awake/alert Behavior During Therapy: WFL for tasks assessed/performed Overall Cognitive Status: Within Functional Limits for tasks assessed    Extremity/Trunk Assessment Upper Extremity Assessment Upper Extremity  Assessment: Generalized weakness Lower Extremity Assessment Lower Extremity Assessment: Generalized weakness   Balance Balance Balance Assessed: Yes Static Standing Balance Static Standing - Balance Support: Bilateral upper extremity supported Static Standing - Level of Assistance: 4: Min assist  End of Session PT - End of Session Equipment Utilized During Treatment: Gait belt;Cervical collar Activity Tolerance: Patient tolerated treatment well Patient left: in bed;with call bell/phone within reach Nurse Communication: Mobility status  GP     Leslie Hendricks 02/17/2013, 3:07 PM  [employer] PT [phone]

## 2013-02-17 NOTE — Progress Notes (Signed)
Patient ID: Leslie Hendricks, female   DOB: 1940-08-10, 72 y.o.   MRN: [id number] Subjective:  The patient is alert and pleasant. She looks well. She does not feel she is ready to go home and wants to go into rehabilitation/nursing facility.  Objective: Vital signs in last 24 hours: Temp:  [97.7 F (36.5 C)-98.4 F (36.9 C)] 97.7 F (36.5 C) (11/05 0819) Pulse Rate:  [66-69] 68 (11/05 0819) Resp:  [16-18] 18 (11/05 0819) BP: (99-124)/(53-71) 110/53 mmHg (11/05 0819) SpO2:  [92 %-93 %] 92 % (11/05 0819)  Intake/Output from previous day: 11/04 0701 - 11/05 0700 In: 720 [P.O.:720] Out: -  Intake/Output this shift:    Physical exam the patient is alert and oriented. She looks well. Her dressing is clean and dry. There is no evidence of hematoma or shift. Her strength is normal.  Lab Results: No results found for this basename: WBC, HGB, HCT, PLT,  in the last 72 hours BMET No results found for this basename: NA, K, CL, CO2, GLUCOSE, BUN, CREATININE, CALCIUM,  in the last 72 hours  Studies/Results: Dg Cervical Spine 2-3 Views  02/15/2013   CLINICAL DATA:  Cervical disc protrusion.  EXAM: CERVICAL SPINE - 2-3 VIEW  COMPARISON:  MRI dated 01/27/2013  FINDINGS: Radiograph 1 demonstrates localization needle at C3-4. Image 2 demonstrates the patient has undergone new anterior fusion at C3-4. Anterior plate, screws and interbody plug appear in good position. Prior fusions at C4-5 and C5-6.  IMPRESSION: Anterior fusion performed at C3-4.   Electronically Signed   By: Geanie Cooley M.D.   On: 02/15/2013 14:52    Assessment/Plan: Postop day #2: The patient is making progress. We will await skilled nursing facility/rehabilitation placement.  LOS: 2 days     Merrill Deanda D 02/17/2013, 10:33 AM

## 2013-02-18 LAB — GLUCOSE, CAPILLARY
Glucose-Capillary: 83 mg/dL (ref 70–99)
Glucose-Capillary: 89 mg/dL (ref 70–99)

## 2013-02-18 MED ORDER — DSS 100 MG PO CAPS: 100.0000 mg | ORAL_CAPSULE | Freq: Two times a day (BID) | ORAL | Status: DC

## 2013-02-18 MED ORDER — DIAZEPAM 5 MG PO TABS: 5.0000 mg | ORAL_TABLET | Freq: Four times a day (QID) | ORAL | Status: DC | PRN

## 2013-02-18 MED ORDER — HYDROCODONE-ACETAMINOPHEN 10-325 MG PO TABS: 1.0000 | ORAL_TABLET | ORAL | Status: DC | PRN

## 2013-02-18 NOTE — Progress Notes (Signed)
Patient has a bed at [facility].  Sabino Niemann, MSW, [employer] [phone]

## 2013-02-18 NOTE — Progress Notes (Signed)
Clinical social worker assisted with patient discharge to skilled nursing facility, [facility].  CSW addressed all family questions and concerns. CSW copied chart and added all important documents. CSW also set up patient transportation with [facility]. Clinical Social Worker will sign off for now as social work intervention is no longer needed.   Gabriela Irigoyen, MSW, LCSWA [phone] 

## 2013-02-18 NOTE — Discharge Summary (Signed)
Physician Discharge Summary  Patient ID: Leslie Hendricks MRN: [id number] DOB/AGE: 07-30-1940 72 y.o.  Admit date: 02/15/2013 Discharge date: 02/18/2013  Admission Diagnoses: C3-4 herniated disc, cervicalgia, cervical radiculopathy  Discharge Diagnoses: The same Active Problems:   * No active hospital problems. *   Discharged Condition: good  Hospital Course: I performed a C3-4 anterior cervical discectomy, fusion, and plating on the patient at [facility] [place] on 02/15/2013. The surgery went well. The patient's postoperative course was unremarkable.  The patient lives alone and did not feel she was able to go home. Arrangements were made for transfer to a skilled nursing facility. [facility] Patient was given oral and written discharge instructions. All her questions were answered.  Consults: None Significant Diagnostic Studies: None Treatments: C3-4 anterior cervical discectomy, fusion, and plating. Discharge Exam: Blood pressure 149/67, pulse 62, temperature 98.7 F (37.1 C), temperature source Oral, resp. rate 18, SpO2 95.00%. Patient is alert and pleasant. Her strength is normal. Her incision is healing well without signs of infection. There is no evidence of hematoma or shift.  [facility]     Medication List         aspirin 325 MG EC tablet  Take 325 mg by mouth every morning.     atorvastatin 80 MG tablet  Commonly known as:  LIPITOR  Take 80 mg by mouth at bedtime.     CALTRATE 600+D PO  Take 1 tablet by mouth every morning.     cholecalciferol 1000 UNITS tablet  Commonly known as:  VITAMIN D  Take 1,000 Units by mouth daily.     diazepam 5 MG tablet  Commonly known as:  VALIUM  Take 1 tablet (5 mg total) by mouth every 6 (six) hours as needed for muscle spasms.     diazepam 5 MG tablet  Commonly known as:  VALIUM  Take 5 mg by mouth at bedtime.      DSS 100 MG Caps  Take 100 mg by mouth 2 (two) times daily.     fish oil-omega-3 fatty acids 1000 MG capsule  Take 1 g by mouth every morning.     folic acid 400 MCG tablet  Commonly known as:  FOLVITE  Take 800 mcg by mouth every morning.     furosemide 20 MG tablet  Commonly known as:  LASIX  Take 20 mg by mouth every morning.     HYDROcodone-acetaminophen 10-325 MG per tablet  Commonly known as:  NORCO  Take 1 tablet by mouth every 6 (six) hours as needed for pain.     HYDROcodone-acetaminophen 10-325 MG per tablet  Commonly known as:  NORCO  Take 1 tablet by mouth every 4 (four) hours as needed.     MAGNESIUM-ZINC PO  Take 1 tablet by mouth daily.     metFORMIN 500 MG 24 hr tablet  Commonly known as:  GLUCOPHAGE-XR  Take 500 mg by mouth daily with breakfast.     Sennosides 25 MG Tabs  Take 50 mg by mouth at bedtime.     SYSTANE OP  Place 1 drop into both eyes 2 (two) times daily.     telmisartan-hydrochlorothiazide 80-25 MG per tablet  Commonly known as:  MICARDIS HCT  Take 1 tablet by mouth daily.     venlafaxine XR 75 MG 24 hr capsule  Commonly known as:  EFFEXOR-XR  Take 75 mg by mouth daily with breakfast.  VISION PLUS Caps  Take 1 capsule by mouth daily.     vitamin B-1 250 MG tablet  Take 250 mg by mouth every morning.     vitamin C 1000 MG tablet  Take 1,000 mg by mouth daily.     vitamin E 400 UNIT capsule  Take 400 Units by mouth every morning.     zinc gluconate 50 MG tablet  Take 50 mg by mouth daily.         SignedCristi Loron 02/18/2013, 3:48 PM

## 2013-02-18 NOTE — Progress Notes (Signed)
Physical Therapy Treatment Patient Details Name: Leslie Hendricks MRN: [id number] DOB: [birth year] Today's Date: 02/18/2013 Time: 4098-1191 PT Time Calculation (min): 25 min  PT Assessment / Plan / Recommendation  History of Present Illness Pt underwent C3-4 Anterior cervical discectomy/decompression; C3-4 interbody arthrodesis with local morcellized autograft bone and Actifuse bone graft extender; insertion of interbody prosthesis at C3-4 (Zimmer peek interbody prosthesis); anterior cervical plating from C3-4 with globus titanium plate   PT Comments   Slow progress.  Continues to need SNF.  Follow Up Recommendations  SNF     Does the patient have the potential to tolerate intense rehabilitation     Barriers to Discharge        Equipment Recommendations  None recommended by PT    Recommendations for Other Services    Frequency Min 3X/week   Progress towards PT Goals Progress towards PT goals: Progressing toward goals  Plan Current plan remains appropriate    Precautions / Restrictions Precautions Precautions: Fall;Cervical   Pertinent Vitals/Pain Neck pain.  Repositioned.    Mobility  Bed Mobility Sit to Sidelying Right: 4: Min assist;HOB elevated Details for Bed Mobility Assistance: Assist to brings legs up into bed Transfers Sit to Stand: 4: Min assist;With upper extremity assist;From bed;From toilet Stand to Sit: 4: Min assist;With upper extremity assist;To bed;To toilet Details for Transfer Assistance: Verbal cues for hand placement and assist for balance. Ambulation/Gait Ambulation/Gait Assistance: 4: Min assist Ambulation Distance (Feet): 90 Feet Assistive device: Rolling walker Ambulation/Gait Assistance Details: Staggering gait at times requiring assist to prevent fall. Verbal cues for hand placement on walker. Gait Pattern: Step-through pattern;Decreased step length - right;Decreased step length - left;Shuffle;Trunk flexed Gait velocity: decr    Exercises      PT Diagnosis:    PT Problem List:   PT Treatment Interventions:     PT Goals (current goals can now be found in the care plan section)    Visit Information  Last PT Received On: 02/18/13 Assistance Needed: +1 History of Present Illness: Pt underwent C3-4 Anterior cervical discectomy/decompression; C3-4 interbody arthrodesis with local morcellized autograft bone and Actifuse bone graft extender; insertion of interbody prosthesis at C3-4 (Zimmer peek interbody prosthesis); anterior cervical plating from C3-4 with globus titanium plate    Subjective Data      Cognition  Cognition Arousal/Alertness: Awake/alert Behavior During Therapy: WFL for tasks assessed/performed Overall Cognitive Status: Within Functional Limits for tasks assessed    Balance  Static Standing Balance Static Standing - Balance Support: Bilateral upper extremity supported Static Standing - Level of Assistance: 4: Min assist  End of Session PT - End of Session Equipment Utilized During Treatment: Gait belt;Cervical collar Activity Tolerance: Patient tolerated treatment well Patient left: in bed;with call bell/phone within reach Nurse Communication: Mobility status   GP     Leslie Hendricks 02/18/2013, 1:01 PM  [employer] PT [phone]

## 2013-02-18 NOTE — Clinical Social Work Placement (Addendum)
Clinical Social Work Department  CLINICAL SOCIAL WORK PLACEMENT NOTE  02/17/2013  Patient: Leslie Hendricks Account Number: [id number]  Admit date: 02/16/2013  Clinical Social Worker: Sabino Niemann, MSW, Theresia Majors [phone]  Date/time: 02/17/2013 04:00 PM  Clinical Social Work is seeking post-discharge placement for this patient at the following level of care: SKILLED NURSING (*CSW will update this form in Epic as items are completed)  02/17/2013 Patient/family provided with [facility] Department of Clinical Social Work's list of facilities offering this level of care within the geographic area requested by the patient (or if unable, by the patient's family).  02/17/2013 Patient/family informed of their freedom to choose among providers that offer the needed level of care, that participate in Medicare, Medicaid or managed care program needed by the patient, have an available bed and are willing to accept the patient.  02/17/2013 Patient/family informed of [facility]' ownership interest in [facility], as well as of the fact that they are under no obligation to receive care at this facility.  PASARR submitted to EDS on Pre-existing PASARR number received from [employer] on  FL2 transmitted to all facilities in geographic area requested by pt/family on 02/17/2013  FL2 transmitted to all facilities within larger geographic area on  Patient informed that his/her managed care company has contracts with or will negotiate with certain facilities, including the following:  Patient/family informed of bed offers received:  Patient chooses bed at [facility] Physician recommends and patient chooses bed at  Patient to be transferred to on 02/18/2013 Patient to be transferred to facility by [facility] The following physician request were entered in Epic:  Additional Comments:

## 2013-05-27 ENCOUNTER — Encounter (HOSPITAL_COMMUNITY): Payer: Self-pay | Admitting: Emergency Medicine

## 2013-05-27 ENCOUNTER — Emergency Department (HOSPITAL_COMMUNITY): Payer: No Typology Code available for payment source

## 2013-05-27 ENCOUNTER — Emergency Department (HOSPITAL_COMMUNITY)
Admission: EM | Admit: 2013-05-27 | Discharge: 2013-05-27 | Disposition: A | Discharge status: Home / Self Care | Payer: No Typology Code available for payment source | Attending: Emergency Medicine | Admitting: Emergency Medicine

## 2013-05-27 DIAGNOSIS — Y9389 Activity, other specified: Secondary | ICD-10-CM | POA: Diagnosis not present

## 2013-05-27 DIAGNOSIS — K5900 Constipation, unspecified: Secondary | ICD-10-CM | POA: Insufficient documentation

## 2013-05-27 DIAGNOSIS — M129 Arthropathy, unspecified: Secondary | ICD-10-CM | POA: Diagnosis not present

## 2013-05-27 DIAGNOSIS — Y92410 Unspecified street and highway as the place of occurrence of the external cause: Secondary | ICD-10-CM | POA: Diagnosis not present

## 2013-05-27 DIAGNOSIS — Z8542 Personal history of malignant neoplasm of other parts of uterus: Secondary | ICD-10-CM | POA: Diagnosis not present

## 2013-05-27 DIAGNOSIS — G8929 Other chronic pain: Secondary | ICD-10-CM | POA: Diagnosis not present

## 2013-05-27 DIAGNOSIS — S298XXA Other specified injuries of thorax, initial encounter: Secondary | ICD-10-CM | POA: Insufficient documentation

## 2013-05-27 DIAGNOSIS — Z8614 Personal history of Methicillin resistant Staphylococcus aureus infection: Secondary | ICD-10-CM | POA: Diagnosis not present

## 2013-05-27 DIAGNOSIS — M549 Dorsalgia, unspecified: Secondary | ICD-10-CM

## 2013-05-27 DIAGNOSIS — E669 Obesity, unspecified: Secondary | ICD-10-CM | POA: Insufficient documentation

## 2013-05-27 DIAGNOSIS — E785 Hyperlipidemia, unspecified: Secondary | ICD-10-CM | POA: Diagnosis not present

## 2013-05-27 DIAGNOSIS — H543 Unqualified visual loss, both eyes: Secondary | ICD-10-CM | POA: Diagnosis not present

## 2013-05-27 DIAGNOSIS — R911 Solitary pulmonary nodule: Secondary | ICD-10-CM

## 2013-05-27 DIAGNOSIS — M542 Cervicalgia: Secondary | ICD-10-CM

## 2013-05-27 DIAGNOSIS — E559 Vitamin D deficiency, unspecified: Secondary | ICD-10-CM | POA: Diagnosis not present

## 2013-05-27 DIAGNOSIS — Z8709 Personal history of other diseases of the respiratory system: Secondary | ICD-10-CM | POA: Diagnosis not present

## 2013-05-27 DIAGNOSIS — E119 Type 2 diabetes mellitus without complications: Secondary | ICD-10-CM | POA: Insufficient documentation

## 2013-05-27 DIAGNOSIS — K219 Gastro-esophageal reflux disease without esophagitis: Secondary | ICD-10-CM | POA: Insufficient documentation

## 2013-05-27 DIAGNOSIS — F3289 Other specified depressive episodes: Secondary | ICD-10-CM | POA: Insufficient documentation

## 2013-05-27 DIAGNOSIS — Z87891 Personal history of nicotine dependence: Secondary | ICD-10-CM | POA: Diagnosis not present

## 2013-05-27 DIAGNOSIS — F329 Major depressive disorder, single episode, unspecified: Secondary | ICD-10-CM | POA: Insufficient documentation

## 2013-05-27 DIAGNOSIS — R079 Chest pain, unspecified: Secondary | ICD-10-CM

## 2013-05-27 DIAGNOSIS — IMO0002 Reserved for concepts with insufficient information to code with codable children: Secondary | ICD-10-CM | POA: Insufficient documentation

## 2013-05-27 DIAGNOSIS — Z79899 Other long term (current) drug therapy: Secondary | ICD-10-CM | POA: Diagnosis not present

## 2013-05-27 DIAGNOSIS — Z87442 Personal history of urinary calculi: Secondary | ICD-10-CM | POA: Diagnosis not present

## 2013-05-27 DIAGNOSIS — S199XXA Unspecified injury of neck, initial encounter: Secondary | ICD-10-CM | POA: Diagnosis present

## 2013-05-27 DIAGNOSIS — I10 Essential (primary) hypertension: Secondary | ICD-10-CM | POA: Insufficient documentation

## 2013-05-27 DIAGNOSIS — Z7982 Long term (current) use of aspirin: Secondary | ICD-10-CM | POA: Diagnosis not present

## 2013-05-27 DIAGNOSIS — S0993XA Unspecified injury of face, initial encounter: Secondary | ICD-10-CM | POA: Diagnosis present

## 2013-05-27 DIAGNOSIS — Z9181 History of falling: Secondary | ICD-10-CM | POA: Insufficient documentation

## 2013-05-27 DIAGNOSIS — D649 Anemia, unspecified: Secondary | ICD-10-CM | POA: Diagnosis not present

## 2013-05-27 LAB — POCT I-STAT, CHEM 8
BUN: 16 mg/dL (ref 6–23)
Calcium, Ion: 1.27 mmol/L (ref 1.13–1.30)
Chloride: 100 mEq/L (ref 96–112)
Creatinine, Ser: 1.1 mg/dL (ref 0.50–1.10)
Glucose, Bld: 104 mg/dL — ABNORMAL HIGH (ref 70–99)
HCT: 38 % (ref 36.0–46.0)
Hemoglobin: 12.9 g/dL (ref 12.0–15.0)
Potassium: 3.8 mEq/L (ref 3.7–5.3)
Sodium: 143 mEq/L (ref 137–147)
TCO2: 32 mmol/L (ref 0–100)

## 2013-05-27 LAB — CBC WITH DIFFERENTIAL/PLATELET
Basophils Absolute: 0 10*3/uL (ref 0.0–0.1)
Basophils Relative: 1 % (ref 0–1)
Eosinophils Absolute: 0.1 10*3/uL (ref 0.0–0.7)
Eosinophils Relative: 2 % (ref 0–5)
HCT: 37.6 % (ref 36.0–46.0)
Hemoglobin: 12.8 g/dL (ref 12.0–15.0)
Lymphocytes Relative: 31 % (ref 12–46)
Lymphs Abs: 1.9 10*3/uL (ref 0.7–4.0)
MCH: 33.5 pg (ref 26.0–34.0)
MCHC: 34 g/dL (ref 30.0–36.0)
MCV: 98.4 fL (ref 78.0–100.0)
Monocytes Absolute: 0.6 10*3/uL (ref 0.1–1.0)
Monocytes Relative: 10 % (ref 3–12)
Neutro Abs: 3.5 10*3/uL (ref 1.7–7.7)
Neutrophils Relative %: 57 % (ref 43–77)
Platelets: 217 10*3/uL (ref 150–400)
RBC: 3.82 MIL/uL — ABNORMAL LOW (ref 3.87–5.11)
RDW: 12.7 % (ref 11.5–15.5)
WBC: 6.2 10*3/uL (ref 4.0–10.5)

## 2013-05-27 MED ORDER — ONDANSETRON 4 MG PO TBDP
8.0000 mg | ORAL_TABLET | Freq: Once | ORAL | Status: AC
  Administered 2013-05-27: 8 mg via ORAL
  Filled 2013-05-27: qty 2

## 2013-05-27 MED ORDER — HYDROCODONE-ACETAMINOPHEN 5-325 MG PO TABS
2.0000 | ORAL_TABLET | Freq: Once | ORAL | Status: AC
  Administered 2013-05-27: 2 via ORAL
  Filled 2013-05-27: qty 2
  Filled 2013-05-27: qty 1

## 2013-05-27 MED ORDER — ONDANSETRON HCL 4 MG PO TABS: 4.0000 mg | ORAL_TABLET | Freq: Four times a day (QID) | ORAL | Status: DC

## 2013-05-27 MED ORDER — TRAMADOL HCL 50 MG PO TABS: 50.0000 mg | ORAL_TABLET | Freq: Four times a day (QID) | ORAL | Status: DC | PRN

## 2013-05-27 MED ORDER — IOHEXOL 300 MG/ML  SOLN
100.0000 mL | Freq: Once | INTRAMUSCULAR | Status: AC | PRN
  Administered 2013-05-27: 100 mL via INTRAVENOUS

## 2013-05-27 MED ORDER — FENTANYL CITRATE 0.05 MG/ML IJ SOLN
50.0000 ug | Freq: Once | INTRAMUSCULAR | Status: AC
  Administered 2013-05-27: 50 ug via INTRAVENOUS
  Filled 2013-05-27: qty 2
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
## 2013-06-12 ENCOUNTER — Emergency Department (HOSPITAL_COMMUNITY): Payer: Medicare Other

## 2013-06-12 ENCOUNTER — Encounter (HOSPITAL_COMMUNITY): Payer: Self-pay | Admitting: Emergency Medicine

## 2013-06-12 ENCOUNTER — Emergency Department (HOSPITAL_COMMUNITY)
Admission: EM | Admit: 2013-06-12 | Discharge: 2013-06-12 | Disposition: A | Discharge status: Home / Self Care | Payer: Medicare Other | Attending: Emergency Medicine | Admitting: Emergency Medicine

## 2013-06-12 DIAGNOSIS — Z87891 Personal history of nicotine dependence: Secondary | ICD-10-CM | POA: Insufficient documentation

## 2013-06-12 DIAGNOSIS — Z87828 Personal history of other (healed) physical injury and trauma: Secondary | ICD-10-CM | POA: Insufficient documentation

## 2013-06-12 DIAGNOSIS — H543 Unqualified visual loss, both eyes: Secondary | ICD-10-CM | POA: Insufficient documentation

## 2013-06-12 DIAGNOSIS — E119 Type 2 diabetes mellitus without complications: Secondary | ICD-10-CM | POA: Insufficient documentation

## 2013-06-12 DIAGNOSIS — Z8614 Personal history of Methicillin resistant Staphylococcus aureus infection: Secondary | ICD-10-CM | POA: Insufficient documentation

## 2013-06-12 DIAGNOSIS — Z87442 Personal history of urinary calculi: Secondary | ICD-10-CM | POA: Insufficient documentation

## 2013-06-12 DIAGNOSIS — R0989 Other specified symptoms and signs involving the circulatory and respiratory systems: Secondary | ICD-10-CM | POA: Insufficient documentation

## 2013-06-12 DIAGNOSIS — Z79899 Other long term (current) drug therapy: Secondary | ICD-10-CM | POA: Insufficient documentation

## 2013-06-12 DIAGNOSIS — I10 Essential (primary) hypertension: Secondary | ICD-10-CM | POA: Insufficient documentation

## 2013-06-12 DIAGNOSIS — Z8709 Personal history of other diseases of the respiratory system: Secondary | ICD-10-CM | POA: Insufficient documentation

## 2013-06-12 DIAGNOSIS — D649 Anemia, unspecified: Secondary | ICD-10-CM | POA: Insufficient documentation

## 2013-06-12 DIAGNOSIS — R0609 Other forms of dyspnea: Secondary | ICD-10-CM | POA: Insufficient documentation

## 2013-06-12 DIAGNOSIS — G2581 Restless legs syndrome: Secondary | ICD-10-CM | POA: Insufficient documentation

## 2013-06-12 DIAGNOSIS — E785 Hyperlipidemia, unspecified: Secondary | ICD-10-CM | POA: Insufficient documentation

## 2013-06-12 DIAGNOSIS — R071 Chest pain on breathing: Secondary | ICD-10-CM | POA: Insufficient documentation

## 2013-06-12 DIAGNOSIS — G8929 Other chronic pain: Secondary | ICD-10-CM | POA: Insufficient documentation

## 2013-06-12 DIAGNOSIS — F3289 Other specified depressive episodes: Secondary | ICD-10-CM | POA: Insufficient documentation

## 2013-06-12 DIAGNOSIS — F329 Major depressive disorder, single episode, unspecified: Secondary | ICD-10-CM | POA: Insufficient documentation

## 2013-06-12 DIAGNOSIS — R911 Solitary pulmonary nodule: Secondary | ICD-10-CM | POA: Insufficient documentation

## 2013-06-12 DIAGNOSIS — K219 Gastro-esophageal reflux disease without esophagitis: Secondary | ICD-10-CM | POA: Insufficient documentation

## 2013-06-12 DIAGNOSIS — M129 Arthropathy, unspecified: Secondary | ICD-10-CM | POA: Insufficient documentation

## 2013-06-12 DIAGNOSIS — Z8669 Personal history of other diseases of the nervous system and sense organs: Secondary | ICD-10-CM | POA: Insufficient documentation

## 2013-06-12 DIAGNOSIS — Z8542 Personal history of malignant neoplasm of other parts of uterus: Secondary | ICD-10-CM | POA: Insufficient documentation

## 2013-06-12 DIAGNOSIS — R0789 Other chest pain: Secondary | ICD-10-CM

## 2013-06-12 DIAGNOSIS — Z7982 Long term (current) use of aspirin: Secondary | ICD-10-CM | POA: Insufficient documentation

## 2013-06-12 LAB — BASIC METABOLIC PANEL
BUN: 23 mg/dL (ref 6–23)
CO2: 30 mEq/L (ref 19–32)
Calcium: 9.8 mg/dL (ref 8.4–10.5)
Chloride: 98 mEq/L (ref 96–112)
Creatinine, Ser: 0.98 mg/dL (ref 0.50–1.10)
GFR calc Af Amer: 64 mL/min — ABNORMAL LOW (ref >90)
GFR calc non Af Amer: 55 mL/min — ABNORMAL LOW (ref >90)
Glucose, Bld: 100 mg/dL — ABNORMAL HIGH (ref 70–99)
Potassium: 3.4 mEq/L — ABNORMAL LOW (ref 3.7–5.3)
Sodium: 141 mEq/L (ref 137–147)

## 2013-06-12 LAB — CBC WITH DIFFERENTIAL/PLATELET
Basophils Absolute: 0.1 10*3/uL (ref 0.0–0.1)
Basophils Relative: 1 % (ref 0–1)
Eosinophils Absolute: 0.3 10*3/uL (ref 0.0–0.7)
Eosinophils Relative: 4 % (ref 0–5)
HCT: 38.9 % (ref 36.0–46.0)
Hemoglobin: 13.1 g/dL (ref 12.0–15.0)
Lymphocytes Relative: 35 % (ref 12–46)
Lymphs Abs: 2.4 10*3/uL (ref 0.7–4.0)
MCH: 33.2 pg (ref 26.0–34.0)
MCHC: 33.7 g/dL (ref 30.0–36.0)
MCV: 98.7 fL (ref 78.0–100.0)
Monocytes Absolute: 0.6 10*3/uL (ref 0.1–1.0)
Monocytes Relative: 9 % (ref 3–12)
Neutro Abs: 3.5 10*3/uL (ref 1.7–7.7)
Neutrophils Relative %: 52 % (ref 43–77)
Platelets: 250 10*3/uL (ref 150–400)
RBC: 3.94 MIL/uL (ref 3.87–5.11)
RDW: 12.8 % (ref 11.5–15.5)
WBC: 6.8 10*3/uL (ref 4.0–10.5)

## 2013-06-12 LAB — D-DIMER, QUANTITATIVE: D-Dimer, Quant: 1.53 ug/mL-FEU — ABNORMAL HIGH (ref 0.00–0.48)

## 2013-06-12 MED ORDER — IOHEXOL 350 MG/ML SOLN
100.0000 mL | Freq: Once | INTRAVENOUS | Status: AC | PRN
  Administered 2013-06-12: 100 mL via INTRAVENOUS
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
## 2013-06-12 NOTE — ED Notes (Signed)
Patient transported to CT 

## 2013-06-12 NOTE — Discharge Instructions (Signed)
Chest Wall Pain Chest wall pain is pain felt in or around the chest bones and muscles. It may take up to 6 weeks to get better. It may take longer if you are active. Chest wall pain can happen on its own. Other times, things like germs, injury, coughing, or exercise can cause the pain. HOME CARE   Avoid activities that make you tired or cause pain. Try not to use your chest, belly (abdominal), or side muscles. Do not use heavy weights.  Put ice on the sore area.  Put ice in a plastic bag.  Place a towel between your skin and the bag.  Leave the ice on for 15-20 minutes for the first 2 days.  Only take medicine as told by your doctor. GET HELP RIGHT AWAY IF:   You have more pain or are very uncomfortable.  You have a fever.  Your chest pain gets worse.  You have new problems.  You feel sick to your stomach (nauseous) or throw up (vomit).  You start to sweat or feel lightheaded.  You have a cough with mucus (phlegm).  You cough up blood. MAKE SURE YOU:   Understand these instructions.  Will watch your condition.  Will get help right away if you are not doing well or get worse. Document Released: 09/18/2007 Document Revised: 06/24/2011 Document Reviewed: 11/26/2010 [facility] Patient Information 2014 [place], [place].  Incidental Abnormal Radiological Finding An incidental abnormal radiologic finding is a very small mass or scar tissue, detected anywhere in the body, unrelated to the reason for your visit. With newer imaging tests and technologies, it is becoming more common to detect small masses and tissue abnormalities. It is important for you to work with your caregiver because it can sometimes be related to undiagnosed illness or other symptoms. Most often however, the finding is not causing symptoms and is not a cause for concern. TYPES OF FINDINGS Abnormal radiologic findings are often located in the kidneys or lungs, but they can also be found in the heart, liver,  breasts, brain, gallbladder, uterus and other surrounding organs and tissues. There are many types of masses and tissue abnormalities that can be detected during an imaging test. These may include:  Lesions  changes in tissue due to infection, tissue death, or trauma.  Cysts a sac filled with fluid, crystals, or some other substance.  Tumors non-cancerous or cancerous solid formation. You may hear medical terms, such as, "pulmonary nodule" (a small mass in the lung) or "renal mass" (mass in the kidney). Ask your caregiver if these terms apply to your findings.  DO I NEED FURTHER DIAGNOSIS? There are many possible causes of incidental radiologic findings. Your caregiver will determine whether it requires additional screening tests, diagnostic tests, treatments, or referral to a surgeon.Generally, very small tissue changes or masses will not require any follow up testing. Much research has been done in this area.These very small abnormalities are considered low risk of becoming a problem in the future.  Depending on the size and appearance of the finding, your caregiver may recommend additional testing.Additional testing may also be recommended if you have certain risk factors or medical conditions that increase your risk of related problems. It is a good idea to have additional testing if you have other symptoms or concerns. Sometimes, these early findings can give you a chance at early treatment and avoid problems in the future. TESTING AND DIAGNOSIS Tests and exams may be a one-time screening or periodic follow-up. Periodic follow-up will help  your caregiver determine whether the abnormality is growing and becoming a concern. Tests may include:  Physical examination.  Blood tests.  Urine tests.  Imaging tests, such as abdominal ultrasound, CT scan, or MRI.  Biopsy. TREATMENT Treatment varies, depending on the cause, location, size and appearance of the finding. Treatment will also  depend on your age and underlying conditions or symptoms. Sometimes treatment is not necessary at all. However, treatment may include:  Watchful waiting with periodic examination and testing.  Treatments to reduce the size of the abnormality.  Surgical or biopsy removal of the abnormality.  Additional treatments to address any underlying conditions. HOME CARE INSTRUCTIONS   See your caregiver for follow up examination and testing as directed. It is important that you schedule appointments as directed.  Keep calm. Your caregiver will let you know if this is a routine follow up, or if there is a reason for concern. Remember, early detection can be very beneficial to you.  Follow all of your caregiver's aftercare instructions related to the reason for your visit. Document Released: 07/17/2010 Document Revised: 06/24/2011 Document Reviewed: 07/17/2010 [facility] Patient Information 2014 [place].
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
## 2013-06-12 NOTE — ED Notes (Signed)
Pain with palpation to right lateral rib below breast.

## 2013-07-09 ENCOUNTER — Other Ambulatory Visit (HOSPITAL_COMMUNITY): Payer: Self-pay | Admitting: Family Medicine

## 2013-07-09 DIAGNOSIS — R911 Solitary pulmonary nodule: Secondary | ICD-10-CM

## 2013-07-19 ENCOUNTER — Encounter: Payer: Self-pay | Admitting: Cardiothoracic Surgery

## 2013-07-19 ENCOUNTER — Other Ambulatory Visit: Payer: Self-pay

## 2013-07-19 ENCOUNTER — Encounter: Payer: Self-pay | Admitting: *Deleted

## 2013-07-19 ENCOUNTER — Encounter: Payer: Medicare Other | Admitting: Cardiothoracic Surgery

## 2013-07-19 ENCOUNTER — Institutional Professional Consult (permissible substitution) (INDEPENDENT_AMBULATORY_CARE_PROVIDER_SITE_OTHER): Payer: Medicare Other | Admitting: Cardiothoracic Surgery

## 2013-07-19 VITALS — BP 123/61 | HR 83 | Resp 16 | Ht 65.0 in | Wt 228.0 lb

## 2013-07-19 DIAGNOSIS — D381 Neoplasm of uncertain behavior of trachea, bronchus and lung: Secondary | ICD-10-CM

## 2013-07-19 DIAGNOSIS — M810 Age-related osteoporosis without current pathological fracture: Secondary | ICD-10-CM | POA: Insufficient documentation

## 2013-07-19 NOTE — Patient Instructions (Signed)
CT scan shows 1.2 cm right lower lung nodule    Pulmonary Nodule A pulmonary nodule is a small, round growth of tissue in the lung. Pulmonary nodules can range in size from less than 1/5 inch (4 mm) to a little bigger than an inch (25 mm). Most pulmonary nodules are detected when imaging tests of the lung are being performed for a different problem. Pulmonary nodules are usually not cancerous (benign). However, some pulmonary nodules are cancerous (malignant). Follow-up treatment or testing is based on the size of the pulmonary nodule and your risk of getting lung cancer.  CAUSES Benign pulmonary nodules can be caused by various things. Some of the causes include:   Bacterial, fungal, or viral infections. This is usually an old infection that is no longer active, but it can sometimes be a current, active infection.  A benign mass of tissue.  Inflammation from conditions such as rheumatoid arthritis.   Abnormal blood vessels in the lungs. Malignant pulmonary nodules can result from lung cancer or from cancers that spread to the lung from other places in the body. SIGNS AND SYMPTOMS Pulmonary nodules usually do not cause symptoms. DIAGNOSIS Most often, pulmonary nodules are found incidentally when an X-ray or CT scan is performed to look for some other problem in the lung area. To help determine whether a pulmonary nodule is benign or malignant, your health care provider will take a medical history and order a variety of tests. Tests done may include:   Blood tests.  A skin test called a tuberculin test. This test is used to determine if you have been exposed to the germ that causes tuberculosis.   Chest X-rays. If possible, a new X-ray may be compared with X-rays you have had in the past.   CT scan. This test shows smaller pulmonary nodules more clearly than an X-ray.   Positron emission tomography (PET) scan. In this test, a safe amount of a radioactive substance is injected into  the bloodstream. Then, the scan takes a picture of the pulmonary nodule. The radioactive substance is eliminated from your body in your urine.   Biopsy. A tiny piece of the pulmonary nodule is removed so it can be checked under a microscope. TREATMENT  Pulmonary nodules that are benign normally do not require any treatment because they usually do not cause symptoms or breathing problems. Your health care provider may want to monitor the pulmonary nodule through follow-up CT scans. The frequency of these CT scans will vary based on the size of the nodule and the risk factors for lung cancer. For example, CT scans will need to be done more frequently if the pulmonary nodule is larger and if you have a history of smoking and a family history of cancer. Further testing or biopsies may be done if any follow-up CT scan shows that the size of the pulmonary nodule has increased. HOME CARE INSTRUCTIONS  Only take over-the-counter or prescription medicines as directed by your health care provider.  Keep all follow-up appointments with your health care provider. SEEK MEDICAL CARE IF:  You have trouble breathing when you are active.   You feel sick or unusually tired.   You do not feel like eating.   You lose weight without trying to.   You develop chills or night sweats.  SEEK IMMEDIATE MEDICAL CARE IF:  You cannot catch your breath, or you begin wheezing.   You cannot stop coughing.   You cough up blood.   You become dizzy  or feel like you are going to pass out.   You have sudden chest pain.   You have a fever or persistent symptoms for more than 2 3 days.   You have a fever and your symptoms suddenly get worse. MAKE SURE YOU:  Understand these instructions.  Will watch your condition.  Will get help right away if you are not doing well or get worse. Document Released: 01/27/2009 Document Revised: 12/02/2012 Document Reviewed: 09/21/2012 [facility] Patient Information  2014 [place].
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
## 2013-07-21 ENCOUNTER — Encounter (HOSPITAL_COMMUNITY)
Admission: RE | Admit: 2013-07-21 | Discharge: 2013-07-21 | Disposition: A | Discharge status: Home / Self Care | Payer: Medicare Other | Source: Ambulatory Visit | Attending: Family Medicine | Admitting: Family Medicine

## 2013-07-21 ENCOUNTER — Inpatient Hospital Stay (HOSPITAL_COMMUNITY)
Admission: RE | Admit: 2013-07-21 | Discharge: 2013-07-21 | Disposition: A | Discharge status: Home / Self Care | Payer: Medicare Other | Source: Ambulatory Visit | Attending: Cardiothoracic Surgery | Admitting: Cardiothoracic Surgery

## 2013-07-21 DIAGNOSIS — I10 Essential (primary) hypertension: Secondary | ICD-10-CM | POA: Diagnosis not present

## 2013-07-21 DIAGNOSIS — Z8542 Personal history of malignant neoplasm of other parts of uterus: Secondary | ICD-10-CM | POA: Diagnosis not present

## 2013-07-21 DIAGNOSIS — Z7982 Long term (current) use of aspirin: Secondary | ICD-10-CM | POA: Insufficient documentation

## 2013-07-21 DIAGNOSIS — E119 Type 2 diabetes mellitus without complications: Secondary | ICD-10-CM | POA: Diagnosis not present

## 2013-07-21 DIAGNOSIS — D381 Neoplasm of uncertain behavior of trachea, bronchus and lung: Secondary | ICD-10-CM

## 2013-07-21 DIAGNOSIS — Z87891 Personal history of nicotine dependence: Secondary | ICD-10-CM | POA: Insufficient documentation

## 2013-07-21 DIAGNOSIS — R911 Solitary pulmonary nodule: Secondary | ICD-10-CM

## 2013-07-21 DIAGNOSIS — J988 Other specified respiratory disorders: Secondary | ICD-10-CM | POA: Diagnosis not present

## 2013-07-21 LAB — PULMONARY FUNCTION TEST
DL/VA % pred: 72 %
DL/VA: 3.56 ml/min/mmHg/L
DLCO unc % pred: 54 %
DLCO unc: 13.94 ml/min/mmHg
FEF 25-75 Post: 1.97 L/sec
FEF 25-75 Pre: 1.23 L/sec
FEF2575-%Change-Post: 60 %
FEF2575-%Pred-Post: 114 %
FEF2575-%Pred-Pre: 71 %
FEV1-%Change-Post: 13 %
FEV1-%Pred-Post: 80 %
FEV1-%Pred-Pre: 70 %
FEV1-Post: 1.78 L
FEV1-Pre: 1.56 L
FEV1FVC-%Change-Post: 0 %
FEV1FVC-%Pred-Pre: 99 %
FEV6-%Change-Post: 14 %
FEV6-%Pred-Post: 84 %
FEV6-%Pred-Pre: 73 %
FEV6-Post: 2.36 L
FEV6-Pre: 2.06 L
FEV6FVC-%Pred-Post: 105 %
FEV6FVC-%Pred-Pre: 105 %
FVC-%Change-Post: 12 %
FVC-%Pred-Post: 80 %
FVC-%Pred-Pre: 71 %
FVC-Post: 2.36 L
FVC-Pre: 2.09 L
Post FEV1/FVC ratio: 75 %
Post FEV6/FVC ratio: 100 %
Pre FEV1/FVC ratio: 75 %
Pre FEV6/FVC Ratio: 100 %
RV % pred: 133 %
RV: 3.13 L
TLC % pred: 100 %
TLC: 5.24 L

## 2013-07-21 LAB — GLUCOSE, CAPILLARY: Glucose-Capillary: 109 mg/dL — ABNORMAL HIGH (ref 70–99)

## 2013-07-21 MED ORDER — FLUDEOXYGLUCOSE F - 18 (FDG) INJECTION
11.2000 | Freq: Once | INTRAVENOUS | Status: AC | PRN
Start: 2013-07-21 — End: 2013-07-21
  Administered 2013-07-21: 11.2 via INTRAVENOUS

## 2013-07-22 ENCOUNTER — Encounter: Payer: Self-pay | Admitting: Cardiothoracic Surgery

## 2013-07-22 ENCOUNTER — Ambulatory Visit (INDEPENDENT_AMBULATORY_CARE_PROVIDER_SITE_OTHER): Payer: Medicare Other | Admitting: Cardiothoracic Surgery

## 2013-07-22 VITALS — BP 124/61 | HR 77 | Resp 20 | Ht 65.0 in | Wt 228.0 lb

## 2013-07-22 DIAGNOSIS — D381 Neoplasm of uncertain behavior of trachea, bronchus and lung: Secondary | ICD-10-CM
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
## 2013-10-04 ENCOUNTER — Other Ambulatory Visit: Payer: Self-pay

## 2013-10-04 DIAGNOSIS — D381 Neoplasm of uncertain behavior of trachea, bronchus and lung: Secondary | ICD-10-CM

## 2013-11-04 ENCOUNTER — Ambulatory Visit
Admission: RE | Admit: 2013-11-04 | Discharge: 2013-11-04 | Disposition: A | Discharge status: Home / Self Care | Payer: Medicare Other | Source: Ambulatory Visit | Attending: Cardiothoracic Surgery | Admitting: Cardiothoracic Surgery

## 2013-11-04 ENCOUNTER — Ambulatory Visit (INDEPENDENT_AMBULATORY_CARE_PROVIDER_SITE_OTHER): Payer: Medicare Other | Admitting: Cardiothoracic Surgery

## 2013-11-04 ENCOUNTER — Encounter: Payer: Self-pay | Admitting: Cardiothoracic Surgery

## 2013-11-04 VITALS — BP 108/62 | HR 79 | Resp 20 | Ht 65.0 in | Wt 228.0 lb

## 2013-11-04 DIAGNOSIS — D381 Neoplasm of uncertain behavior of trachea, bronchus and lung: Secondary | ICD-10-CM
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
## 2013-11-29 ENCOUNTER — Other Ambulatory Visit: Payer: Self-pay | Admitting: Radiology

## 2014-01-31 DIAGNOSIS — M25519 Pain in unspecified shoulder: Secondary | ICD-10-CM | POA: Insufficient documentation

## 2014-02-08 ENCOUNTER — Ambulatory Visit: Payer: Medicare Other | Attending: Neurosurgery | Admitting: Physical Therapy

## 2014-02-08 DIAGNOSIS — M25511 Pain in right shoulder: Secondary | ICD-10-CM | POA: Diagnosis not present

## 2014-02-08 DIAGNOSIS — M542 Cervicalgia: Secondary | ICD-10-CM | POA: Diagnosis not present

## 2014-02-08 DIAGNOSIS — I10 Essential (primary) hypertension: Secondary | ICD-10-CM | POA: Insufficient documentation

## 2014-02-08 DIAGNOSIS — Z5189 Encounter for other specified aftercare: Secondary | ICD-10-CM | POA: Diagnosis not present

## 2014-02-08 DIAGNOSIS — Z96652 Presence of left artificial knee joint: Secondary | ICD-10-CM | POA: Insufficient documentation

## 2014-02-08 DIAGNOSIS — E119 Type 2 diabetes mellitus without complications: Secondary | ICD-10-CM | POA: Insufficient documentation

## 2014-02-10 ENCOUNTER — Ambulatory Visit: Payer: Medicare Other | Admitting: Physical Therapy

## 2014-02-10 DIAGNOSIS — Z5189 Encounter for other specified aftercare: Secondary | ICD-10-CM | POA: Diagnosis not present

## 2014-02-15 ENCOUNTER — Ambulatory Visit: Payer: Medicare Other | Attending: Neurosurgery | Admitting: Physical Therapy

## 2014-02-15 DIAGNOSIS — M542 Cervicalgia: Secondary | ICD-10-CM | POA: Insufficient documentation

## 2014-02-15 DIAGNOSIS — I10 Essential (primary) hypertension: Secondary | ICD-10-CM | POA: Insufficient documentation

## 2014-02-15 DIAGNOSIS — Z5189 Encounter for other specified aftercare: Secondary | ICD-10-CM | POA: Insufficient documentation

## 2014-02-15 DIAGNOSIS — E119 Type 2 diabetes mellitus without complications: Secondary | ICD-10-CM | POA: Diagnosis not present

## 2014-02-15 DIAGNOSIS — M25511 Pain in right shoulder: Secondary | ICD-10-CM | POA: Insufficient documentation

## 2014-02-15 DIAGNOSIS — Z96652 Presence of left artificial knee joint: Secondary | ICD-10-CM | POA: Diagnosis not present

## 2014-02-17 ENCOUNTER — Ambulatory Visit: Payer: Medicare Other | Admitting: Physical Therapy

## 2014-02-17 DIAGNOSIS — Z5189 Encounter for other specified aftercare: Secondary | ICD-10-CM | POA: Diagnosis not present

## 2014-02-22 ENCOUNTER — Ambulatory Visit: Payer: Medicare Other | Admitting: Physical Therapy

## 2014-02-22 DIAGNOSIS — Z5189 Encounter for other specified aftercare: Secondary | ICD-10-CM | POA: Diagnosis not present

## 2014-02-24 ENCOUNTER — Other Ambulatory Visit: Payer: Self-pay | Admitting: Neurosurgery

## 2014-02-24 DIAGNOSIS — M25511 Pain in right shoulder: Secondary | ICD-10-CM

## 2014-03-07 ENCOUNTER — Ambulatory Visit
Admission: RE | Admit: 2014-03-07 | Discharge: 2014-03-07 | Disposition: A | Discharge status: Home / Self Care | Payer: Medicare Other | Source: Ambulatory Visit | Attending: Neurosurgery | Admitting: Neurosurgery

## 2014-03-07 ENCOUNTER — Other Ambulatory Visit: Payer: Self-pay | Admitting: Neurosurgery

## 2014-03-07 DIAGNOSIS — M25511 Pain in right shoulder: Secondary | ICD-10-CM

## 2014-03-09 ENCOUNTER — Other Ambulatory Visit: Payer: Medicare Other

## 2014-03-17 ENCOUNTER — Ambulatory Visit
Admission: RE | Admit: 2014-03-17 | Discharge: 2014-03-17 | Disposition: A | Discharge status: Home / Self Care | Payer: Medicare Other | Source: Ambulatory Visit | Attending: Neurosurgery | Admitting: Neurosurgery

## 2014-03-17 DIAGNOSIS — M25511 Pain in right shoulder: Secondary | ICD-10-CM

## 2014-03-22 ENCOUNTER — Other Ambulatory Visit: Payer: Self-pay | Admitting: *Deleted

## 2014-03-22 DIAGNOSIS — R911 Solitary pulmonary nodule: Secondary | ICD-10-CM

## 2014-04-11 ENCOUNTER — Other Ambulatory Visit: Payer: Medicare Other

## 2014-04-13 ENCOUNTER — Ambulatory Visit (INDEPENDENT_AMBULATORY_CARE_PROVIDER_SITE_OTHER): Payer: Medicare Other | Admitting: Cardiovascular Disease

## 2014-04-13 ENCOUNTER — Encounter: Payer: Self-pay | Admitting: Cardiovascular Disease

## 2014-04-13 VITALS — BP 112/58 | HR 81 | Ht 65.0 in | Wt 227.8 lb

## 2014-04-13 DIAGNOSIS — I10 Essential (primary) hypertension: Secondary | ICD-10-CM

## 2014-04-13 DIAGNOSIS — Z01818 Encounter for other preprocedural examination: Secondary | ICD-10-CM

## 2014-04-13 NOTE — Assessment & Plan Note (Signed)
History of hypertension with blood pressure today measured at 112/58. She is on losartan, chlorothiazide. Continue current medications at current dosing

## 2014-04-13 NOTE — Progress Notes (Signed)
04/13/2014 Leslie Hendricks   1940-08-09  [id number]  Primary Physician Florina Ou, MD Primary Cardiologist: Lorretta Harp MD Leslie Hendricks   HPI:  The patient is a 73 year old, mildly overweight, widowed Caucasian female with no children who lives alone. Her husband of 73 years died on 2009-04-15. She has retired from the [employer]. I saw her a year and a half ago. Her risk factors include 45 pack-year history of tobacco abuse, having quit 12 years ago; treated hypertension; diabetes; and hyperlipidemia. She does have a strong family history of heart disease. Both parents had bypass surgery. Three of her siblings have myocardial infractions. She had a negative pharmacologic stress test on July 03, 2010. She denies chest pain or shortness of breath. Her major complaints are of lack of stability on her legs, but she really denies claudication. She walks with the aid of a walker. She had arterial Dopplers performed in our office in March of 2012 revealing ABI of 0.98 on the right and 0.77 on the left. She has a very limited functional existence from bed to chair. She rarely gets out of the house. Since I saw her back over one year ago she's had no cardiovascular complaints.she was evaluated by Dr. Servando Snare because of the lung nodule that was not metabolic and therefore the decision was made to follow this conservatively. She apparently needs a knee replacement and was referred back here for cardiovascular clearance.   Current Outpatient Prescriptions  Medication Sig Dispense Refill  . Ascorbic Acid (VITAMIN C) 1000 MG tablet Take 1,000 mg by mouth daily.    Marland Kitchen aspirin 325 MG EC tablet Take 325 mg by mouth daily.     Marland Kitchen atorvastatin (LIPITOR) 80 MG tablet Take 80 mg by mouth at bedtime.     . Calcium Carbonate-Vitamin D (CALTRATE 600+D PO) Take 1 tablet by mouth daily.     . cholecalciferol (VITAMIN D) 1000 UNITS tablet Take 1,000 Units by mouth daily.    . diazepam  (VALIUM) 5 MG tablet Take 5 mg by mouth at bedtime.     . fish oil-omega-3 fatty acids 1000 MG capsule Take 1 g by mouth daily.     . folic acid (FOLVITE) 528 MCG tablet Take 400 mcg by mouth daily.     . furosemide (LASIX) 20 MG tablet Take 20 mg by mouth daily.     Marland Kitchen HYDROcodone-acetaminophen (NORCO) 10-325 MG per tablet Take 1 tablet by mouth every 6 (six) hours as needed.    Marland Kitchen losartan-hydrochlorothiazide (HYZAAR) 50-12.5 MG per tablet Take 1 tablet by mouth daily.    Marland Kitchen MAGNESIUM-ZINC PO Take 1 tablet by mouth daily.    . metFORMIN (GLUCOPHAGE-XR) 500 MG 24 hr tablet Take 500 mg by mouth daily with breakfast.    . Multiple Vitamins-Minerals (VISION PLUS) CAPS Take 1 capsule by mouth daily.    Vladimir Faster Glycol-Propyl Glycol (SYSTANE OP) Place 1 drop into both eyes 4 (four) times daily.     . Sennosides 25 MG TABS Take 50 mg by mouth at bedtime.    . Thiamine HCl (VITAMIN B-1) 250 MG tablet Take 250 mg by mouth daily.     Marland Kitchen venlafaxine (EFFEXOR-XR) 75 MG 24 hr capsule Take 75 mg by mouth daily with breakfast.     . vitamin B-12 (CYANOCOBALAMIN) 1000 MCG tablet Take 1,000 mcg by mouth daily.    . vitamin E 400 UNIT capsule Take 400 Units by mouth daily.  No current facility-administered medications for this visit.    Allergies  Allergen Reactions  . Banana Nausea And Vomiting  . Codeine Nausea And Vomiting  . Dilaudid [Hydromorphone] Other (See Comments)    Altered mental status  . Oxycodone Hcl Nausea And Vomiting  . Sausage [Pickled Meat] Nausea And Vomiting    History   Social History  . Marital Status: Widowed    Spouse Name: N/A    Number of Children: N/A  . Years of Education: N/A   Occupational History  . Not on file.   Social History Main Topics  . Smoking status: Former Smoker -- 1.00 packs/day for 30 years    Types: Cigarettes    Quit date: 04/15/1998  . Smokeless tobacco: Never Used     Comment: quit about 54yrs ago  . Alcohol Use: No  . Drug Use: No    . Sexual Activity: No   Other Topics Concern  . Not on file   Social History Narrative     Review of Systems: General: negative for chills, fever, night sweats or weight changes.  Cardiovascular: negative for chest pain, dyspnea on exertion, edema, orthopnea, palpitations, paroxysmal nocturnal dyspnea or shortness of breath Dermatological: negative for rash Respiratory: negative for cough or wheezing Urologic: negative for hematuria Abdominal: negative for nausea, vomiting, diarrhea, bright red blood per rectum, melena, or hematemesis Neurologic: negative for visual changes, syncope, or dizziness All other systems reviewed and are otherwise negative except as noted above.    Blood pressure 112/58, pulse 81, height 5\' 5"  (1.651 m), weight 227 lb 12.8 oz (103.329 kg).  General appearance: alert and no distress Neck: no adenopathy, no carotid bruit, no JVD, supple, symmetrical, trachea midline and thyroid not enlarged, symmetric, no tenderness/mass/nodules Lungs: clear to auscultation bilaterally Heart: regular rate and rhythm, S1, S2 normal, no murmur, click, rub or gallop Extremities: extremities normal, atraumatic, no cyanosis or edema  EKG normal sinus rhythm at 81 occasional PVC and nonspecific ST and T-wave abnormalities. I personally reviewed this EKG  ASSESSMENT AND PLAN:   Essential hypertension History of hypertension with blood pressure today measured at 112/58. She is on losartan, chlorothiazide. Continue current medications at current dosing  HYPERLIPIDEMIA History of hyperlipidemia on atorvastatin 80 mg a day followed by her PCP.      Lorretta Harp MD FACP,FACC,FAHA, [facility] 04/13/2014 1:55 PM

## 2014-04-13 NOTE — Assessment & Plan Note (Signed)
History of hyperlipidemia on atorvastatin 80 mg a day followed by her PCP 

## 2014-04-13 NOTE — Patient Instructions (Signed)
Your physician wants you to follow-up in: 1 Year You will receive a reminder letter in the mail two months in advance. If you don't receive a letter, please call our office to schedule the follow-up appointment.  Your physician has requested that you have a lexiscan myoview. For further information please visit [email or web address]. Please follow instruction sheet, as given.

## 2014-04-14 ENCOUNTER — Telehealth (HOSPITAL_COMMUNITY): Payer: Self-pay

## 2014-04-14 NOTE — Telephone Encounter (Signed)
Encounter complete. 

## 2014-04-18 ENCOUNTER — Ambulatory Visit: Payer: Self-pay | Admitting: Orthopedic Surgery

## 2014-04-18 NOTE — Progress Notes (Signed)
Preoperative surgical orders have been place into the Epic hospital system for Leslie Hendricks on 04/18/2014, 12:37 PM  by Mickel Crow for surgery on 05-02-2014.  Preop Total Knee orders including Experal, IV Tylenol, and IV Decadron as long as there are no contraindications to the above medications. Arlee Muslim, PA-C

## 2014-04-20 ENCOUNTER — Ambulatory Visit (HOSPITAL_COMMUNITY)
Admission: RE | Admit: 2014-04-20 | Discharge: 2014-04-20 | Disposition: A | Discharge status: Home / Self Care | Payer: Medicare Other | Source: Ambulatory Visit | Attending: Internal Medicine | Admitting: Internal Medicine

## 2014-04-20 ENCOUNTER — Telehealth: Payer: Self-pay | Admitting: *Deleted

## 2014-04-20 DIAGNOSIS — I10 Essential (primary) hypertension: Secondary | ICD-10-CM | POA: Diagnosis not present

## 2014-04-20 DIAGNOSIS — R0609 Other forms of dyspnea: Secondary | ICD-10-CM | POA: Insufficient documentation

## 2014-04-20 DIAGNOSIS — R5383 Other fatigue: Secondary | ICD-10-CM | POA: Diagnosis not present

## 2014-04-20 DIAGNOSIS — Z8249 Family history of ischemic heart disease and other diseases of the circulatory system: Secondary | ICD-10-CM | POA: Diagnosis not present

## 2014-04-20 DIAGNOSIS — I493 Ventricular premature depolarization: Secondary | ICD-10-CM | POA: Insufficient documentation

## 2014-04-20 DIAGNOSIS — Z87891 Personal history of nicotine dependence: Secondary | ICD-10-CM | POA: Diagnosis not present

## 2014-04-20 DIAGNOSIS — Z6837 Body mass index (BMI) 37.0-37.9, adult: Secondary | ICD-10-CM | POA: Diagnosis not present

## 2014-04-20 DIAGNOSIS — R42 Dizziness and giddiness: Secondary | ICD-10-CM | POA: Insufficient documentation

## 2014-04-20 DIAGNOSIS — R9431 Abnormal electrocardiogram [ECG] [EKG]: Secondary | ICD-10-CM | POA: Diagnosis not present

## 2014-04-20 DIAGNOSIS — R002 Palpitations: Secondary | ICD-10-CM | POA: Insufficient documentation

## 2014-04-20 DIAGNOSIS — E669 Obesity, unspecified: Secondary | ICD-10-CM | POA: Diagnosis not present

## 2014-04-20 DIAGNOSIS — I6529 Occlusion and stenosis of unspecified carotid artery: Secondary | ICD-10-CM | POA: Diagnosis not present

## 2014-04-20 DIAGNOSIS — E119 Type 2 diabetes mellitus without complications: Secondary | ICD-10-CM | POA: Diagnosis not present

## 2014-04-20 DIAGNOSIS — Z01818 Encounter for other preprocedural examination: Secondary | ICD-10-CM | POA: Insufficient documentation

## 2014-04-20 DIAGNOSIS — Z01810 Encounter for preprocedural cardiovascular examination: Secondary | ICD-10-CM

## 2014-04-20 MED ORDER — TECHNETIUM TC 99M SESTAMIBI GENERIC - CARDIOLITE
10.2000 | Freq: Once | INTRAVENOUS | Status: AC | PRN
  Administered 2014-04-20: 10 via INTRAVENOUS

## 2014-04-20 MED ORDER — AMINOPHYLLINE 25 MG/ML IV SOLN
125.0000 mg | Freq: Once | INTRAVENOUS | Status: AC
  Administered 2014-04-20: 125 mg via INTRAVENOUS

## 2014-04-20 MED ORDER — REGADENOSON 0.4 MG/5ML IV SOLN
0.4000 mg | Freq: Once | INTRAVENOUS | Status: AC
  Administered 2014-04-20: 0.4 mg via INTRAVENOUS

## 2014-04-20 MED ORDER — TECHNETIUM TC 99M SESTAMIBI GENERIC - CARDIOLITE
30.5000 | Freq: Once | INTRAVENOUS | Status: AC | PRN
  Administered 2014-04-20: 31 via INTRAVENOUS

## 2014-04-20 NOTE — Telephone Encounter (Signed)
Pt recently had Lexiscan Myoview for cardiac clearance.

## 2014-04-20 NOTE — Procedures (Addendum)
[facility] [address] [place] [address] [phone]  Cardiology Nuclear Med Study  OTA EBERSOLE is a 74 y.o. female     MRN : [id number]     DOB: [birth year]  Procedure Date: 04/20/2014  Nuclear Med Background Indication for Stress Test:  Surgical Clearance and PVC's;NS-ST and T-wave abnormalities. History:  No prior respiratory history reported;Last NUC MPI on 07/03/2010-normal Cardiac Risk Factors: Carotid Disease, Family History - CAD, History of Smoking, Hypertension, Lipids, NIDDM and Obesity  Symptoms:  Dizziness, DOE, Fatigue, Light-Headedness and Palpitations   Nuclear Pre-Procedure Caffeine/Decaff Intake:  1:00am NPO After: 11am   IV Site: R Forearm  IV 0.9% NS with Angio Cath:  22g  Chest Size (in):  n/a IV Started by: Leslie Course, RN  Height: 5\' 5"  (1.651 m)  Cup Size: DD  BMI:  Body mass index is 37.77 kg/(m^2). Weight:  227 lb (102.967 kg)   Tech Comments:  n/a    Nuclear Med Study 1 or 2 day study: 1 day  Stress Test Type:  [place] Provider:  Quay Burow, MD   Resting Radionuclide: Technetium 79m Sestamibi  Resting Radionuclide Dose: 10.2 mCi   Stress Radionuclide:  Technetium 57m Sestamibi  Stress Radionuclide Dose: 30.5 mCi           Stress Protocol Rest HR: 58 Stress HR: 85  Rest BP: 131/65 Stress BP: 153/60  Exercise Time (min): n/a METS: n/a          Dose of Adenosine (mg):  n/a Dose of Lexiscan: 0.4 mg  Dose of Atropine (mg): n/a Dose of Dobutamine: n/a mcg/kg/min (at max HR)  Stress Test Technologist: Leslie Hendricks, CCT Nuclear Technologist: Leslie Hendricks, CNMT   Rest Procedure:  Myocardial perfusion imaging was performed at rest 45 minutes following the intravenous administration of Technetium 102m Sestamibi. Stress Procedure:  The patient received IV Lexiscan 0.4 mg over 15-seconds.  Technetium 68m Sestamibi injected IV at 30-seconds.  Patient experienced  shortness of breath, head pain and was administered 125 mg of Aminophylline IV. There were no significant changes with Lexiscan.  Quantitative spect images were obtained after a 45 minute delay.  Transient Ischemic Dilatation (Normal <1.22):  1.16  QGS EDV:  68 ml QGS ESV:  28 ml LV Ejection Fraction: 58%       Rest ECG: NSR - Normal EKG  Stress ECG: No significant change from baseline ECG  QPS Raw Data Images:  Normal; no motion artifact; normal heart/lung ratio. Stress Images:  Normal homogeneous uptake in all areas of the myocardium. Rest Images:  Normal homogeneous uptake in all areas of the myocardium. Subtraction (SDS):  No evidence of ischemia.  Impression Exercise Capacity:  Lexiscan with no exercise. BP Response:  Normal blood pressure response. Clinical Symptoms:  No significant symptoms noted. ECG Impression:  No significant ST segment change suggestive of ischemia. Comparison with Prior Nuclear Study: No significant change from previous study  Overall Impression:  Normal stress nuclear study.  LV Wall Motion:  NL LV Function; NL Wall Motion   Leslie Harp, MD  04/20/2014 4:33 PM

## 2014-04-20 NOTE — Telephone Encounter (Signed)
-----   Message from Lorretta Harp, MD sent at 04/20/2014  4:54 PM EST ----- Call and tell normal

## 2014-04-21 ENCOUNTER — Encounter (HOSPITAL_COMMUNITY): Payer: Medicare Other

## 2014-04-22 NOTE — Patient Instructions (Addendum)
Leslie Hendricks  04/22/2014   Your procedure is scheduled on: 05/02/14    Report to [facility] Main  Entrance and follow signs to               [place] at       0730 AM.  Call this number if you have problems the morning of surgery [phone]   Remember:  Do not eat food or drink liquids :After Midnight.     Take these medicines the morning of surgery with A SIP OF WATER: Systane eye drops, Effexor                                You may not have any metal on your body including hair pins and              piercings  Do not wear jewelry, make-up, lotions, powders or perfumes.             Do not wear nail polish.  Do not shave  48 hours prior to surgery.               Do not bring valuables to the hospital. [place].  Contacts, dentures or bridgework may not be worn into surgery.  Leave suitcase in the car. After surgery it may be brought to your room.         Special Instructions: coughing and deep breathing exercises, leg exercises               Please read over the following fact sheets you were given: _____________________________________________________________________             [facility] - Preparing for Surgery Before surgery, you can play an important role.  Because skin is not sterile, your skin needs to be as free of germs as possible.  You can reduce the number of germs on your skin by washing with CHG (chlorahexidine gluconate) soap before surgery.  CHG is an antiseptic cleaner which kills germs and bonds with the skin to continue killing germs even after washing. Please DO NOT use if you have an allergy to CHG or antibacterial soaps.  If your skin becomes reddened/irritated stop using the CHG and inform your nurse when you arrive at Short Stay. Do not shave (including legs and underarms) for at least 48 hours prior to the first CHG shower.  You may shave your face/neck. Please  follow these instructions carefully:  1.  Shower with CHG Soap the night before surgery and the  morning of Surgery.  2.  If you choose to wash your hair, wash your hair first as usual with your  normal  shampoo.  3.  After you shampoo, rinse your hair and body thoroughly to remove the  shampoo.                           4.  Use CHG as you would any other liquid soap.  You can apply chg directly  to the skin and wash                       Gently with a scrungie or clean washcloth.  5.  Apply the CHG Soap to your body ONLY FROM THE NECK DOWN.   Do not use on face/ open                           Wound or open sores. Avoid contact with eyes, ears mouth and genitals (private parts).                       Wash face,  Genitals (private parts) with your normal soap.             6.  Wash thoroughly, paying special attention to the area where your surgery  will be performed.  7.  Thoroughly rinse your body with warm water from the neck down.  8.  DO NOT shower/wash with your normal soap after using and rinsing off  the CHG Soap.                9.  Pat yourself dry with a clean towel.            10.  Wear clean pajamas.            11.  Place clean sheets on your bed the night of your first shower and do not  sleep with pets. Day of Surgery : Do not apply any lotions/deodorants the morning of surgery.  Please wear clean clothes to the hospital/surgery center.  FAILURE TO FOLLOW THESE INSTRUCTIONS MAY RESULT IN THE CANCELLATION OF YOUR SURGERY PATIENT SIGNATURE_________________________________  NURSE SIGNATURE__________________________________  ________________________________________________________________________  WHAT IS A BLOOD TRANSFUSION? Blood Transfusion Information  A transfusion is the replacement of blood or some of its parts. Blood is made up of multiple cells which provide different functions.  Red blood cells carry oxygen and are used for blood loss replacement.  White blood cells  fight against infection.  Platelets control bleeding.  Plasma helps clot blood.  Other blood products are available for specialized needs, such as hemophilia or other clotting disorders. BEFORE THE TRANSFUSION  Who gives blood for transfusions?   Healthy volunteers who are fully evaluated to make sure their blood is safe. This is blood bank blood. Transfusion therapy is the safest it has ever been in the practice of medicine. Before blood is taken from a donor, a complete history is taken to make sure that person has no history of diseases nor engages in risky social behavior (examples are intravenous drug use or sexual activity with multiple partners). The donor's travel history is screened to minimize risk of transmitting infections, such as malaria. The donated blood is tested for signs of infectious diseases, such as HIV and hepatitis. The blood is then tested to be sure it is compatible with you in order to minimize the chance of a transfusion reaction. If you or a relative donates blood, this is often done in anticipation of surgery and is not appropriate for emergency situations. It takes many days to process the donated blood. RISKS AND COMPLICATIONS Although transfusion therapy is very safe and saves many lives, the main dangers of transfusion include:  1. Getting an infectious disease. 2. Developing a transfusion reaction. This is an allergic reaction to something in the blood you were given. Every precaution is taken to prevent this. The decision to have a blood transfusion has been considered carefully by your caregiver before blood is given. Blood is not given unless the benefits outweigh the risks. AFTER THE TRANSFUSION  Right after  receiving a blood transfusion, you will usually feel much better and more energetic. This is especially true if your red blood cells have gotten low (anemic). The transfusion raises the level of the red blood cells which carry oxygen, and this usually  causes an energy increase.  The nurse administering the transfusion will monitor you carefully for complications. HOME CARE INSTRUCTIONS  No special instructions are needed after a transfusion. You may find your energy is better. Speak with your caregiver about any limitations on activity for underlying diseases you may have. SEEK MEDICAL CARE IF:   Your condition is not improving after your transfusion.  You develop redness or irritation at the intravenous (IV) site. SEEK IMMEDIATE MEDICAL CARE IF:  Any of the following symptoms occur over the next 12 hours:  Shaking chills.  You have a temperature by mouth above 102 F (38.9 C), not controlled by medicine.  Chest, back, or muscle pain.  People around you feel you are not acting correctly or are confused.  Shortness of breath or difficulty breathing.  Dizziness and fainting.  You get a rash or develop hives.  You have a decrease in urine output.  Your urine turns a dark color or changes to pink, red, or brown. Any of the following symptoms occur over the next 10 days:  You have a temperature by mouth above 102 F (38.9 C), not controlled by medicine.  Shortness of breath.  Weakness after normal activity.  The white part of the eye turns yellow (jaundice).  You have a decrease in the amount of urine or are urinating less often.  Your urine turns a dark color or changes to pink, red, or brown. Document Released: 03/29/2000 Document Revised: 06/24/2011 Document Reviewed: 11/16/2007 ExitCare Patient Information 2014 [place].  _______________________________________________________________________  Incentive Spirometer  An incentive spirometer is a tool that can help keep your lungs clear and active. This tool measures how well you are filling your lungs with each breath. Taking long deep breaths may help reverse or decrease the chance of developing breathing (pulmonary) problems (especially infection)  following:  A long period of time when you are unable to move or be active. BEFORE THE PROCEDURE   If the spirometer includes an indicator to show your best effort, your nurse or respiratory therapist will set it to a desired goal.  If possible, sit up straight or lean slightly forward. Try not to slouch.  Hold the incentive spirometer in an upright position. INSTRUCTIONS FOR USE  3. Sit on the edge of your bed if possible, or sit up as far as you can in bed or on a chair. 4. Hold the incentive spirometer in an upright position. 5. Breathe out normally. 6. Place the mouthpiece in your mouth and seal your lips tightly around it. 7. Breathe in slowly and as deeply as possible, raising the piston or the ball toward the top of the column. 8. Hold your breath for 3-5 seconds or for as long as possible. Allow the piston or ball to fall to the bottom of the column. 9. Remove the mouthpiece from your mouth and breathe out normally. 10. Rest for a few seconds and repeat Steps 1 through 7 at least 10 times every 1-2 hours when you are awake. Take your time and take a few normal breaths between deep breaths. 11. The spirometer may include an indicator to show your best effort. Use the indicator as a goal to work toward during each repetition. 12. After each set  of 10 deep breaths, practice coughing to be sure your lungs are clear. If you have an incision (the cut made at the time of surgery), support your incision when coughing by placing a pillow or rolled up towels firmly against it. Once you are able to get out of bed, walk around indoors and cough well. You may stop using the incentive spirometer when instructed by your caregiver.  RISKS AND COMPLICATIONS  Take your time so you do not get dizzy or light-headed.  If you are in pain, you may need to take or ask for pain medication before doing incentive spirometry. It is harder to take a deep breath if you are having pain. AFTER USE  Rest and  breathe slowly and easily.  It can be helpful to keep track of a log of your progress. Your caregiver can provide you with a simple table to help with this. If you are using the spirometer at home, follow these instructions: [place] IF:   You are having difficultly using the spirometer.  You have trouble using the spirometer as often as instructed.  Your pain medication is not giving enough relief while using the spirometer.  You develop fever of 100.5 F (38.1 C) or higher. SEEK IMMEDIATE MEDICAL CARE IF:   You cough up bloody sputum that had not been present before.  You develop fever of 102 F (38.9 C) or greater.  You develop worsening pain at or near the incision site. MAKE SURE YOU:   Understand these instructions.  Will watch your condition.  Will get help right away if you are not doing well or get worse. Document Released: 08/12/2006 Document Revised: 06/24/2011 Document Reviewed: 10/13/2006 [facility] Patient Information 2014 [place], [place].   ________________________________________________________________________

## 2014-04-25 ENCOUNTER — Encounter (HOSPITAL_COMMUNITY)
Admission: RE | Admit: 2014-04-25 | Discharge: 2014-04-25 | Disposition: A | Discharge status: Home / Self Care | Payer: Medicare Other | Source: Ambulatory Visit | Attending: Orthopedic Surgery | Admitting: Orthopedic Surgery

## 2014-04-25 ENCOUNTER — Encounter (HOSPITAL_COMMUNITY): Payer: Self-pay

## 2014-04-25 DIAGNOSIS — M1712 Unilateral primary osteoarthritis, left knee: Secondary | ICD-10-CM | POA: Insufficient documentation

## 2014-04-25 DIAGNOSIS — Z01812 Encounter for preprocedural laboratory examination: Secondary | ICD-10-CM | POA: Diagnosis not present

## 2014-04-25 DIAGNOSIS — Z01818 Encounter for other preprocedural examination: Secondary | ICD-10-CM | POA: Diagnosis not present

## 2014-04-25 HISTORY — DX: Headache, unspecified: R51.9

## 2014-04-25 HISTORY — DX: Anxiety disorder, unspecified: F41.9

## 2014-04-25 HISTORY — DX: Headache: R51

## 2014-04-25 LAB — COMPREHENSIVE METABOLIC PANEL
ALT: 19 U/L (ref 0–35)
AST: 28 U/L (ref 0–37)
Albumin: 4.1 g/dL (ref 3.5–5.2)
Alkaline Phosphatase: 83 U/L (ref 39–117)
Anion gap: 7 (ref 5–15)
BUN: 23 mg/dL (ref 6–23)
CO2: 33 mmol/L — ABNORMAL HIGH (ref 19–32)
Calcium: 9.7 mg/dL (ref 8.4–10.5)
Chloride: 103 mEq/L (ref 96–112)
Creatinine, Ser: 0.94 mg/dL (ref 0.50–1.10)
GFR calc Af Amer: 67 mL/min — ABNORMAL LOW (ref >90)
GFR calc non Af Amer: 58 mL/min — ABNORMAL LOW (ref >90)
Glucose, Bld: 99 mg/dL (ref 70–99)
Potassium: 4.7 mmol/L (ref 3.5–5.1)
Sodium: 143 mmol/L (ref 135–145)
Total Bilirubin: 0.8 mg/dL (ref 0.3–1.2)
Total Protein: 7.2 g/dL (ref 6.0–8.3)

## 2014-04-25 LAB — ABO/RH: ABO/RH(D): O POS

## 2014-04-25 LAB — URINALYSIS, ROUTINE W REFLEX MICROSCOPIC
Bilirubin Urine: NEGATIVE
Glucose, UA: NEGATIVE mg/dL
Hgb urine dipstick: NEGATIVE
Ketones, ur: NEGATIVE mg/dL
Nitrite: NEGATIVE
Protein, ur: NEGATIVE mg/dL
Specific Gravity, Urine: 1.007 (ref 1.005–1.030)
Urobilinogen, UA: 0.2 mg/dL (ref 0.0–1.0)
pH: 7 (ref 5.0–8.0)

## 2014-04-25 LAB — CBC
HCT: 42.3 % (ref 36.0–46.0)
Hemoglobin: 13.4 g/dL (ref 12.0–15.0)
MCH: 32.2 pg (ref 26.0–34.0)
MCHC: 31.7 g/dL (ref 30.0–36.0)
MCV: 101.7 fL — ABNORMAL HIGH (ref 78.0–100.0)
Platelets: 237 10*3/uL (ref 150–400)
RBC: 4.16 MIL/uL (ref 3.87–5.11)
RDW: 12.7 % (ref 11.5–15.5)
WBC: 8.9 10*3/uL (ref 4.0–10.5)

## 2014-04-25 LAB — URINE MICROSCOPIC-ADD ON

## 2014-04-25 LAB — SURGICAL PCR SCREEN
MRSA, PCR: NEGATIVE
Staphylococcus aureus: NEGATIVE

## 2014-04-25 LAB — PROTIME-INR
INR: 0.94 (ref 0.00–1.49)
Prothrombin Time: 12.6 seconds (ref 11.6–15.2)

## 2014-04-25 LAB — APTT: aPTT: 27 seconds (ref 24–37)

## 2014-04-25 NOTE — Progress Notes (Signed)
04/13/14- LOV with Dr Adora Fridge- preop clearance- EPIC  EKG- 04/13/2014- EPIC  CXR- 01/10/2014, 11/04/13- CT Super D chest in EPIC - 11/04/2013   04/20/2014- Stress - in EPIC  07/31/2013- PFT- EPIC  Dr Golden Circle- 12/20/2013 clearance on chart

## 2014-04-25 NOTE — Telephone Encounter (Signed)
Faxed clearance form to [employer]. Cleared by MD-see Stress test results.

## 2014-04-27 NOTE — Progress Notes (Signed)
Dr Marcell Barlow aware of patient medical history and clearance.  No new orders given.

## 2014-05-01 ENCOUNTER — Ambulatory Visit: Payer: Self-pay | Admitting: Orthopedic Surgery

## 2014-05-01 NOTE — H&P (Signed)
Leslie Hendricks DOB: [birth year] Widowed / Language: English / Race: White Female Date of Admission:  05/02/2014 CC:  Left Knee Pain History of Present Illness (Rozina Pointer L. Traxton Kolenda III PA-C; 04/04/2014 2:56 PM) The patient is a 74 year old female who comes in  for a preoperative History and Physical. The patient is scheduled for a left total knee arthroplasty to be performed by Dr. Dione Plover. Aluisio, MD at [facility] on 05-02-2014. The patient is a 74 year old female who presents  for follow up of their knee. The patient is being followed for their left knee pain. They are now several months out from cortisone injection. Symptoms reported today include: pain, stiffness, catching, instability and difficulty ambulating. The patient feels that they are doing poorly. The following medication has been used for pain control: Hydrocodone (at night) and Tylenol (during the day). The patient has not gotten any relief of their symptoms with Cortisone injections (did help for only a few days). Unfortunately the cortisone did not help beyond 2-3 days. Pain is occurring at all times. It is limiting what she can and cannot do. She is at a stage now where she cannot tolerate it anymore. She has had viscosupplements in her other knee, which did not help her prior to when she had the other knee replaced.  She is now ready to proceed with the left knee replacement. Risks and benefits of the surgery have been discussed with the patient and they elect to proceed with surgery.  There are on active contraindications to upcoming procedure such as ongoing infection or progressive neurological disease.   Problem List/Past Medical (Alexi Geibel Monika Salk, III PA-C; 04/04/2014 3:11 PM) Left knee pain (M25.562) Primary osteoarthritis of left knee (M17.12) Anxiety Disorder Diabetes Mellitus, Type I Osteoarthritis High blood pressure Gout Hypercholesterolemia Kidney Stone Vertigo Macular  Degeneration Shingles Depression Cataract Bronchitis Gastroesophageal Reflux Disease Acute Gastritis Past History Hemorrhoids Uterine Cancer Degenerative Disc Disease Measles Mumps Peripheral Edema Menopause Carotid Arterial Disease History of Lung Nodule Found on previous CXRs.  Allergies (Jarod Bozzo L Etienne Millward, III PA-C; 04/04/2014 2:54 PM) OxyCONTIN *ANALGESICS - OPIOID* Deathly sick OxyCODONE HCl ER *ANALGESICS - OPIOID* Deathly sick Codeine Phosphate *ANALGESICS - OPIOID* deathly sick Dilaudid *ANALGESICS - OPIOID* Makes her crazy  Family History (Jadriel Saxer Monika Salk, III PA-C; 04/04/2014 2:54 PM) Diabetes Mellitus Brother, Father, Mother. Cancer Mother, Paternal Jon Gills. Depression Brother. First Degree Relatives reported Heart disease in female family member before age 35 Hypertension Brother, Father, Mother. Cerebrovascular Accident Father, Maternal Grandmother, Paternal 20, Sister. 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 10/22/2013: smoke(d) 1 pack(s) per day uses 2 or more can(s) smokeless per week Never consumed alcohol 10/22/2013: Never consumed alcohol Children 0 Current work status retired [employer]  Medication History (Sterrett, III PA-C; 04/04/2014 2:54 PM) Diazepam (Oral) Specific dose unknown - Active. Vitamin C ER (1000MG  Tablet ER, Oral) Active. (1 qd) Norco (10-325MG  Tablet, Oral) Active. (1 @ HS & PRN in daytime. from spine surgeon) Effexor XR (75MG  Capsule ER  24HR, Oral) Active. Vitamin E (100UNIT Capsule, Oral) Active. Vitamin B1 (100MG  Tablet, Oral) Active. Lipitor (80MG  Tablet, Oral) Active. Valium (5MG   Tablet, Oral) Active. Hyzaar (50-12.5MG  Tablet, Oral) Active. Glucophage XR (500MG  Tablet ER 24HR, Oral) Active. Lasix (20MG  Tablet, Oral) Active. Aspirin EC (325MG  Tablet DR, Oral) Active. Caltrate 600+D Plus Minerals (600-800MG -UNIT Tablet Chewable, Oral) Active. Folic Acid (101BPZ Tablet, Oral) Active. Magnesium Oxide (400MG  Capsule, Oral) Active. Fish Oil Concentrate (1000MG  Capsule, Oral) Active.  Pregnancy / Birth History (Wiatt Mahabir Monika Salk, III PA-C; 04/04/2014 2:54 PM) Pregnant no  Past Surgical History (Debbrah Sampedro L Astryd Pearcy, III PA-C; 04/04/2014 2:54 PM) Spinal Fusion neck and lower back Neck Disc Surgery Straighten Nasal Septum Total Knee Replacement right Foot Surgery left Cataract Surgery bilateral Gallbladder Surgery open Hysterectomy complete (cancerous) Hemorrhoidectomy   Review of Systems (Arisbeth Purrington L. Ilai Hiller III PA-C; 04/04/2014 3:19 PM) General Not Present- Chills, Fatigue, Fever, Memory Loss, Night Sweats, Weight Gain and Weight Loss. Skin Not Present- Eczema, Hives, Itching, Lesions and Rash. HEENT Present- Dentures and Hearing Loss. Not Present- Double Vision, Headache, Tinnitus and Visual Loss. Respiratory Not Present- Allergies, Chronic Cough, Coughing up blood, Shortness of breath at rest and Shortness of breath with exertion. Cardiovascular Present- Palpitations and Swelling. Not Present- Chest Pain, Difficulty Breathing Lying Down, Murmur and Racing/skipping heartbeats. Gastrointestinal Present- Constipation and Heartburn. Not Present- Abdominal Pain, Bloody Stool, Diarrhea, Difficulty Swallowing, Jaundice, Loss of appetitie, Nausea and Vomiting. Female Genitourinary Present- Urinating at Night. Not Present- Blood in Urine, Discharge, Flank Pain, Incontinence, Painful Urination,  Urgency, Urinary frequency, Urinary Retention and Weak urinary stream. Musculoskeletal Present- Back Pain, Joint Pain, Joint Swelling and Spasms. Not Present- Morning Stiffness, Muscle Pain and Muscle Weakness. Neurological Present- Difficulty with balance. Not Present- Blackout spells, Dizziness, Paralysis, Tremor and Weakness. Psychiatric Not Present- Insomnia.  Vitals (Zunaira Lamy L. Zolton Dowson III PA-C; 04/04/2014 3:13 PM) 04/04/2014 3:07 PM Weight: 231 lb Height: 66in Weight was reported by patient. Height was reported by patient. Body Surface Area: 2.13 m Body Mass Index: 37.28 kg/m  BP: 142/68 (Sitting, Right Arm, Standard)   Physical Exam (Andren Bethea L. Rhett Najera III PA-C; 04/04/2014 3:19 PM) General Mental Status -Alert, cooperative and good historian. General Appearance-pleasant, Not in acute distress. Orientation-Oriented X3. Build & Nutrition-Well nourished and Well developed.  Head and Neck Head-normocephalic, atraumatic . Neck Global Assessment - supple, no bruit auscultated on the right, no bruit auscultated on the left. 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 Pupil - Bilateral-Regular and Round. Motion - Bilateral-EOMI.  Chest and Lung Exam Auscultation Breath sounds - clear at anterior chest wall and clear at posterior chest wall. Adventitious sounds - No Adventitious sounds.  Cardiovascular Auscultation Rhythm - Regular rate and rhythm. Heart Sounds - S1 WNL and S2 WNL. Murmurs & Other Heart Sounds - Auscultation of the heart reveals - No Murmurs.  Abdomen Inspection Contour - Generalized mild distention. Palpation/Percussion Tenderness - Abdomen is non-tender to palpation. Rigidity (guarding) - Abdomen is soft. Auscultation Auscultation of the abdomen reveals - Bowel sounds  normal.  Female Genitourinary Note: Not done, not pertinent to present illness   Musculoskeletal Note: Left knee with no effusion. Range of motion is about 5-120 degrees with moderate crepitus on range of motion. She has significant medial joint line tenderness. No lateral joint line tenderness. No instability noted.  RADIOGRAPHS We reviewed her radiographs again from last visit. She has severe bone-on-bone change in the medial and patellofemoral compartments of the left knee with slight varus deformity.  Assessment & Plan (Jenner Rosier L. Dara Lords  III PA-C; 04/04/2014 2:54 PM) Primary osteoarthritis of left knee (M17.12) Note:Plan is for a Left Total Knee Replacement by Dr. Wynelle Link.  Plan is to go to [employer].  PCP - Dr. Florina Ou  Topical TXA - Cancer  Signed electronically by Joelene Millin, III PA-C

## 2014-05-02 ENCOUNTER — Inpatient Hospital Stay (HOSPITAL_COMMUNITY)
Admission: RE | Admit: 2014-05-02 | Discharge: 2014-05-05 | DRG: 470 | Disposition: A | Discharge status: Skilled Nursing Facility | Payer: Medicare Other | Source: Ambulatory Visit | Attending: Orthopedic Surgery | Admitting: Orthopedic Surgery

## 2014-05-02 ENCOUNTER — Inpatient Hospital Stay (HOSPITAL_COMMUNITY): Payer: Medicare Other | Admitting: Anesthesiology

## 2014-05-02 ENCOUNTER — Encounter (HOSPITAL_COMMUNITY): Payer: Self-pay | Admitting: *Deleted

## 2014-05-02 ENCOUNTER — Encounter (HOSPITAL_COMMUNITY)
Admission: RE | Disposition: A | Discharge status: Skilled Nursing Facility | Payer: Self-pay | Source: Ambulatory Visit | Attending: Orthopedic Surgery

## 2014-05-02 DIAGNOSIS — G8929 Other chronic pain: Secondary | ICD-10-CM | POA: Diagnosis present

## 2014-05-02 DIAGNOSIS — Z96651 Presence of right artificial knee joint: Secondary | ICD-10-CM | POA: Diagnosis present

## 2014-05-02 DIAGNOSIS — Z794 Long term (current) use of insulin: Secondary | ICD-10-CM

## 2014-05-02 DIAGNOSIS — Z833 Family history of diabetes mellitus: Secondary | ICD-10-CM

## 2014-05-02 DIAGNOSIS — G2581 Restless legs syndrome: Secondary | ICD-10-CM | POA: Diagnosis present

## 2014-05-02 DIAGNOSIS — H3530 Unspecified macular degeneration: Secondary | ICD-10-CM | POA: Diagnosis present

## 2014-05-02 DIAGNOSIS — M25562 Pain in left knee: Secondary | ICD-10-CM | POA: Diagnosis present

## 2014-05-02 DIAGNOSIS — E780 Pure hypercholesterolemia: Secondary | ICD-10-CM | POA: Diagnosis present

## 2014-05-02 DIAGNOSIS — H5441 Blindness, right eye, normal vision left eye: Secondary | ICD-10-CM | POA: Diagnosis present

## 2014-05-02 DIAGNOSIS — I10 Essential (primary) hypertension: Secondary | ICD-10-CM | POA: Diagnosis present

## 2014-05-02 DIAGNOSIS — Z6837 Body mass index (BMI) 37.0-37.9, adult: Secondary | ICD-10-CM

## 2014-05-02 DIAGNOSIS — F419 Anxiety disorder, unspecified: Secondary | ICD-10-CM | POA: Diagnosis present

## 2014-05-02 DIAGNOSIS — Z8614 Personal history of Methicillin resistant Staphylococcus aureus infection: Secondary | ICD-10-CM

## 2014-05-02 DIAGNOSIS — K219 Gastro-esophageal reflux disease without esophagitis: Secondary | ICD-10-CM | POA: Diagnosis present

## 2014-05-02 DIAGNOSIS — Z7982 Long term (current) use of aspirin: Secondary | ICD-10-CM | POA: Diagnosis not present

## 2014-05-02 DIAGNOSIS — I739 Peripheral vascular disease, unspecified: Secondary | ICD-10-CM | POA: Diagnosis present

## 2014-05-02 DIAGNOSIS — E785 Hyperlipidemia, unspecified: Secondary | ICD-10-CM | POA: Diagnosis present

## 2014-05-02 DIAGNOSIS — M1712 Unilateral primary osteoarthritis, left knee: Principal | ICD-10-CM | POA: Diagnosis present

## 2014-05-02 DIAGNOSIS — M179 Osteoarthritis of knee, unspecified: Secondary | ICD-10-CM | POA: Diagnosis present

## 2014-05-02 DIAGNOSIS — Z8249 Family history of ischemic heart disease and other diseases of the circulatory system: Secondary | ICD-10-CM | POA: Diagnosis not present

## 2014-05-02 DIAGNOSIS — Z981 Arthrodesis status: Secondary | ICD-10-CM

## 2014-05-02 DIAGNOSIS — Z809 Family history of malignant neoplasm, unspecified: Secondary | ICD-10-CM | POA: Diagnosis not present

## 2014-05-02 DIAGNOSIS — F329 Major depressive disorder, single episode, unspecified: Secondary | ICD-10-CM | POA: Diagnosis present

## 2014-05-02 DIAGNOSIS — E559 Vitamin D deficiency, unspecified: Secondary | ICD-10-CM | POA: Diagnosis not present

## 2014-05-02 DIAGNOSIS — Z79899 Other long term (current) drug therapy: Secondary | ICD-10-CM

## 2014-05-02 DIAGNOSIS — E109 Type 1 diabetes mellitus without complications: Secondary | ICD-10-CM | POA: Diagnosis not present

## 2014-05-02 DIAGNOSIS — G629 Polyneuropathy, unspecified: Secondary | ICD-10-CM | POA: Diagnosis present

## 2014-05-02 DIAGNOSIS — M810 Age-related osteoporosis without current pathological fracture: Secondary | ICD-10-CM | POA: Diagnosis present

## 2014-05-02 DIAGNOSIS — Z87891 Personal history of nicotine dependence: Secondary | ICD-10-CM

## 2014-05-02 DIAGNOSIS — Z01812 Encounter for preprocedural laboratory examination: Secondary | ICD-10-CM

## 2014-05-02 DIAGNOSIS — M1710 Unilateral primary osteoarthritis, unspecified knee: Secondary | ICD-10-CM | POA: Diagnosis present

## 2014-05-02 HISTORY — PX: TOTAL KNEE ARTHROPLASTY: SHX125

## 2014-05-02 LAB — GLUCOSE, CAPILLARY
Glucose-Capillary: 106 mg/dL — ABNORMAL HIGH (ref 70–99)
Glucose-Capillary: 131 mg/dL — ABNORMAL HIGH (ref 70–99)
Glucose-Capillary: 194 mg/dL — ABNORMAL HIGH (ref 70–99)
Glucose-Capillary: 206 mg/dL — ABNORMAL HIGH (ref 70–99)

## 2014-05-02 LAB — TYPE AND SCREEN
ABO/RH(D): O POS
Antibody Screen: NEGATIVE

## 2014-05-02 SURGERY — ARTHROPLASTY, KNEE, TOTAL
Anesthesia: General | Site: Knee | Laterality: Left

## 2014-05-02 MED ORDER — LOSARTAN POTASSIUM-HCTZ 50-12.5 MG PO TABS: 1.0000 | ORAL_TABLET | Freq: Every morning | ORAL | Status: DC

## 2014-05-02 MED ORDER — CHLORHEXIDINE GLUCONATE 4 % EX LIQD: 60.0000 mL | Freq: Once | CUTANEOUS | Status: DC

## 2014-05-02 MED ORDER — ACETAMINOPHEN 650 MG RE SUPP: 650.0000 mg | Freq: Four times a day (QID) | RECTAL | Status: DC | PRN

## 2014-05-02 MED ORDER — FENTANYL CITRATE 0.05 MG/ML IJ SOLN
25.0000 ug | INTRAMUSCULAR | Status: DC | PRN
  Administered 2014-05-02 (×2): 50 ug via INTRAVENOUS

## 2014-05-02 MED ORDER — VENLAFAXINE HCL ER 75 MG PO CP24
75.0000 mg | ORAL_CAPSULE | Freq: Every day | ORAL | Status: DC
  Administered 2014-05-03 – 2014-05-05 (×3): 75 mg via ORAL
  Filled 2014-05-02 (×4): qty 1

## 2014-05-02 MED ORDER — ROCURONIUM BROMIDE 100 MG/10ML IV SOLN
INTRAVENOUS | Status: AC
  Filled 2014-05-02: qty 1

## 2014-05-02 MED ORDER — FLEET ENEMA 7-19 GM/118ML RE ENEM: 1.0000 | ENEMA | Freq: Once | RECTAL | Status: AC | PRN

## 2014-05-02 MED ORDER — CEFAZOLIN SODIUM-DEXTROSE 2-3 GM-% IV SOLR
2.0000 g | Freq: Four times a day (QID) | INTRAVENOUS | Status: AC
  Administered 2014-05-02 (×2): 2 g via INTRAVENOUS
  Filled 2014-05-02 (×2): qty 50

## 2014-05-02 MED ORDER — METFORMIN HCL ER 500 MG PO TB24
500.0000 mg | ORAL_TABLET | Freq: Every day | ORAL | Status: DC
  Administered 2014-05-02: 500 mg via ORAL
  Filled 2014-05-02 (×2): qty 1

## 2014-05-02 MED ORDER — CEFAZOLIN SODIUM-DEXTROSE 2-3 GM-% IV SOLR
INTRAVENOUS | Status: AC
  Filled 2014-05-02: qty 50

## 2014-05-02 MED ORDER — METOCLOPRAMIDE HCL 5 MG/ML IJ SOLN: 5.0000 mg | Freq: Three times a day (TID) | INTRAMUSCULAR | Status: DC | PRN

## 2014-05-02 MED ORDER — LACTATED RINGERS IV SOLN
INTRAVENOUS | Status: DC | PRN
  Administered 2014-05-02 (×2): via INTRAVENOUS

## 2014-05-02 MED ORDER — SODIUM CHLORIDE 0.9 % IV SOLN: INTRAVENOUS | Status: DC

## 2014-05-02 MED ORDER — DEXAMETHASONE SODIUM PHOSPHATE 10 MG/ML IJ SOLN
10.0000 mg | Freq: Once | INTRAMUSCULAR | Status: AC
  Administered 2014-05-02: 10 mg via INTRAVENOUS

## 2014-05-02 MED ORDER — FENTANYL CITRATE 0.05 MG/ML IJ SOLN
INTRAMUSCULAR | Status: AC
  Filled 2014-05-02: qty 2

## 2014-05-02 MED ORDER — MIDAZOLAM HCL 5 MG/5ML IJ SOLN
INTRAMUSCULAR | Status: DC | PRN
  Administered 2014-05-02 (×2): 1 mg via INTRAVENOUS

## 2014-05-02 MED ORDER — METOCLOPRAMIDE HCL 10 MG PO TABS: 5.0000 mg | ORAL_TABLET | Freq: Three times a day (TID) | ORAL | Status: DC | PRN

## 2014-05-02 MED ORDER — INSULIN ASPART 100 UNIT/ML ~~LOC~~ SOLN
0.0000 [IU] | Freq: Three times a day (TID) | SUBCUTANEOUS | Status: DC
Start: 2014-05-02 — End: 2014-05-05
  Administered 2014-05-02 – 2014-05-03 (×2): 3 [IU] via SUBCUTANEOUS
  Administered 2014-05-03: 2 [IU] via SUBCUTANEOUS
  Administered 2014-05-04: 3 [IU] via SUBCUTANEOUS

## 2014-05-02 MED ORDER — MIDAZOLAM HCL 2 MG/2ML IJ SOLN
INTRAMUSCULAR | Status: AC
  Filled 2014-05-02: qty 2

## 2014-05-02 MED ORDER — BUPIVACAINE LIPOSOME 1.3 % IJ SUSP
20.0000 mL | Freq: Once | INTRAMUSCULAR | Status: DC
  Filled 2014-05-02: qty 20

## 2014-05-02 MED ORDER — ACETAMINOPHEN 10 MG/ML IV SOLN
1000.0000 mg | Freq: Once | INTRAVENOUS | Status: AC
  Administered 2014-05-02: 1000 mg via INTRAVENOUS
  Filled 2014-05-02: qty 100

## 2014-05-02 MED ORDER — HYDROCODONE-ACETAMINOPHEN 5-325 MG PO TABS
1.0000 | ORAL_TABLET | ORAL | Status: DC | PRN
  Administered 2014-05-02 – 2014-05-03 (×8): 1 via ORAL
  Administered 2014-05-04 – 2014-05-05 (×8): 2 via ORAL
  Filled 2014-05-02: qty 1
  Filled 2014-05-02 (×2): qty 2
  Filled 2014-05-02 (×2): qty 1
  Filled 2014-05-02 (×2): qty 2
  Filled 2014-05-02: qty 1
  Filled 2014-05-02 (×3): qty 2
  Filled 2014-05-02 (×3): qty 1
  Filled 2014-05-02: qty 2
  Filled 2014-05-02: qty 1

## 2014-05-02 MED ORDER — SODIUM CHLORIDE 0.9 % IV SOLN
2000.0000 mg | INTRAVENOUS | Status: DC | PRN
  Administered 2014-05-02: 2000 mg via TOPICAL

## 2014-05-02 MED ORDER — METHOCARBAMOL 1000 MG/10ML IJ SOLN
500.0000 mg | Freq: Four times a day (QID) | INTRAVENOUS | Status: DC | PRN
  Administered 2014-05-02: 500 mg via INTRAVENOUS
  Filled 2014-05-02 (×2): qty 5

## 2014-05-02 MED ORDER — PROPOFOL 10 MG/ML IV BOLUS
INTRAVENOUS | Status: DC | PRN
  Administered 2014-05-02: 160 mg via INTRAVENOUS
  Administered 2014-05-02: 25 mg via INTRAVENOUS

## 2014-05-02 MED ORDER — MORPHINE SULFATE 10 MG/ML IJ SOLN
INTRAMUSCULAR | Status: AC
  Filled 2014-05-02: qty 1

## 2014-05-02 MED ORDER — PROPOFOL 10 MG/ML IV BOLUS
INTRAVENOUS | Status: AC
  Filled 2014-05-02: qty 20

## 2014-05-02 MED ORDER — MORPHINE SULFATE 10 MG/ML IJ SOLN
1.0000 mg | INTRAMUSCULAR | Status: DC | PRN
  Administered 2014-05-02: 2 mg via INTRAVENOUS
  Administered 2014-05-02: 1 mg via INTRAVENOUS
  Administered 2014-05-02: 2 mg via INTRAVENOUS

## 2014-05-02 MED ORDER — ONDANSETRON HCL 4 MG PO TABS: 4.0000 mg | ORAL_TABLET | Freq: Four times a day (QID) | ORAL | Status: DC | PRN

## 2014-05-02 MED ORDER — SODIUM CHLORIDE 0.9 % IJ SOLN
INTRAMUSCULAR | Status: DC | PRN
  Administered 2014-05-02: 30 mL

## 2014-05-02 MED ORDER — SODIUM CHLORIDE 0.9 % IJ SOLN
INTRAMUSCULAR | Status: AC
  Filled 2014-05-02: qty 50

## 2014-05-02 MED ORDER — PHENOL 1.4 % MT LIQD
1.0000 | OROMUCOSAL | Status: DC | PRN
  Filled 2014-05-02: qty 177

## 2014-05-02 MED ORDER — DEXAMETHASONE SODIUM PHOSPHATE 10 MG/ML IJ SOLN
INTRAMUSCULAR | Status: AC
  Filled 2014-05-02: qty 1

## 2014-05-02 MED ORDER — POLYETHYLENE GLYCOL 3350 17 G PO PACK
17.0000 g | PACK | Freq: Every day | ORAL | Status: DC | PRN
  Administered 2014-05-03 – 2014-05-04 (×2): 17 g via ORAL
  Filled 2014-05-02 (×2): qty 1

## 2014-05-02 MED ORDER — RIVAROXABAN 10 MG PO TABS
10.0000 mg | ORAL_TABLET | Freq: Every day | ORAL | Status: DC
  Administered 2014-05-03 – 2014-05-05 (×3): 10 mg via ORAL
  Filled 2014-05-02 (×4): qty 1

## 2014-05-02 MED ORDER — SODIUM CHLORIDE 0.9 % IR SOLN
Status: DC | PRN
  Administered 2014-05-02: 1000 mL

## 2014-05-02 MED ORDER — SODIUM CHLORIDE 0.9 % IV SOLN
INTRAVENOUS | Status: DC
  Administered 2014-05-02 – 2014-05-04 (×2): via INTRAVENOUS

## 2014-05-02 MED ORDER — BUPIVACAINE HCL (PF) 0.25 % IJ SOLN
INTRAMUSCULAR | Status: AC
  Filled 2014-05-02: qty 30

## 2014-05-02 MED ORDER — KETOROLAC TROMETHAMINE 15 MG/ML IJ SOLN
INTRAMUSCULAR | Status: AC
  Administered 2014-05-02: 7.5 mg via INTRAVENOUS
  Filled 2014-05-02: qty 1

## 2014-05-02 MED ORDER — LIDOCAINE HCL (CARDIAC) 20 MG/ML IV SOLN
INTRAVENOUS | Status: AC
  Filled 2014-05-02: qty 5

## 2014-05-02 MED ORDER — CEFAZOLIN SODIUM-DEXTROSE 2-3 GM-% IV SOLR
2.0000 g | INTRAVENOUS | Status: AC
  Administered 2014-05-02: 2 g via INTRAVENOUS

## 2014-05-02 MED ORDER — ONDANSETRON HCL 4 MG/2ML IJ SOLN: 4.0000 mg | Freq: Four times a day (QID) | INTRAMUSCULAR | Status: DC | PRN

## 2014-05-02 MED ORDER — ONDANSETRON HCL 4 MG/2ML IJ SOLN
INTRAMUSCULAR | Status: DC | PRN
  Administered 2014-05-02: 4 mg via INTRAVENOUS

## 2014-05-02 MED ORDER — BISACODYL 10 MG RE SUPP: 10.0000 mg | Freq: Every day | RECTAL | Status: DC | PRN

## 2014-05-02 MED ORDER — BUPIVACAINE HCL 0.25 % IJ SOLN
INTRAMUSCULAR | Status: DC | PRN
  Administered 2014-05-02: 20 mL

## 2014-05-02 MED ORDER — KETOROLAC TROMETHAMINE 15 MG/ML IJ SOLN
7.5000 mg | Freq: Four times a day (QID) | INTRAMUSCULAR | Status: AC | PRN
  Administered 2014-05-02 (×2): 7.5 mg via INTRAVENOUS
  Filled 2014-05-02: qty 1

## 2014-05-02 MED ORDER — ATORVASTATIN CALCIUM 80 MG PO TABS
80.0000 mg | ORAL_TABLET | Freq: Every day | ORAL | Status: DC
  Administered 2014-05-02 – 2014-05-04 (×3): 80 mg via ORAL
  Filled 2014-05-02 (×4): qty 1

## 2014-05-02 MED ORDER — PROMETHAZINE HCL 25 MG/ML IJ SOLN: 6.2500 mg | INTRAMUSCULAR | Status: DC | PRN

## 2014-05-02 MED ORDER — ONDANSETRON HCL 4 MG/2ML IJ SOLN
INTRAMUSCULAR | Status: AC
  Filled 2014-05-02: qty 2

## 2014-05-02 MED ORDER — HYDROCHLOROTHIAZIDE 12.5 MG PO CAPS
12.5000 mg | ORAL_CAPSULE | Freq: Every day | ORAL | Status: DC
  Administered 2014-05-02 – 2014-05-05 (×2): 12.5 mg via ORAL
  Filled 2014-05-02 (×4): qty 1

## 2014-05-02 MED ORDER — BUPIVACAINE LIPOSOME 1.3 % IJ SUSP
INTRAMUSCULAR | Status: DC | PRN
  Administered 2014-05-02: 20 mL

## 2014-05-02 MED ORDER — DIAZEPAM 5 MG PO TABS
5.0000 mg | ORAL_TABLET | Freq: Every day | ORAL | Status: DC
  Administered 2014-05-02 – 2014-05-04 (×3): 5 mg via ORAL
  Filled 2014-05-02 (×3): qty 1

## 2014-05-02 MED ORDER — TRANEXAMIC ACID 100 MG/ML IV SOLN
2000.0000 mg | Freq: Once | INTRAVENOUS | Status: DC
  Filled 2014-05-02: qty 20

## 2014-05-02 MED ORDER — DIPHENHYDRAMINE HCL 12.5 MG/5ML PO ELIX
12.5000 mg | ORAL_SOLUTION | ORAL | Status: DC | PRN
Start: 2014-05-02 — End: 2014-05-05

## 2014-05-02 MED ORDER — FUROSEMIDE 20 MG PO TABS
20.0000 mg | ORAL_TABLET | Freq: Every day | ORAL | Status: DC
  Filled 2014-05-02 (×3): qty 1

## 2014-05-02 MED ORDER — LOSARTAN POTASSIUM 50 MG PO TABS
50.0000 mg | ORAL_TABLET | Freq: Every day | ORAL | Status: DC
  Administered 2014-05-02 – 2014-05-05 (×2): 50 mg via ORAL
  Filled 2014-05-02 (×4): qty 1

## 2014-05-02 MED ORDER — FENTANYL CITRATE 0.05 MG/ML IJ SOLN
INTRAMUSCULAR | Status: DC | PRN
  Administered 2014-05-02 (×2): 50 ug via INTRAVENOUS
  Administered 2014-05-02: 25 ug via INTRAVENOUS
  Administered 2014-05-02 (×3): 50 ug via INTRAVENOUS
  Administered 2014-05-02: 25 ug via INTRAVENOUS

## 2014-05-02 MED ORDER — FENTANYL CITRATE 0.05 MG/ML IJ SOLN
INTRAMUSCULAR | Status: AC
  Filled 2014-05-02: qty 5

## 2014-05-02 MED ORDER — MENTHOL 3 MG MT LOZG
1.0000 | LOZENGE | OROMUCOSAL | Status: DC | PRN
  Filled 2014-05-02: qty 9

## 2014-05-02 MED ORDER — METHOCARBAMOL 500 MG PO TABS
500.0000 mg | ORAL_TABLET | Freq: Four times a day (QID) | ORAL | Status: DC | PRN
  Administered 2014-05-03 – 2014-05-05 (×6): 500 mg via ORAL
  Filled 2014-05-02 (×6): qty 1

## 2014-05-02 MED ORDER — ACETAMINOPHEN 325 MG PO TABS: 650.0000 mg | ORAL_TABLET | Freq: Four times a day (QID) | ORAL | Status: DC | PRN

## 2014-05-02 MED ORDER — MORPHINE SULFATE 10 MG/ML IJ SOLN: 1.0000 mg | INTRAMUSCULAR | Status: DC | PRN

## 2014-05-02 MED ORDER — DOCUSATE SODIUM 100 MG PO CAPS
100.0000 mg | ORAL_CAPSULE | Freq: Two times a day (BID) | ORAL | Status: DC
  Administered 2014-05-02 – 2014-05-05 (×6): 100 mg via ORAL

## 2014-05-02 MED ORDER — DEXAMETHASONE SODIUM PHOSPHATE 10 MG/ML IJ SOLN
10.0000 mg | Freq: Once | INTRAMUSCULAR | Status: AC
  Administered 2014-05-03: 10 mg via INTRAVENOUS
  Filled 2014-05-02: qty 1

## 2014-05-02 SURGICAL SUPPLY — 58 items
BAG ZIPLOCK 12X15 (MISCELLANEOUS) ×2 IMPLANT
BANDAGE ELASTIC 6 VELCRO ST LF (GAUZE/BANDAGES/DRESSINGS) ×2 IMPLANT
BANDAGE ESMARK 6X9 LF (GAUZE/BANDAGES/DRESSINGS) ×1 IMPLANT
BLADE SAG 18X100X1.27 (BLADE) ×2 IMPLANT
BLADE SAW SGTL 11.0X1.19X90.0M (BLADE) ×2 IMPLANT
BNDG ESMARK 6X9 LF (GAUZE/BANDAGES/DRESSINGS) ×2
BOWL SMART MIX CTS (DISPOSABLE) ×2 IMPLANT
CAP KNEE TOTAL 3 SIGMA ×2 IMPLANT
CEMENT HV SMART SET (Cement) ×4 IMPLANT
CUFF TOURN SGL QUICK 34 (TOURNIQUET CUFF) ×1
CUFF TRNQT CYL 34X4X40X1 (TOURNIQUET CUFF) ×1 IMPLANT
DECANTER SPIKE VIAL GLASS SM (MISCELLANEOUS) ×2 IMPLANT
DRAPE EXTREMITY T 121X128X90 (DRAPE) ×2 IMPLANT
DRAPE POUCH INSTRU U-SHP 10X18 (DRAPES) ×2 IMPLANT
DRAPE U-SHAPE 47X51 STRL (DRAPES) ×2 IMPLANT
DRSG ADAPTIC 3X8 NADH LF (GAUZE/BANDAGES/DRESSINGS) ×2 IMPLANT
DRSG PAD ABDOMINAL 8X10 ST (GAUZE/BANDAGES/DRESSINGS) IMPLANT
DURAPREP 26ML APPLICATOR (WOUND CARE) ×2 IMPLANT
ELECT REM PT RETURN 9FT ADLT (ELECTROSURGICAL) ×2
ELECTRODE REM PT RTRN 9FT ADLT (ELECTROSURGICAL) ×1 IMPLANT
EVACUATOR 1/8 PVC DRAIN (DRAIN) ×2 IMPLANT
FACESHIELD WRAPAROUND (MASK) ×10 IMPLANT
GAUZE SPONGE 4X4 12PLY STRL (GAUZE/BANDAGES/DRESSINGS) ×2 IMPLANT
GLOVE BIO SURGEON STRL SZ7.5 (GLOVE) IMPLANT
GLOVE BIO SURGEON STRL SZ8 (GLOVE) ×2 IMPLANT
GLOVE BIOGEL PI IND STRL 6.5 (GLOVE) IMPLANT
GLOVE BIOGEL PI IND STRL 8 (GLOVE) ×1 IMPLANT
GLOVE BIOGEL PI INDICATOR 6.5 (GLOVE)
GLOVE BIOGEL PI INDICATOR 8 (GLOVE) ×1
GLOVE SURG SS PI 6.5 STRL IVOR (GLOVE) IMPLANT
GOWN STRL REUS W/TWL LRG LVL3 (GOWN DISPOSABLE) ×2 IMPLANT
GOWN STRL REUS W/TWL XL LVL3 (GOWN DISPOSABLE) IMPLANT
HANDPIECE INTERPULSE COAX TIP (DISPOSABLE) ×1
IMMOBILIZER KNEE 20 (SOFTGOODS) ×2 IMPLANT
IMMOBILIZER KNEE 20 THIGH 36 (SOFTGOODS) IMPLANT
KIT BASIN OR (CUSTOM PROCEDURE TRAY) ×2 IMPLANT
MANIFOLD NEPTUNE II (INSTRUMENTS) ×2 IMPLANT
NDL SAFETY ECLIPSE 18X1.5 (NEEDLE) ×2 IMPLANT
NEEDLE HYPO 18GX1.5 SHARP (NEEDLE) ×2
NS IRRIG 1000ML POUR BTL (IV SOLUTION) ×2 IMPLANT
PACK TOTAL JOINT (CUSTOM PROCEDURE TRAY) ×2 IMPLANT
PAD ABD 8X10 STRL (GAUZE/BANDAGES/DRESSINGS) ×2 IMPLANT
PADDING CAST COTTON 6X4 STRL (CAST SUPPLIES) ×2 IMPLANT
POSITIONER SURGICAL ARM (MISCELLANEOUS) ×2 IMPLANT
SET HNDPC FAN SPRY TIP SCT (DISPOSABLE) ×1 IMPLANT
STRIP CLOSURE SKIN 1/2X4 (GAUZE/BANDAGES/DRESSINGS) ×2 IMPLANT
SUCTION FRAZIER 12FR DISP (SUCTIONS) ×2 IMPLANT
SUT MNCRL AB 4-0 PS2 18 (SUTURE) ×2 IMPLANT
SUT VIC AB 2-0 CT1 27 (SUTURE) ×3
SUT VIC AB 2-0 CT1 TAPERPNT 27 (SUTURE) ×3 IMPLANT
SUT VLOC 180 0 24IN GS25 (SUTURE) ×2 IMPLANT
SYR 20CC LL (SYRINGE) ×2 IMPLANT
SYR 50ML LL SCALE MARK (SYRINGE) ×2 IMPLANT
TOWEL OR 17X26 10 PK STRL BLUE (TOWEL DISPOSABLE) ×2 IMPLANT
TOWEL OR NON WOVEN STRL DISP B (DISPOSABLE) IMPLANT
TRAY FOLEY CATH 14FRSI W/METER (CATHETERS) ×2 IMPLANT
WATER STERILE IRR 1500ML POUR (IV SOLUTION) ×2 IMPLANT
WRAP KNEE MAXI GEL POST OP (GAUZE/BANDAGES/DRESSINGS) ×2 IMPLANT

## 2014-05-02 NOTE — Anesthesia Postprocedure Evaluation (Signed)
  Anesthesia Post-op Note  Patient: Leslie Hendricks  Procedure(s) Performed: Procedure(s) (LRB): LEFT TOTAL KNEE ARTHROPLASTY (Left)  Patient Location: PACU  Anesthesia Type: General  Level of Consciousness: awake and alert   Airway and Oxygen Therapy: Patient Spontanous Breathing  Post-op Pain: mild  Post-op Assessment: Post-op Vital signs reviewed, Patient's Cardiovascular Status Stable, Respiratory Function Stable, Patent Airway and No signs of Nausea or vomiting  Last Vitals:  Filed Vitals:   05/02/14 1330  BP: 119/48  Pulse: 65  Temp:   Resp: 13    Post-op Vital Signs: stable   Complications: No apparent anesthesia complications

## 2014-05-02 NOTE — Transfer of Care (Signed)
Immediate Anesthesia Transfer of Care Note  Patient: Leslie Hendricks  Procedure(s) Performed: Procedure(s): LEFT TOTAL KNEE ARTHROPLASTY (Left)  Patient Location: PACU  Anesthesia Type:General  Level of Consciousness: awake, alert  and oriented  Airway & Oxygen Therapy: Patient Spontanous Breathing and Patient connected to face mask oxygen  Post-op Assessment: Report given to PACU RN and Post -op Vital signs reviewed and stable  Post vital signs: Reviewed and stable  Complications: No apparent anesthesia complications

## 2014-05-02 NOTE — H&P (View-Only) (Signed)
Leslie Hendricks DOB: [birth year] Widowed / Language: English / Race: White Female Date of Admission:  05/02/2014 CC:  Left Knee Pain History of Present Illness (Alexzandrew L. Perkins III PA-C; 04/04/2014 2:56 PM) The patient is a 74 year old female who comes in  for a preoperative History and Physical. The patient is scheduled for a left total knee arthroplasty to be performed by Dr. Dione Plover. Aluisio, MD at [facility] on 05-02-2014. The patient is a 74 year old female who presents  for follow up of their knee. The patient is being followed for their left knee pain. They are now several months out from cortisone injection. Symptoms reported today include: pain, stiffness, catching, instability and difficulty ambulating. The patient feels that they are doing poorly. The following medication has been used for pain control: Hydrocodone (at night) and Tylenol (during the day). The patient has not gotten any relief of their symptoms with Cortisone injections (did help for only a few days). Unfortunately the cortisone did not help beyond 2-3 days. Pain is occurring at all times. It is limiting what she can and cannot do. She is at a stage now where she cannot tolerate it anymore. She has had viscosupplements in her other knee, which did not help her prior to when she had the other knee replaced.  She is now ready to proceed with the left knee replacement. Risks and benefits of the surgery have been discussed with the patient and they elect to proceed with surgery.  There are on active contraindications to upcoming procedure such as ongoing infection or progressive neurological disease.   Problem List/Past Medical (Alexzandrew Monika Salk, III PA-C; 04/04/2014 3:11 PM) Left knee pain (M25.562) Primary osteoarthritis of left knee (M17.12) Anxiety Disorder Diabetes Mellitus, Type I Osteoarthritis High blood pressure Gout Hypercholesterolemia Kidney Stone Vertigo Macular  Degeneration Shingles Depression Cataract Bronchitis Gastroesophageal Reflux Disease Acute Gastritis Past History Hemorrhoids Uterine Cancer Degenerative Disc Disease Measles Mumps Peripheral Edema Menopause Carotid Arterial Disease History of Lung Nodule Found on previous CXRs.  Allergies (Alexzandrew L Perkins, III PA-C; 04/04/2014 2:54 PM) OxyCONTIN *ANALGESICS - OPIOID* Deathly sick OxyCODONE HCl ER *ANALGESICS - OPIOID* Deathly sick Codeine Phosphate *ANALGESICS - OPIOID* deathly sick Dilaudid *ANALGESICS - OPIOID* Makes her crazy  Family History (Alexzandrew Monika Salk, III PA-C; 04/04/2014 2:54 PM) Diabetes Mellitus Brother, Father, Mother. Cancer Mother, Paternal Jon Gills. Depression Brother. First Degree Relatives reported Heart disease in female family member before age 35 Hypertension Brother, Father, Mother. Cerebrovascular Accident Father, Maternal Grandmother, Paternal 20, Sister. 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 10/22/2013: smoke(d) 1 pack(s) per day uses 2 or more can(s) smokeless per week Never consumed alcohol 10/22/2013: Never consumed alcohol Children 0 Current work status retired [employer]  Medication History (Sterrett, III PA-C; 04/04/2014 2:54 PM) Diazepam (Oral) Specific dose unknown - Active. Vitamin C ER (1000MG  Tablet ER, Oral) Active. (1 qd) Norco (10-325MG  Tablet, Oral) Active. (1 @ HS & PRN in daytime. from spine surgeon) Effexor XR (75MG  Capsule ER  24HR, Oral) Active. Vitamin E (100UNIT Capsule, Oral) Active. Vitamin B1 (100MG  Tablet, Oral) Active. Lipitor (80MG  Tablet, Oral) Active. Valium (5MG   Tablet, Oral) Active. Hyzaar (50-12.5MG  Tablet, Oral) Active. Glucophage XR (500MG  Tablet ER 24HR, Oral) Active. Lasix (20MG  Tablet, Oral) Active. Aspirin EC (325MG  Tablet DR, Oral) Active. Caltrate 600+D Plus Minerals (600-800MG -UNIT Tablet Chewable, Oral) Active. Folic Acid (101BPZ Tablet, Oral) Active. Magnesium Oxide (400MG  Capsule, Oral) Active. Fish Oil Concentrate (1000MG  Capsule, Oral) Active.  Pregnancy / Birth History (Alexzandrew Monika Salk, III PA-C; 04/04/2014 2:54 PM) Pregnant no  Past Surgical History (Alexzandrew L Perkins, III PA-C; 04/04/2014 2:54 PM) Spinal Fusion neck and lower back Neck Disc Surgery Straighten Nasal Septum Total Knee Replacement right Foot Surgery left Cataract Surgery bilateral Gallbladder Surgery open Hysterectomy complete (cancerous) Hemorrhoidectomy   Review of Systems (Alexzandrew L. Perkins III PA-C; 04/04/2014 3:19 PM) General Not Present- Chills, Fatigue, Fever, Memory Loss, Night Sweats, Weight Gain and Weight Loss. Skin Not Present- Eczema, Hives, Itching, Lesions and Rash. HEENT Present- Dentures and Hearing Loss. Not Present- Double Vision, Headache, Tinnitus and Visual Loss. Respiratory Not Present- Allergies, Chronic Cough, Coughing up blood, Shortness of breath at rest and Shortness of breath with exertion. Cardiovascular Present- Palpitations and Swelling. Not Present- Chest Pain, Difficulty Breathing Lying Down, Murmur and Racing/skipping heartbeats. Gastrointestinal Present- Constipation and Heartburn. Not Present- Abdominal Pain, Bloody Stool, Diarrhea, Difficulty Swallowing, Jaundice, Loss of appetitie, Nausea and Vomiting. Female Genitourinary Present- Urinating at Night. Not Present- Blood in Urine, Discharge, Flank Pain, Incontinence, Painful Urination,  Urgency, Urinary frequency, Urinary Retention and Weak urinary stream. Musculoskeletal Present- Back Pain, Joint Pain, Joint Swelling and Spasms. Not Present- Morning Stiffness, Muscle Pain and Muscle Weakness. Neurological Present- Difficulty with balance. Not Present- Blackout spells, Dizziness, Paralysis, Tremor and Weakness. Psychiatric Not Present- Insomnia.  Vitals (Alexzandrew L. Perkins III PA-C; 04/04/2014 3:13 PM) 04/04/2014 3:07 PM Weight: 231 lb Height: 66in Weight was reported by patient. Height was reported by patient. Body Surface Area: 2.13 m Body Mass Index: 37.28 kg/m  BP: 142/68 (Sitting, Right Arm, Standard)   Physical Exam (Alexzandrew L. Perkins III PA-C; 04/04/2014 3:19 PM) General Mental Status -Alert, cooperative and good historian. General Appearance-pleasant, Not in acute distress. Orientation-Oriented X3. Build & Nutrition-Well nourished and Well developed.  Head and Neck Head-normocephalic, atraumatic . Neck Global Assessment - supple, no bruit auscultated on the right, no bruit auscultated on the left. 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 Pupil - Bilateral-Regular and Round. Motion - Bilateral-EOMI.  Chest and Lung Exam Auscultation Breath sounds - clear at anterior chest wall and clear at posterior chest wall. Adventitious sounds - No Adventitious sounds.  Cardiovascular Auscultation Rhythm - Regular rate and rhythm. Heart Sounds - S1 WNL and S2 WNL. Murmurs & Other Heart Sounds - Auscultation of the heart reveals - No Murmurs.  Abdomen Inspection Contour - Generalized mild distention. Palpation/Percussion Tenderness - Abdomen is non-tender to palpation. Rigidity (guarding) - Abdomen is soft. Auscultation Auscultation of the abdomen reveals - Bowel sounds  normal.  Female Genitourinary Note: Not done, not pertinent to present illness   Musculoskeletal Note: Left knee with no effusion. Range of motion is about 5-120 degrees with moderate crepitus on range of motion. She has significant medial joint line tenderness. No lateral joint line tenderness. No instability noted.  RADIOGRAPHS We reviewed her radiographs again from last visit. She has severe bone-on-bone change in the medial and patellofemoral compartments of the left knee with slight varus deformity.  Assessment & Plan (Alexzandrew L. Dara Lords  III PA-C; 04/04/2014 2:54 PM) Primary osteoarthritis of left knee (M17.12) Note:Plan is for a Left Total Knee Replacement by Dr. Wynelle Link.  Plan is to go to [employer].  PCP - Dr. Florina Ou  Topical TXA - Cancer  Signed electronically by Joelene Millin, III PA-C

## 2014-05-02 NOTE — Progress Notes (Signed)
Utilization review completed.  

## 2014-05-02 NOTE — Anesthesia Preprocedure Evaluation (Addendum)
Anesthesia Evaluation  Patient identified by MRN, date of birth, ID band Patient awake    Reviewed: Allergy & Precautions, NPO status , Patient's Chart, lab work & pertinent test results  History of Anesthesia Complications Negative for: history of anesthetic complications  Airway Mallampati: II  TM Distance: <3 FB Neck ROM: Limited    Dental no notable dental hx.    Pulmonary neg pulmonary ROS, former smoker,  breath sounds clear to auscultation  Pulmonary exam normal       Cardiovascular hypertension, + Peripheral Vascular Disease Rhythm:Regular Rate:Normal     Neuro/Psych PSYCHIATRIC DISORDERS Anxiety Depression  Neuromuscular disease negative neurological ROS  negative psych ROS   GI/Hepatic Neg liver ROS, GERD-  Medicated,  Endo/Other  diabetesMorbid obesity  Renal/GU negative Renal ROS  negative genitourinary   Musculoskeletal negative musculoskeletal ROS (+)   Abdominal   Peds negative pediatric ROS (+)  Hematology negative hematology ROS (+)   Anesthesia Other Findings   Reproductive/Obstetrics negative OB ROS                          Anesthesia Physical Anesthesia Plan  ASA: III  Anesthesia Plan: General   Post-op Pain Management:    Induction: Intravenous  Airway Management Planned: Oral ETT  Additional Equipment:   Intra-op Plan:   Post-operative Plan:   Informed Consent: I have reviewed the patients History and Physical, chart, labs and discussed the procedure including the risks, benefits and alternatives for the proposed anesthesia with the patient or authorized representative who has indicated his/her understanding and acceptance.   Dental advisory given  Plan Discussed with: CRNA and Surgeon  Anesthesia Plan Comments: (Previous back surgery prevents SAB)      Anesthesia Quick Evaluation

## 2014-05-02 NOTE — Op Note (Signed)
Pre-operative diagnosis- Osteoarthritis  Left knee(s)  Post-operative diagnosis- Osteoarthritis Left knee(s)  Procedure-  Left  Total Knee Arthroplasty  Surgeon- Dione Plover. Citlalli Weikel, MD  Assistant- Arlee Muslim, PA-C   Anesthesia-  General  EBL-* No blood loss amount entered *   Drains Hemovac  Tourniquet time-  Total Tourniquet Time Documented: Thigh (Left) - 8 minutes Total: Thigh (Left) - 8 minutes     Complications- None  Condition-PACU - hemodynamically stable.   Brief Clinical Note  Leslie Hendricks is a 74 y.o. year old female with end stage OA of her left knee with progressively worsening pain and dysfunction. She has constant pain, with activity and at rest and significant functional deficits with difficulties even with ADLs. She has had extensive non-op management including analgesics, injections of cortisone, and home exercise program, but remains in significant pain with significant dysfunction. Radiographs show bone on bone arthritis medial and patellofemoral. She presents now for left Total Knee Arthroplasty.    Procedure in detail---   The patient is brought into the operating room and positioned supine on the operating table. After successful administration of  General,   a tourniquet is placed high on the  Left thigh(s) and the lower extremity is prepped and draped in the usual sterile fashion. Time out is performed by the operating team and then the  Left lower extremity is wrapped in Esmarch, knee flexed and the tourniquet inflated to 300 mmHg.       A midline incision is made with a ten blade through the subcutaneous tissue to the level of the extensor mechanism. A fresh blade is used to make a medial parapatellar arthrotomy. Soft tissue over the proximal medial tibia is subperiosteally elevated to the joint line with a knife and into the semimembranosus bursa with a Cobb elevator. Soft tissue over the proximal lateral tibia is elevated with attention being paid to  avoiding the patellar tendon on the tibial tubercle. The patella is everted, knee flexed 90 degrees and the ACL and PCL are removed. Findings are bone on bone all 3 compartments with large global osteophytes.        The drill is used to create a starting hole in the distal femur and the canal is thoroughly irrigated with sterile saline to remove the fatty contents. The 5 degree Left  valgus alignment guide is placed into the femoral canal and the distal femoral cutting block is pinned to remove 10 mm off the distal femur. Resection is made with an oscillating saw.      The tibia is subluxed forward and the menisci are removed. The extramedullary alignment guide is placed referencing proximally at the medial aspect of the tibial tubercle and distally along the second metatarsal axis and tibial crest. The block is pinned to remove 33mm off the more deficient lateral  side. Resection is made with an oscillating saw. Size 3is the most appropriate size for the tibia and the proximal tibia is prepared with the modular drill and keel punch for that size. The tourniquet was then released after 8 minutes as it was not functioning and was causing venous bleeding.      The femoral sizing guide is placed and size 4 is most appropriate. Rotation is marked off the epicondylar axis and confirmed by creating a rectangular flexion gap at 90 degrees. The size 4 cutting block is pinned in this rotation and the anterior, posterior and chamfer cuts are made with the oscillating saw. The intercondylar block is then  placed and that cut is made.      Trial size 3 tibial component, trial size 4 posterior stabilized femur and a 10  mm posterior stabilized rotating platform insert trial is placed. Full extension is achieved with excellent varus/valgus and anterior/posterior balance throughout full range of motion. The patella is everted and thickness measured to be 22  mm. Free hand resection is taken to 12 mm, a 35 template is placed, lug  holes are drilled, trial patella is placed, and it tracks normally. Osteophytes are removed off the posterior femur with the trial in place. All trials are removed and the cut bone surfaces prepared with pulsatile lavage. Cement is mixed and once ready for implantation, the size 3 tibial implant, size  4 narrow posterior stabilized femoral component, and the size 35 patella are cemented in place and the patella is held with the clamp. The trial insert is placed and the knee held in full extension. The Exparel (20 ml mixed with 30 ml saline) and .25% Bupivicaine, are injected into the extensor mechanism, posterior capsule, medial and lateral gutters and subcutaneous tissues.  All extruded cement is removed and once the cement is hard the permanent 10 mm posterior stabilized rotating platform insert is placed into the tibial tray.      The wound is copiously irrigated with saline solution and the extensor mechanism closed over a hemovac drain with #1 V-loc suture. Flexion against gravity is 135 degrees and the patella tracks normally. Subcutaneous tissue is closed with 2.0 vicryl and subcuticular with running 4.0 Monocryl. The incision is cleaned and dried and steri-strips and a bulky sterile dressing are applied. The limb is placed into a knee immobilizer and the patient is awakened and transported to recovery in stable condition.      Please note that a surgical assistant was a medical necessity for this procedure in order to perform it in a safe and expeditious manner. Surgical assistant was necessary to retract the ligaments and vital neurovascular structures to prevent injury to them and also necessary for proper positioning of the limb to allow for anatomic placement of the prosthesis.   Dione Plover Arthur Speagle, MD    05/02/2014, 11:19 AM

## 2014-05-02 NOTE — Interval H&P Note (Signed)
History and Physical Interval Note:  05/02/2014 7:13 AM  Leslie Hendricks  has presented today for surgery, with the diagnosis of left knee osteoarthritis  The various methods of treatment have been discussed with the patient and family. After consideration of risks, benefits and other options for treatment, the patient has consented to  Procedure(s): LEFT TOTAL KNEE ARTHROPLASTY (Left) as a surgical intervention .  The patient's history has been reviewed, patient examined, no change in status, stable for surgery.  I have reviewed the patient's chart and labs.  Questions were answered to the patient's satisfaction.     Gearlean Alf

## 2014-05-03 ENCOUNTER — Encounter (HOSPITAL_COMMUNITY): Payer: Self-pay | Admitting: Orthopedic Surgery

## 2014-05-03 LAB — BASIC METABOLIC PANEL
Anion gap: 5 (ref 5–15)
BUN: 19 mg/dL (ref 6–23)
CO2: 32 mmol/L (ref 19–32)
Calcium: 8.3 mg/dL — ABNORMAL LOW (ref 8.4–10.5)
Chloride: 103 mEq/L (ref 96–112)
Creatinine, Ser: 0.95 mg/dL (ref 0.50–1.10)
GFR calc Af Amer: 66 mL/min — ABNORMAL LOW (ref >90)
GFR calc non Af Amer: 57 mL/min — ABNORMAL LOW (ref >90)
Glucose, Bld: 149 mg/dL — ABNORMAL HIGH (ref 70–99)
Potassium: 3.9 mmol/L (ref 3.5–5.1)
Sodium: 140 mmol/L (ref 135–145)

## 2014-05-03 LAB — CBC
HCT: 28.5 % — ABNORMAL LOW (ref 36.0–46.0)
Hemoglobin: 9.4 g/dL — ABNORMAL LOW (ref 12.0–15.0)
MCH: 33 pg (ref 26.0–34.0)
MCHC: 33 g/dL (ref 30.0–36.0)
MCV: 100 fL (ref 78.0–100.0)
Platelets: 169 10*3/uL (ref 150–400)
RBC: 2.85 MIL/uL — ABNORMAL LOW (ref 3.87–5.11)
RDW: 12.4 % (ref 11.5–15.5)
WBC: 13.1 10*3/uL — ABNORMAL HIGH (ref 4.0–10.5)

## 2014-05-03 LAB — GLUCOSE, CAPILLARY
Glucose-Capillary: 140 mg/dL — ABNORMAL HIGH (ref 70–99)
Glucose-Capillary: 123 mg/dL — ABNORMAL HIGH (ref 70–99)
Glucose-Capillary: 181 mg/dL — ABNORMAL HIGH (ref 70–99)
Glucose-Capillary: 133 mg/dL — ABNORMAL HIGH (ref 70–99)

## 2014-05-03 MED ORDER — POLYVINYL ALCOHOL 1.4 % OP SOLN
2.0000 [drp] | Freq: Four times a day (QID) | OPHTHALMIC | Status: DC
  Administered 2014-05-03 – 2014-05-04 (×5): 2 [drp] via OPHTHALMIC
  Filled 2014-05-03: qty 15

## 2014-05-03 MED ORDER — MORPHINE SULFATE 2 MG/ML IJ SOLN
1.0000 mg | INTRAMUSCULAR | Status: DC | PRN
  Administered 2014-05-03: 1 mg via INTRAVENOUS
  Filled 2014-05-03: qty 1

## 2014-05-03 MED ORDER — POLYSACCHARIDE IRON COMPLEX 150 MG PO CAPS
150.0000 mg | ORAL_CAPSULE | Freq: Every day | ORAL | Status: DC
  Administered 2014-05-03 – 2014-05-05 (×3): 150 mg via ORAL
  Filled 2014-05-03 (×3): qty 1

## 2014-05-03 MED ORDER — SODIUM CHLORIDE 0.9 % IV BOLUS (SEPSIS)
500.0000 mL | Freq: Once | INTRAVENOUS | Status: AC
  Administered 2014-05-03: 500 mL via INTRAVENOUS

## 2014-05-03 NOTE — Progress Notes (Signed)
Noted pt is to d/c to SNF. Will defer this OT eval to next venue.   Jinger Neighbors, [place] [phone]

## 2014-05-03 NOTE — Evaluation (Signed)
Physical Therapy Evaluation Patient Details Name: Leslie Hendricks MRN: [id number] DOB: 1940-10-14 Today's Date: 05/03/2014   History of Present Illness  Pt is a 74 year old female s/p L TKA with hx of back surgeries, cervical spine surgeries, and R TKA.  Clinical Impression  Pt is s/p L TKA resulting in the deficits listed below (see PT Problem List).  Pt will benefit from skilled PT to increase their independence and safety with mobility to allow discharge to the venue listed below.  Pt slowly mobilizing today due to increased pain.  Pt plans to d/c to SNF.       Follow Up Recommendations SNF    Equipment Recommendations  None recommended by PT    Recommendations for Other Services       Precautions / Restrictions Precautions Precautions: Fall;Knee Required Braces or Orthoses: Knee Immobilizer - Left Knee Immobilizer - Left: Discontinue once straight leg raise with < 10 degree lag Restrictions Weight Bearing Restrictions: No Other Position/Activity Restrictions: WBAT      Mobility  Bed Mobility Overal bed mobility: Needs Assistance Bed Mobility: Supine to Sit     Supine to sit: Min assist;HOB elevated     General bed mobility comments: verbal cues for technique, assist for L LE, increased time and effort for pt to self assist, utilized bed rail  Transfers Overall transfer level: Needs assistance Equipment used: Rolling walker (2 wheeled) Transfers: Sit to/from [employer] Sit to Stand: Mod assist Stand pivot transfers: Min assist       General transfer comment: verbal cues for safe technique, increased pain with limited steps to recliner, therefore did not ambulate as pt felt unable to tolerate at this time  Ambulation/Gait                Stairs            Wheelchair Mobility    Modified Rankin (Stroke Patients Only)       Balance                                             Pertinent Vitals/Pain Pain  Assessment: 0-10 Pain Score: 10-Worst pain ever Pain Location: L knee during mobility Pain Descriptors / Indicators: Sore;Aching Pain Intervention(s): Limited activity within patient's tolerance;Monitored during session;Repositioned;Ice applied    Home Living Family/patient expects to be discharged to:: Skilled nursing facility Living Arrangements: Alone                    Prior Function Level of Independence: Independent with assistive device(s)         Comments: uses RW all the time     Hand Dominance        Extremity/Trunk Assessment               Lower Extremity Assessment: LLE deficits/detail   LLE Deficits / Details: fair quad contraction, unable to perform SLR, AAROM knee flexion approx 35* limited by pain     Communication   Communication: No difficulties  Cognition Arousal/Alertness: Awake/alert Behavior During Therapy: WFL for tasks assessed/performed Overall Cognitive Status: Within Functional Limits for tasks assessed                      General Comments      Exercises Total Joint Exercises Ankle Circles/Pumps: AROM;Both;15 reps Quad Sets: AROM;Both;10 reps Towel Squeeze:  AROM;Both;10 reps Short Arc QuadBarbaraann Boys;Left;10 reps Heel Slides: AAROM;Left;10 reps Hip ABduction/ADduction: AROM;Left;10 reps Straight Leg Raises: AAROM;Left;10 reps      Assessment/Plan    PT Assessment Patient needs continued PT services  PT Diagnosis Difficulty walking;Acute pain   PT Problem List Decreased strength;Decreased range of motion;Decreased mobility;Decreased knowledge of precautions;Decreased knowledge of use of DME;Pain  PT Treatment Interventions Gait training;DME instruction;Functional mobility training;Patient/family education;Therapeutic activities;Therapeutic exercise   PT Goals (Current goals can be found in the Care Plan section) Acute Rehab PT Goals PT Goal Formulation: With patient Time For Goal Achievement:  05/06/14 Potential to Achieve Goals: Good    Frequency 7X/week   Barriers to discharge        Co-evaluation               End of Session Equipment Utilized During Treatment: Gait belt;Left knee immobilizer Activity Tolerance: Patient limited by pain Patient left: in chair;with call bell/phone within reach           Time: 1610-9604 PT Time Calculation (min) (ACUTE ONLY): 25 min   Charges:   PT Evaluation $Initial PT Evaluation Tier I: 1 Procedure PT Treatments $Therapeutic Exercise: 8-22 mins $Therapeutic Activity: 8-22 mins   PT G Codes:        Camylle Whicker,KATHrine E 05/03/2014, 12:06 PM Zenovia Jarred, PT, DPT 05/03/2014 Pager: [phone]
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
## 2014-05-03 NOTE — Discharge Instructions (Addendum)
° °Dr. Frank Aluisio °Total Joint Specialist °[facility] °[address] °[place], [place] [address] °[phone] ° °TOTAL KNEE REPLACEMENT POSTOPERATIVE DIRECTIONS ° ° ° °Knee Rehabilitation, Guidelines Following Surgery  °Results after knee surgery are often greatly improved when you follow the exercise, range of motion and muscle strengthening exercises prescribed by your doctor. Safety measures are also important to protect the knee from further injury. Any time any of these exercises cause you to have increased pain or swelling in your knee joint, decrease the amount until you are comfortable again and slowly increase them. If you have problems or questions, call your caregiver or physical therapist for advice.  ° °HOME CARE INSTRUCTIONS  °Remove items at home which could result in a fall. This includes throw rugs or furniture in walking pathways.  °Continue medications as instructed at time of discharge. °You may have some home medications which will be placed on hold until you complete the course of blood thinner medication.  °You may start showering once you are discharged home but do not submerge the incision under water. Just pat the incision dry and apply a dry gauze dressing on daily. °Walk with walker as instructed.  °You may resume a sexual relationship in one month or when given the OK by  your doctor.  °· Use walker as long as suggested by your caregivers. °· Avoid periods of inactivity such as sitting longer than an hour when not asleep. This helps prevent blood clots.  °You may put full weight on your legs and walk as much as is comfortable.  °You may return to work once you are cleared by your doctor.  °Do not drive a car for 6 weeks or until released by you surgeon.  °· Do not drive while taking narcotics.  °Wear the elastic stockings for three weeks following surgery during the day but you may remove then at night. °Make sure you keep all of your appointments after your  operation with all of your doctors and caregivers. You should call the office at the above phone number and make an appointment for approximately two weeks after the date of your surgery. °Change the dressing daily and reapply a dry dressing each time. °Please pick up a stool softener and laxative for home use as long as you are requiring pain medications. °· ICE to the affected knee every three hours for 30 minutes at a time and then as needed for pain and swelling.  Continue to use ice on the knee for pain and swelling from surgery. You may notice swelling that will progress down to the foot and ankle.  This is normal after surgery.  Elevate the leg when you are not up walking on it.   °It is important for you to complete the blood thinner medication as prescribed by your doctor. °· Continue to use the breathing machine which will help keep your temperature down.  It is common for your temperature to cycle up and down following surgery, especially at night when you are not up moving around and exerting yourself.  The breathing machine keeps your lungs expanded and your temperature down. ° °RANGE OF MOTION AND STRENGTHENING EXERCISES  °Rehabilitation of the knee is important following a knee injury or an operation. After just a few days of immobilization, the muscles of the thigh which control the knee become weakened and shrink (atrophy). Knee exercises are designed to build up the tone and strength of the thigh muscles and to improve knee   motion. Often times heat used for twenty to thirty minutes before working out will loosen up your tissues and help with improving the range of motion but do not use heat for the first two weeks following surgery. These exercises can be done on a training (exercise) mat, on the floor, on a table or on a bed. Use what ever works the best and is most comfortable for you Knee exercises include:  Leg Lifts - While your knee is still immobilized in a splint or cast, you can do  straight leg raises. Lift the leg to 60 degrees, hold for 3 sec, and slowly lower the leg. Repeat 10-20 times 2-3 times daily. Perform this exercise against resistance later as your knee gets better.  Quad and Hamstring Sets - Tighten up the muscle on the front of the thigh (Quad) and hold for 5-10 sec. Repeat this 10-20 times hourly. Hamstring sets are done by pushing the foot backward against an object and holding for 5-10 sec. Repeat as with quad sets.  A rehabilitation program following serious knee injuries can speed recovery and prevent re-injury in the future due to weakened muscles. Contact your doctor or a physical therapist for more information on knee rehabilitation.   SKILLED REHAB INSTRUCTIONS: If the patient is transferred to a skilled rehab facility following release from the hospital, a list of the current medications will be sent to the facility for the patient to continue.  When discharged from the skilled rehab facility, please have the facility set up the patient's [place] prior to being released. Also, the skilled facility will be responsible for providing the patient with their medications at time of release from the facility to include their pain medication, the muscle relaxants, and their blood thinner medication. If the patient is still at the rehab facility at time of the two week follow up appointment, the skilled rehab facility will also need to assist the patient in arranging follow up appointment in our office and any transportation needs.  MAKE SURE YOU:  Understand these instructions.  Will watch your condition.  Will get help right away if you are not doing well or get worse.    Pick up stool softner and laxative for home use following surgery while on pain medications. Do not submerge incision under water. Please use good hand washing techniques while changing dressing each day. May shower starting three days after surgery. Please use a clean  towel to pat the incision dry following showers. Continue to use ice for pain and swelling after surgery. Do not use any lotions or creams on the incision until instructed by your surgeon.  Take Xarelto for two and a half more weeks, then discontinue Xarelto. Once the patient has completed the Xarelto, they may resume the 325 mg Aspirin.   Postoperative Constipation Protocol  Constipation - defined medically as fewer than three stools per week and severe constipation as less than one stool per week.  One of the most common issues patients have following surgery is constipation.  Even if you have a regular bowel pattern at home, your normal regimen is likely to be disrupted due to multiple reasons following surgery.  Combination of anesthesia, postoperative narcotics, change in appetite and fluid intake all can affect your bowels.  In order to avoid complications following surgery, here are some recommendations in order to help you during your recovery period.  Colace (docusate) - Pick up an over-the-counter form of Colace or another stool softener and  take twice a day as long as you are requiring postoperative pain medications.  Take with a full glass of water daily.  If you experience loose stools or diarrhea, hold the colace until you stool forms back up.  If your symptoms do not get better within 1 week or if they get worse, check with your doctor.  Dulcolax (bisacodyl) - Pick up over-the-counter and take as directed by the product packaging as needed to assist with the movement of your bowels.  Take with a full glass of water.  Use this product as needed if not relieved by Colace only.   MiraLax (polyethylene glycol) - Pick up over-the-counter to have on hand.  MiraLax is a solution that will increase the amount of water in your bowels to assist with bowel movements.  Take as directed and can mix with a glass of water, juice, soda, coffee, or tea.  Take if you go more than two days without a  movement. Do not use MiraLax more than once per day. Call your doctor if you are still constipated or irregular after using this medication for 7 days in a row.  If you continue to have problems with postoperative constipation, please contact the office for further assistance and recommendations.  If you experience "the worst abdominal pain ever" or develop nausea or vomiting, please contact the office immediatly for further recommendations for treatment.   When discharged from the skilled rehab facility, please have the facility set up the patient's [place] prior to being released.   Also provide the patient with their medications at time of release from the facility to include their pain medication, the muscle relaxants, and their blood thinner medication.  If the patient is still at the rehab facility at time of follow up appointment, please also assist the patient in arranging follow up appointment in our office and any transportation needs. ICE to the affected knee or hip every three hours for 30 minutes at a time and then as needed for pain and swelling.   Information on my medicine - XARELTO (Rivaroxaban)  This medication education was reviewed with me or my healthcare representative as part of my discharge preparation.  The pharmacist that spoke with me during my hospital stay was:  WOFFORD, DREW A, RPH  Why was Xarelto prescribed for you? Xarelto was prescribed for you to reduce the risk of blood clots forming after orthopedic surgery. The medical term for these abnormal blood clots is venous thromboembolism (VTE).  What do you need to know about xarelto ? Take your Xarelto ONCE DAILY at the same time every day. You may take it either with or without food.  If you have difficulty swallowing the tablet whole, you may crush it and mix in applesauce just prior to taking your dose.  Take Xarelto exactly as prescribed by your doctor and DO NOT stop taking  Xarelto without talking to the doctor who prescribed the medication.  Stopping without other VTE prevention medication to take the place of Xarelto may increase your risk of developing a clot.  After discharge, you should have regular check-up appointments with your healthcare provider that is prescribing your Xarelto.    What do you do if you miss a dose? If you miss a dose, take it as soon as you remember on the same day then continue your regularly scheduled once daily regimen the next day. Do not take two doses of Xarelto on the same day.   Important Safety  Information A possible side effect of Xarelto is bleeding. You should call your healthcare provider right away if you experience any of the following: ? Bleeding from an injury or your nose that does not stop. ? Unusual colored urine (red or dark brown) or unusual colored stools (red or black). ? Unusual bruising for unknown reasons. ? A serious fall or if you hit your head (even if there is no bleeding).  Some medicines may interact with Xarelto and might increase your risk of bleeding while on Xarelto. To help avoid this, consult your healthcare provider or pharmacist prior to using any new prescription or non-prescription medications, including herbals, vitamins, non-steroidal anti-inflammatory drugs (NSAIDs) and supplements.  This website has more information on Xarelto: [email or web address]

## 2014-05-03 NOTE — Progress Notes (Signed)
Physical Therapy Treatment Note    05/03/14 1400  PT Visit Information  Last PT Received On 05/03/14  Assistance Needed +1  History of Present Illness Pt is a 74 year old female s/p L TKA with hx of back surgeries, cervical spine surgeries, and R TKA.  PT Time Calculation  PT Start Time (ACUTE ONLY) 1344  PT Stop Time (ACUTE ONLY) 1358  PT Time Calculation (min) (ACUTE ONLY) 14 min  Subjective Data  Subjective Pt ambulated in room and then assisted back to bed.  Precautions  Precautions Fall;Knee  Required Braces or Orthoses Knee Immobilizer - Left  Knee Immobilizer - Left Discontinue once straight leg raise with < 10 degree lag  Restrictions  Other Position/Activity Restrictions WBAT  Pain Assessment  Pain Assessment 0-10  Pain Score 8  Pain Location L knee during ambulation  Pain Descriptors / Indicators Aching;Sore  Pain Intervention(s) Limited activity within patient's tolerance;Monitored during session;Repositioned  Cognition  Arousal/Alertness Awake/alert  Behavior During Therapy WFL for tasks assessed/performed  Overall Cognitive Status Within Functional Limits for tasks assessed  Bed Mobility  Overal bed mobility Needs Assistance  Bed Mobility Sit to Supine  Sit to supine Min assist  General bed mobility comments assist for L LE  Transfers  Overall transfer level Needs assistance  Equipment used Rolling walker (2 wheeled)  Transfers Sit to/from Stand  Sit to Stand Mod assist  General transfer comment verbal cues for technique, assist to rise and steady  Ambulation/Gait  Ambulation/Gait assistance Min assist  Ambulation Distance (Feet) 12 Feet  Assistive device Rolling walker (2 wheeled)  Gait Pattern/deviations Step-to pattern;Antalgic  General Gait Details verbal cues for sequence, RW distance, step length  PT - End of Session  Equipment Utilized During Treatment Gait belt;Left knee immobilizer  Activity Tolerance Patient limited by pain  Patient left in  bed;with call bell/phone within reach  PT - Assessment/Plan  PT Plan Current plan remains appropriate  PT Frequency (ACUTE ONLY) 7X/week  Follow Up Recommendations SNF  PT equipment None recommended by PT  PT Goal Progression  Progress towards PT goals Progressing toward goals  PT General Charges  $$ ACUTE PT VISIT 1 Procedure  PT Treatments  $Gait Training 8-22 mins   Carmelia Bake, PT, DPT 05/03/2014 Pager: [phone]

## 2014-05-03 NOTE — Care Management Note (Signed)
    Page 1 of 1   05/03/2014     9:28:21 AM CARE MANAGEMENT NOTE 05/03/2014  Patient:  Leslie Hendricks, Leslie Hendricks   Account Number:  [id number]  Date Initiated:  05/03/2014  Documentation initiated by:  [facility]  Subjective/Objective Assessment:   adm: LEFT TOTAL KNEE ARTHROPLASTY (Left)     Action/Plan:   discharge planning   Anticipated DC Date:  05/04/2014   Anticipated DC Plan:  SKILLED NURSING FACILITY         Choice offered to / List presented to:             Status of service:   Medicare Important Message given?   (If response is "NO", the following Medicare IM given date fields will be blank) Date Medicare IM given:   Medicare IM given by:   Date Additional Medicare IM given:   Additional Medicare IM given by:    Discharge Disposition:  [place]  Per UR Regulation:    If discussed at Long Length of Stay Meetings, dates discussed:    Comments:  05/03/14 09:27 CM notes pt to go to SNF; CSW arranging.  No other CM needs were communicated.  Mariane Masters, BSN, [place].

## 2014-05-03 NOTE — Progress Notes (Signed)
Clinical Social Work Department BRIEF PSYCHOSOCIAL ASSESSMENT 05/03/2014  Patient:  Leslie Hendricks, Leslie Hendricks     Account Number:  [id number]     Admit date:  05/02/2014  Clinical Social Worker:  Lacie Scotts  Date/Time:  05/03/2014 03:00 PM  Referred by:  Physician  Date Referred:  05/03/2014 Referred for  SNF Placement   Other Referral:   Interview type:  Patient Other interview type:    PSYCHOSOCIAL DATA Living Status:  ALONE Admitted from facility:   Level of care:   Primary support name:  Leslie Hendricks Primary support relationship to patient:  SIBLING Degree of support available:   supportive    CURRENT CONCERNS Current Concerns  Post-Acute Placement   Other Concerns:    SOCIAL WORK ASSESSMENT / PLAN Pt is a 74 yr old female living at home prior to hospitalization. CSW met with pt to assist with d/c planning. This is a planned admission. Pt has made prior arrangements to have [facility] Rehab at [facility] following hospital d/c. CSW has contacted SNF and d/c plan has been confirmed. CSW will continue to follow to assist with d/c planning to SNF.   Assessment/plan status:  Psychosocial Support/Ongoing Assessment of Needs Other assessment/ plan:   Information/referral to community resources:   Insurance coverage for SNF and ambulance transport reviewed.    PATIENT'S/FAMILY'S RESPONSE TO PLAN OF CARE: Pt's mood is bright. She had a fairly good night following surgery. " I didn't get too much sleep last night. " Pt is motivated to work with therapy and is looking forward to having rehab at [facility].    Werner Lean LCSW [phone]

## 2014-05-04 LAB — CBC
HCT: 24.9 % — ABNORMAL LOW (ref 36.0–46.0)
Hemoglobin: 8.3 g/dL — ABNORMAL LOW (ref 12.0–15.0)
MCH: 33.3 pg (ref 26.0–34.0)
MCHC: 33.3 g/dL (ref 30.0–36.0)
MCV: 100 fL (ref 78.0–100.0)
Platelets: 147 10*3/uL — ABNORMAL LOW (ref 150–400)
RBC: 2.49 MIL/uL — ABNORMAL LOW (ref 3.87–5.11)
RDW: 12.5 % (ref 11.5–15.5)
WBC: 10 10*3/uL (ref 4.0–10.5)

## 2014-05-04 LAB — BASIC METABOLIC PANEL
Anion gap: 5 (ref 5–15)
BUN: 25 mg/dL — ABNORMAL HIGH (ref 6–23)
CO2: 29 mmol/L (ref 19–32)
Calcium: 8.2 mg/dL — ABNORMAL LOW (ref 8.4–10.5)
Chloride: 106 mEq/L (ref 96–112)
Creatinine, Ser: 0.85 mg/dL (ref 0.50–1.10)
GFR calc Af Amer: 76 mL/min — ABNORMAL LOW (ref >90)
GFR calc non Af Amer: 65 mL/min — ABNORMAL LOW (ref >90)
Glucose, Bld: 136 mg/dL — ABNORMAL HIGH (ref 70–99)
Potassium: 3.6 mmol/L (ref 3.5–5.1)
Sodium: 140 mmol/L (ref 135–145)

## 2014-05-04 LAB — GLUCOSE, CAPILLARY
Glucose-Capillary: 115 mg/dL — ABNORMAL HIGH (ref 70–99)
Glucose-Capillary: 155 mg/dL — ABNORMAL HIGH (ref 70–99)
Glucose-Capillary: 131 mg/dL — ABNORMAL HIGH (ref 70–99)
Glucose-Capillary: 127 mg/dL — ABNORMAL HIGH (ref 70–99)

## 2014-05-04 NOTE — Progress Notes (Signed)
Physical Therapy Treatment Patient Details Name: Leslie Hendricks MRN: [id number] DOB: 1940-09-14 Today's Date: 05/04/2014    History of Present Illness Pt is a 74 year old female s/p L TKA with hx of back surgeries, cervical spine surgeries, and R TKA.    PT Comments    POD # 2 pm session.  Amb a short distance in hallway then assisted back to bed.    Follow Up Recommendations  SNF ([facility])     Equipment Recommendations  None recommended by PT    Recommendations for Other Services       Precautions / Restrictions Precautions Precautions: Fall;Knee Required Braces or Orthoses: Knee Immobilizer - Left Knee Immobilizer - Left: Discontinue once straight leg raise with < 10 degree lag Restrictions Weight Bearing Restrictions: No Other Position/Activity Restrictions: WBAT    Mobility  Bed Mobility Overal bed mobility: Needs Assistance Bed Mobility: Supine to Sit     Supine to sit: Min assist     General bed mobility comments: assist for L LE  Transfers Overall transfer level: Needs assistance Equipment used: Rolling walker (2 wheeled) Transfers: Sit to/from Stand Sit to Stand: Min assist;Mod assist         General transfer comment: verbal cues for technique, assist to rise and steady  Ambulation/Gait Ambulation/Gait assistance: Min assist Ambulation Distance (Feet): 18 Feet Assistive device: Rolling walker (2 wheeled) Gait Pattern/deviations: Step-to pattern;Decreased stance time - left;Decreased step length - right;Decreased step length - left;Trunk flexed Gait velocity: decreased   General Gait Details: verbal cues for sequence, RW distance, step length plus increased time   Science writer    Modified Rankin (Stroke Patients Only)       Balance                                    Cognition                            Exercises      General Comments        Pertinent  Vitals/Pain Pain Assessment: 0-10 Pain Score: 8  Pain Location: L knee Pain Descriptors / Indicators: Aching;Sore Pain Intervention(s): Monitored during session;Premedicated before session;Repositioned;Ice applied    Home Living                      Prior Function            PT Goals (current goals can now be found in the care plan section) Progress towards PT goals: Progressing toward goals    Frequency  7X/week    PT Plan Current plan remains appropriate    Co-evaluation             End of Session Equipment Utilized During Treatment: Gait belt;Left knee immobilizer Activity Tolerance: Patient limited by fatigue Patient left: in chair;with call bell/phone within reach     Time: 1410-1435 PT Time Calculation (min) (ACUTE ONLY): 25 min  Charges:  $Gait Training: 8-22 mins $Therapeutic Activity: 8-22 mins                    G Codes:
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
## 2014-05-04 NOTE — Progress Notes (Signed)
Physical Therapy Treatment Patient Details Name: Leslie Hendricks MRN: [id number] DOB: [birth year] Today's Date: 05/04/2014    History of Present Illness Pt is a 74 year old female s/p L TKA with hx of back surgeries, cervical spine surgeries, and R TKA.    PT Comments    POD # 2 am session.  Assisted pt OOB to amb a short distance in hallway.  Performed TKR TE's then applied ICE.  Pt required increased time and demon limited activity tolerance due to MAX c/o fatigue and pain level.    Follow Up Recommendations  SNF ([facility])     Equipment Recommendations  None recommended by PT    Recommendations for Other Services       Precautions / Restrictions Precautions Precautions: Fall;Knee Required Braces or Orthoses: Knee Immobilizer - Left Knee Immobilizer - Left: Discontinue once straight leg raise with < 10 degree lag Restrictions Weight Bearing Restrictions: No Other Position/Activity Restrictions: WBAT    Mobility  Bed Mobility Overal bed mobility: Needs Assistance Bed Mobility: Supine to Sit     Supine to sit: Min assist     General bed mobility comments: assist for L LE  Transfers Overall transfer level: Needs assistance Equipment used: Rolling walker (2 wheeled) Transfers: Sit to/from Stand Sit to Stand: Min assist;Mod assist         General transfer comment: verbal cues for technique, assist to rise and steady  Ambulation/Gait Ambulation/Gait assistance: Min assist Ambulation Distance (Feet): 18 Feet Assistive device: Rolling walker (2 wheeled) Gait Pattern/deviations: Step-to pattern;Decreased stance time - left;Decreased step length - right;Decreased step length - left;Trunk flexed Gait velocity: decreased   General Gait Details: verbal cues for sequence, RW distance, step length plus increased time   Science writer    Modified Rankin (Stroke Patients Only)       Balance                                     Cognition                            Exercises   Total Knee Replacement TE's 10 reps B LE ankle pumps 10 reps towel squeezes 10 reps knee presses 10 reps heel slides  10 reps SAQ's 10 reps SLR's 10 reps ABD Followed by ICE     General Comments        Pertinent Vitals/Pain Pain Assessment: 0-10 Pain Score: 8  Pain Location: L knee Pain Descriptors / Indicators: Aching;Sore Pain Intervention(s): Monitored during session;Premedicated before session;Repositioned;Ice applied    Home Living                      Prior Function            PT Goals (current goals can now be found in the care plan section) Progress towards PT goals: Progressing toward goals    Frequency  7X/week    PT Plan Current plan remains appropriate    Co-evaluation             End of Session Equipment Utilized During Treatment: Gait belt;Left knee immobilizer Activity Tolerance: Patient limited by fatigue Patient left: in chair;with call bell/phone within reach     Time: 1200-1240 PT Time Calculation (min) (ACUTE ONLY): 40 min  Charges:  $Gait Training: 8-22 mins $Therapeutic Exercise: 8-22 mins $Therapeutic Activity: 8-22 mins                    G Codes:      Rica Koyanagi  PTA [facility]  Acute  Rehab Pager      [phone]

## 2014-05-05 ENCOUNTER — Other Ambulatory Visit: Payer: Medicare Other

## 2014-05-05 ENCOUNTER — Encounter: Payer: Medicare Other | Admitting: Cardiothoracic Surgery

## 2014-05-05 LAB — GLUCOSE, CAPILLARY: Glucose-Capillary: 98 mg/dL (ref 70–99)

## 2014-05-05 LAB — CBC
HCT: 25.1 % — ABNORMAL LOW (ref 36.0–46.0)
Hemoglobin: 8.2 g/dL — ABNORMAL LOW (ref 12.0–15.0)
MCH: 33.2 pg (ref 26.0–34.0)
MCHC: 32.7 g/dL (ref 30.0–36.0)
MCV: 101.6 fL — ABNORMAL HIGH (ref 78.0–100.0)
Platelets: 157 10*3/uL (ref 150–400)
RBC: 2.47 MIL/uL — ABNORMAL LOW (ref 3.87–5.11)
RDW: 13.2 % (ref 11.5–15.5)
WBC: 9.6 10*3/uL (ref 4.0–10.5)

## 2014-05-05 MED ORDER — HYDROCODONE-ACETAMINOPHEN 5-325 MG PO TABS: 1.0000 | ORAL_TABLET | ORAL | Status: DC | PRN

## 2014-05-05 MED ORDER — METHOCARBAMOL 500 MG PO TABS: 500.0000 mg | ORAL_TABLET | Freq: Four times a day (QID) | ORAL | Status: DC | PRN

## 2014-05-05 MED ORDER — RIVAROXABAN 10 MG PO TABS: 10.0000 mg | ORAL_TABLET | Freq: Every day | ORAL | Status: DC

## 2014-05-05 MED ORDER — POLYETHYLENE GLYCOL 3350 17 G PO PACK: 17.0000 g | PACK | Freq: Every day | ORAL | Status: DC | PRN

## 2014-05-05 MED ORDER — DIAZEPAM 5 MG PO TABS: 5.0000 mg | ORAL_TABLET | Freq: Every day | ORAL | Status: DC

## 2014-05-05 MED ORDER — ONDANSETRON HCL 4 MG PO TABS: 4.0000 mg | ORAL_TABLET | Freq: Four times a day (QID) | ORAL | Status: DC | PRN

## 2014-05-05 MED ORDER — DSS 100 MG PO CAPS: 100.0000 mg | ORAL_CAPSULE | Freq: Two times a day (BID) | ORAL | Status: DC

## 2014-05-05 MED ORDER — BISACODYL 10 MG RE SUPP: 10.0000 mg | Freq: Every day | RECTAL | Status: DC | PRN

## 2014-05-05 MED ORDER — METOCLOPRAMIDE HCL 5 MG PO TABS: 5.0000 mg | ORAL_TABLET | Freq: Three times a day (TID) | ORAL | Status: DC | PRN

## 2014-05-05 MED ORDER — POLYSACCHARIDE IRON COMPLEX 150 MG PO CAPS: 150.0000 mg | ORAL_CAPSULE | Freq: Every day | ORAL | Status: DC

## 2014-05-05 MED ORDER — ACETAMINOPHEN 325 MG PO TABS: 650.0000 mg | ORAL_TABLET | Freq: Four times a day (QID) | ORAL | Status: DC | PRN

## 2014-05-05 NOTE — Progress Notes (Signed)
Clinical Social Work Department CLINICAL SOCIAL WORK PLACEMENT NOTE 05/05/2014  Patient:  Leslie Hendricks, Leslie Hendricks  Account Number:  [id number] Admit date:  05/02/2014  Clinical Social Worker:  Werner Lean, LCSW  Date/time:  05/05/2014 01:38 PM  Clinical Social Work is seeking post-discharge placement for this patient at the following level of care:   SKILLED NURSING   (*CSW will update this form in Epic as items are completed)     Patient/family provided with [facility] Department of Clinical Social Work's list of facilities offering this level of care within the geographic area requested by the patient (or if unable, by the patient's family).  05/05/2014  Patient/family informed of their freedom to choose among providers that offer the needed level of care, that participate in Medicare, Medicaid or managed care program needed by the patient, have an available bed and are willing to accept the patient.    Patient/family informed of [facility]' ownership interest in [facility], as well as of the fact that they are under no obligation to receive care at this facility.  PASARR submitted to EDS on 05/03/2014 PASARR number received on 05/03/2014  FL2 transmitted to all facilities in geographic area requested by pt/family on  05/03/2014 FL2 transmitted to all facilities within larger geographic area on   Patient informed that his/her managed care company has contracts with or will negotiate with  certain facilities, including the following:     Patient/family informed of bed offers received:  05/03/2014 Patient chooses bed at [place], [facility] Physician recommends and patient chooses bed at    Patient to be transferred to [place], [facility] on  05/05/2014 Patient to be transferred to facility by car Patient and family notified of transfer on 05/05/2014 Name of family member notified:  SISTER  The following physician request were entered in  Epic:   Additional Comments: Pt is in agreement with d/c to SNF today. PT approved of transport by car. NSG reviewed d/c summary, scripts, avs. Scripts included in d/c packet. Packet provided to pt.  Werner Lean LCSW [phone]
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
## 2014-05-05 NOTE — Progress Notes (Signed)
Physical Therapy Treatment Patient Details Name: Leslie Hendricks MRN: [id number] DOB: 1940/12/09 Today's Date: 05/05/2014    History of Present Illness Pt is a 74 year old female s/p L TKA with hx of back surgeries, cervical spine surgeries, and R TKA.    PT Comments    POD # 3 assisted pt out of recliner to amb to [facility].  Assisted off BSC then amb a short distance in in hallway.  Assisted with trasnfering to [facility] and into car.  Pt required max cueing on turn completion and backward gait sequencing.    Follow Up Recommendations  SNF ([place])     Equipment Recommendations       Recommendations for Other Services       Precautions / Restrictions Precautions Precautions: Fall;Knee Precaution Comments: able to perform SLR so D/C KI Knee Immobilizer - Left: Discontinue once straight leg raise with < 10 degree lag Restrictions Weight Bearing Restrictions: No Other Position/Activity Restrictions: WBAT    Mobility  Bed Mobility               General bed mobility comments: pt OOB in recliner  Transfers Overall transfer level: Needs assistance Equipment used: Rolling walker (2 wheeled) Transfers: Sit to/from Stand Sit to Stand: Min assist;Mod assist         General transfer comment: verbal cues for technique, assist to rise and steady also assisted in car  Ambulation/Gait Ambulation/Gait assistance: Min assist Ambulation Distance (Feet): 25 Feet Assistive device: Rolling walker (2 wheeled) Gait Pattern/deviations: Step-to pattern;Decreased stance time - left Gait velocity: decreased   General Gait Details: verbal cues for sequence, RW distance, step length plus increased time   Stairs            Wheelchair Mobility    Modified Rankin (Stroke Patients Only)       Balance                                    Cognition                            Exercises      General Comments        Pertinent Vitals/Pain  Pain Assessment: 0-10 Pain Score: 8  Pain Location: L knee Pain Descriptors / Indicators: Aching;Sore Pain Intervention(s): Monitored during session;RN gave pain meds during session;Repositioned;Ice applied    Home Living                      Prior Function            PT Goals (current goals can now be found in the care plan section) Progress towards PT goals: Progressing toward goals    Frequency  7X/week    PT Plan Current plan remains appropriate    Co-evaluation             End of Session Equipment Utilized During Treatment: Gait belt Activity Tolerance: Patient limited by pain Patient left: Other (comment) (in car)     Time: 6967-8938 PT Time Calculation (min) (ACUTE ONLY): 39 min  Charges:  $Gait Training: 23-37 mins $Therapeutic Activity: 8-22 mins                    G Codes:      Rica Koyanagi  PTA [facility]  Acute  Rehab Pager  [phone]  

## 2014-05-12 ENCOUNTER — Encounter: Payer: Medicare Other | Admitting: Cardiothoracic Surgery

## 2014-06-02 ENCOUNTER — Other Ambulatory Visit: Payer: Medicare Other

## 2014-06-02 ENCOUNTER — Encounter: Payer: Medicare Other | Admitting: Cardiothoracic Surgery

## 2014-07-21 ENCOUNTER — Encounter: Payer: Self-pay | Admitting: Cardiothoracic Surgery

## 2014-07-21 ENCOUNTER — Ambulatory Visit
Admission: RE | Admit: 2014-07-21 | Discharge: 2014-07-21 | Disposition: A | Discharge status: Home / Self Care | Payer: Medicare Other | Source: Ambulatory Visit | Attending: Cardiothoracic Surgery | Admitting: Cardiothoracic Surgery

## 2014-07-21 ENCOUNTER — Ambulatory Visit (INDEPENDENT_AMBULATORY_CARE_PROVIDER_SITE_OTHER): Payer: Medicare Other | Admitting: Cardiothoracic Surgery

## 2014-07-21 VITALS — BP 107/64 | HR 67 | Resp 16 | Ht 65.0 in | Wt 214.0 lb

## 2014-07-21 DIAGNOSIS — D381 Neoplasm of uncertain behavior of trachea, bronchus and lung: Secondary | ICD-10-CM

## 2014-07-21 DIAGNOSIS — R911 Solitary pulmonary nodule: Secondary | ICD-10-CM
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
## 2014-08-26 ENCOUNTER — Encounter: Payer: Self-pay | Admitting: *Deleted

## 2014-10-11 ENCOUNTER — Ambulatory Visit: Payer: Medicare Other | Attending: Neurosurgery | Admitting: Physical Therapy

## 2014-10-11 DIAGNOSIS — R2681 Unsteadiness on feet: Secondary | ICD-10-CM | POA: Insufficient documentation

## 2014-10-11 DIAGNOSIS — M542 Cervicalgia: Secondary | ICD-10-CM | POA: Insufficient documentation

## 2014-10-11 DIAGNOSIS — M436 Torticollis: Secondary | ICD-10-CM | POA: Insufficient documentation

## 2014-10-11 NOTE — Patient Instructions (Signed)
  Flexibility: Upper Trapezius Stretch   Gently grasp right side of head while reaching behind back with other hand. Tilt head away until a gentle stretch is felt. Hold 30____ seconds. Repeat _3___ times per set. Do ____ sets per session. Do __2__ sessions per day.  http://orth.exer.us/340   Levator Stretch   Grasp seat or sit on hand on side to be stretched. Turn head toward other side and look down. Use hand on head to gently stretch neck in that position. Hold _30___ seconds. Repeat on other side. Repeat _3___ times. Do __2__ sessions per day.  http://[email or web address].exer.us/30   Scapular Retraction (Standing)   With arms at sides, pinch shoulder blades together. Repeat 10____ times per set. Do ____ sets per session. Do _2___ sessions per day.  http://orth.exer.us/944   Flexibility: Neck Retraction   Pull head straight back, keeping eyes and jaw level. Repeat _10___ times per set. Do ____ sets per session. Do __3__ sessions per day.  http://orth.exer.us/344   Posture - Sitting   Sit upright, head facing forward. Try using a roll to support lower back. Keep shoulders relaxed, and avoid rounded back. Keep hips level with knees. Avoid crossing legs for long periods.  Madelyn Flavors, PT 10/11/2014 3:08 PM [facility] [address] [place], [place], [address] Phone: [phone]   Fax:  [phone]
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
## 2014-10-14 ENCOUNTER — Ambulatory Visit: Payer: Medicare Other | Attending: Neurosurgery | Admitting: Physical Therapy

## 2014-10-14 ENCOUNTER — Encounter: Payer: Self-pay | Admitting: Physical Therapy

## 2014-10-14 DIAGNOSIS — M436 Torticollis: Secondary | ICD-10-CM | POA: Diagnosis present

## 2014-10-14 DIAGNOSIS — M542 Cervicalgia: Secondary | ICD-10-CM | POA: Diagnosis present

## 2014-10-14 DIAGNOSIS — R2681 Unsteadiness on feet: Secondary | ICD-10-CM | POA: Insufficient documentation
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
## 2014-10-14 NOTE — Patient Instructions (Signed)
Pt. Encouraged to continue home exercise program.

## 2014-10-19 ENCOUNTER — Encounter: Payer: Self-pay | Admitting: Physical Therapy

## 2014-10-19 ENCOUNTER — Ambulatory Visit: Payer: Medicare Other | Admitting: Physical Therapy

## 2014-10-19 DIAGNOSIS — M542 Cervicalgia: Secondary | ICD-10-CM | POA: Diagnosis not present

## 2014-10-19 DIAGNOSIS — M436 Torticollis: Secondary | ICD-10-CM

## 2014-10-19 DIAGNOSIS — R2681 Unsteadiness on feet: Secondary | ICD-10-CM
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
## 2014-10-21 ENCOUNTER — Ambulatory Visit: Payer: Medicare Other | Admitting: Physical Therapy

## 2014-10-21 ENCOUNTER — Encounter: Payer: Self-pay | Admitting: Physical Therapy

## 2014-10-21 DIAGNOSIS — M436 Torticollis: Secondary | ICD-10-CM

## 2014-10-21 DIAGNOSIS — R2681 Unsteadiness on feet: Secondary | ICD-10-CM

## 2014-10-21 DIAGNOSIS — M542 Cervicalgia: Secondary | ICD-10-CM | POA: Diagnosis not present
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
## 2014-10-25 ENCOUNTER — Encounter: Payer: Self-pay | Admitting: Physical Therapy

## 2014-10-25 ENCOUNTER — Ambulatory Visit: Payer: Medicare Other | Admitting: Physical Therapy

## 2014-10-25 DIAGNOSIS — M436 Torticollis: Secondary | ICD-10-CM

## 2014-10-25 DIAGNOSIS — R2681 Unsteadiness on feet: Secondary | ICD-10-CM

## 2014-10-25 DIAGNOSIS — M542 Cervicalgia: Secondary | ICD-10-CM
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
## 2014-10-28 ENCOUNTER — Ambulatory Visit: Payer: Medicare Other | Attending: Orthopedic Surgery | Admitting: Physical Therapy

## 2014-10-28 DIAGNOSIS — R2681 Unsteadiness on feet: Secondary | ICD-10-CM | POA: Insufficient documentation

## 2014-10-28 DIAGNOSIS — M436 Torticollis: Secondary | ICD-10-CM | POA: Insufficient documentation

## 2014-10-28 DIAGNOSIS — M25511 Pain in right shoulder: Secondary | ICD-10-CM | POA: Diagnosis present

## 2014-10-28 DIAGNOSIS — R29898 Other symptoms and signs involving the musculoskeletal system: Secondary | ICD-10-CM | POA: Insufficient documentation

## 2014-10-28 DIAGNOSIS — M542 Cervicalgia: Secondary | ICD-10-CM | POA: Insufficient documentation
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
## 2014-11-01 ENCOUNTER — Encounter: Payer: Self-pay | Admitting: Physical Therapy

## 2014-11-01 ENCOUNTER — Ambulatory Visit: Payer: Medicare Other | Admitting: Physical Therapy

## 2014-11-01 DIAGNOSIS — R29898 Other symptoms and signs involving the musculoskeletal system: Secondary | ICD-10-CM

## 2014-11-01 DIAGNOSIS — M542 Cervicalgia: Secondary | ICD-10-CM

## 2014-11-01 DIAGNOSIS — R2681 Unsteadiness on feet: Secondary | ICD-10-CM

## 2014-11-01 DIAGNOSIS — M25511 Pain in right shoulder: Secondary | ICD-10-CM

## 2014-11-01 DIAGNOSIS — M436 Torticollis: Secondary | ICD-10-CM
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
## 2014-11-03 ENCOUNTER — Ambulatory Visit: Payer: Medicare Other | Admitting: *Deleted

## 2014-11-03 DIAGNOSIS — M436 Torticollis: Secondary | ICD-10-CM

## 2014-11-03 DIAGNOSIS — R29898 Other symptoms and signs involving the musculoskeletal system: Secondary | ICD-10-CM

## 2014-11-03 DIAGNOSIS — M25511 Pain in right shoulder: Secondary | ICD-10-CM

## 2014-11-03 DIAGNOSIS — M542 Cervicalgia: Secondary | ICD-10-CM
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
## 2014-11-08 ENCOUNTER — Ambulatory Visit: Payer: Medicare Other | Admitting: *Deleted

## 2014-11-08 DIAGNOSIS — R29898 Other symptoms and signs involving the musculoskeletal system: Secondary | ICD-10-CM

## 2014-11-08 DIAGNOSIS — M25511 Pain in right shoulder: Secondary | ICD-10-CM
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
## 2014-11-11 ENCOUNTER — Ambulatory Visit: Payer: Medicare Other | Admitting: *Deleted

## 2014-11-11 ENCOUNTER — Encounter: Payer: Self-pay | Admitting: *Deleted

## 2014-11-11 DIAGNOSIS — R29898 Other symptoms and signs involving the musculoskeletal system: Secondary | ICD-10-CM

## 2014-11-11 DIAGNOSIS — M25511 Pain in right shoulder: Secondary | ICD-10-CM | POA: Diagnosis not present
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
## 2014-11-14 ENCOUNTER — Ambulatory Visit: Payer: Medicare Other | Attending: Orthopedic Surgery | Admitting: *Deleted

## 2014-11-14 ENCOUNTER — Encounter: Payer: Self-pay | Admitting: *Deleted

## 2014-11-14 DIAGNOSIS — R29898 Other symptoms and signs involving the musculoskeletal system: Secondary | ICD-10-CM | POA: Insufficient documentation

## 2014-11-14 DIAGNOSIS — M25511 Pain in right shoulder: Secondary | ICD-10-CM | POA: Insufficient documentation
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
## 2014-11-16 ENCOUNTER — Ambulatory Visit: Payer: Medicare Other | Admitting: *Deleted

## 2014-11-16 ENCOUNTER — Encounter: Payer: Self-pay | Admitting: *Deleted

## 2014-11-16 DIAGNOSIS — R29898 Other symptoms and signs involving the musculoskeletal system: Secondary | ICD-10-CM

## 2014-11-16 DIAGNOSIS — M25511 Pain in right shoulder: Secondary | ICD-10-CM
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
## 2014-12-20 ENCOUNTER — Encounter: Payer: Self-pay | Admitting: Cardiovascular Disease

## 2015-01-10 ENCOUNTER — Encounter (HOSPITAL_COMMUNITY)
Admission: RE | Admit: 2015-01-10 | Discharge: 2015-01-10 | Disposition: A | Discharge status: Home / Self Care | Payer: Medicare Other | Source: Ambulatory Visit | Attending: Orthopedic Surgery | Admitting: Orthopedic Surgery

## 2015-01-10 ENCOUNTER — Encounter (HOSPITAL_COMMUNITY): Payer: Self-pay

## 2015-01-10 DIAGNOSIS — Z01812 Encounter for preprocedural laboratory examination: Secondary | ICD-10-CM | POA: Insufficient documentation

## 2015-01-10 DIAGNOSIS — M7541 Impingement syndrome of right shoulder: Secondary | ICD-10-CM | POA: Insufficient documentation

## 2015-01-10 DIAGNOSIS — Z79899 Other long term (current) drug therapy: Secondary | ICD-10-CM | POA: Diagnosis not present

## 2015-01-10 DIAGNOSIS — M19011 Primary osteoarthritis, right shoulder: Secondary | ICD-10-CM | POA: Diagnosis not present

## 2015-01-10 LAB — CBC WITH DIFFERENTIAL/PLATELET
Basophils Absolute: 0 10*3/uL (ref 0.0–0.1)
Basophils Relative: 0 %
Eosinophils Absolute: 0.3 10*3/uL (ref 0.0–0.7)
Eosinophils Relative: 4 %
HCT: 40.8 % (ref 36.0–46.0)
Hemoglobin: 13.5 g/dL (ref 12.0–15.0)
Lymphocytes Relative: 43 %
Lymphs Abs: 2.9 10*3/uL (ref 0.7–4.0)
MCH: 33.4 pg (ref 26.0–34.0)
MCHC: 33.1 g/dL (ref 30.0–36.0)
MCV: 101 fL — ABNORMAL HIGH (ref 78.0–100.0)
Monocytes Absolute: 0.7 10*3/uL (ref 0.1–1.0)
Monocytes Relative: 10 %
Neutro Abs: 2.9 10*3/uL (ref 1.7–7.7)
Neutrophils Relative %: 43 %
Platelets: 198 10*3/uL (ref 150–400)
RBC: 4.04 MIL/uL (ref 3.87–5.11)
RDW: 12.4 % (ref 11.5–15.5)
WBC: 6.7 10*3/uL (ref 4.0–10.5)

## 2015-01-10 LAB — COMPREHENSIVE METABOLIC PANEL
ALT: 19 U/L (ref 14–54)
AST: 24 U/L (ref 15–41)
Albumin: 3.8 g/dL (ref 3.5–5.0)
Alkaline Phosphatase: 77 U/L (ref 38–126)
Anion gap: 8 (ref 5–15)
BUN: 18 mg/dL (ref 6–20)
CO2: 33 mmol/L — ABNORMAL HIGH (ref 22–32)
Calcium: 9.6 mg/dL (ref 8.9–10.3)
Chloride: 99 mmol/L — ABNORMAL LOW (ref 101–111)
Creatinine, Ser: 0.95 mg/dL (ref 0.44–1.00)
GFR calc Af Amer: >60 mL/min (ref >60)
GFR calc non Af Amer: 57 mL/min — ABNORMAL LOW (ref >60)
Glucose, Bld: 80 mg/dL (ref 65–99)
Potassium: 4 mmol/L (ref 3.5–5.1)
Sodium: 140 mmol/L (ref 135–145)
Total Bilirubin: 0.7 mg/dL (ref 0.3–1.2)
Total Protein: 6.8 g/dL (ref 6.5–8.1)

## 2015-01-10 LAB — PROTIME-INR
INR: 0.99 (ref 0.00–1.49)
Prothrombin Time: 13.3 seconds (ref 11.6–15.2)

## 2015-01-10 LAB — APTT: aPTT: 26 seconds (ref 24–37)

## 2015-01-10 LAB — GLUCOSE, CAPILLARY: Glucose-Capillary: 83 mg/dL (ref 65–99)

## 2015-01-10 NOTE — Progress Notes (Signed)
PCP- K. Corrington in [facility]; originally started seeing Dr. Gwenlyn Found for cardiac after her husband passed away due to flutters in her chest & she has continued seeing him annually but reports that they haven't ever found anything wrong with her heart.   She denies all chest complaints today. Requesting HgbA1c from PCP, pt. Reports it was tested this month.

## 2015-01-10 NOTE — Pre-Procedure Instructions (Signed)
Leslie Hendricks  01/10/2015      [facility] [address], [place] [address] [place] [address] Phone: [phone] Fax: [phone]    Your procedure is scheduled on 01/19/2015.  Report to [facility] Admitting at 8:30 A.M.  Call this number if you have problems the morning of surgery:  [phone]   Remember:  Do not eat food or drink liquids after midnight.  On WEDNESDAY   Take these medicines the morning of surgery with A SIP OF WATER : Effexor               Stop all supplements & ASPIRIN, including Vit. E, Fish oil, Magnesium- one week before surgery    Do not wear jewelry, make-up or nail polish.   Do not wear lotions, powders, or perfumes.     Do not shave 48 hours prior to surgery.    Do not bring valuables to the hospital.   [facility] is not responsible for any belongings or valuables.  Contacts, dentures or bridgework may not be worn into surgery.  Leave your suitcase in the car.  After surgery it may be brought to your room.  For patients admitted to the hospital, discharge time will be determined by your treatment team.  Patients discharged the day of surgery will not be allowed to drive home.   Name and phone number of your driver:   sister Special instructions:Special Instructions   : [place] - Preparing for Surgery  Before surgery, you can play an important role.  Because skin is not sterile, your skin needs to be as free of germs as possible.  You can reduce the number of germs on you skin by washing with CHG (chlorahexidine gluconate) soap before surgery.  CHG is an antiseptic cleaner which kills germs and bonds with the skin to continue killing germs even after washing.  Please DO NOT use if you have an allergy to CHG or antibacterial soaps.  If your skin becomes reddened/irritated stop using the CHG and inform your nurse when you arrive at Short Stay.  Do not shave (including legs and underarms)  for at least 48 hours prior to the first CHG shower.  You may shave your face.  Please follow these instructions carefully:   1.  Shower with CHG Soap the night before surgery and the  morning of Surgery.  2.  If you choose to wash your hair, wash your hair first as usual with your  normal shampoo.  3.  After you shampoo, rinse your hair and body thoroughly to remove the  Shampoo.  4.  Use CHG as you would any other liquid soap.  You can apply chg directly to the skin and wash gently with scrungie or a clean washcloth.  5.  Apply the CHG Soap to your body ONLY FROM THE NECK DOWN.    Do not use on open wounds or open sores.  Avoid contact with your eyes, ears, mouth and genitals (private parts).  Wash genitals (private parts)   with your normal soap.  6.  Wash thoroughly, paying special attention to the area where your surgery will be performed.  7.  Thoroughly rinse your body with warm water from the neck down.  8.  DO NOT shower/wash with your normal soap after using and rinsing off   the CHG Soap.  9.  Pat yourself dry with a clean towel.  10.  Wear clean pajamas.            11.  Place clean sheets on your bed the night of your first shower and do not sleep with pets.  Day of Surgery  Do not apply any lotions/deodorants the morning of surgery.  Please wear clean clothes to the hospital/surgery center.    How to Manage Your Diabetes Before Surgery   Why is it important to control my blood sugar before and after surgery?   Improving blood sugar levels before and after surgery helps healing and can limit problems.  A way of improving blood sugar control is eating a healthy diet by:  - Eating less sugar and carbohydrates  - Increasing activity/exercise  - Talk with your doctor about reaching your blood sugar goals  High blood sugars (greater than 180 mg/dL) can raise your risk of infections and slow down your recovery so you will need to focus on controlling your diabetes  during the weeks before surgery.  Make sure that the doctor who takes care of your diabetes knows about your planned surgery including the date and location.  How do I manage my blood sugars before surgery?   Check your blood sugar at least 4 times a day, 2 days before surgery to make sure that they are not too high or low.   Check your blood sugar the morning of your surgery when you wake up and every 2               hours until you get to the Short-Stay unit.  If your blood sugar is less than 70 mg/dL, you will need to treat for low blood sugar by:  Treat a low blood sugar (less than 70 mg/dL) with 1/2 cup of clear juice (cranberry or apple), 4 glucose tablets, OR glucose gel.  Recheck blood sugar in 15 minutes after treatment (to make sure it is greater than 70 mg/dL).  If blood sugar is not greater than 70 mg/dL on re-check, call [phone] for further instructions.   Report your blood sugar to the Short-Stay nurse when you get to Short-Stay.  References:  [facility], 2007 "How to Manage your Diabetes Before and After Surgery".  What do I do about my diabetes medications?   Do not take oral diabetes medicines (pills) the morning of surgery.       Please read over the following fact sheets that you were given. Pain Booklet, Coughing and Deep Breathing and Surgical Site Infection Prevention

## 2015-01-10 NOTE — Pre-Procedure Instructions (Signed)
Leslie Hendricks  01/10/2015      [facility] [address], [place] [address] [place] [address] Phone: [phone] Fax: [phone]    Your procedure is scheduled on 01/19/2015.  Report to [facility] Admitting at 8:30 A.M.  Call this number if you have problems the morning of surgery:  [phone]   Remember:  Do not eat food or drink liquids after midnight.  On WEDNESDAY   Take these medicines the morning of surgery with A SIP OF WATER : Effexor               Stop all supplements & ASPIRIN, including Vit. E, Fish oil, Magnesium- one week before surgery    Do not wear jewelry, make-up or nail polish.   Do not wear lotions, powders, or perfumes.     Do not shave 48 hours prior to surgery.    Do not bring valuables to the hospital.   [facility] is not responsible for any belongings or valuables.  Contacts, dentures or bridgework may not be worn into surgery.  Leave your suitcase in the car.  After surgery it may be brought to your room.  For patients admitted to the hospital, discharge time will be determined by your treatment team.  Patients discharged the day of surgery will not be allowed to drive home.   Name and phone number of your driver:   sister Special instructions:    How to Manage Your Diabetes Before Surgery   Why is it important to control my blood sugar before and after surgery?   Improving blood sugar levels before and after surgery helps healing and can limit problems.  A way of improving blood sugar control is eating a healthy diet by:  - Eating less sugar and carbohydrates  - Increasing activity/exercise  - Talk with your doctor about reaching your blood sugar goals  High blood sugars (greater than 180 mg/dL) can raise your risk of infections and slow down your recovery so you will need to focus on controlling your diabetes during the weeks before surgery.  Make sure that the doctor who takes  care of your diabetes knows about your planned surgery including the date and location.  How do I manage my blood sugars before surgery?   Check your blood sugar at least 4 times a day, 2 days before surgery to make sure that they are not too high or low.   Check your blood sugar the morning of your surgery when you wake up and every 2               hours until you get to the Short-Stay unit.  If your blood sugar is less than 70 mg/dL, you will need to treat for low blood sugar by:  Treat a low blood sugar (less than 70 mg/dL) with 1/2 cup of clear juice (cranberry or apple), 4 glucose tablets, OR glucose gel.  Recheck blood sugar in 15 minutes after treatment (to make sure it is greater than 70 mg/dL).  If blood sugar is not greater than 70 mg/dL on re-check, call [phone] for further instructions.   Report your blood sugar to the Short-Stay nurse when you get to Short-Stay.  References:  [facility], 2007 "How to Manage your Diabetes Before and After Surgery".  What do I do about my diabetes medications?   Do not take oral diabetes medicines (pills) the morning  of surgery.       Please read over the following fact sheets that you were given. Pain Booklet, Coughing and Deep Breathing and Surgical Site Infection Prevention

## 2015-01-18 MED ORDER — CHLORHEXIDINE GLUCONATE 4 % EX LIQD: 60.0000 mL | Freq: Once | CUTANEOUS | Status: DC

## 2015-01-18 MED ORDER — CEFAZOLIN SODIUM-DEXTROSE 2-3 GM-% IV SOLR
2.0000 g | INTRAVENOUS | Status: AC
  Administered 2015-01-19: 2 g via INTRAVENOUS

## 2015-01-18 MED ORDER — LACTATED RINGERS IV SOLN
INTRAVENOUS | Status: DC
  Administered 2015-01-19: 07:00:00 via INTRAVENOUS

## 2015-01-18 NOTE — Progress Notes (Signed)
Notified patient of time change. Instructed patient to arrive at 530 am 01/19/15.

## 2015-01-19 ENCOUNTER — Ambulatory Visit (HOSPITAL_COMMUNITY): Payer: Medicare Other | Admitting: Anesthesiology

## 2015-01-19 ENCOUNTER — Encounter (HOSPITAL_COMMUNITY): Payer: Self-pay | Admitting: Certified Registered Nurse Anesthetist

## 2015-01-19 ENCOUNTER — Inpatient Hospital Stay (HOSPITAL_COMMUNITY)
Admission: AD | Admit: 2015-01-19 | Discharge: 2015-01-23 | DRG: 502 | Disposition: A | Discharge status: Skilled Nursing Facility | Payer: Medicare Other | Source: Ambulatory Visit | Attending: Orthopedic Surgery | Admitting: Orthopedic Surgery

## 2015-01-19 ENCOUNTER — Encounter (HOSPITAL_COMMUNITY)
Admission: AD | Disposition: A | Discharge status: Skilled Nursing Facility | Payer: Self-pay | Source: Ambulatory Visit | Attending: Orthopedic Surgery

## 2015-01-19 DIAGNOSIS — Z9889 Other specified postprocedural states: Secondary | ICD-10-CM

## 2015-01-19 DIAGNOSIS — M810 Age-related osteoporosis without current pathological fracture: Secondary | ICD-10-CM | POA: Diagnosis present

## 2015-01-19 DIAGNOSIS — Z91018 Allergy to other foods: Secondary | ICD-10-CM | POA: Diagnosis not present

## 2015-01-19 DIAGNOSIS — F329 Major depressive disorder, single episode, unspecified: Secondary | ICD-10-CM | POA: Diagnosis present

## 2015-01-19 DIAGNOSIS — Z96653 Presence of artificial knee joint, bilateral: Secondary | ICD-10-CM | POA: Diagnosis present

## 2015-01-19 DIAGNOSIS — K5900 Constipation, unspecified: Secondary | ICD-10-CM | POA: Diagnosis not present

## 2015-01-19 DIAGNOSIS — Z90710 Acquired absence of both cervix and uterus: Secondary | ICD-10-CM | POA: Diagnosis not present

## 2015-01-19 DIAGNOSIS — R339 Retention of urine, unspecified: Secondary | ICD-10-CM | POA: Diagnosis not present

## 2015-01-19 DIAGNOSIS — E785 Hyperlipidemia, unspecified: Secondary | ICD-10-CM | POA: Diagnosis present

## 2015-01-19 DIAGNOSIS — Z7984 Long term (current) use of oral hypoglycemic drugs: Secondary | ICD-10-CM | POA: Diagnosis not present

## 2015-01-19 DIAGNOSIS — Z87891 Personal history of nicotine dependence: Secondary | ICD-10-CM | POA: Diagnosis not present

## 2015-01-19 DIAGNOSIS — E559 Vitamin D deficiency, unspecified: Secondary | ICD-10-CM | POA: Diagnosis present

## 2015-01-19 DIAGNOSIS — Z981 Arthrodesis status: Secondary | ICD-10-CM

## 2015-01-19 DIAGNOSIS — M75121 Complete rotator cuff tear or rupture of right shoulder, not specified as traumatic: Secondary | ICD-10-CM | POA: Diagnosis not present

## 2015-01-19 DIAGNOSIS — Z79899 Other long term (current) drug therapy: Secondary | ICD-10-CM | POA: Diagnosis not present

## 2015-01-19 DIAGNOSIS — H3530 Unspecified macular degeneration: Secondary | ICD-10-CM | POA: Diagnosis present

## 2015-01-19 DIAGNOSIS — I10 Essential (primary) hypertension: Secondary | ICD-10-CM | POA: Diagnosis present

## 2015-01-19 DIAGNOSIS — Z885 Allergy status to narcotic agent status: Secondary | ICD-10-CM | POA: Diagnosis not present

## 2015-01-19 DIAGNOSIS — K219 Gastro-esophageal reflux disease without esophagitis: Secondary | ICD-10-CM | POA: Diagnosis present

## 2015-01-19 DIAGNOSIS — E1151 Type 2 diabetes mellitus with diabetic peripheral angiopathy without gangrene: Secondary | ICD-10-CM | POA: Diagnosis not present

## 2015-01-19 DIAGNOSIS — M25511 Pain in right shoulder: Secondary | ICD-10-CM | POA: Diagnosis present

## 2015-01-19 DIAGNOSIS — F419 Anxiety disorder, unspecified: Secondary | ICD-10-CM | POA: Diagnosis present

## 2015-01-19 DIAGNOSIS — M19019 Primary osteoarthritis, unspecified shoulder: Secondary | ICD-10-CM | POA: Diagnosis present

## 2015-01-19 DIAGNOSIS — G2581 Restless legs syndrome: Secondary | ICD-10-CM | POA: Diagnosis present

## 2015-01-19 DIAGNOSIS — D649 Anemia, unspecified: Secondary | ICD-10-CM | POA: Diagnosis not present

## 2015-01-19 DIAGNOSIS — Z7982 Long term (current) use of aspirin: Secondary | ICD-10-CM

## 2015-01-19 HISTORY — PX: SHOULDER ARTHROSCOPY WITH ROTATOR CUFF REPAIR AND SUBACROMIAL DECOMPRESSION: SHX5686

## 2015-01-19 HISTORY — PX: SHOULDER ARTHROSCOPY WITH SUBACROMIAL DECOMPRESSION, ROTATOR CUFF REPAIR AND BICEP TENDON REPAIR: SHX5687

## 2015-01-19 LAB — GLUCOSE, CAPILLARY
Glucose-Capillary: 97 mg/dL (ref 65–99)
Glucose-Capillary: 127 mg/dL — ABNORMAL HIGH (ref 65–99)
Glucose-Capillary: 93 mg/dL (ref 65–99)
Glucose-Capillary: 108 mg/dL — ABNORMAL HIGH (ref 65–99)

## 2015-01-19 SURGERY — SHOULDER ARTHROSCOPY WITH SUBACROMIAL DECOMPRESSION, ROTATOR CUFF REPAIR AND BICEP TENDON REPAIR
Anesthesia: General | Site: Shoulder | Laterality: Right

## 2015-01-19 MED ORDER — MIDAZOLAM HCL 2 MG/2ML IJ SOLN
INTRAMUSCULAR | Status: AC
  Filled 2015-01-19: qty 4

## 2015-01-19 MED ORDER — ONDANSETRON HCL 4 MG/2ML IJ SOLN: 4.0000 mg | Freq: Four times a day (QID) | INTRAMUSCULAR | Status: DC | PRN

## 2015-01-19 MED ORDER — METHOCARBAMOL 500 MG PO TABS
ORAL_TABLET | ORAL | Status: AC
  Filled 2015-01-19: qty 1

## 2015-01-19 MED ORDER — PHENYLEPHRINE 40 MCG/ML (10ML) SYRINGE FOR IV PUSH (FOR BLOOD PRESSURE SUPPORT)
PREFILLED_SYRINGE | INTRAVENOUS | Status: AC
  Filled 2015-01-19: qty 10

## 2015-01-19 MED ORDER — METOCLOPRAMIDE HCL 5 MG/ML IJ SOLN: 5.0000 mg | Freq: Three times a day (TID) | INTRAMUSCULAR | Status: DC | PRN

## 2015-01-19 MED ORDER — KETOROLAC TROMETHAMINE 15 MG/ML IJ SOLN
INTRAMUSCULAR | Status: AC
  Filled 2015-01-19: qty 1

## 2015-01-19 MED ORDER — POLYETHYLENE GLYCOL 3350 17 G PO PACK: 17.0000 g | PACK | Freq: Every day | ORAL | Status: DC | PRN

## 2015-01-19 MED ORDER — BUPIVACAINE-EPINEPHRINE (PF) 0.5% -1:200000 IJ SOLN
INTRAMUSCULAR | Status: DC | PRN
  Administered 2015-01-19: 20 mL via PERINEURAL

## 2015-01-19 MED ORDER — ROCURONIUM BROMIDE 100 MG/10ML IV SOLN
INTRAVENOUS | Status: DC | PRN
  Administered 2015-01-19: 50 mg via INTRAVENOUS

## 2015-01-19 MED ORDER — ATORVASTATIN CALCIUM 80 MG PO TABS
80.0000 mg | ORAL_TABLET | Freq: Every day | ORAL | Status: DC
  Administered 2015-01-19 – 2015-01-22 (×4): 80 mg via ORAL
  Filled 2015-01-19 (×4): qty 1

## 2015-01-19 MED ORDER — KETOROLAC TROMETHAMINE 15 MG/ML IJ SOLN
7.5000 mg | Freq: Four times a day (QID) | INTRAMUSCULAR | Status: AC
  Administered 2015-01-19 – 2015-01-20 (×4): 7.5 mg via INTRAVENOUS
  Filled 2015-01-19 (×3): qty 1

## 2015-01-19 MED ORDER — BISACODYL 5 MG PO TBEC
5.0000 mg | DELAYED_RELEASE_TABLET | Freq: Every day | ORAL | Status: DC | PRN
  Administered 2015-01-19: 5 mg via ORAL
  Filled 2015-01-19: qty 1

## 2015-01-19 MED ORDER — EPHEDRINE SULFATE 50 MG/ML IJ SOLN
INTRAMUSCULAR | Status: AC
  Filled 2015-01-19: qty 1

## 2015-01-19 MED ORDER — CEFAZOLIN SODIUM-DEXTROSE 2-3 GM-% IV SOLR
2.0000 g | Freq: Four times a day (QID) | INTRAVENOUS | Status: DC
  Filled 2015-01-19 (×3): qty 50

## 2015-01-19 MED ORDER — HYDROCODONE-ACETAMINOPHEN 10-325 MG PO TABS: 1.0000 | ORAL_TABLET | ORAL | Status: DC | PRN

## 2015-01-19 MED ORDER — DIPHENHYDRAMINE HCL 12.5 MG/5ML PO ELIX: 12.5000 mg | ORAL_SOLUTION | ORAL | Status: DC | PRN

## 2015-01-19 MED ORDER — ONDANSETRON HCL 4 MG PO TABS: 4.0000 mg | ORAL_TABLET | Freq: Four times a day (QID) | ORAL | Status: DC | PRN

## 2015-01-19 MED ORDER — ONDANSETRON HCL 4 MG/2ML IJ SOLN
INTRAMUSCULAR | Status: AC
  Filled 2015-01-19: qty 2

## 2015-01-19 MED ORDER — GLYCOPYRROLATE 0.2 MG/ML IJ SOLN
INTRAMUSCULAR | Status: DC | PRN
  Administered 2015-01-19: .8 mg via INTRAVENOUS

## 2015-01-19 MED ORDER — FENTANYL CITRATE (PF) 100 MCG/2ML IJ SOLN
25.0000 ug | INTRAMUSCULAR | Status: DC | PRN
  Administered 2015-01-19 (×2): 50 ug via INTRAVENOUS

## 2015-01-19 MED ORDER — PHENYLEPHRINE HCL 10 MG/ML IJ SOLN
INTRAMUSCULAR | Status: DC | PRN
  Administered 2015-01-19: 40 ug via INTRAVENOUS
  Administered 2015-01-19: 80 ug via INTRAVENOUS

## 2015-01-19 MED ORDER — CEFAZOLIN SODIUM-DEXTROSE 2-3 GM-% IV SOLR
2.0000 g | Freq: Four times a day (QID) | INTRAVENOUS | Status: AC
  Administered 2015-01-19 – 2015-01-20 (×3): 2 g via INTRAVENOUS
  Filled 2015-01-19 (×3): qty 50

## 2015-01-19 MED ORDER — INSULIN ASPART 100 UNIT/ML ~~LOC~~ SOLN
0.0000 [IU] | Freq: Three times a day (TID) | SUBCUTANEOUS | Status: DC
  Administered 2015-01-21 – 2015-01-22 (×4): 2 [IU] via SUBCUTANEOUS

## 2015-01-19 MED ORDER — HYDROCODONE-ACETAMINOPHEN 10-325 MG PO TABS
1.0000 | ORAL_TABLET | ORAL | Status: DC | PRN
  Administered 2015-01-19 – 2015-01-20 (×3): 2 via ORAL
  Administered 2015-01-20 – 2015-01-22 (×6): 1 via ORAL
  Administered 2015-01-23: 2 via ORAL
  Filled 2015-01-19 (×2): qty 2
  Filled 2015-01-19: qty 1
  Filled 2015-01-19: qty 2
  Filled 2015-01-19 (×2): qty 1
  Filled 2015-01-19: qty 2
  Filled 2015-01-19 (×4): qty 1

## 2015-01-19 MED ORDER — HYDROCODONE-ACETAMINOPHEN 7.5-325 MG PO TABS
ORAL_TABLET | ORAL | Status: AC
  Administered 2015-01-19: 2
  Filled 2015-01-19: qty 2

## 2015-01-19 MED ORDER — SUCCINYLCHOLINE CHLORIDE 20 MG/ML IJ SOLN
INTRAMUSCULAR | Status: AC
Start: 2015-01-19 — End: 2015-01-19
  Filled 2015-01-19: qty 1

## 2015-01-19 MED ORDER — MORPHINE SULFATE (PF) 2 MG/ML IV SOLN
1.0000 mg | INTRAVENOUS | Status: DC | PRN
  Administered 2015-01-19 – 2015-01-20 (×2): 2 mg via INTRAVENOUS
  Administered 2015-01-20: 1 mg via INTRAVENOUS
  Administered 2015-01-20 (×2): 2 mg via INTRAVENOUS
  Filled 2015-01-19 (×4): qty 1

## 2015-01-19 MED ORDER — PHENOL 1.4 % MT LIQD
1.0000 | OROMUCOSAL | Status: DC | PRN
  Administered 2015-01-20: 1 via OROMUCOSAL
  Filled 2015-01-19: qty 177

## 2015-01-19 MED ORDER — PROPOFOL 10 MG/ML IV BOLUS
INTRAVENOUS | Status: AC
  Filled 2015-01-19: qty 20

## 2015-01-19 MED ORDER — HYDROCHLOROTHIAZIDE 12.5 MG PO CAPS
12.5000 mg | ORAL_CAPSULE | Freq: Every day | ORAL | Status: DC
  Administered 2015-01-21 – 2015-01-23 (×3): 12.5 mg via ORAL
  Filled 2015-01-19 (×4): qty 1

## 2015-01-19 MED ORDER — ACETAMINOPHEN 650 MG RE SUPP: 650.0000 mg | Freq: Four times a day (QID) | RECTAL | Status: DC | PRN

## 2015-01-19 MED ORDER — LOSARTAN POTASSIUM-HCTZ 50-12.5 MG PO TABS: 1.0000 | ORAL_TABLET | Freq: Every morning | ORAL | Status: DC

## 2015-01-19 MED ORDER — MENTHOL 3 MG MT LOZG
1.0000 | LOZENGE | OROMUCOSAL | Status: DC | PRN
Start: 2015-01-19 — End: 2015-01-23

## 2015-01-19 MED ORDER — LIDOCAINE HCL (CARDIAC) 20 MG/ML IV SOLN
INTRAVENOUS | Status: AC
  Filled 2015-01-19: qty 5

## 2015-01-19 MED ORDER — VENLAFAXINE HCL ER 75 MG PO CP24
75.0000 mg | ORAL_CAPSULE | Freq: Every day | ORAL | Status: DC
  Administered 2015-01-20 – 2015-01-23 (×4): 75 mg via ORAL
  Filled 2015-01-19 (×4): qty 1

## 2015-01-19 MED ORDER — SODIUM CHLORIDE 0.9 % IR SOLN
Status: DC | PRN
  Administered 2015-01-19: 6000 mL

## 2015-01-19 MED ORDER — DOCUSATE SODIUM 100 MG PO CAPS
100.0000 mg | ORAL_CAPSULE | Freq: Two times a day (BID) | ORAL | Status: DC
  Administered 2015-01-19 – 2015-01-23 (×8): 100 mg via ORAL
  Filled 2015-01-19 (×8): qty 1

## 2015-01-19 MED ORDER — ALUM & MAG HYDROXIDE-SIMETH 200-200-20 MG/5ML PO SUSP: 30.0000 mL | ORAL | Status: DC | PRN

## 2015-01-19 MED ORDER — METOCLOPRAMIDE HCL 5 MG PO TABS: 5.0000 mg | ORAL_TABLET | Freq: Three times a day (TID) | ORAL | Status: DC | PRN

## 2015-01-19 MED ORDER — DIAZEPAM 5 MG PO TABS: 5.0000 mg | ORAL_TABLET | Freq: Every day | ORAL | Status: DC

## 2015-01-19 MED ORDER — FLEET ENEMA 7-19 GM/118ML RE ENEM
1.0000 | ENEMA | Freq: Once | RECTAL | Status: AC | PRN
  Administered 2015-01-21: 1 via RECTAL
  Filled 2015-01-19: qty 1

## 2015-01-19 MED ORDER — FENTANYL CITRATE (PF) 250 MCG/5ML IJ SOLN
INTRAMUSCULAR | Status: AC
  Filled 2015-01-19: qty 5

## 2015-01-19 MED ORDER — LOSARTAN POTASSIUM 50 MG PO TABS
50.0000 mg | ORAL_TABLET | Freq: Every day | ORAL | Status: DC
  Administered 2015-01-21 – 2015-01-23 (×3): 50 mg via ORAL
  Filled 2015-01-19 (×4): qty 1

## 2015-01-19 MED ORDER — NEOSTIGMINE METHYLSULFATE 10 MG/10ML IV SOLN
INTRAVENOUS | Status: DC | PRN
  Administered 2015-01-19: 5 mg via INTRAVENOUS

## 2015-01-19 MED ORDER — PROPOFOL 10 MG/ML IV BOLUS
INTRAVENOUS | Status: DC | PRN
  Administered 2015-01-19: 130 mg via INTRAVENOUS
  Administered 2015-01-19 (×2): 20 mg via INTRAVENOUS

## 2015-01-19 MED ORDER — ROCURONIUM BROMIDE 50 MG/5ML IV SOLN
INTRAVENOUS | Status: AC
  Filled 2015-01-19: qty 1

## 2015-01-19 MED ORDER — ACETAMINOPHEN 325 MG PO TABS
650.0000 mg | ORAL_TABLET | Freq: Four times a day (QID) | ORAL | Status: DC | PRN
  Filled 2015-01-19: qty 2

## 2015-01-19 MED ORDER — ONDANSETRON HCL 4 MG/2ML IJ SOLN
INTRAMUSCULAR | Status: DC | PRN
  Administered 2015-01-19: 4 mg via INTRAVENOUS

## 2015-01-19 MED ORDER — FENTANYL CITRATE (PF) 100 MCG/2ML IJ SOLN
INTRAMUSCULAR | Status: AC
  Administered 2015-01-19: 50 ug via INTRAVENOUS
  Filled 2015-01-19: qty 2

## 2015-01-19 MED ORDER — LIDOCAINE HCL (CARDIAC) 20 MG/ML IV SOLN
INTRAVENOUS | Status: DC | PRN
  Administered 2015-01-19: 100 mg via INTRAVENOUS

## 2015-01-19 MED ORDER — MORPHINE SULFATE (PF) 2 MG/ML IV SOLN
INTRAVENOUS | Status: AC
  Filled 2015-01-19: qty 1

## 2015-01-19 MED ORDER — FENTANYL CITRATE (PF) 100 MCG/2ML IJ SOLN
INTRAMUSCULAR | Status: DC | PRN
  Administered 2015-01-19 (×5): 50 ug via INTRAVENOUS

## 2015-01-19 MED ORDER — MIDAZOLAM HCL 5 MG/5ML IJ SOLN
INTRAMUSCULAR | Status: DC | PRN
  Administered 2015-01-19 (×2): 1 mg via INTRAVENOUS

## 2015-01-19 MED ORDER — EPHEDRINE SULFATE 50 MG/ML IJ SOLN
INTRAMUSCULAR | Status: DC | PRN
  Administered 2015-01-19 (×2): 10 mg via INTRAVENOUS

## 2015-01-19 MED ORDER — ONDANSETRON HCL 4 MG/2ML IJ SOLN: 4.0000 mg | Freq: Once | INTRAMUSCULAR | Status: DC | PRN

## 2015-01-19 MED ORDER — FUROSEMIDE 20 MG PO TABS
20.0000 mg | ORAL_TABLET | Freq: Every day | ORAL | Status: DC
  Administered 2015-01-20 – 2015-01-23 (×4): 20 mg via ORAL
  Filled 2015-01-19 (×4): qty 1

## 2015-01-19 MED ORDER — POLYVINYL ALCOHOL 1.4 % OP SOLN
Freq: Two times a day (BID) | OPHTHALMIC | Status: DC
  Administered 2015-01-21 – 2015-01-22 (×3): via OPHTHALMIC
  Filled 2015-01-19: qty 15

## 2015-01-19 MED ORDER — DIAZEPAM 5 MG PO TABS
5.0000 mg | ORAL_TABLET | Freq: Every day | ORAL | Status: DC
  Administered 2015-01-19 – 2015-01-22 (×4): 5 mg via ORAL
  Filled 2015-01-19 (×4): qty 1

## 2015-01-19 MED ORDER — METHOCARBAMOL 500 MG PO TABS
500.0000 mg | ORAL_TABLET | Freq: Four times a day (QID) | ORAL | Status: DC | PRN
  Administered 2015-01-19 – 2015-01-21 (×2): 500 mg via ORAL
  Filled 2015-01-19: qty 1

## 2015-01-19 MED ORDER — LACTATED RINGERS IV SOLN
INTRAVENOUS | Status: DC
  Administered 2015-01-19 – 2015-01-21 (×3): via INTRAVENOUS

## 2015-01-19 MED ORDER — METHOCARBAMOL 500 MG PO TABS: 500.0000 mg | ORAL_TABLET | Freq: Four times a day (QID) | ORAL | Status: DC | PRN

## 2015-01-19 SURGICAL SUPPLY — 63 items
ANCHOR PEEK 4.75X19.1 SWLK C (Anchor) ×3 IMPLANT
ANCHOR PEEK SWIVEL LOCK 5.5 (Anchor) ×6 IMPLANT
BLADE CUTTER GATOR 3.5 (BLADE) ×3 IMPLANT
BLADE GREAT WHITE 4.2 (BLADE) ×2 IMPLANT
BLADE GREAT WHITE 4.2MM (BLADE) ×1
BLADE SURG 11 STRL SS (BLADE) ×3 IMPLANT
BOOTCOVER CLEANROOM LRG (PROTECTIVE WEAR) IMPLANT
BUR OVAL 4.0 (BURR) ×3 IMPLANT
CANISTER SUCT LVC 12 LTR MEDI- (MISCELLANEOUS) ×3 IMPLANT
CANNULA ACUFLEX KIT 5X76 (CANNULA) ×3 IMPLANT
CANNULA DRILOCK 5.0MMX75MM (CANNULA) ×1
CANNULA DRILOCK 5.0X75 (CANNULA) ×2 IMPLANT
CANNULA TWIST IN 8.25X7CM (CANNULA) ×3 IMPLANT
CLOSURE WOUND 1/2 X4 (GAUZE/BANDAGES/DRESSINGS) ×1
CONNECTOR 5 IN 1 STRAIGHT STRL (MISCELLANEOUS) ×3 IMPLANT
DRAPE INCISE 23X17 IOBAN STRL (DRAPES)
DRAPE INCISE IOBAN 23X17 STRL (DRAPES) IMPLANT
DRAPE ORTHO SPLIT 77X108 STRL (DRAPES) ×4
DRAPE STERI 35X30 U-POUCH (DRAPES) IMPLANT
DRAPE SURG 17X11 SM STRL (DRAPES) ×3 IMPLANT
DRAPE SURG ORHT 6 SPLT 77X108 (DRAPES) ×2 IMPLANT
DRAPE U-SHAPE 47X51 STRL (DRAPES) IMPLANT
DRSG PAD ABDOMINAL 8X10 ST (GAUZE/BANDAGES/DRESSINGS) ×6 IMPLANT
DURAPREP 26ML APPLICATOR (WOUND CARE) ×3 IMPLANT
GAUZE SPONGE 4X4 12PLY STRL (GAUZE/BANDAGES/DRESSINGS) ×3 IMPLANT
GLOVE BIO SURGEON STRL SZ7.5 (GLOVE) ×3 IMPLANT
GLOVE BIO SURGEON STRL SZ8 (GLOVE) ×3 IMPLANT
GLOVE EUDERMIC 7 POWDERFREE (GLOVE) ×3 IMPLANT
GLOVE SS BIOGEL STRL SZ 7.5 (GLOVE) ×1 IMPLANT
GLOVE SUPERSENSE BIOGEL SZ 7.5 (GLOVE) ×2
GOWN STRL REUS W/ TWL LRG LVL3 (GOWN DISPOSABLE) ×1 IMPLANT
GOWN STRL REUS W/ TWL XL LVL3 (GOWN DISPOSABLE) ×4 IMPLANT
GOWN STRL REUS W/TWL LRG LVL3 (GOWN DISPOSABLE) ×2
GOWN STRL REUS W/TWL XL LVL3 (GOWN DISPOSABLE) ×8
KIT BASIN OR (CUSTOM PROCEDURE TRAY) ×3 IMPLANT
KIT ROOM TURNOVER OR (KITS) ×3 IMPLANT
KIT SHOULDER TRACTION (DRAPES) ×3 IMPLANT
MANIFOLD NEPTUNE II (INSTRUMENTS) ×3 IMPLANT
NDL SUT 6 .5 CRC .975X.05 MAYO (NEEDLE) IMPLANT
NEEDLE MAYO TAPER (NEEDLE)
NEEDLE SCORPION MULTI FIRE (NEEDLE) ×3 IMPLANT
NEEDLE SPNL 18GX3.5 QUINCKE PK (NEEDLE) ×3 IMPLANT
NS IRRIG 1000ML POUR BTL (IV SOLUTION) IMPLANT
PACK SHOULDER (CUSTOM PROCEDURE TRAY) ×3 IMPLANT
PAD ARMBOARD 7.5X6 YLW CONV (MISCELLANEOUS) ×6 IMPLANT
SET ARTHROSCOPY TUBING (MISCELLANEOUS) ×2
SET ARTHROSCOPY TUBING LN (MISCELLANEOUS) ×1 IMPLANT
SLING ARM FOAM STRAP LRG (SOFTGOODS) ×3 IMPLANT
SLING ARM MED ADULT FOAM STRAP (SOFTGOODS) IMPLANT
SPONGE LAP 4X18 X RAY DECT (DISPOSABLE) IMPLANT
STRIP CLOSURE SKIN 1/2X4 (GAUZE/BANDAGES/DRESSINGS) ×2 IMPLANT
SUT MNCRL AB 3-0 PS2 18 (SUTURE) IMPLANT
SUT PDS AB 0 CT 36 (SUTURE) IMPLANT
SUT RETRIEVER GRASP 30 DEG (SUTURE) IMPLANT
SUT TIGER TAPE 7 IN WHITE (SUTURE) ×3 IMPLANT
SYR 20CC LL (SYRINGE) IMPLANT
TAPE FIBER 2MM 7IN #2 BLUE (SUTURE) ×6 IMPLANT
TAPE PAPER 3X10 WHT MICROPORE (GAUZE/BANDAGES/DRESSINGS) ×3 IMPLANT
TOWEL OR 17X24 6PK STRL BLUE (TOWEL DISPOSABLE) ×3 IMPLANT
TOWEL OR 17X26 10 PK STRL BLUE (TOWEL DISPOSABLE) ×3 IMPLANT
WAND HAND CNTRL MULTIVAC 90 (MISCELLANEOUS) ×3 IMPLANT
WAND SUCTION MAX 4MM 90S (SURGICAL WAND) IMPLANT
WATER STERILE IRR 1000ML POUR (IV SOLUTION) ×3 IMPLANT

## 2015-01-19 NOTE — Anesthesia Preprocedure Evaluation (Addendum)
Anesthesia Evaluation  Patient identified by MRN, date of birth, ID band Patient awake    Reviewed: Allergy & Precautions, NPO status , Patient's Chart, lab work & pertinent test results  History of Anesthesia Complications Negative for: history of anesthetic complications  Airway Mallampati: III  TM Distance: <3 FB Neck ROM: Limited    Dental no notable dental hx. (+) Dental Advisory Given, Lower Dentures, Upper Dentures   Pulmonary former smoker,    Pulmonary exam normal breath sounds clear to auscultation       Cardiovascular hypertension, Pt. on medications (-) angina+ Peripheral Vascular Disease  (-) Past MI Normal cardiovascular exam Rhythm:Regular Rate:Normal     Neuro/Psych  Headaches, PSYCHIATRIC DISORDERS Anxiety Depression  Neuromuscular disease    GI/Hepatic Neg liver ROS, GERD  Medicated and Controlled,  Endo/Other  diabetes, Type 2, Oral Hypoglycemic AgentsObesity   Renal/GU negative Renal ROS  negative genitourinary   Musculoskeletal  (+) Arthritis , Osteoarthritis,    Abdominal   Peds negative pediatric ROS (+)  Hematology  (+) Blood dyscrasia, anemia ,   Anesthesia Other Findings   Reproductive/Obstetrics negative OB ROS                           Anesthesia Physical Anesthesia Plan  ASA: III  Anesthesia Plan: General   Post-op Pain Management: GA combined w/ Regional for post-op pain   Induction: Intravenous  Airway Management Planned: Oral ETT and Video Laryngoscope Planned  Additional Equipment:   Intra-op Plan:   Post-operative Plan: Extubation in OR  Informed Consent: I have reviewed the patients History and Physical, chart, labs and discussed the procedure including the risks, benefits and alternatives for the proposed anesthesia with the patient or authorized representative who has indicated his/her understanding and acceptance.   Dental advisory  given  Plan Discussed with: CRNA  Anesthesia Plan Comments: (Risks/benefits of general anesthesia discussed with patient including risk of damage to teeth, lips, gum, and tongue, nausea/vomiting, allergic reactions to medications, and the possibility of heart attack, stroke and death.  All patient questions answered.  Patient wishes to proceed.  Discussed risks and benefits of interscalene block including failure, bleeding, infection, nerve damage, weakness, shortness of breath, pneumothorax. Questions answered. Patient consents to block. )       Anesthesia Quick Evaluation

## 2015-01-19 NOTE — Anesthesia Procedure Notes (Addendum)
Anesthesia Regional Block:  Interscalene brachial plexus block  Pre-Anesthetic Checklist: ,, timeout performed, Correct Patient, Correct Site, Correct Laterality, Correct Procedure, Correct Position, site marked, Risks and benefits discussed,  Surgical consent,  Pre-op evaluation,  At surgeon's request and post-op pain management  Laterality: Right  Prep: chloraprep       Needles:  Injection technique: Single-shot  Needle Type: Echogenic Stimulator Needle     Needle Length: 5cm 5 cm Needle Gauge: 22 and 22 G    Additional Needles:  Procedures: ultrasound guided (picture in chart) Interscalene brachial plexus block Narrative:  Injection made incrementally with aspirations every 5 mL.  Performed by: Personally  Anesthesiologist: Catalina Gravel  Additional Notes: Functioning IV was confirmed and monitors were applied.  A 78mm 22ga Arrow echogenic stimulator needle was used. Sterile prep and drape,hand hygiene and sterile gloves were used.  Negative aspiration and negative test dose prior to incremental administration of local anesthetic. The patient tolerated the procedure well.  Ultrasound guidance: relevent anatomy identified, needle position confirmed, local anesthetic spread visualized around nerve(s), vascular puncture avoided.  Image printed for medical record.    Procedure Name: Intubation Date/Time: 01/19/2015 7:46 AM Performed by: Salli Quarry Tyrae Alcoser Pre-anesthesia Checklist: Patient identified, Emergency Drugs available, Suction available, Patient being monitored and Timeout performed Patient Re-evaluated:Patient Re-evaluated prior to inductionOxygen Delivery Method: Circle system utilized Preoxygenation: Pre-oxygenation with 100% oxygen Intubation Type: IV induction Ventilation: Mask ventilation without difficulty and Oral airway inserted - appropriate to patient size Laryngoscope Size: Glidescope and 3 Grade View: Grade I Tube type: Oral Tube size: 7.0  mm Number of attempts: 1 Airway Equipment and Method: Stylet and Video-laryngoscopy Placement Confirmation: ETT inserted through vocal cords under direct vision,  positive ETCO2 and breath sounds checked- equal and bilateral Secured at: 22 cm Tube secured with: Tape Dental Injury: Teeth and Oropharynx as per pre-operative assessment

## 2015-01-19 NOTE — Op Note (Signed)
NAMEJESA, Leslie Hendricks NO.:  [id number]  MEDICAL RECORD NO.:  [id number]  LOCATION:  5N31C                        FACILITY:  [facility]  PHYSICIAN:  Vania Rea. Jill Ruppe, M.D.  DATE OF BIRTH:  Apr 02, 1941  DATE OF PROCEDURE:  01/19/2015 DATE OF DISCHARGE:                              OPERATIVE REPORT   PREOPERATIVE DIAGNOSES: 1. 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 2. Right shoulder symptomatic AC joint arthropathy. 3. Right shoulder full-thickness rotator cuff tear.  POSTOPERATIVE DIAGNOSES: 1. 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 2. Right shoulder symptomatic AC joint arthropathy. 3. Right shoulder full-thickness rotator cuff tear. 4. Complex and extensive labral tear. 5. Biceps tendon tearing.  PROCEDURES: 1. Right shoulder examination under anesthesia. 2. Right shoulder glenohumeral joint diagnostic arthroscopy. 3. Labral debridement. 4. Biceps tendon tenotomy. 5. Arthroscopic subacromial decompression and bursectomy. 6. Arthroscopic distal clavicle resection. 7. Arthroscopic rotator cuff repair using a double-row suture bridge     repair construct.  SURGEON:  Vania Rea. Ladislao Cohenour, M.D.  Threasa HeadsFrench Ana A. Shuford, PA-C.  ANESTHESIA:  General endotracheal as well as interscalene block.  ESTIMATED BLOOD LOSS:  Minimal.  DRAINS:  None.  HISTORY:  Ms. Leslie Hendricks is a 74 year old female who has had chronic and progressive increasing right shoulder pain with associated functional limitations and symptoms that have been refractory to prolonged attempts at conservative management.  Her preoperative MRI scan shows marked rotator cuff tendinosis with full-thickness rotator cuff tear and significant bony impingement.  Due to her ongoing pain and functional limitations, she is brought to the operating room at this time for planned right shoulder arthroscopy as described below.  Preoperatively, I counseled Ms. Leslie Hendricks regarding  treatment options and potential risks versus benefits thereof.  Possible surgical complications were all reviewed including bleeding, infection, neurovascular injury, persistent pain, loss of motion, anesthetic complication, recurrence of rotator cuff tear, and possible need for additional surgery.  She understands and accepts and agrees to planned procedure.  We also discussed at length postop management.  Ms. Leslie Hendricks lives alone and has a number of significant underlying medical comorbidities including severe ataxia, and she is dependent on using a walker.  She has no help at home.  We discussed postop plans and have agreed upon the proposed admission to the hospital and transferring to a skilled nursing facility for short stay postop; and once she has regained appropriate use of the right shoulder, then she could return to the use of a walker at which time she would then likely be ready for return to the home environment.  All these issues had been discussed at length preoperatively.  PROCEDURE IN DETAIL:  After undergoing routine preop evaluation, the patient received prophylactic antibiotics.  An interscalene block was established in holding area by Anesthesia Department.  She was placed on the operating table, underwent smooth induction of general endotracheal anesthesia.  Turned to the left lateral decubitus position on a beanbag and appropriately padded and protected.  Right shoulder examination under anesthesia revealed full motion and no instability patterns noted. Right arm suspended at 70 degrees of abduction with 10 pounds of traction.  Right shoulder girdle region was sterilely prepped and draped in standard  fashion.  Time-out was called.  Posterior portal established in glenohumeral joint.  Anterior portal established under direct visualization.  A complex and extensive degenerative tearing of the superior half of the labrum was noted extending up to the biceps  anchor. The biceps anchor was markedly compromised with thinning of the biceps tendon proximally.  A shaver was used to debride the superior labrum back to stable margin.  Given the degree of compromise of the biceps tendon proximally, performed biceps tenotomy.  The articular surface was found to be in excellent condition.  No instability patterns noted. There was an obvious full-thickness defect of the distal supraspinatus. We did debride the rotator cuff tear from the articular side.  Fluid and instruments were then removed.  The arm was dropped down to 30 degrees of abduction.  Arthroscope introduced into the subacromial space of the posterior portal and a direct lateral portal site in the subacromial space.  Abundant dense bursal tissue and multiple adhesions were encountered, and these were all divided and excised, shaver and Stryker wand.  The wand was then used to remove the periosteum from the undersurface of the anterior half of the acromion.  The subacromial decompression was performed with a bur creating a type 1 morphology. Portals were then established directly anterior to the distal clavicle, and the distal clavicle resection was performed with a bur.  Care was taken to confirm visualization of the entire circumference of the distal clavicle to ensure adequate removal of bone.  Repair was then completed with subacromial/subdeltoid bursectomy.  The rotator cuff tear was readily identified.  The rotator cuff was debrided back to healthy tissue at the tear site.  We then prepared the greater tuberosity removing soft tissue abrading the bone to bleeding bed.  Two stab wounds off lateral margin acromion, placed an Arthrex SwiveLock suture anchor loaded with 2 FiberTapes.  The 4 FiberTapes were then shuttled through the margin of the rotator cuff equidistant across the width of the tear, and we placed 2 lateral row anchors using 5.5 SwivelLocks creating a double-row repair  construct which nicely compressed the margin of the rotator cuff against bony bed of tuberosity.  Overall, construct was much to our satisfaction.  Suture limbs were all then clipped. Bursectomy was completed.  Fluid and instruments were removed.  The portals were closed with Monocryl and Steri-Strips with dry dressing taped at the right shoulder.  Right arm was placed in a sling.  The patient was awakened, extubated, and taken to the recovery room in stable condition.  Ralene Bathe, PA-C, was used as an Geophysicist/field seismologist throughout the case, essential for help with positioning the patient, positioning of the extremity, management of the arthroscopic equipment, tissue manipulation, suture management, wound closure, and intraoperative decision making.     Vania Rea. Casha Estupinan, M.D.     KMS/MEDQ  D:  01/19/2015  T:  01/19/2015  Job:  [id number]
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
## 2015-01-19 NOTE — Transfer of Care (Addendum)
Immediate Anesthesia Transfer of Care Note  Patient: Leslie Hendricks  Procedure(s) Performed: Procedure(s): RIGHT SHOULDER ARTHROSCOPY WITH SUBACROMIAL DECOMPRESSION, POSSIBLE ROTATOR CUFF REPAIR  (Right)  Patient Location: PACU  Anesthesia Type:General and Regional  Level of Consciousness: awake and patient cooperative  Airway & Oxygen Therapy: Patient Spontanous Breathing and Patient connected to nasal cannula oxygen  Post-op Assessment: Report given to RN and Post -op Vital signs reviewed and stable  Post vital signs: Reviewed and stable  Last Vitals:  Filed Vitals:   01/19/15 0609  BP: 138/47  Pulse: 74  Temp: 36.4 C  Resp: 20    Complications: No apparent anesthesia complications
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
## 2015-01-19 NOTE — Op Note (Signed)
01/19/2015  9:35 AM  PATIENT:   Leslie Hendricks  74 y.o. female  PRE-OPERATIVE DIAGNOSIS:  right shoulder chronic impingement and AC joint osteoarthritis, rotator cuff tear  POST-OPERATIVE DIAGNOSIS:  Same with labral and bicep tendon tearing  PROCEDURE:  RSA, labral debridement, bicep tenotomy, SAD, DCR, RCR  SURGEON:  Henry Demeritt, Metta Clines M.D.  ASSISTANTS: Shuford pac   ANESTHESIA:   GET + ISB  EBL: min  SPECIMEN:  none  Drains: none   PATIENT DISPOSITION:  PACU - hemodynamically stable.    PLAN OF CARE: Admit to inpatient   Dictation# [id number] Plan for inpatient admit and transfer to SNF for initial post op care   Contact # [phone]

## 2015-01-19 NOTE — Discharge Instructions (Signed)
° °  Kevin M. Supple, M.D., F.A.A.O.S. °Orthopaedic Surgery °Specializing in Arthroscopic and Reconstructive °Surgery of the Shoulder and Knee °[phone] °[address] - [place], [place] [address] - Fax [phone] ° °POST-OP SHOULDER ARTHROSCOPIC ROTATOR CUFF  REPAIR INSTRUCTIONS ° °1. Call the office at [phone] to schedule your first post-op appointment 7-10 days from the date of your surgery. ° °2. Leave the steri-strips in place over your incisions when performing dressing changes and showering. You may remove your dressings and begin showering 72 hours from surgery. You can expect drainage that is clear to bloody in nature that occasionally will soak through your dressings. If this occurs go ahead and perform a dressing change. The drainage should lessen daily and when there is no drainage from your incisions feel free to go without a dressing. ° °3. Wear your sling/immobilizer at all times except to perform the exercises below or to occasionally let your arm dangle by your side to stretch your elbow. You also need to sleep in your sling immobilizer until instructed otherwise. ° °4. Range of motion to your elbow, wrist, and hand are encouraged 3-5 times daily. Exercise to your hand and fingers helps to reduce swelling you may experience. ° °5. Utilize ice to the shoulder 3-4 times minimum a day and additionally if you are experiencing pain. ° °6. You may one-armed drive when safely off of narcotics and muscle relaxants. You may use your hand that is in the sling to support the steering wheel only. However, should it be your right arm that is in the sling it is not to be used for gear shifting in a manual transmission. ° °7. If you had a block pre-operatively to provide post-op pain relief you may want to go ahead and begin utilizing your pain meds as your arm begins to wake up. Blocks can sometimes last up to 16-18 hours. If you are still pain-free prior to going to bed you may want to  strongly consider taking a pain medication to avoid being awakened in the night with the onset of pain. A muscle relaxant is also provided for you should you experience muscle spasms. It is recommended that if you are experiencing pain that your pain medication alone is not controlling, add the muscle relaxant along with the pain medication which can give additional pain relief. The first one to two days is generally the most severe of your pain and then should gradually decrease. As your pain lessens it is recommended that you decrease your use of the pain medications to an "as needed basis" only and to always comply with the recommended dosages of the pain medications. ° °8. Pain medications can produce constipation along with their use. If you experience this, the use of an over the counter stool softener or laxative daily is recommended.  ° °9. For additional questions or concerns, please do not hesitate to call the office. If after hours there is an answering service to forward your concerns to the physician on call. ° °POST-OP EXERCISES ° °Pendulum Exercises ° °Perform pendulum exercises while standing and bending at the waist. Support your uninvolved arm on a table or chair and allow your operated arm to hang freely. Make sure to do these exercises passively - not using you shoulder muscle. ° °Repeat 20 times. Do 3 sessions per day. ° ° °

## 2015-01-20 ENCOUNTER — Encounter (HOSPITAL_COMMUNITY): Payer: Self-pay | Admitting: Orthopedic Surgery

## 2015-01-20 LAB — GLUCOSE, CAPILLARY
Glucose-Capillary: 107 mg/dL — ABNORMAL HIGH (ref 65–99)
Glucose-Capillary: 89 mg/dL (ref 65–99)
Glucose-Capillary: 104 mg/dL — ABNORMAL HIGH (ref 65–99)
Glucose-Capillary: 140 mg/dL — ABNORMAL HIGH (ref 65–99)

## 2015-01-20 NOTE — Evaluation (Signed)
Physical Therapy Evaluation Patient Details Name: Leslie Hendricks MRN: [id number] DOB: 16-May-1940 Today's Date: 01/20/2015   History of Present Illness  74 yo female s/p arthroscopic rotator cuff repair using a double row suture bridge repair contruct, arthroscopic distal clavical resection, arthroscopic subacromial decompression and bursectomy, biceps tendon tenotomy, R glenohumeral joint diagnostic arthoscopy, and labral debridement. PMH: chronic back pain, Macular degeneration bil eyes, falls, PVD, CA, HTN, R eye blind, peripheral neuropathy osteoporosis, anxiety, arthritis, ACDF, BIL knee surg  Clinical Impression  Patient is s/p above surgery resulting in functional limitations due to the deficits listed below (see PT Problem List).  Patient will benefit from skilled PT to increase their independence and safety with mobility to allow discharge to SNF. Requiring +2 assistance with transfers, bed mobility and stand pivot transfers.        Follow Up Recommendations SNF    Equipment Recommendations  Other (comment) (to be addressed at next level of care)    Recommendations for Other Services       Precautions / Restrictions Precautions Precautions: Shoulder;Fall Shoulder Interventions: Shoulder sling/immobilizer;Off for dressing/bathing/exercises Precaution Booklet Issued: Yes (comment) Required Braces or Orthoses: Sling Restrictions Weight Bearing Restrictions: Yes RUE Weight Bearing: Non weight bearing      Mobility  Bed Mobility Overal bed mobility: +2 for physical assistance;Needs Assistance Bed Mobility: Sit to Supine Rolling: Mod assist Sidelying to sit: Mod assist;HOB elevated   Sit to supine: +2 for physical assistance;Mod assist (assist at trunk and bilateral LEs)   General bed mobility comments: Pt educated in log roll technique requires verbal cues and mod assist for completion  Transfers Overall transfer level: Needs assistance Equipment used: 1 person hand  held assist Transfers: Sit to/from [employer] Transfers Sit to Stand: Mod assist;+2 physical assistance Stand pivot transfers: Min assist;+2 physical assistance       General transfer comment: Cues for posture and sequence needed during transfers. Shuffling steps with pivot turn to bed. Using hand held assist on Lt and gait belt assist on right  Ambulation/Gait             General Gait Details: unsafe to attempt at this time.   Stairs            Wheelchair Mobility    Modified Rankin (Stroke Patients Only)       Balance Overall balance assessment: Needs assistance Sitting-balance support: Single extremity supported Sitting balance-Leahy Scale: Poor     Standing balance support: Single extremity supported (plus assist on right at trunk) Standing balance-Leahy Scale: Poor Standing balance comment: Pt requires mod assist for standing balance and is aware of balance deficits. Pt stated i need additional rehab because of my balance                             Pertinent Vitals/Pain Pain Assessment: Faces Faces Pain Scale: Hurts little more Pain Location: Rt shoulder Pain Descriptors / Indicators: Aching Pain Intervention(s): Monitored during session;Limited activity within patient's tolerance;Repositioned    Home Living Family/patient expects to be discharged to:: Skilled nursing facility Living Arrangements: Alone                    Prior Function Level of Independence: Independent with assistive device(s)         Comments: using rw     Hand Dominance   Dominant Hand: Right    Extremity/Trunk Assessment   Upper Extremity Assessment: Generalized weakness;RUE deficits/detail  RUE: Unable to fully assess due to pain       Lower Extremity Assessment: Generalized weakness      Cervical / Trunk Assessment: Normal  Communication   Communication: No difficulties  Cognition Arousal/Alertness: Lethargic;Suspect due to  medications Behavior During Therapy: [facility] for tasks assessed/performed Overall Cognitive Status: Within Functional Limits for tasks assessed       Memory: Decreased recall of precautions;Decreased short-term memory              General Comments General comments (skin integrity, edema, etc.): Pt reports she is not think correctly. Pt is unable to recall Rt shoulder precautions using teach back method, pt appeared lethargic and was falling asleep throughout session.    Exercises Shoulder Exercises Shoulder Flexion: PROM;Right;5 reps;Seated Shoulder ABduction: PROM;Right;5 reps;Seated Shoulder External Rotation: PROM;Right;5 reps;Seated Donning/doffing sling/immobilizer: Maximal assistance Correct positioning of sling/immobilizer: Maximal assistance ROM for elbow, wrist and digits of operated UE: Supervision/safety Sling wearing schedule (on at all times/off for ADL's): Maximal assistance (unable to recall wearing schedule without cues)      Assessment/Plan    PT Assessment Patient needs continued PT services  PT Diagnosis Difficulty walking;Generalized weakness;Acute pain   PT Problem List Decreased strength;Decreased range of motion;Decreased activity tolerance;Decreased balance;Decreased mobility;Pain  PT Treatment Interventions DME instruction;Gait training;Stair training;Functional mobility training;Therapeutic activities;Therapeutic exercise;Balance training;Patient/family education   PT Goals (Current goals can be found in the Care Plan section) Acute Rehab PT Goals Patient Stated Goal: get more rehab before going home PT Goal Formulation: With patient Time For Goal Achievement: 02/03/15 Potential to Achieve Goals: Good    Frequency Min 5X/week   Barriers to discharge        Co-evaluation               End of Session Equipment Utilized During Treatment: Gait belt Activity Tolerance: Patient limited by fatigue;Patient limited by pain;Other (comment)  (instability) Patient left: in bed;with call bell/phone within reach;with SCD's reapplied Nurse Communication: Mobility status         Time: 6295-2841 PT Time Calculation (min) (ACUTE ONLY): 19 min   Charges:   PT Evaluation $Initial PT Evaluation Tier I: 1 Procedure     PT G Codes:        Cassell Clement, PT, CSCS Pager [phone] Office [phone]  01/20/2015, 11:50 AM

## 2015-01-20 NOTE — Evaluation (Signed)
Occupational Therapy Evaluation Patient Details Name: Leslie Hendricks MRN: [id number] DOB: 1941/02/19 Today's Date: 01/20/2015    History of Present Illness 74 yo female s/p arthroscopic rotator cuff repair using a double row suture bridge repair contruct, arthroscopic distal clavical resection, arthroscopic subacromial decompression and bursectomy, biceps tendon tenotomy, R glenohumeral joint diagnostic arthoscopy, and labral debridement. PMH: chronic back pain, Macular degeneration bil eyes, falls, PVD, kidney stones, CA, HTN, R eye blind, peripheral neuropathy, gout, GERD, osteoporosis, anxiety, arthritis, L foot second toe amputation, ACDF, BIL knee surg   Clinical Impression   Pt admitted to hospital due to reason stated above. Pt currently with functional limitiations due to the deficits listed below (see OT problem list). Prior to admission was independent and living alone. Pt currently requires supervision to total assistance for safety with ADLs. Pt will benefit from skilled OT to maximize safety with ADLs and balance to allow discharge to venue listed below.    Follow Up Recommendations  SNF    Equipment Recommendations  Other (comment) (TBD next venue)    Recommendations for Other Services       Precautions / Restrictions Precautions Precautions: Shoulder;Fall Shoulder Interventions: Shoulder sling/immobilizer;Off for dressing/bathing/exercises Restrictions Weight Bearing Restrictions: Yes RUE Weight Bearing: Non weight bearing      Mobility Bed Mobility Overal bed mobility: Needs Assistance Bed Mobility: Rolling;Sidelying to Sit Rolling: Mod assist Sidelying to sit: Mod assist;HOB elevated       General bed mobility comments: Pt educated in log roll technique requires verbal cues and mod assist for completion  Transfers Overall transfer level: Needs assistance   Transfers: Sit to/from Stand Sit to Stand: Mod assist;+2 physical assistance          General transfer comment: Pt unsteady with sit to stand transfer, very fearful of falling. Constant cueing for safety and encouragement through transfer. Pt able to take few steps to right to sit in chair    Balance Overall balance assessment: Needs assistance Sitting-balance support: Single extremity supported;Feet supported Sitting balance-Leahy Scale: Poor     Standing balance support: Bilateral upper extremity supported Standing balance-Leahy Scale: Zero Standing balance comment: Pt requires mod assist for standing balance and is aware of balance deficits. Pt stated i need additional rehab because of my balance                            ADL Overall ADL's : Needs assistance/impaired Eating/Feeding: Set up;Sitting Eating/Feeding Details (indicate cue type and reason): Noted pt having trouble feeding herself with LUE Grooming: Supervision/safety;Sitting   Upper Body Bathing: Moderate assistance;Cueing for safety;Cueing for sequencing;Cueing for UE precautions;Sitting   Lower Body Bathing: Maximal assistance;Cueing for safety;Cueing for sequencing;Sitting/lateral leans   Upper Body Dressing : Moderate assistance;Cueing for safety;Cueing for sequencing;Cueing for compensatory techniques;Cueing for UE precautions;Sitting Upper Body Dressing Details (indicate cue type and reason): Pt unable to don/doff sling Lower Body Dressing: Total assistance;Cueing for safety;Cueing for sequencing;Sit to/from stand   Toilet Transfer: Moderate assistance;+2 for physical assistance;Stand-pivot;BSC   Toileting- Clothing Manipulation and Hygiene: Moderate assistance;Cueing for safety;Cueing for sequencing;Sitting/lateral lean         General ADL Comments: Pt provided with handout to educate pt on ADL completion. Pt unable to don/doff sling.     Vision     Perception     Praxis      Pertinent Vitals/Pain Pain Assessment: Faces Faces Pain Scale: Hurts even more Pain Location:  Rt shoulder Pain Descriptors /  Indicators: Aching;Grimacing;Guarding Pain Intervention(s): Monitored during session;Premedicated before session;Repositioned     Hand Dominance Right   Extremity/Trunk Assessment Upper Extremity Assessment Upper Extremity Assessment: Generalized weakness;RUE deficits/detail RUE: Unable to fully assess due to pain RUE Coordination: decreased gross motor   Lower Extremity Assessment Lower Extremity Assessment: Defer to PT evaluation   Cervical / Trunk Assessment Cervical / Trunk Assessment: Normal   Communication Communication Communication: No difficulties   Cognition Arousal/Alertness: Lethargic;Suspect due to medications Behavior During Therapy: [facility] for tasks assessed/performed;Restless Overall Cognitive Status: No family/caregiver present to determine baseline cognitive functioning       Memory: Decreased recall of precautions;Decreased short-term memory             General Comments   Pt reports she is not think correctly. Pt is unable to recall Rt shoulder precautions using teach back method, pt appeared lethargic and was falling asleep throughout session.    Exercises Exercises: Shoulder    02/16/15 0900  Shoulder Exercises  Shoulder Flexion PROM;Right;5 reps;Seated  Shoulder ABduction PROM;Right;5 reps;Seated  Shoulder External Rotation PROM;Right;5 reps;Seated       Shoulder Instructions Shoulder Instructions Donning/doffing sling/immobilizer: Maximal assistance Correct positioning of sling/immobilizer: Maximal assistance ROM for elbow, wrist and digits of operated UE: Supervision/safety Sling wearing schedule (on at all times/off for ADL's): Maximal assistance (unable to recall wearing schedule without cues)    Home Living Family/patient expects to be discharged to:: Skilled nursing facility Living Arrangements: Alone                                      Prior Functioning/Environment Level of  Independence: Independent with assistive device(s)        Comments: uses RW all the time    OT Diagnosis: Generalized weakness;Cognitive deficits;Disturbance of vision;Acute pain;Altered mental status   OT Problem List: Decreased strength;Decreased range of motion;Decreased activity tolerance;Impaired balance (sitting and/or standing);Impaired vision/perception;Decreased coordination;Decreased cognition;Decreased knowledge of precautions;Pain   OT Treatment/Interventions: Self-care/ADL training;Therapeutic exercise;Energy conservation;Therapeutic activities;Cognitive remediation/compensation;Visual/perceptual remediation/compensation;Patient/family education    OT Goals(Current goals can be found in the care plan section) Acute Rehab OT Goals Patient Stated Goal: none stated OT Goal Formulation: With patient Time For Goal Achievement: 02/03/15 Potential to Achieve Goals: Fair ADL Goals Pt Will Perform Grooming: with min assist;sitting Pt Will Perform Upper Body Bathing: with mod assist;sitting Pt Will Perform Lower Body Bathing: with max assist;sitting/lateral leans Pt Will Perform Upper Body Dressing: with mod assist;sitting Pt Will Perform Lower Body Dressing: with total assist;sit to/from stand Pt Will Transfer to Toilet: with mod assist;stand pivot transfer;bedside commode Pt Will Perform Toileting - Clothing Manipulation and hygiene: sitting/lateral leans;with min assist Pt Will Perform Tub/Shower Transfer: Tub transfer;with mod assist;Stand pivot transfer;3 in 1 Additional ADL Goal #1: Pt will verbalize 2 out of 2 precautions with min verbal cues  OT Frequency: Min 2X/week   Barriers to D/C:            Co-evaluation              End of Session Equipment Utilized During Treatment: Gait belt;Rolling walker;Oxygen (sling)  Activity Tolerance: Patient limited by fatigue;Patient limited by lethargy Patient left: in chair;with call bell/phone within reach   Time:  0830-0916 OT Time Calculation (min): 46 min Charges:    G-Codes:    Lin Landsman 02/16/15, 11:38 AM

## 2015-01-20 NOTE — Plan of Care (Signed)
Problem: Consults Goal: Diagnosis - Shoulder Surgery Total Shoulder Arthroplasty     

## 2015-01-20 NOTE — Progress Notes (Signed)
Occupational Therapy Treatment Patient Details Name: Leslie Hendricks MRN: [id number] DOB: 1940/09/29 Today's Date: 01/20/2015    History of present illness 74 yo female s/p arthroscopic rotator cuff repair using a double row suture bridge repair contruct, arthroscopic distal clavical resection, arthroscopic subacromial decompression and bursectomy, biceps tendon tenotomy, R glenohumeral joint diagnostic arthoscopy, and labral debridement. PMH: chronic back pain, Macular degeneration bil eyes, falls, PVD, CA, HTN, R eye blind, peripheral neuropathy osteoporosis, anxiety, arthritis, ACDF, BIL knee surg   OT comments  Focus of session included reviewing over precautions for Rt shoulder and PROM exercises. Pt unable to recall precautions and requires verbal cues for reminders. Therapy session limited due to pt still appearing lethargic and falling asleep during session.  Follow Up Recommendations  SNF    Equipment Recommendations  Other (comment) TBD next venue   Recommendations for Other Services      Precautions / Restrictions Precautions Precautions: Shoulder;Fall Shoulder Interventions: Shoulder sling/immobilizer;Off for dressing/bathing/exercises Precaution Booklet Issued: Yes (comment) Required Braces or Orthoses: Sling Restrictions Weight Bearing Restrictions: Yes RUE Weight Bearing: Non weight bearing       Mobility Bed Mobility Overal bed mobility: Needs Assistance Bed Mobility: Rolling;Sidelying to Sit Rolling: Mod assist Sidelying to sit: Mod assist;HOB elevated       General bed mobility comments: Pt educated in log roll technique requires verbal cues and mod assist for completion  Transfers Overall transfer level: Needs assistance   Transfers: Sit to/from Stand Sit to Stand: Mod assist;+2 physical assistance         General transfer comment: Pt unsteady with sit to stand transfer, very fearful of falling. Constant cueing for safety and encouragement through  transfer. Pt able to take few steps to right to sit in chair    Balance Overall balance assessment: Needs assistance Sitting-balance support: Single extremity supported;Feet supported Sitting balance-Leahy Scale: Poor     Standing balance support: Bilateral upper extremity supported Standing balance-Leahy Scale: Zero Standing balance comment: Pt requires mod assist for standing balance and is aware of balance deficits. Pt stated i need additional rehab because of my balance                   ADL Overall ADL's : Needs assistance/impaired  Upper Body Dressing : Maximal assistance;Cueing for safety;Cueing for sequencing;Cueing for compensatory techniques;Cueing for UE precautions;Sitting Upper Body Dressing Details (indicate cue type and reason): Pt unable to don/doff sling             General ADL Comments:Pt still unable to don/doff sling and requires verbal cues for safety.      Vision                     Perception     Praxis      Cognition   Behavior During Therapy: [facility] for tasks assessed/performed;Restless Overall Cognitive Status: No family/caregiver present to determine baseline cognitive functioning       Memory: Decreased recall of precautions;Decreased short-term memory               Extremity/Trunk Assessment  Upper Extremity Assessment Upper Extremity Assessment: Generalized weakness;RUE deficits/detail RUE: Unable to fully assess due to pain RUE Coordination: decreased gross motor   Lower Extremity Assessment Lower Extremity Assessment: Defer to PT evaluation   Cervical / Trunk Assessment Cervical / Trunk Assessment: Normal    Exercises Shoulder Exercises Shoulder Flexion: PROM;Right;5 reps;Seated Shoulder ABduction: PROM;Right;5 reps;Seated Shoulder External Rotation: PROM;Right;5 reps;Seated Donning/doffing sling/immobilizer: Maximal assistance Correct  positioning of sling/immobilizer: Maximal assistance ROM for elbow,  wrist and digits of operated UE: Supervision/safety Sling wearing schedule (on at all times/off for ADL's): Maximal assistance (unable to recall wearing schedule without cues)   Shoulder Instructions Shoulder Instructions Donning/doffing sling/immobilizer: Maximal assistance Correct positioning of sling/immobilizer: Maximal assistance ROM for elbow, wrist and digits of operated UE: Supervision/safety Sling wearing schedule (on at all times/off for ADL's): Maximal assistance (unable to recall wearing schedule without cues)     General Comments      Pertinent Vitals/ Pain       Pain Assessment: Faces Faces Pain Scale: Hurts even more Pain Location: Rt shoulder Pain Descriptors / Indicators: Throbbing;Sore Pain Intervention(s): Monitored during session;Repositioned;Ice applied  Home Living Family/patient expects to be discharged to:: Skilled nursing facility Living Arrangements: Alone                                      Prior Functioning/Environment Level of Independence: Independent with assistive device(s)        Comments: uses RW all the time   Frequency Min 2X/week     Progress Toward Goals  OT Goals(current goals can now be found in the care plan section)  Progress towards OT goals: Not progressing toward goals - comment  Acute Rehab OT Goals Patient Stated Goal: none stated OT Goal Formulation: With patient Time For Goal Achievement: 02/03/15 Potential to Achieve Goals: Fair ADL Goals Pt Will Perform Grooming: with min assist;sitting Pt Will Perform Upper Body Bathing: with mod assist;sitting Pt Will Perform Lower Body Bathing: with max assist;sitting/lateral leans Pt Will Perform Upper Body Dressing: with mod assist;sitting Pt Will Perform Lower Body Dressing: with total assist;sit to/from stand Pt Will Transfer to Toilet: with mod assist;stand pivot transfer;bedside commode Pt Will Perform Toileting - Clothing Manipulation and hygiene:  sitting/lateral leans;with min assist Pt Will Perform Tub/Shower Transfer: Tub transfer;with mod assist;Stand pivot transfer;3 in 1 Additional ADL Goal #1: Pt will verbalize 2 out of 2 precautions with min verbal cues  Plan Discharge plan remains appropriate    Co-evaluation                 End of Session Equipment Utilized During Treatment: Oxygen   Activity Tolerance Patient limited by fatigue;Patient limited by lethargy;Patient limited by pain   Patient Left in bed;with call bell/phone within reach;with bed alarm set   Nurse Communication          Time: 9735-3299 OT Time Calculation (min): 28 min  Charges:    Lin Landsman 01/20/2015, 11:46 AM

## 2015-01-20 NOTE — Progress Notes (Signed)
Leslie Hendricks  MRN: [id number] DOB/Age: 76-Sep-1942 76 y.o. Physician: Ander Slade, Leslie.D. 1 Day Post-Op Procedure(s) (LRB): RIGHT SHOULDER ARTHROSCOPY WITH SUBACROMIAL DECOMPRESSION, POSSIBLE ROTATOR CUFF REPAIR  (Right)  Subjective: Reports restless night, fair pain control Vital Signs Temp:  [97.5 F (36.4 C)-98.3 F (36.8 C)] 98.3 F (36.8 C) (10/07 0600) Pulse Rate:  [57-88] 82 (10/07 0600) Resp:  [11-18] 16 (10/07 0600) BP: (93-111)/(38-49) 103/49 mmHg (10/07 0600) SpO2:  [95 %-100 %] 100 % (10/07 0600)  Lab Results No results for input(s): WBC, HGB, HCT, PLT in the last 72 hours. BMET No results for input(s): NA, K, CL, CO2, GLUCOSE, BUN, CREATININE, CALCIUM in the last 72 hours. INR  Date Value Ref Range Status  01/10/2015 0.99 0.00 - 1.49 Final     Exam  A and O dressings dry, grossly n/v intact  Plan SW consult for d/c planning. Patient requests SNF up in [place].  Leslie Hendricks Leslie Hendricks 01/20/2015, 8:28 AM    Contact # [phone]

## 2015-01-20 NOTE — Anesthesia Postprocedure Evaluation (Signed)
  Anesthesia Post-op Note  Patient: Leslie Hendricks  Procedure(s) Performed: Procedure(s) (LRB): RIGHT SHOULDER ARTHROSCOPY WITH SUBACROMIAL DECOMPRESSION, POSSIBLE ROTATOR CUFF REPAIR  (Right)  Patient Location: PACU  Anesthesia Type: GA combined with regional for post-op pain  Level of Consciousness: awake and alert   Airway and Oxygen Therapy: Patient Spontanous Breathing  Post-op Pain: mild  Post-op Assessment: Post-op Vital signs reviewed, Patient's Cardiovascular Status Stable, Respiratory Function Stable, Patent Airway and No signs of Nausea or vomiting  Last Vitals:  Filed Vitals:   01/20/15 0600  BP: 103/49  Pulse: 82  Temp: 36.8 C  Resp: 16    Post-op Vital Signs: stable   Complications: No apparent anesthesia complications

## 2015-01-20 NOTE — Clinical Social Work Note (Signed)
Clinical Social Work Assessment  Patient Details  Name: Leslie Hendricks MRN: [id number] Date of Birth: Nov 17, 1940  Date of referral:  01/20/15               Reason for consult:  Facility Placement, Discharge Planning                Permission sought to share information with:  Facility Sport and exercise psychologist, Family Supports Permission granted to share information::  Yes, Verbal Permission Granted  Name::     [name]  Agency::  [facility]  Relationship::  Sister  Contact Information:  No information provided.  Housing/Transportation Living arrangements for the past 2 months:  [place] of Information:  Patient Patient Interpreter Needed:  None Criminal Activity/Legal Involvement Pertinent to Current Situation/Hospitalization:  No - Comment as needed Significant Relationships:  Siblings Lives with:  Self Do you feel safe going back to the place where you live?  No (High fall risk.) Need for family participation in patient care:  No (Coment) (Patietn able to make own decisions.)  Care giving concerns:  Patient expressed no concerns regarding discharge.   Social Worker assessment / plan:  CSW received referral for possible SNF placement at time of discharge. CSW met with patient at bedside to discuss discharge disposition planning. Per patient, patient understanding and agreeable to PT recommendation for SNF placement. Patient expressed interest in SNF in [place] [facility]). CSW to continue to follow and assist with discharge planning needs.  Employment status:  Retired Forensic scientist:  Medicare PT Recommendations:  [place] / Referral to community resources:  [place]  Patient/Family's Response to care:  Patient understanding and agreeable to CSW plan of care.  Patient/Family's Understanding of and Emotional Response to Diagnosis, Current Treatment, and Prognosis:  Patient understanding and  agreeable to CSW plan of care.  Emotional Assessment Appearance:  Appears stated age Attitude/Demeanor/Rapport:  Lethargic, Other (Pleasant.) Affect (typically observed):  Accepting, Appropriate, Pleasant, Quiet Orientation:  Oriented to Self, Oriented to Place, Oriented to  Time, Oriented to Situation Alcohol / Substance use:  Not Applicable Psych involvement (Current and /or in the community):  No (Comment) (Not appropriate on this admission.)  Discharge Needs  Concerns to be addressed:  No discharge needs identified Readmission within the last 30 days:  No Current discharge risk:  None Barriers to Discharge:  No Barriers Identified   Caroline Sauger, LCSW 01/20/2015, 4:08 PM [phone]

## 2015-01-20 NOTE — Progress Notes (Signed)
Utilization review completed.  

## 2015-01-20 NOTE — Clinical Social Work Placement (Signed)
   CLINICAL SOCIAL WORK PLACEMENT  NOTE  Date:  01/20/2015  Patient Details  Name: PATIRICA LONGSHORE MRN: [id number] Date of Birth: 08/08/1940  Clinical Social Work is seeking post-discharge placement for this patient at the [place] level of care (*CSW will initial, date and re-position this form in  chart as items are completed):  Yes   Patient/family provided with [place] Work Department's list of facilities offering this level of care within the geographic area requested by the patient (or if unable, by the patient's family).  Yes   Patient/family informed of their freedom to choose among providers that offer the needed level of care, that participate in Medicare, Medicaid or managed care program needed by the patient, have an available bed and are willing to accept the patient.  Yes   Patient/family informed of [facility]'s ownership interest in [facility] and [facility], as well as of the fact that they are under no obligation to receive care at these facilities.  PASRR submitted to EDS on  (n/a)     PASRR number received on  (n/a)     Existing PASRR number confirmed on 01/20/15     FL2 transmitted to all facilities in geographic area requested by pt/family on 01/20/15     FL2 transmitted to all facilities within larger geographic area on       Patient informed that his/her managed care company has contracts with or will negotiate with certain facilities, including the following:            Patient/family informed of bed offers received.  Patient chooses bed at       Physician recommends and patient chooses bed at      Patient to be transferred to   on  .  Patient to be transferred to facility by       Patient family notified on   of transfer.  Name of family member notified:        PHYSICIAN Please sign FL2     Additional Comment:    _______________________________________________ Caroline Sauger, LCSW 01/20/2015,  4:10 PM

## 2015-01-21 LAB — GLUCOSE, CAPILLARY
Glucose-Capillary: 95 mg/dL (ref 65–99)
Glucose-Capillary: 143 mg/dL — ABNORMAL HIGH (ref 65–99)
Glucose-Capillary: 133 mg/dL — ABNORMAL HIGH (ref 65–99)
Glucose-Capillary: 109 mg/dL — ABNORMAL HIGH (ref 65–99)

## 2015-01-21 NOTE — Discharge Summary (Signed)
Physician Discharge Summary   Patient ID: Leslie Hendricks MRN: [id number] DOB/AGE: 76-Nov-1942 73 y.o.  Admit date: 01/19/2015 Discharge date: 01/21/2015  Admission Diagnoses:  Active Problems:   S/P rotator cuff repair   Discharge Diagnoses:  Same   Surgeries: Procedure(s): RIGHT SHOULDER ARTHROSCOPY WITH SUBACROMIAL DECOMPRESSION, POSSIBLE ROTATOR CUFF REPAIR  on 01/19/2015   Consultants: PT/OT, Social work  Discharged Condition: Stable  Hospital Course: Leslie Hendricks is an 74 y.o. female who was admitted 01/19/2015 with a chief complaint of No chief complaint on file. , and found to have a diagnosis of <principal problem not specified>.  They were brought to the operating room on 01/19/2015 and underwent the above named procedures.    The patient had an uncomplicated hospital course and was stable for discharge.  Recent vital signs:  Filed Vitals:   01/21/15 0613  BP: 134/45  Pulse: 75  Temp: 98.5 F (36.9 C)  Resp: 16    Recent laboratory studies:  Results for orders placed or performed during the hospital encounter of 01/19/15  Glucose, capillary  Result Value Ref Range   Glucose-Capillary 97 65 - 99 mg/dL  Glucose, capillary  Result Value Ref Range   Glucose-Capillary 127 (H) 65 - 99 mg/dL   Comment 1 Notify RN    Comment 2 Document in Chart   Glucose, capillary  Result Value Ref Range   Glucose-Capillary 93 65 - 99 mg/dL  Glucose, capillary  Result Value Ref Range   Glucose-Capillary 108 (H) 65 - 99 mg/dL  Glucose, capillary  Result Value Ref Range   Glucose-Capillary 107 (H) 65 - 99 mg/dL  Glucose, capillary  Result Value Ref Range   Glucose-Capillary 89 65 - 99 mg/dL  Glucose, capillary  Result Value Ref Range   Glucose-Capillary 104 (H) 65 - 99 mg/dL  Glucose, capillary  Result Value Ref Range   Glucose-Capillary 140 (H) 65 - 99 mg/dL  Glucose, capillary  Result Value Ref Range   Glucose-Capillary 95 65 - 99 mg/dL    Discharge  Medications:     Medication List    TAKE these medications        acetaminophen 325 MG tablet  Commonly known as:  TYLENOL  Take 2 tablets (650 mg total) by mouth every 6 (six) hours as needed for mild pain (or Fever >/= 101).     aspirin EC 325 MG tablet  Take 325 mg by mouth daily.     atorvastatin 80 MG tablet  Commonly known as:  LIPITOR  Take 80 mg by mouth at bedtime.     bisacodyl 10 MG suppository  Commonly known as:  DULCOLAX  Place 1 suppository (10 mg total) rectally daily as needed for moderate constipation.     CALCIUM 600/VITAMIN D3 600-800 MG-UNIT Tabs  Generic drug:  Calcium Carb-Cholecalciferol  Take 1 tablet by mouth daily.     Cyanocobalamin 1000 MCG Tbcr  Take 1 tablet by mouth daily. B12     diazepam 5 MG tablet  Commonly known as:  VALIUM  Take 1 tablet (5 mg total) by mouth at bedtime.     docusate sodium 100 MG capsule  Commonly known as:  COLACE  Take 200 mg by mouth at bedtime.     ferrous sulfate 325 (65 FE) MG tablet  Take 325 mg by mouth daily with breakfast.     folic acid 027 MCG tablet  Commonly known as:  FOLVITE  Take 400 mcg by mouth daily.  furosemide 20 MG tablet  Commonly known as:  LASIX  Take 20 mg by mouth daily.     HYDROcodone-acetaminophen 10-325 MG tablet  Commonly known as:  NORCO  Take 1-2 tablets by mouth every 4 (four) hours as needed for moderate pain (Prefers to take at bedtime).     losartan-hydrochlorothiazide 50-12.5 MG tablet  Commonly known as:  HYZAAR  Take 1 tablet by mouth every morning.     Magnesium 250 MG Tabs  Take 1 tablet by mouth daily.     metFORMIN 500 MG 24 hr tablet  Commonly known as:  GLUCOPHAGE-XR  Take 500 mg by mouth daily with breakfast.     methocarbamol 500 MG tablet  Commonly known as:  ROBAXIN  Take 1 tablet (500 mg total) by mouth every 6 (six) hours as needed for muscle spasms.     omega-3 fish oil 1000 MG Caps capsule  Commonly known as:  MAXEPA  Take 1 capsule by  mouth daily.     polyethylene glycol packet  Commonly known as:  MIRALAX / GLYCOLAX  Take 17 g by mouth daily as needed for mild constipation.     Sennosides 25 MG Tabs  Take 50 mg by mouth at bedtime.     SYSTANE OP  Place 1 drop into both eyes 2 (two) times daily.     venlafaxine XR 75 MG 24 hr capsule  Commonly known as:  EFFEXOR-XR  Take 75 mg by mouth daily with breakfast.     VISION FORMULA/LUTEIN PO  Take 1 capsule by mouth daily.     VITAMIN C-ROSE HIPS CR 1000 MG Tbcr  Take 1 tablet by mouth daily.     Vitamin D-3 1000 UNITS Caps  Take 1 capsule by mouth daily.     vitamin E 400 UNIT capsule  Take 400 Units by mouth daily.     zinc gluconate 50 MG tablet  Take 50 mg by mouth daily.        Diagnostic Studies: No results found.  Disposition: 03-Skilled Nursing Facility        Follow-up Information    Follow up with Metta Clines SUPPLE, MD.   Specialty:  Orthopedic Surgery   Why:  call to be seen in 10-14 days   Contact information:   [address] [place] [address] [phone]        Signed: Ventura Bruns 01/21/2015, 7:18 AM

## 2015-01-21 NOTE — Progress Notes (Signed)
Per report from day shift RN, MD aware that pt has not voided on own since prior to surgery on 01/19/15. Pt has been in and out cath'd 3 times since admission (see flowsheet). Instructions received during report are for nursing staff to not place foley but instead to continue in and cath as necessary (q6 hr per policy).  Pt again due to void by 0030 on 01/21/15. Pt stated she felt the urge to void and made the attempt with no success. Bladder scan revealed 785cc urine in bladder. In and out cath performed; 750cc urine removed. PO fluids encouraged, IV fluids continue to infuse. Nursing will continue to monitor.

## 2015-01-21 NOTE — Progress Notes (Signed)
   Subjective: 2 Days Post-Op Procedure(s) (LRB): RIGHT SHOULDER ARTHROSCOPY WITH SUBACROMIAL DECOMPRESSION, POSSIBLE ROTATOR CUFF REPAIR  (Right)  Pt resting comfortably in no acute distress Noted issues with voiding Plan for SNF placement as early as today Patient reports pain as mild.  Objective:   VITALS:   Filed Vitals:   01/21/15 0613  BP: 134/45  Pulse: 75  Temp: 98.5 F (36.9 C)  Resp: 16    Right shoulder dressing and sling intact nv intact distally No rashes or edema  LABS No results for input(s): HGB, HCT, WBC, PLT in the last 72 hours.  No results for input(s): NA, K, BUN, CREATININE, GLUCOSE in the last 72 hours.   Assessment/Plan: 2 Days Post-Op Procedure(s) (LRB): RIGHT SHOULDER ARTHROSCOPY WITH SUBACROMIAL DECOMPRESSION, POSSIBLE ROTATOR CUFF REPAIR  (Right) Plan to d/c to SNF once bed available F/u in 2 weeks with Dr. Katharine Look, MPAS, PA-C  01/21/2015, 7:16 AM

## 2015-01-21 NOTE — Care Management Important Message (Signed)
Important Message  Patient Details  Name: Leslie Hendricks MRN: [id number] Date of Birth: 11-05-1940   Medicare Important Message Given:  Yes-second notification given    Norina Buzzard, RN 01/21/2015, 9:34 AM

## 2015-01-21 NOTE — Clinical Social Work Note (Signed)
Patient has a bed at [facility] [place]. Patient notified at bedside. Patient will likely discharge tomorrow, 10/9. Facility aware and holding bed.   CSW remains available as needed.  Glendon Axe, MSW, LCSWA [phone] 01/21/2015 12:42 PM

## 2015-01-21 NOTE — Clinical Social Work Note (Signed)
Clinical Social Worker met with patient at bedside to present bed offers. Patient reported she is NOT pleased with bed offers given and stated she has pre-registered with [facility] [place]. CSW shared that [facility] has not extended a bed offer at this time.   CSW has contacted [facility] who will review patient's information and contact CSW once bed offer has been made.   Per MD, patient will likely discharge tomorrow, 10/9.   CSW remains available as needed.  Glendon Axe, MSW, [place] [phone] 01/21/2015 12:05 PM

## 2015-01-21 NOTE — Progress Notes (Signed)
Occupational Therapy Treatment Patient Details Name: Leslie Hendricks MRN: [id number] DOB: 1940/10/06 Today's Date: 01/21/2015    History of present illness 74 yo female s/p arthroscopic rotator cuff repair using a double row suture bridge repair contruct, arthroscopic distal clavical resection, arthroscopic subacromial decompression and bursectomy, biceps tendon tenotomy, R glenohumeral joint diagnostic arthoscopy, and labral debridement. PMH: chronic back pain, Macular degeneration bil eyes, falls, PVD, CA, HTN, R eye blind, peripheral neuropathy osteoporosis, anxiety, arthritis, ACDF, BIL knee surg   OT comments  Focus of session was functional mobility through completion of stand pivot transfers and bed mobility.  Pt. Requires mod/max a for all movements with max instructional and tactile guidance.  Anxious and notable short term memory deficits.  Unable to tolerate RUE rom due to pain.  Will continue to follow acutely.  Agree with SNF for d/c.    Follow Up Recommendations  SNF    Equipment Recommendations       Recommendations for Other Services      Precautions / Restrictions Precautions Precautions: Shoulder;Fall Shoulder Interventions: Shoulder sling/immobilizer;Off for dressing/bathing/exercises Required Braces or Orthoses: Sling Restrictions RUE Weight Bearing: Non weight bearing       Mobility Bed Mobility Overal bed mobility: Needs Assistance Bed Mobility: Supine to Sit;Rolling;Sidelying to Sit Rolling: Mod assist;Max assist Sidelying to sit: Mod assist;Max assist Supine to sit: Mod assist;Max assist     General bed mobility comments: Pt educated in log roll technique requires verbal cues and mod assist for completion  Transfers Overall transfer level: Needs assistance Equipment used: 1 person hand held assist Transfers: Sit to/from [employer] Transfers Sit to Stand: Mod assist Stand pivot transfers: Mod assist       General transfer comment: Cues  for posture and sequence needed during transfers. Shuffling steps with pivot turn to bed. Using hand held assist    Balance                                   ADL Overall ADL's : Needs assistance/impaired Eating/Feeding: Set up;Sitting               Upper Body Dressing : Moderate assistance;Cueing for safety;Cueing for sequencing;Cueing for compensatory techniques;Cueing for UE precautions;Sitting   Lower Body Dressing: Total assistance;Cueing for safety;Cueing for sequencing;Sit to/from stand   Toilet Transfer: Moderate assistance;Stand-pivot;Cueing for safety;Cueing for sequencing Toilet Transfer Details (indicate cue type and reason): simulated with transfer from eob to [place] and Hygiene: Moderate assistance;Cueing for safety;Cueing for sequencing;Sitting/lateral lean Toileting - Clothing Manipulation Details (indicate cue type and reason): simulated during pivot to recliner              Vision                     Perception     Praxis      Cognition   Behavior During Therapy: [facility] for tasks assessed/performed;Anxious Overall Cognitive Status: Within Functional Limits for tasks assessed       Memory: Decreased recall of precautions;Decreased short-term memory (pt. would agree to something or verbalize "okay" then forget within 3 min. and then ask "what are you doing")               Extremity/Trunk Assessment               Exercises     Shoulder Instructions       General Comments  Pertinent Vitals/ Pain       Pain Assessment: No/denies pain (grimacing during bed mobility) Pain Location: right shoulder Pain Descriptors / Indicators: Aching Pain Intervention(s): Monitored during session;Limited activity within patient's tolerance;Repositioned  Home Living                                          Prior Functioning/Environment              Frequency Min  2X/week     Progress Toward Goals  OT Goals(current goals can now be found in the care plan section)  Progress towards OT goals: Progressing toward goals     Plan Discharge plan remains appropriate    Co-evaluation                 End of Session Equipment Utilized During Treatment: Oxygen;Gait belt   Activity Tolerance Patient tolerated treatment well   Patient Left in chair;with call bell/phone within reach   Nurse Communication Other (comment) (discussed with rn multiple red indentions on B LES from TED hose, removed TED hose and had RN check pts. les)        Time: 1610-9604 OT Time Calculation (min): 23 min  Charges: OT General Charges $OT Visit: 1 Procedure OT Treatments $Self Care/Home Management : 23-37 mins  Janice Coffin, COTA/L 01/21/2015, 9:01 AM

## 2015-01-22 LAB — GLUCOSE, CAPILLARY
Glucose-Capillary: 123 mg/dL — ABNORMAL HIGH (ref 65–99)
Glucose-Capillary: 139 mg/dL — ABNORMAL HIGH (ref 65–99)
Glucose-Capillary: 116 mg/dL — ABNORMAL HIGH (ref 65–99)
Glucose-Capillary: 151 mg/dL — ABNORMAL HIGH (ref 65–99)

## 2015-01-22 MED ORDER — MAGNESIUM CITRATE PO SOLN
0.5000 | Freq: Once | ORAL | Status: AC
  Administered 2015-01-22: 0.5 via ORAL
  Filled 2015-01-22: qty 296

## 2015-01-22 NOTE — Progress Notes (Signed)
[facility] unable to admit pt today due to time constraints.  However, they will be expecting her in the am.  Unit CSW to f/u.

## 2015-01-22 NOTE — Progress Notes (Signed)
CM notes that plan for discharge is for patient to go to SNF possibly today. SW following for discharge planning at this time. CM remains available shoulder needs arise.

## 2015-01-22 NOTE — Progress Notes (Signed)
    Subjective: 3 Days Post-Op Procedure(s) (LRB): RIGHT SHOULDER ARTHROSCOPY WITH SUBACROMIAL DECOMPRESSION, POSSIBLE ROTATOR CUFF REPAIR  (Right) Patient reports pain as 3 on 0-10 scale.   Denies CP or SOB.  Voiding with difficulty  Requiring in/out cath x 5 now with foley. Positive flatus. Objective: Vital signs in last 24 hours: Temp:  [97.7 F (36.5 C)-98.9 F (37.2 C)] 98.9 F (37.2 C) (10/09 0615) Pulse Rate:  [72-80] 80 (10/09 0615) Resp:  [16-18] 17 (10/09 0615) BP: (127-146)/(47-57) 146/57 mmHg (10/09 0615) SpO2:  [95 %-100 %] 95 % (10/09 0615)  Intake/Output from previous day: 10/08 0701 - 10/09 0700 In: 240 [P.O.:240] Out: 1200 [Urine:1200] Intake/Output this shift:    Labs: No results for input(s): HGB in the last 72 hours. No results for input(s): WBC, RBC, HCT, PLT in the last 72 hours. No results for input(s): NA, K, CL, CO2, BUN, CREATININE, GLUCOSE, CALCIUM in the last 72 hours. No results for input(s): LABPT, INR in the last 72 hours.  Physical Exam: Neurologically intact Intact pulses distally Incision: dressing C/D/I Compartment soft  Assessment/Plan: 3 Days Post-Op Procedure(s) (LRB): RIGHT SHOULDER ARTHROSCOPY WITH SUBACROMIAL DECOMPRESSION, POSSIBLE ROTATOR CUFF REPAIR  (Right) Patient with urinary retention.  No prior history of urinary issues.  Will call urology consult per families request for further evaluation MagCitrate/fleets for constipation SNF d/c pending urology eval & recommendations.  Melina Schools D for Dr. Melina Schools [facility] [phone] 01/22/2015, 8:09 AM

## 2015-01-22 NOTE — Progress Notes (Signed)
Spoke with Leveda Anna, SW regarding patient discharge.  Informed she would contact facility to determine if she could be accepted today.  Original delay was related to patient's and family's request for urology consult due to urinary retention prior to discharge.

## 2015-01-23 LAB — GLUCOSE, CAPILLARY
Glucose-Capillary: 110 mg/dL — ABNORMAL HIGH (ref 65–99)
Glucose-Capillary: 143 mg/dL — ABNORMAL HIGH (ref 65–99)

## 2015-01-23 NOTE — Progress Notes (Signed)
Occupational Therapy Treatment Patient Details Name: Leslie Hendricks MRN: [id number] DOB: March 01, 1941 Today's Date: 01/23/2015    History of present illness 74 yo female s/p arthroscopic rotator cuff repair using a double row suture bridge repair contruct, arthroscopic distal clavical resection, arthroscopic subacromial decompression and bursectomy, biceps tendon tenotomy, R glenohumeral joint diagnostic arthoscopy, and labral debridement. PMH: chronic back pain, Macular degeneration bil eyes, falls, PVD, CA, HTN, R eye blind, peripheral neuropathy osteoporosis, anxiety, arthritis, ACDF, BIL knee surg   OT comments  Pt making slow progress toward OT goals. Pt very lethargic today, falling asleep throughout session. Pt with decreased ROM in R UE, pain limiting ability to participate. AAROM for elbow, wrist, hand due to pt having difficulty performing AROM. Limited PROM to shoulder (FF, ABD, ER). Recommendation for SNF remains appropriate at this time. Continue to follow pt acutely.    Follow Up Recommendations  SNF;Supervision/Assistance - 24 hour    Equipment Recommendations  Other (comment) (TBD next venue)    Recommendations for Other Services      Precautions / Restrictions Precautions Precautions: Shoulder;Fall Type of Shoulder Precautions: Passive protocol: PROM FF 90, ABD 60, ER 30. AROM elbow/wrist/hand ok Shoulder Interventions: Shoulder sling/immobilizer;Off for dressing/bathing/exercises Precaution Booklet Issued: No Required Braces or Orthoses: Sling Restrictions Weight Bearing Restrictions: Yes RUE Weight Bearing: Non weight bearing       Mobility Bed Mobility Overal bed mobility: Needs Assistance Bed Mobility: Rolling Rolling: Total assist (Pt unable to roll to L to position pillow under RUE )            Transfers                      Balance                                   ADL                                                 Vision                     Perception     Praxis      Cognition   Behavior During Therapy: [facility] for tasks assessed/performed;Anxious Overall Cognitive Status: Within Functional Limits for tasks assessed       Memory: Decreased recall of precautions;Decreased short-term memory               Extremity/Trunk Assessment               Exercises Shoulder Exercises Shoulder Flexion: PROM;Right;5 reps;Supine (0-20 degrees) Shoulder ABduction: PROM;Right;5 reps;Supine (0-10 degrees) Shoulder External Rotation: PROM;Right;5 reps;Supine (0-30 degrees ) Elbow Flexion: AAROM;Right;5 reps;Supine (Pt with difficulty performing AROM, assisted pt through PROM) Wrist Flexion: AAROM;Right;5 reps;Supine (Pt with difficulty performing AROM, assisted pt through PROM) Digit Composite Flexion: AAROM;Right;5 reps;Supine (Pt with difficulty performing AROM, assisted pt through PROM) Donning/doffing sling/immobilizer: Maximal assistance Correct positioning of sling/immobilizer: Maximal assistance ROM for elbow, wrist and digits of operated UE: Maximal assistance Sling wearing schedule (on at all times/off for ADL's): Maximal assistance Proper positioning of operated UE when showering: Maximal assistance Positioning of UE while sleeping: Maximal assistance   Shoulder Instructions Shoulder Instructions Donning/doffing sling/immobilizer: Maximal assistance Correct positioning of sling/immobilizer: Maximal assistance ROM  for elbow, wrist and digits of operated UE: Maximal assistance Sling wearing schedule (on at all times/off for ADL's): Maximal assistance Proper positioning of operated UE when showering: Maximal assistance Positioning of UE while sleeping: Maximal assistance     General Comments      Pertinent Vitals/ Pain       Pain Assessment: 0-10 Pain Score: 7  Pain Location: R shoulder Pain Descriptors / Indicators: Sore;Sharp Pain Intervention(s):  Limited activity within patient's tolerance;Monitored during session;Repositioned;Ice applied  Home Living                                          Prior Functioning/Environment              Frequency Min 2X/week     Progress Toward Goals  OT Goals(current goals can now be found in the care plan section)  Progress towards OT goals: Progressing toward goals  Acute Rehab OT Goals Patient Stated Goal: none stated ADL Goals Pt/caregiver will Perform Home Exercise Program: Increased ROM;Right Upper extremity;With minimal assist;With written HEP provided  Plan Discharge plan remains appropriate    Co-evaluation                 End of Session Equipment Utilized During Treatment: Oxygen;Other (comment) (sling)   Activity Tolerance Patient limited by lethargy;Patient limited by pain   Patient Left in bed;with call bell/phone within reach   Nurse Communication          Time: 1102-1120 OT Time Calculation (min): 18 min  Charges: OT General Charges $OT Visit: 1 Procedure OT Treatments $Therapeutic Exercise: 8-22 mins  Binnie Kand M.S., OTR/L Pager: [phone]  01/23/2015, 11:30 AM

## 2015-01-23 NOTE — Clinical Social Work Placement (Signed)
   CLINICAL SOCIAL WORK PLACEMENT  NOTE  Date:  01/23/2015  Patient Details  Name: Leslie Hendricks MRN: [id number] Date of Birth: [birth year]  Clinical Social Work is seeking post-discharge placement for this patient at the [facility] level of care (*CSW will initial, date and re-position this form in  chart as items are completed):  Yes   Patient/family provided with [facility]'s list of facilities offering this level of care within the geographic area requested by the patient (or if unable, by the patient's family).  Yes   Patient/family informed of their freedom to choose among providers that offer the needed level of care, that participate in Medicare, Medicaid or managed care program needed by the patient, have an available bed and are willing to accept the patient.  Yes   Patient/family informed of [facility]'s ownership interest in [facility] and [facility]: [facility], as well as of the fact that they are under no obligation to receive care at these facilities.  PASRR submitted to EDS on  (n/a)     PASRR number received on  (n/a)     Existing PASRR number confirmed on 01/20/15     FL2 transmitted to all facilities in geographic area requested by pt/family on 01/20/15     FL2 transmitted to all facilities within larger geographic area on       Patient informed that his/her managed care company has contracts with or will negotiate with certain facilities, including the following:        Yes   Patient/family informed of bed offers received.  Patient chooses bed at [facility]     Physician recommends and patient chooses bed at      Patient to be transferred to [facility] on 01/23/15.  Patient to be transferred to facility by [facility]     Patient family notified on 01/23/15 of transfer.  Name of family member notified:  Patient at bedside.     PHYSICIAN       Additional Comment:     _______________________________________________ Caroline Sauger, LCSW 01/23/2015, 12:23 PM

## 2015-01-23 NOTE — Discharge Planning (Signed)
Patient to be discharged to [facility]. Patient updated regarding discharge.  Facility: [facility] RN report number: [phone] Transportation: EMS  Lubertha Sayres, [place] [phone]) and Surgical [phone])

## 2015-02-03 DIAGNOSIS — M545 Low back pain, unspecified: Secondary | ICD-10-CM | POA: Insufficient documentation

## 2015-02-03 DIAGNOSIS — G8929 Other chronic pain: Secondary | ICD-10-CM | POA: Insufficient documentation

## 2015-02-10 ENCOUNTER — Other Ambulatory Visit: Payer: Self-pay | Admitting: Cardiothoracic Surgery

## 2015-02-10 DIAGNOSIS — R918 Other nonspecific abnormal finding of lung field: Secondary | ICD-10-CM

## 2015-03-16 ENCOUNTER — Ambulatory Visit
Admission: RE | Admit: 2015-03-16 | Discharge: 2015-03-16 | Disposition: A | Discharge status: Home / Self Care | Payer: Medicare Other | Source: Ambulatory Visit | Attending: Cardiothoracic Surgery | Admitting: Cardiothoracic Surgery

## 2015-03-16 ENCOUNTER — Ambulatory Visit (INDEPENDENT_AMBULATORY_CARE_PROVIDER_SITE_OTHER): Payer: Medicare Other | Admitting: Cardiothoracic Surgery

## 2015-03-16 ENCOUNTER — Encounter: Payer: Self-pay | Admitting: Cardiothoracic Surgery

## 2015-03-16 VITALS — BP 125/57 | HR 65 | Resp 20 | Ht 64.5 in | Wt 225.0 lb

## 2015-03-16 DIAGNOSIS — D381 Neoplasm of uncertain behavior of trachea, bronchus and lung: Secondary | ICD-10-CM

## 2015-03-16 DIAGNOSIS — R918 Other nonspecific abnormal finding of lung field: Secondary | ICD-10-CM
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
## 2015-06-20 ENCOUNTER — Ambulatory Visit: Payer: Medicare Other | Admitting: Cardiovascular Disease

## 2015-06-21 ENCOUNTER — Encounter: Payer: Self-pay | Admitting: Cardiovascular Disease

## 2015-06-21 ENCOUNTER — Ambulatory Visit (INDEPENDENT_AMBULATORY_CARE_PROVIDER_SITE_OTHER): Payer: Medicare Other | Admitting: Cardiovascular Disease

## 2015-06-21 VITALS — BP 140/64 | HR 80 | Ht 63.0 in | Wt 229.0 lb

## 2015-06-21 DIAGNOSIS — R002 Palpitations: Secondary | ICD-10-CM

## 2015-06-21 DIAGNOSIS — I10 Essential (primary) hypertension: Secondary | ICD-10-CM

## 2015-06-21 NOTE — Assessment & Plan Note (Signed)
History of palpitations worse since her rotator cuff surgery in October. I'm going to place a 2 week event monitor to further evaluate

## 2015-06-21 NOTE — Patient Instructions (Signed)
Medication Instructions:  Your physician recommends that you continue on your current medications as directed. Please refer to the Current Medication list given to you today.   Labwork: none  Testing/Procedures: Your physician has recommended that you wear an event monitor. Event monitors are medical devices that record the heart's electrical activity. Doctors most often [place] these monitors to diagnose arrhythmias. Arrhythmias are problems with the speed or rhythm of the heartbeat. The monitor is a small, portable device. You can wear one while you do your normal daily activities. This is usually used to diagnose what is causing palpitations/syncope (passing out). 2 WEEKS - SCHEDULE AT [facility] [address]    Follow-Up: Your physician wants you to follow-up in: 12 months with Dr. Gwenlyn Found. You will receive a reminder letter in the mail two months in advance. If you don't receive a letter, please call our office to schedule the follow-up appointment.   Any Other Special Instructions Will Be Listed Below (If Applicable).     If you need a refill on your cardiac medications before your next appointment, please call your pharmacy.

## 2015-06-21 NOTE — Progress Notes (Signed)
06/21/2015 Leslie Hendricks   1941-02-03  [id number]  Primary Physician Curly Rim, MD Primary Cardiologist: Lorretta Harp MD Leslie Hendricks   HPI:  The patient is a 75 year old, mildly overweight, widowed Caucasian female with no children who lives alone. Her husband of 59 years died on 04-10-2009. She has retired from the [employer]. I last saw her in the office 04/13/14. Her risk factors include 45 pack-year history of tobacco abuse, having quit 12 years ago; treated hypertension; diabetes; and hyperlipidemia. She does have a strong family history of heart disease. Both parents had bypass surgery. Three of her siblings have myocardial infractions. She had a negative pharmacologic stress test on July 03, 2010. She denies chest pain or shortness of breath. Her major complaints are of lack of stability on her legs, but she really denies claudication. She walks with the aid of a walker. She had arterial Dopplers performed in our office in March of 2012 revealing ABI of 0.98 on the right and 0.77 on the left. She has a very limited functional existence from bed to chair. She rarely gets out of the house. Since I saw her back over one year ago she's had no cardiovascular complaints.she was evaluated by Dr. Servando Snare because of the lung nodule that was not metabolic and therefore the decision was made to follow this conservatively. She apparently needs a knee replacement and was referred back here for cardiovascular clearance. I saw her over a year ago she now walks with a walker but still is has limited mobility. She gets occasional atypical chest pain and has complained of some palpitations since her rotator cuff surgery in October of last year.   Current Outpatient Prescriptions  Medication Sig Dispense Refill  . acetaminophen (TYLENOL) 325 MG tablet Take 2 tablets (650 mg total) by mouth every 6 (six) hours as needed for mild pain (or Fever >/= 101). 40 tablet 0  .  Ascorbic Acid (VITAMIN C-ROSE HIPS CR) 1000 MG TBCR Take 1 tablet by mouth daily.    Marland Kitchen aspirin EC 325 MG tablet Take 325 mg by mouth daily.    Marland Kitchen atorvastatin (LIPITOR) 80 MG tablet Take 80 mg by mouth at bedtime.     . bisacodyl (DULCOLAX) 10 MG suppository Place 1 suppository (10 mg total) rectally daily as needed for moderate constipation. 12 suppository 0  . Calcium Carb-Cholecalciferol (CALCIUM 600/VITAMIN D3) 600-800 MG-UNIT TABS Take 1 tablet by mouth daily.    . Cholecalciferol (VITAMIN D-3) 1000 UNITS CAPS Take 1 capsule by mouth daily.    . diazepam (VALIUM) 5 MG tablet Take 1 tablet (5 mg total) by mouth at bedtime. 30 tablet 0  . docusate sodium (COLACE) 100 MG capsule Take 200 mg by mouth at bedtime.    . ferrous sulfate 325 (65 FE) MG tablet Take 325 mg by mouth daily with breakfast.    . folic acid (FOLVITE) A999333 MCG tablet Take 400 mcg by mouth daily.    . furosemide (LASIX) 20 MG tablet Take 20 mg by mouth daily.     Marland Kitchen HYDROcodone-acetaminophen (NORCO) 10-325 MG tablet Take 1-2 tablets by mouth every 4 (four) hours as needed for moderate pain (Prefers to take at bedtime). 60 tablet 0  . losartan-hydrochlorothiazide (HYZAAR) 100-12.5 MG tablet Take 1 tablet by mouth daily.    . Magnesium 250 MG TABS Take 1 tablet by mouth daily.    . metFORMIN (GLUCOPHAGE-XR) 500 MG 24 hr tablet Take 500 mg by mouth  daily with breakfast.    . methocarbamol (ROBAXIN) 500 MG tablet Take 1 tablet (500 mg total) by mouth every 6 (six) hours as needed for muscle spasms. 50 tablet 0  . Multiple Vitamins-Minerals (VISION FORMULA/LUTEIN PO) Take 1 capsule by mouth daily.    Marland Kitchen omega-3 fish oil (MAXEPA) 1000 MG CAPS capsule Take 1 capsule by mouth daily.    Vladimir Faster Glycol-Propyl Glycol (SYSTANE OP) Place 1 drop into both eyes 2 (two) times daily.     . polyethylene glycol (MIRALAX / GLYCOLAX) packet Take 17 g by mouth daily as needed for mild constipation. 14 each 0  . Sennosides 25 MG TABS Take 50 mg by  mouth at bedtime.    Marland Kitchen venlafaxine (EFFEXOR-XR) 75 MG 24 hr capsule Take 75 mg by mouth daily with breakfast.     . vitamin E 400 UNIT capsule Take 400 Units by mouth daily.    Marland Kitchen zinc gluconate 50 MG tablet Take 50 mg by mouth daily.     No current facility-administered medications for this visit.    Allergies  Allergen Reactions  . Banana Nausea And Vomiting  . Codeine Nausea And Vomiting  . Dilaudid [Hydromorphone] Other (See Comments)    Altered mental status  . Oxycodone Hcl Nausea And Vomiting  . Sausage [Pickled Meat] Nausea And Vomiting    Social History   Social History  . Marital Status: Widowed    Spouse Name: N/A  . Number of Children: N/A  . Years of Education: N/A   Occupational History  . Not on file.   Social History Main Topics  . Smoking status: Former Smoker -- 1.00 packs/day for 30 years    Types: Cigarettes    Quit date: 04/15/1998  . Smokeless tobacco: Never Used  . Alcohol Use: No  . Drug Use: No  . Sexual Activity: No   Other Topics Concern  . Not on file   Social History Narrative     Review of Systems: General: negative for chills, fever, night sweats or weight changes.  Cardiovascular: negative for chest pain, dyspnea on exertion, edema, orthopnea, palpitations, paroxysmal nocturnal dyspnea or shortness of breath Dermatological: negative for rash Respiratory: negative for cough or wheezing Urologic: negative for hematuria Abdominal: negative for nausea, vomiting, diarrhea, bright red blood per rectum, melena, or hematemesis Neurologic: negative for visual changes, syncope, or dizziness All other systems reviewed and are otherwise negative except as noted above.    Blood pressure 140/64, pulse 80, height 5\' 3"  (1.6 m), weight 229 lb (103.874 kg).  General appearance: alert and no distress Neck: no adenopathy, no carotid bruit, no JVD, supple, symmetrical, trachea midline and thyroid not enlarged, symmetric, no  tenderness/mass/nodules Lungs: clear to auscultation bilaterally Heart: regular rate and rhythm, S1, S2 normal, no murmur, click, rub or gallop Extremities: extremities normal, atraumatic, no cyanosis or edema  EKG not performed today  ASSESSMENT AND PLAN:   HYPERLIPIDEMIA History of hyperlipidemia on statin therapy followed by her PCP  Essential hypertension History of hypertension blood pressure measured today at 140/64. She is on losartan and hydrochlorothiazide very continued current meds at current dosing  PALPITATIONS History of palpitations worse since her rotator cuff surgery in October. I'm going to place a 2 week event monitor to further evaluate      Lorretta Harp MD [facility], [facility] 06/21/2015 3:57 PM

## 2015-06-21 NOTE — Assessment & Plan Note (Signed)
History of hypertension blood pressure measured today at 140/64. She is on losartan and hydrochlorothiazide very continued current meds at current dosing

## 2015-06-21 NOTE — Assessment & Plan Note (Signed)
History of hyperlipidemia on statin therapy followed by her PCP. 

## 2015-06-26 ENCOUNTER — Ambulatory Visit (INDEPENDENT_AMBULATORY_CARE_PROVIDER_SITE_OTHER): Payer: Medicare Other

## 2015-06-26 DIAGNOSIS — I10 Essential (primary) hypertension: Secondary | ICD-10-CM

## 2015-06-26 DIAGNOSIS — R002 Palpitations: Secondary | ICD-10-CM

## 2015-07-20 ENCOUNTER — Ambulatory Visit: Payer: Medicare Other | Attending: Physician Assistant | Admitting: Physical Therapy

## 2015-07-20 DIAGNOSIS — M25562 Pain in left knee: Secondary | ICD-10-CM | POA: Diagnosis not present

## 2015-07-20 DIAGNOSIS — M25662 Stiffness of left knee, not elsewhere classified: Secondary | ICD-10-CM | POA: Diagnosis present
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
## 2015-07-24 ENCOUNTER — Ambulatory Visit: Payer: Medicare Other | Admitting: Physical Therapy

## 2015-07-24 ENCOUNTER — Encounter: Payer: Self-pay | Admitting: Physical Therapy

## 2015-07-24 DIAGNOSIS — M25662 Stiffness of left knee, not elsewhere classified: Secondary | ICD-10-CM

## 2015-07-24 DIAGNOSIS — M25562 Pain in left knee: Secondary | ICD-10-CM
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
## 2015-07-27 ENCOUNTER — Encounter: Payer: Self-pay | Admitting: Physical Therapy

## 2015-07-27 ENCOUNTER — Ambulatory Visit: Payer: Medicare Other | Admitting: Physical Therapy

## 2015-07-27 DIAGNOSIS — M25662 Stiffness of left knee, not elsewhere classified: Secondary | ICD-10-CM

## 2015-07-27 DIAGNOSIS — M25562 Pain in left knee: Secondary | ICD-10-CM
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
## 2015-07-31 ENCOUNTER — Encounter: Payer: Medicare Other | Admitting: Physical Therapy

## 2015-08-02 ENCOUNTER — Encounter: Payer: Medicare Other | Admitting: Physical Therapy

## 2015-08-04 ENCOUNTER — Ambulatory Visit: Payer: Medicare Other | Admitting: Physical Therapy

## 2015-08-04 DIAGNOSIS — M25562 Pain in left knee: Secondary | ICD-10-CM

## 2015-08-04 DIAGNOSIS — M25662 Stiffness of left knee, not elsewhere classified: Secondary | ICD-10-CM
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
## 2015-08-07 ENCOUNTER — Ambulatory Visit: Payer: Medicare Other | Admitting: Physical Therapy

## 2015-08-07 DIAGNOSIS — M25662 Stiffness of left knee, not elsewhere classified: Secondary | ICD-10-CM

## 2015-08-07 DIAGNOSIS — M25562 Pain in left knee: Secondary | ICD-10-CM | POA: Diagnosis not present

## 2015-08-07 NOTE — Patient Instructions (Addendum)
Achilles / Gastroc, Standing    Stand,with left foot behind, heel on floor and turned slightly out, leg straight. Bend front knee and lunge forward until stretch is felt. Hold _30__ seconds. Repeat __3 times per session. Do _2-3__ sessions per day.  Copyright  VHI. All rights reserved.   Achilles Tendon Stretch    Stand with hands supported on wall, elbows slightly bent, feet parallel and both heels on floor, front knee bent, back knee straight. SLOWLY BEND BACK knee until a stretch is felt in achilles tendon. Hold _30___ seconds. Repeat with leg positions switched.  Copyright  VHI. All rights reserved.   Madelyn Flavors, PT 08/07/2015 3:21 PM  [facility] [place], [place], [address] Phone: [phone]   Fax:  [phone]  Trigger Point Dry Needling  . What is Trigger Point Dry Needling (DN)? o DN is a physical therapy technique used to treat muscle pain and dysfunction. Specifically, DN helps deactivate muscle trigger points (muscle knots).  o A thin filiform needle is used to penetrate the skin and stimulate the underlying trigger point. The goal is for a local twitch response (LTR) to occur and for the trigger point to relax. No medication of any kind is injected during the procedure.   . What Does Trigger Point Dry Needling Feel Like?  o The procedure feels different for each individual patient. Some patients report that they do not actually feel the needle enter the skin and overall the process is not painful. Very mild bleeding may occur. However, many patients feel a deep cramping in the muscle in which the needle was inserted. This is the local twitch response.   Marland Kitchen How Will I feel after the treatment? o Soreness is normal, and the onset of soreness may not occur for a few hours. Typically this soreness does not last longer than two days.  o Bruising is uncommon, however; ice can be used to decrease any possible  bruising.  o In rare cases feeling tired or nauseous after the treatment is normal. In addition, your symptoms may get worse before they get better, this period will typically not last longer than 24 hours.   . What Can I do After My Treatment? o Increase your hydration by drinking more water for the next 24 hours. o You may place ice or heat on the areas treated that have become sore, however, do not use heat on inflamed or bruised areas. Heat often brings more relief post needling. o You can continue your regular activities, but vigorous activity is not recommended initially after the treatment for 24 hours. o DN is best combined with other physical therapy such as strengthening, stretching, and other therapies.   Madelyn Flavors, PT
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
## 2015-08-09 ENCOUNTER — Ambulatory Visit: Payer: Medicare Other | Admitting: Physical Therapy

## 2015-08-09 DIAGNOSIS — M25562 Pain in left knee: Secondary | ICD-10-CM

## 2015-08-09 DIAGNOSIS — M25662 Stiffness of left knee, not elsewhere classified: Secondary | ICD-10-CM
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
## 2015-08-14 ENCOUNTER — Ambulatory Visit: Payer: Medicare Other | Attending: Physician Assistant | Admitting: Physical Therapy

## 2015-08-14 ENCOUNTER — Encounter: Payer: Self-pay | Admitting: Physical Therapy

## 2015-08-14 DIAGNOSIS — M25562 Pain in left knee: Secondary | ICD-10-CM

## 2015-08-14 DIAGNOSIS — M25662 Stiffness of left knee, not elsewhere classified: Secondary | ICD-10-CM | POA: Diagnosis present
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
## 2015-08-17 ENCOUNTER — Encounter: Payer: Medicare Other | Admitting: Physical Therapy

## 2015-08-17 ENCOUNTER — Ambulatory Visit: Payer: Medicare Other | Admitting: Physical Therapy

## 2015-08-17 DIAGNOSIS — M25562 Pain in left knee: Secondary | ICD-10-CM

## 2015-08-17 DIAGNOSIS — M25662 Stiffness of left knee, not elsewhere classified: Secondary | ICD-10-CM
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
## 2015-08-17 NOTE — Patient Instructions (Signed)
Trigger Point Dry Needling  . What is Trigger Point Dry Needling (DN)? o DN is a physical therapy technique used to treat muscle pain and dysfunction. Specifically, DN helps deactivate muscle trigger points (muscle knots).  o A thin filiform needle is used to penetrate the skin and stimulate the underlying trigger point. The goal is for a local twitch response (LTR) to occur and for the trigger point to relax. No medication of any kind is injected during the procedure.   . What Does Trigger Point Dry Needling Feel Like?  o The procedure feels different for each individual patient. Some patients report that they do not actually feel the needle enter the skin and overall the process is not painful. Very mild bleeding may occur. However, many patients feel a deep cramping in the muscle in which the needle was inserted. This is the local twitch response.   Marland Kitchen How Will I feel after the treatment? o Soreness is normal, and the onset of soreness may not occur for a few hours. Typically this soreness does not last longer than two days.  o Bruising is uncommon, however; ice can be used to decrease any possible bruising.  o In rare cases feeling tired or nauseous after the treatment is normal. In addition, your symptoms may get worse before they get better, this period will typically not last longer than 24 hours.   . What Can I do After My Treatment? o Increase your hydration by drinking more water for the next 24 hours. o You may place ice or heat on the areas treated that have become sore, however, do not use heat on inflamed or bruised areas. Heat often brings more relief post needling. o You can continue your regular activities, but vigorous activity is not recommended initially after the treatment for 24 hours. o DN is best combined with other physical therapy such as strengthening, stretching, and other therapies.   Madelyn Flavors, PT

## 2015-08-22 ENCOUNTER — Ambulatory Visit: Payer: Medicare Other | Admitting: Physical Therapy

## 2015-08-22 DIAGNOSIS — M25562 Pain in left knee: Secondary | ICD-10-CM | POA: Diagnosis not present

## 2015-08-22 DIAGNOSIS — M25662 Stiffness of left knee, not elsewhere classified: Secondary | ICD-10-CM
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
## 2015-08-24 ENCOUNTER — Ambulatory Visit: Payer: Medicare Other | Admitting: Physical Therapy

## 2015-08-24 DIAGNOSIS — M25662 Stiffness of left knee, not elsewhere classified: Secondary | ICD-10-CM

## 2015-08-24 DIAGNOSIS — M25562 Pain in left knee: Secondary | ICD-10-CM | POA: Diagnosis not present
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
## 2015-08-29 ENCOUNTER — Encounter: Payer: Medicare Other | Admitting: Physical Therapy

## 2015-08-30 ENCOUNTER — Other Ambulatory Visit (HOSPITAL_COMMUNITY): Payer: Self-pay | Admitting: Orthopedic Surgery

## 2015-08-30 DIAGNOSIS — Z96652 Presence of left artificial knee joint: Secondary | ICD-10-CM

## 2015-08-30 DIAGNOSIS — Z471 Aftercare following joint replacement surgery: Secondary | ICD-10-CM

## 2015-08-31 ENCOUNTER — Encounter: Payer: Medicare Other | Admitting: Physical Therapy

## 2015-09-06 ENCOUNTER — Encounter (HOSPITAL_COMMUNITY)
Admission: RE | Admit: 2015-09-06 | Discharge: 2015-09-06 | Disposition: A | Discharge status: Home / Self Care | Payer: Medicare Other | Source: Ambulatory Visit | Attending: Orthopedic Surgery | Admitting: Orthopedic Surgery

## 2015-09-06 DIAGNOSIS — Z471 Aftercare following joint replacement surgery: Secondary | ICD-10-CM | POA: Diagnosis present

## 2015-09-06 DIAGNOSIS — R937 Abnormal findings on diagnostic imaging of other parts of musculoskeletal system: Secondary | ICD-10-CM | POA: Diagnosis not present

## 2015-09-06 DIAGNOSIS — Z96652 Presence of left artificial knee joint: Secondary | ICD-10-CM

## 2015-09-06 MED ORDER — TECHNETIUM TC 99M MEDRONATE IV KIT
25.0000 | PACK | Freq: Once | INTRAVENOUS | Status: AC | PRN
  Administered 2015-09-06: 25 via INTRAVENOUS

## 2015-09-19 ENCOUNTER — Telehealth: Payer: Self-pay | Admitting: Cardiovascular Disease

## 2015-09-19 NOTE — Telephone Encounter (Signed)
Leslie Hendricks is calling to get her heart monitor results . Please call   Thanks

## 2015-09-19 NOTE — Telephone Encounter (Signed)
Called patient with monitor results.  She still feels palpitations - no specific time when they are noted - no correlation w/rest or activity   Monitor results in EPIC as follows:  Study Highlights    1. NSR 2.Occassional PVCs   Will route to MD to see if he has further advice

## 2015-09-20 NOTE — Telephone Encounter (Signed)
Return office visit at some point to discuss PVCs

## 2015-09-20 NOTE — Telephone Encounter (Signed)
LMTCB  Routed to Dr. Kennon Holter nurse Anderson Malta, RN

## 2015-09-21 NOTE — Telephone Encounter (Signed)
Called patient and appt scheduled on 10/03/15. Patient verbalized understanding.

## 2015-10-03 ENCOUNTER — Encounter: Payer: Self-pay | Admitting: Cardiovascular Disease

## 2015-10-03 ENCOUNTER — Ambulatory Visit (INDEPENDENT_AMBULATORY_CARE_PROVIDER_SITE_OTHER): Payer: Medicare Other | Admitting: Cardiovascular Disease

## 2015-10-03 VITALS — BP 122/52 | HR 75 | Ht 64.5 in | Wt 234.2 lb

## 2015-10-03 DIAGNOSIS — R002 Palpitations: Secondary | ICD-10-CM | POA: Diagnosis not present

## 2015-10-03 DIAGNOSIS — I10 Essential (primary) hypertension: Secondary | ICD-10-CM | POA: Diagnosis not present

## 2015-10-03 NOTE — Assessment & Plan Note (Signed)
History of hypertension with blood pressure measures 122/52. She is on losartan and hydrochlorothiazide Continue current meds at current dosing

## 2015-10-03 NOTE — Assessment & Plan Note (Signed)
History of hyperlipidemia on statin therapy followed by her PCP. 

## 2015-10-03 NOTE — Patient Instructions (Signed)

## 2015-10-03 NOTE — Assessment & Plan Note (Signed)
History of palpitations with event monitoring revealing only PVCs.

## 2015-10-03 NOTE — Progress Notes (Signed)
10/03/2015 Leslie Hendricks   [birth year]  [id number]  Primary Physician Curly Rim, MD Primary Cardiologist: Lorretta Harp MD [name]  HPI:  The patient is a 75 year old, mildly overweight, widowed Caucasian female with no children who lives alone. Her husband of 77 years died on 04-10-2009. She has retired from the [employer]. I last saw her in the office 06/21/15.Marland Kitchen Her risk factors include 45 pack-year history of tobacco abuse, having quit 12 years ago; treated hypertension; diabetes; and hyperlipidemia. She does have a strong family history of heart disease. Both parents had bypass surgery. Three of her siblings have myocardial infractions. She had a negative pharmacologic stress test on July 03, 2010. She denies chest pain or shortness of breath. Her major complaints are of lack of stability on her legs, but she really denies claudication. She walks with the aid of a walker. She had arterial Dopplers performed in our office in March of 2012 revealing ABI of 0.98 on the right and 0.77 on the left. She has a very limited functional existence from bed to chair. She rarely gets out of the house. Since I saw her back over one year ago she's had no cardiovascular complaints.she was evaluated by Dr. Servando Snare because of the lung nodule that was not metabolic and therefore the decision was made to follow this conservatively. She apparently needs a knee replacement and was referred back here for cardiovascular clearance. I saw her over a year ago she now walks with a walker but still is has limited mobility. She gets occasional atypical chest pain and has complained of some palpitations since her rotator cuff surgery in October of last year. I performed a 30 day event monitor which showed only unifocal PVCs.   Current Outpatient Prescriptions  Medication Sig Dispense Refill  . acetaminophen (TYLENOL) 325 MG tablet Take 2 tablets (650 mg total) by mouth every 6 (six) hours  as needed for mild pain (or Fever >/= 101). 40 tablet 0  . Ascorbic Acid (VITAMIN C-ROSE HIPS CR) 1000 MG TBCR Take 1 tablet by mouth daily.    Marland Kitchen aspirin EC 325 MG tablet Take 325 mg by mouth daily.    Marland Kitchen atorvastatin (LIPITOR) 80 MG tablet Take 80 mg by mouth at bedtime.     . Calcium Carb-Cholecalciferol (CALCIUM 600/VITAMIN D3) 600-800 MG-UNIT TABS Take 1 tablet by mouth daily.    . Cholecalciferol (VITAMIN D-3) 1000 UNITS CAPS Take 1 capsule by mouth daily.    . diazepam (VALIUM) 5 MG tablet Take 1 tablet (5 mg total) by mouth at bedtime. 30 tablet 0  . docusate sodium (COLACE) 100 MG capsule Take 200 mg by mouth at bedtime.    . ferrous sulfate 325 (65 FE) MG tablet Take 325 mg by mouth daily with breakfast.    . folic acid (FOLVITE) A999333 MCG tablet Take 400 mcg by mouth daily.    . furosemide (LASIX) 20 MG tablet Take 20 mg by mouth daily.     Marland Kitchen HYDROcodone-acetaminophen (NORCO) 10-325 MG tablet Take 1-2 tablets by mouth every 4 (four) hours as needed for moderate pain (Prefers to take at bedtime). 60 tablet 0  . losartan-hydrochlorothiazide (HYZAAR) 100-12.5 MG tablet Take 1 tablet by mouth daily.    . Magnesium 250 MG TABS Take 1 tablet by mouth daily.    . metFORMIN (GLUCOPHAGE-XR) 500 MG 24 hr tablet Take 500 mg by mouth daily with breakfast.    . Multiple Vitamins-Minerals (VISION  FORMULA/LUTEIN PO) Take 1 capsule by mouth daily.    Marland Kitchen omega-3 fish oil (MAXEPA) 1000 MG CAPS capsule Take 1 capsule by mouth daily.    Vladimir Faster Glycol-Propyl Glycol (SYSTANE OP) Place 1 drop into both eyes 2 (two) times daily.     . polyethylene glycol (MIRALAX / GLYCOLAX) packet Take 17 g by mouth daily as needed for mild constipation. 14 each 0  . Sennosides 25 MG TABS Take 50 mg by mouth at bedtime.    Marland Kitchen venlafaxine (EFFEXOR-XR) 75 MG 24 hr capsule Take 75 mg by mouth daily with breakfast.     . vitamin E 400 UNIT capsule Take 400 Units by mouth daily.    Marland Kitchen zinc gluconate 50 MG tablet Take 50 mg by  mouth daily.     No current facility-administered medications for this visit.    Allergies  Allergen Reactions  . Banana Nausea And Vomiting  . Codeine Nausea And Vomiting  . Dilaudid [Hydromorphone] Other (See Comments)    Altered mental status  . Oxycodone Hcl Nausea And Vomiting  . Sausage [Pickled Meat] Nausea And Vomiting    Social History   Social History  . Marital Status: Widowed    Spouse Name: N/A  . Number of Children: N/A  . Years of Education: N/A   Occupational History  . Not on file.   Social History Main Topics  . Smoking status: Former Smoker -- 1.00 packs/day for 30 years    Types: Cigarettes    Quit date: 04/15/1998  . Smokeless tobacco: Never Used  . Alcohol Use: No  . Drug Use: No  . Sexual Activity: No   Other Topics Concern  . Not on file   Social History Narrative     Review of Systems: General: negative for chills, fever, night sweats or weight changes.  Cardiovascular: negative for chest pain, dyspnea on exertion, edema, orthopnea, palpitations, paroxysmal nocturnal dyspnea or shortness of breath Dermatological: negative for rash Respiratory: negative for cough or wheezing Urologic: negative for hematuria Abdominal: negative for nausea, vomiting, diarrhea, bright red blood per rectum, melena, or hematemesis Neurologic: negative for visual changes, syncope, or dizziness All other systems reviewed and are otherwise negative except as noted above.    Blood pressure 122/52, pulse 75, height 5' 4.5" (1.638 m), weight 234 lb 3.2 oz (106.232 kg).  General appearance: alert and no distress Neck: no adenopathy, no carotid bruit, no JVD, supple, symmetrical, trachea midline and thyroid not enlarged, symmetric, no tenderness/mass/nodules Lungs: clear to auscultation bilaterally Heart: regular rate and rhythm, S1, S2 normal, no murmur, click, rub or gallop Extremities: extremities normal, atraumatic, no cyanosis or edema  EKG normal sinus  rhythm at 75 with unifocal PVCs. I personally reviewed this EKG  ASSESSMENT AND PLAN:   HYPERLIPIDEMIA History of hyperlipidemia on statin therapy followed by her PCP  Essential hypertension History of hypertension with blood pressure measures 122/52. She is on losartan and hydrochlorothiazide Continue current meds at current dosing  PALPITATIONS History of palpitations with event monitoring revealing only PVCs.      Lorretta Harp MD FACP,FACC,FAHA, [facility] 10/03/2015 3:23 PM

## 2015-10-16 NOTE — Addendum Note (Signed)
Addended by: Therisa Doyne on: 10/16/2015 04:35 PM   Modules accepted: Orders

## 2015-12-23 DIAGNOSIS — E119 Type 2 diabetes mellitus without complications: Secondary | ICD-10-CM | POA: Insufficient documentation

## 2016-11-21 IMAGING — MR MR SHOULDER*R* W/O CM
4 of 6 series · 19 of 40 positions shown · non-contrast
Comparison: Plain films of the right shoulder 12/28/2013.

CLINICAL DATA: Severe right shoulder pain since a fall 2-3 months
ago. Subsequent encounter.

EXAM:
MRI OF THE RIGHT SHOULDER WITHOUT CONTRAST
TECHNIQUE: Multiplanar, multisequence MR imaging of the shoulder was performed.
No intravenous contrast was administered.

[Series 5: T2 fat-sat · axial · 4.0mm · 0.27mm/px · z∈[-12,+52]mm · 6 of 16 slices shown (1 of 2)]
[im 1/16]
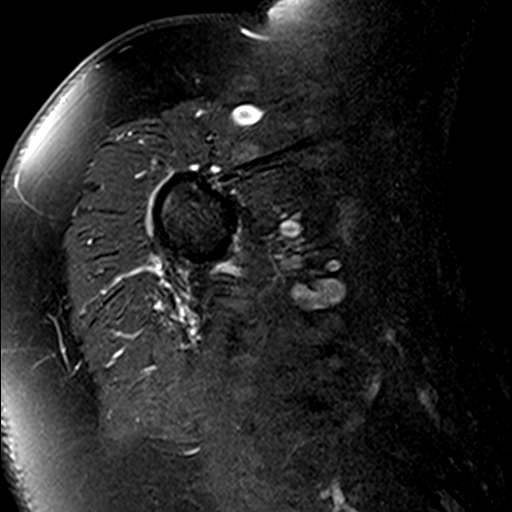
[im 4/16]
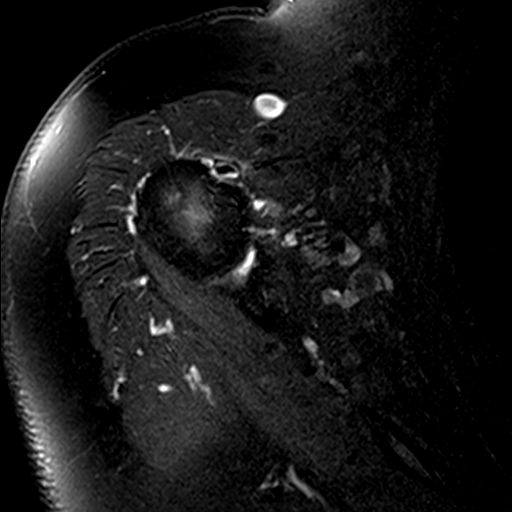
[im 7/16]
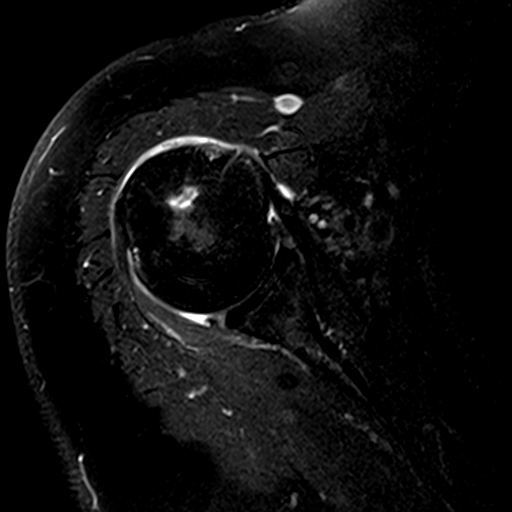
[im 10/16]
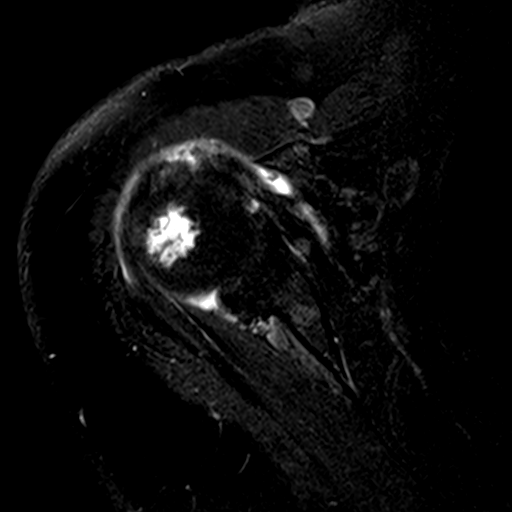
[im 13/16]
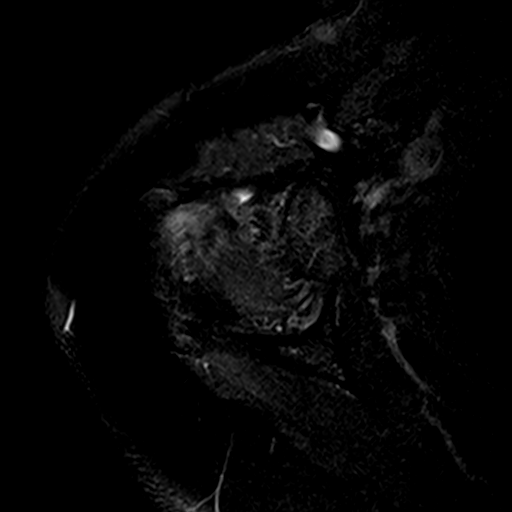
[im 16/16]
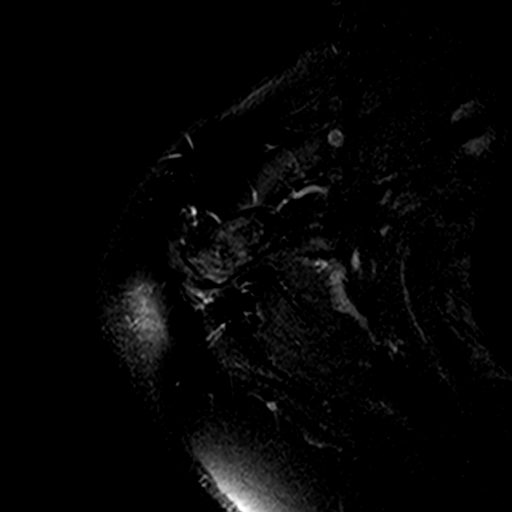

[Series 7: t2_tse_cor · oblique · 4.0mm · 0.31mm/px · 3 of 16 slices shown]
[im 4/16]
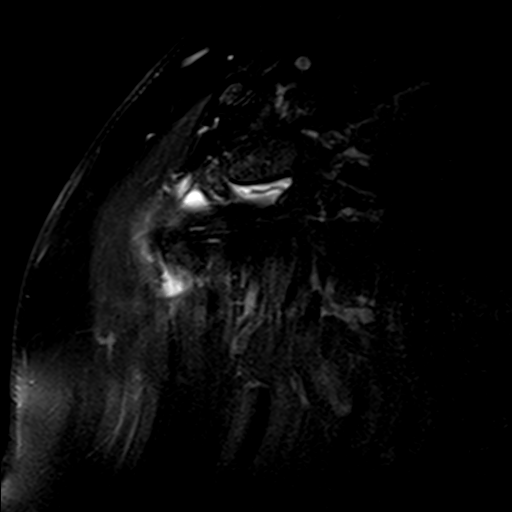
[im 10/16]
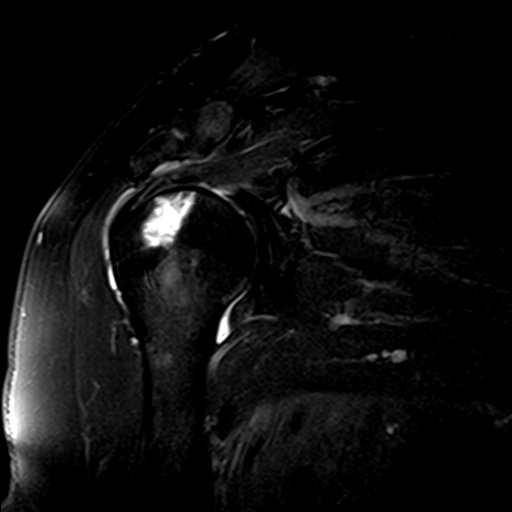
[im 16/16]
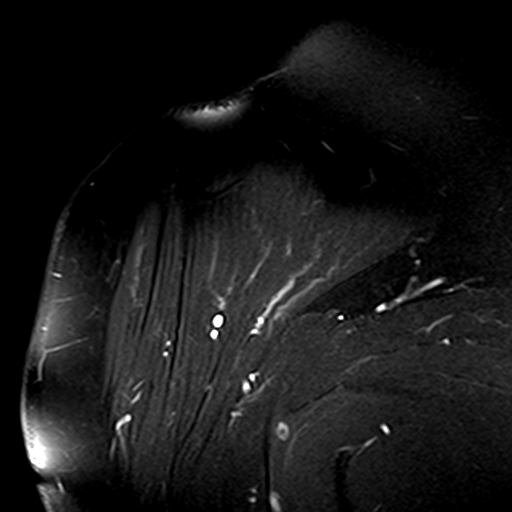

[Series 10: T2 fat-sat · oblique · 4.0mm · 0.31mm/px · 7 of 16 slices shown (2 of 2)]
[im 1/16]
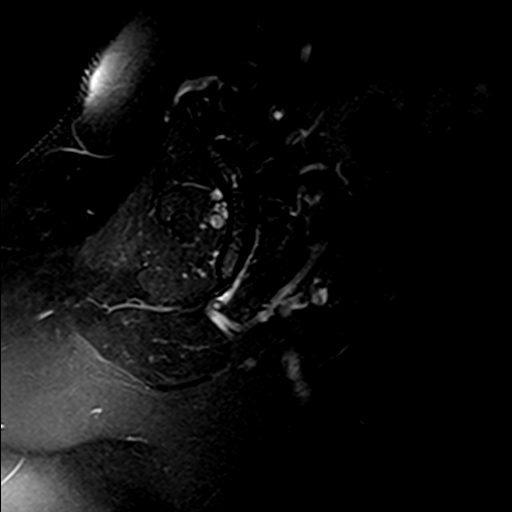
[im 3/16]
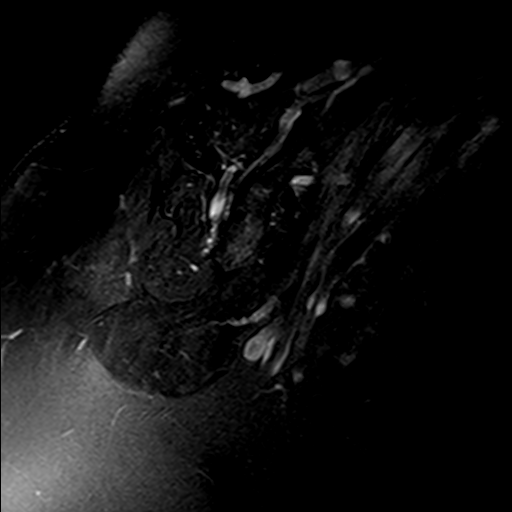
[im 6/16]
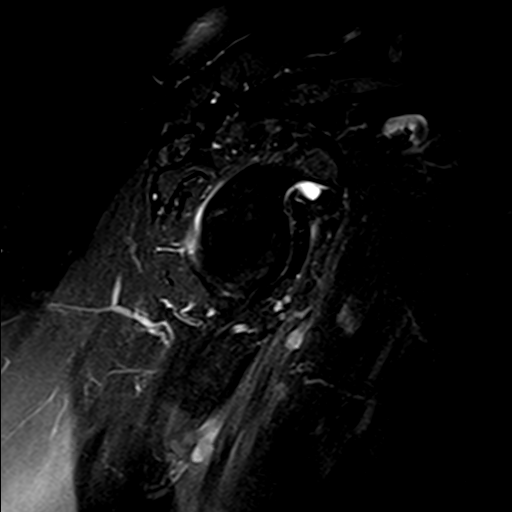
[im 8/16]
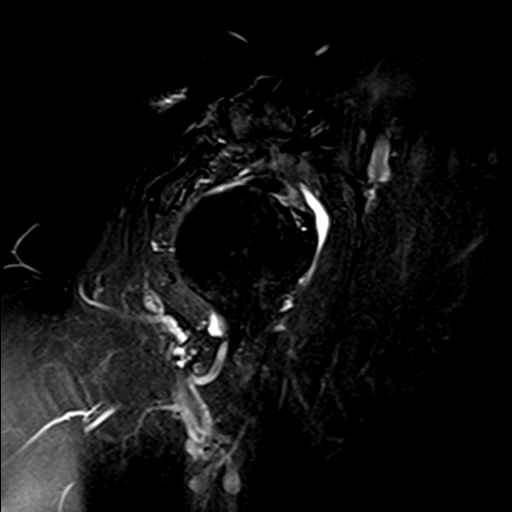
[im 11/16]
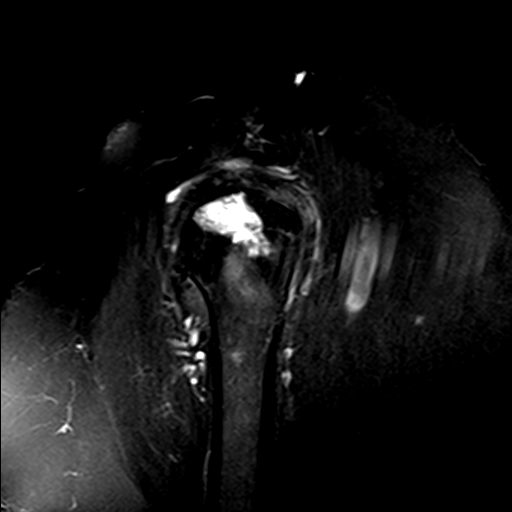
[im 13/16]
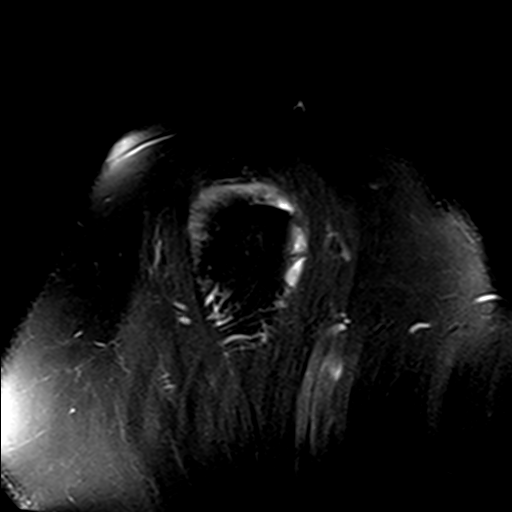
[im 16/16]
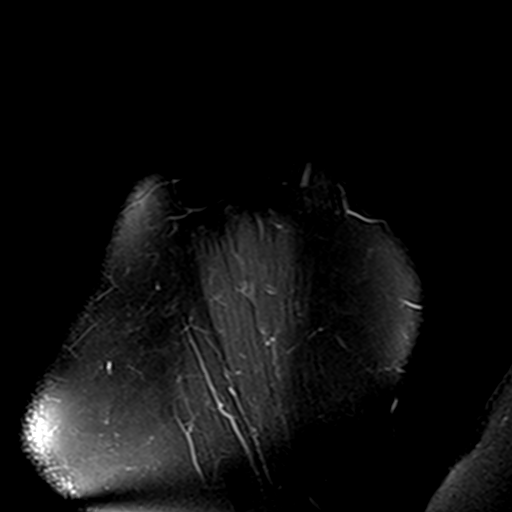

[Series 11: T1 · oblique · 4.0mm · 0.31mm/px · 3 of 16 slices shown]
[im 3/16]
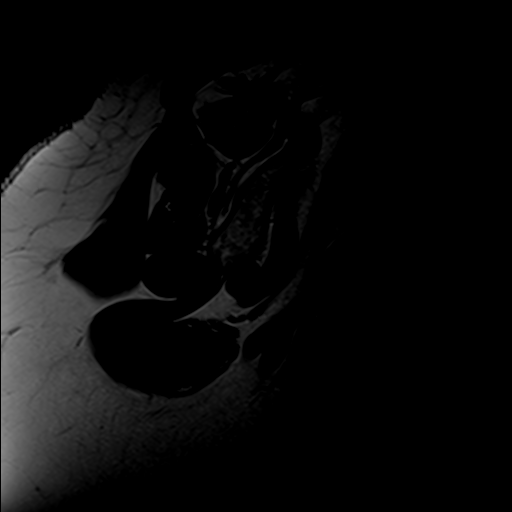
[im 8/16]
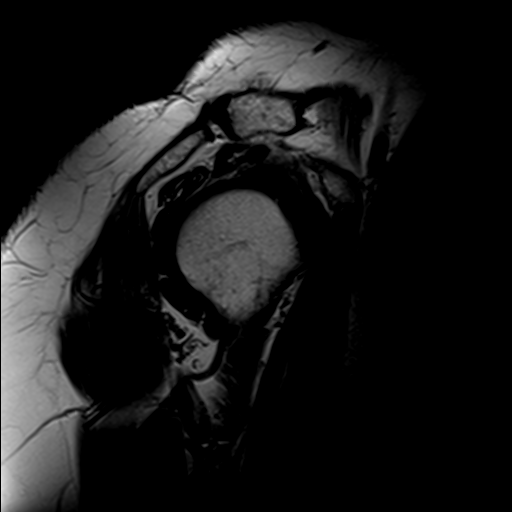
[im 13/16]
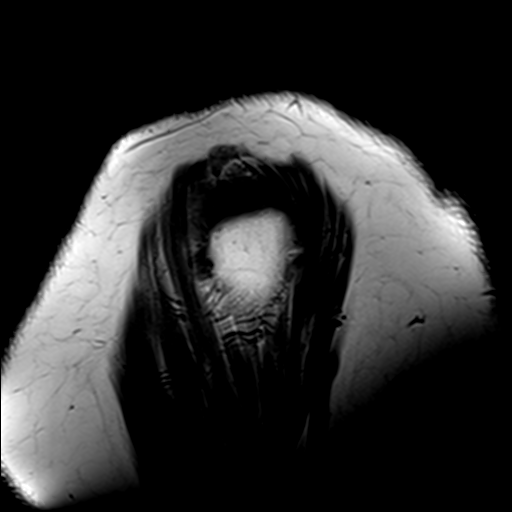

[19 of 40 positions shown; findings below may reference images not displayed]

FINDINGS: Rotator cuff: The patient has supraspinatus and infraspinatus
tendinopathy. There is very small partial width, full-thickness tear
of the anterior and far lateral supraspinatus measuring 0.4 cm from
front to back. There is no retraction. The infraspinatus and
subscapularis are intact.

Muscles:  Normal in appearance without atrophy or focal lesion.

Biceps long head:  Intact.

Acromioclavicular Joint: Moderate to moderately severe degenerative
disease is identified.

Glenohumeral Joint: Unremarkable.

Labrum:  Intact.

Bones: There is abnormal signal in the superior margin of the
humeral head. A subchondral T1 hypointense line is identified
suggesting osteonecrosis although there appears to be cystic change
subjacent to this which may be due to an intraosseous geode. The
acromion is type 1 and downsloping with subacromial spurring. Fluid
is present in the subacromial/subdeltoid bursa.
IMPRESSION: Rotator cuff tendinopathy with a very small partial width,
full-thickness tear of the anterior and far lateral supraspinatus
without retraction or atrophy.

Acromioclavicular osteoarthritis and subacromial/subdeltoid
bursitis. Downsloping acromion with subacromial spurring also noted.

Abnormal appearance of the humeral head as an appearance most
suggestive of osteonecrosis.

## 2016-11-26 DIAGNOSIS — M722 Plantar fascial fibromatosis: Secondary | ICD-10-CM | POA: Insufficient documentation

## 2016-11-26 DIAGNOSIS — M25552 Pain in left hip: Secondary | ICD-10-CM | POA: Insufficient documentation

## 2016-12-31 ENCOUNTER — Encounter: Payer: Self-pay | Admitting: Podiatry

## 2016-12-31 ENCOUNTER — Ambulatory Visit (INDEPENDENT_AMBULATORY_CARE_PROVIDER_SITE_OTHER): Payer: Medicare Other

## 2016-12-31 ENCOUNTER — Ambulatory Visit (INDEPENDENT_AMBULATORY_CARE_PROVIDER_SITE_OTHER): Payer: Medicare Other | Admitting: Podiatry

## 2016-12-31 DIAGNOSIS — M722 Plantar fascial fibromatosis: Secondary | ICD-10-CM | POA: Diagnosis not present

## 2016-12-31 DIAGNOSIS — Q828 Other specified congenital malformations of skin: Secondary | ICD-10-CM

## 2016-12-31 MED ORDER — METHYLPREDNISOLONE 4 MG PO TBPK: ORAL_TABLET | ORAL | 0 refills | Status: DC

## 2016-12-31 MED ORDER — MELOXICAM 15 MG PO TABS: 15.0000 mg | ORAL_TABLET | Freq: Every day | ORAL | 3 refills | Status: DC

## 2016-12-31 NOTE — Patient Instructions (Signed)
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
## 2017-01-28 ENCOUNTER — Ambulatory Visit: Payer: Medicare Other | Admitting: Podiatry

## 2017-07-01 ENCOUNTER — Encounter: Payer: Self-pay | Admitting: Podiatry

## 2017-07-01 ENCOUNTER — Ambulatory Visit (INDEPENDENT_AMBULATORY_CARE_PROVIDER_SITE_OTHER): Payer: Medicare Other | Admitting: Podiatry

## 2017-07-01 DIAGNOSIS — M722 Plantar fascial fibromatosis: Secondary | ICD-10-CM | POA: Diagnosis not present

## 2017-07-01 NOTE — Progress Notes (Signed)
She presents today for follow-up of the left heel..  Objective: Vital signs are stable alert and oriented x3.  Pulses are palpable.  Pain on palpation in the tubercle of the left heel..  Assessment: Resolving plantar fasciitis.  Plan: Reinjected today with 20 mg Kenalog 5 mg Marcaine medial aspect of the left heel medial surface skin prep.  Tolerated procedure well without cases follow-up with her in the near future.

## 2018-01-16 DIAGNOSIS — N289 Disorder of kidney and ureter, unspecified: Secondary | ICD-10-CM | POA: Insufficient documentation

## 2018-01-16 DIAGNOSIS — R609 Edema, unspecified: Secondary | ICD-10-CM | POA: Insufficient documentation

## 2018-01-16 DIAGNOSIS — F5104 Psychophysiologic insomnia: Secondary | ICD-10-CM | POA: Insufficient documentation

## 2018-02-04 ENCOUNTER — Encounter (INDEPENDENT_AMBULATORY_CARE_PROVIDER_SITE_OTHER): Payer: Self-pay | Admitting: Orthopedic Surgery

## 2018-02-04 ENCOUNTER — Ambulatory Visit (INDEPENDENT_AMBULATORY_CARE_PROVIDER_SITE_OTHER): Payer: Medicare Other | Admitting: Orthopedic Surgery

## 2018-02-04 ENCOUNTER — Ambulatory Visit (INDEPENDENT_AMBULATORY_CARE_PROVIDER_SITE_OTHER): Payer: Medicare Other

## 2018-02-04 DIAGNOSIS — Z96652 Presence of left artificial knee joint: Secondary | ICD-10-CM
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
## 2018-02-05 LAB — C-REACTIVE PROTEIN: CRP: 0.3 mg/L (ref <8.0)

## 2018-02-05 LAB — CBC WITH DIFFERENTIAL/PLATELET
Basophils Absolute: 52 cells/uL (ref 0–200)
Basophils Relative: 0.8 %
Eosinophils Absolute: 260 cells/uL (ref 15–500)
Eosinophils Relative: 4 %
HCT: 37.4 % (ref 35.0–45.0)
Hemoglobin: 12.7 g/dL (ref 11.7–15.5)
Lymphs Abs: 2555 cells/uL (ref 850–3900)
MCH: 32.4 pg (ref 27.0–33.0)
MCHC: 34 g/dL (ref 32.0–36.0)
MCV: 95.4 fL (ref 80.0–100.0)
MPV: 11.7 fL (ref 7.5–12.5)
Monocytes Relative: 11.6 %
Neutro Abs: 2880 cells/uL (ref 1500–7800)
Neutrophils Relative %: 44.3 %
Platelets: 210 10*3/uL (ref 140–400)
RBC: 3.92 10*6/uL (ref 3.80–5.10)
RDW: 11.9 % (ref 11.0–15.0)
Total Lymphocyte: 39.3 %
WBC mixed population: 754 cells/uL (ref 200–950)
WBC: 6.5 10*3/uL (ref 3.8–10.8)

## 2018-02-05 LAB — SEDIMENTATION RATE: Sed Rate: 11 mm/h (ref 0–30)

## 2018-02-12 ENCOUNTER — Encounter (HOSPITAL_COMMUNITY): Payer: Medicare Other

## 2018-02-17 ENCOUNTER — Encounter (HOSPITAL_COMMUNITY): Payer: Self-pay

## 2018-02-17 ENCOUNTER — Encounter (HOSPITAL_COMMUNITY)
Admission: RE | Admit: 2018-02-17 | Discharge: 2018-02-17 | Disposition: A | Discharge status: Home / Self Care | Payer: Medicare Other | Source: Ambulatory Visit | Attending: Orthopedic Surgery | Admitting: Orthopedic Surgery

## 2018-02-17 ENCOUNTER — Ambulatory Visit (HOSPITAL_COMMUNITY)
Admission: RE | Admit: 2018-02-17 | Discharge: 2018-02-17 | Disposition: A | Discharge status: Home / Self Care | Payer: Medicare Other | Source: Ambulatory Visit | Attending: Orthopedic Surgery | Admitting: Orthopedic Surgery

## 2018-02-17 DIAGNOSIS — Z96652 Presence of left artificial knee joint: Secondary | ICD-10-CM | POA: Diagnosis not present

## 2018-02-17 MED ORDER — TECHNETIUM TC 99M MEDRONATE IV KIT
20.0000 | PACK | Freq: Once | INTRAVENOUS | Status: AC | PRN
  Administered 2018-02-17: 20 via INTRAVENOUS

## 2018-02-18 ENCOUNTER — Encounter (INDEPENDENT_AMBULATORY_CARE_PROVIDER_SITE_OTHER): Payer: Self-pay | Admitting: Orthopedic Surgery

## 2018-02-18 ENCOUNTER — Ambulatory Visit (INDEPENDENT_AMBULATORY_CARE_PROVIDER_SITE_OTHER): Payer: Medicare Other | Admitting: Orthopedic Surgery

## 2018-02-18 DIAGNOSIS — Z96652 Presence of left artificial knee joint: Secondary | ICD-10-CM
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
## 2018-05-19 DIAGNOSIS — M25562 Pain in left knee: Secondary | ICD-10-CM

## 2018-05-19 DIAGNOSIS — G8929 Other chronic pain: Secondary | ICD-10-CM | POA: Insufficient documentation

## 2018-06-04 ENCOUNTER — Ambulatory Visit: Payer: Medicare Other | Admitting: Podiatry

## 2018-06-12 ENCOUNTER — Ambulatory Visit (INDEPENDENT_AMBULATORY_CARE_PROVIDER_SITE_OTHER): Payer: Medicare Other | Admitting: Cardiovascular Disease

## 2018-06-12 ENCOUNTER — Encounter: Payer: Self-pay | Admitting: Cardiovascular Disease

## 2018-06-12 DIAGNOSIS — R002 Palpitations: Secondary | ICD-10-CM | POA: Diagnosis not present

## 2018-06-12 DIAGNOSIS — R600 Localized edema: Secondary | ICD-10-CM

## 2018-06-12 DIAGNOSIS — I493 Ventricular premature depolarization: Secondary | ICD-10-CM | POA: Diagnosis not present

## 2018-06-12 DIAGNOSIS — I10 Essential (primary) hypertension: Secondary | ICD-10-CM

## 2018-06-12 MED ORDER — METOPROLOL SUCCINATE ER 25 MG PO TB24: 12.5000 mg | ORAL_TABLET | Freq: Every day | ORAL | 3 refills | Status: DC

## 2018-06-12 NOTE — Progress Notes (Signed)
06/12/2018 Leslie Hendricks   [birth year]  [id number]  Primary Physician Corrington, Delsa Grana, MD Primary Cardiologist: Lorretta Harp MD Garret Reddish, [place], [place]  HPI:  Leslie Hendricks is a 78 y.o.  mildly overweight, widowed Caucasian female with no children who lives alone. Her husband of 57 years died on 09-May-2009. She has retired from the [employer]. I last saw her in the office 10/03/2015 she is accompanied by her Sister Leslie Hendricks today.. Her risk factors include 45 pack-year history of tobacco abuse, having quit 12 years ago; treated hypertension; diabetes; and hyperlipidemia. She does have a strong family history of heart disease. Both parents had bypass surgery. Three of her siblings have myocardial infractions. She had a negative pharmacologic stress test on July 03, 2010. She denies chest pain or shortness of breath. Her major complaints are of lack of stability on her legs, but she really denies claudication. She walks with the aid of a walker. She had arterial Dopplers performed in our office in March of 2012 revealing ABI of 0.98 on the right and 0.77 on the left. She has a very limited functional existence from bed to chair. She rarely gets out of the house. Since I saw her back over one year ago she's had no cardiovascular complaints.she was evaluated by Dr. Servando Snare because of the lung nodule that was not metabolic and therefore the decision was made to follow this conservatively. She apparently needs a knee replacement and was referred back here for cardiovascular clearance. I saw her over a year ago she now walks with a walker but still is has limited mobility. She gets occasional atypical chest pain and has complained of some palpitations since her rotator cuff surgery in October of last year. I performed a 30 day event monitor which showed only unifocal PVCs.  Since I saw her 3 years ago she is remained relatively stable.  She does have PVCs on twelve-lead EKG today and has  had them on event monitoring the past.  She was experiencing dependent edema and was started on a diuretic by her PCP.  She denies chest pain or shortness of breath.  She still walks with a walker.  Current Meds  Medication Sig  . Ascorbic Acid (VITAMIN C-ROSE HIPS CR) 1000 MG TBCR Take 1 tablet by mouth daily.  Marland Kitchen aspirin EC 325 MG tablet Take 325 mg by mouth daily.  Marland Kitchen atorvastatin (LIPITOR) 80 MG tablet Take 80 mg by mouth at bedtime.   . Calcium Carb-Cholecalciferol (CALCIUM 600/VITAMIN D3) 600-800 MG-UNIT TABS Take 1 tablet by mouth daily.  . Cholecalciferol (VITAMIN D-3) 1000 UNITS CAPS Take 1 capsule by mouth daily.  . folic acid (FOLVITE) 010 MCG tablet Take 400 mcg by mouth daily.  . furosemide (LASIX) 20 MG tablet Take 20 mg by mouth daily.   Marland Kitchen HYDROcodone-acetaminophen (NORCO) 10-325 MG tablet Take 1-2 tablets by mouth every 4 (four) hours as needed for moderate pain (Prefers to take at bedtime).  Marland Kitchen losartan (COZAAR) 25 MG tablet Take 1 tablet by mouth daily.  . Magnesium 250 MG TABS Take 1 tablet by mouth daily.  . Multiple Vitamins-Minerals (VISION FORMULA/LUTEIN PO) Take 1 capsule by mouth daily.  Marland Kitchen omega-3 fish oil (MAXEPA) 1000 MG CAPS capsule Take 1 capsule by mouth daily.  Vladimir Faster Glycol-Propyl Glycol (SYSTANE OP) Place 1 drop into both eyes 2 (two) times daily.   . traZODone (DESYREL) 100 MG tablet Take 100 mg by mouth at bedtime.  Marland Kitchen  venlafaxine (EFFEXOR-XR) 75 MG 24 hr capsule Take 75 mg by mouth daily with breakfast.   . vitamin E 400 UNIT capsule Take 400 Units by mouth daily.  Marland Kitchen zinc gluconate 50 MG tablet Take 50 mg by mouth daily.     Allergies  Allergen Reactions  . Banana Nausea And Vomiting  . Codeine Nausea And Vomiting  . Dilaudid [Hydromorphone] Other (See Comments)    Altered mental status  . Oxycodone Hcl Nausea And Vomiting  . Sausage [Pickled Meat] Nausea And Vomiting    Social History   Socioeconomic History  . Marital status: Widowed     Spouse name: Not on file  . Number of children: Not on file  . Years of education: Not on file  . Highest education level: Not on file  Occupational History  . Not on file  Social Needs  . Financial resource strain: Not on file  . Food insecurity:    Worry: Not on file    Inability: Not on file  . Transportation needs:    Medical: Not on file    Non-medical: Not on file  Tobacco Use  . Smoking status: Former Smoker    Packs/day: 1.00    Years: 30.00    Pack years: 30.00    Types: Cigarettes    Last attempt to quit: 04/15/1998    Years since quitting: 20.1  . Smokeless tobacco: Never Used  Substance and Sexual Activity  . Alcohol use: No  . Drug use: No  . Sexual activity: Never    Birth control/protection: Surgical  Lifestyle  . Physical activity:    Days per week: Not on file    Minutes per session: Not on file  . Stress: Not on file  Relationships  . Social connections:    Talks on phone: Not on file    Gets together: Not on file    Attends religious service: Not on file    Active member of club or organization: Not on file    Attends meetings of clubs or organizations: Not on file    Relationship status: Not on file  . Intimate partner violence:    Fear of current or ex partner: Not on file    Emotionally abused: Not on file    Physically abused: Not on file    Forced sexual activity: Not on file  Other Topics Concern  . Not on file  Social History Narrative  . Not on file     Review of Systems: General: negative for chills, fever, night sweats or weight changes.  Cardiovascular: negative for chest pain, dyspnea on exertion, edema, orthopnea, palpitations, paroxysmal nocturnal dyspnea or shortness of breath Dermatological: negative for rash Respiratory: negative for cough or wheezing Urologic: negative for hematuria Abdominal: negative for nausea, vomiting, diarrhea, bright red blood per rectum, melena, or hematemesis Neurologic: negative for visual  changes, syncope, or dizziness All other systems reviewed and are otherwise negative except as noted above.    Blood pressure 132/60, pulse 72, height 5\' 4"  (1.626 m), weight 237 lb 3.2 oz (107.6 kg).  General appearance: alert and no distress Neck: no adenopathy, no carotid bruit, no JVD, supple, symmetrical, trachea midline and thyroid not enlarged, symmetric, no tenderness/mass/nodules Lungs: clear to auscultation bilaterally Heart: regular rate and rhythm, S1, S2 normal, no murmur, click, rub or gallop Extremities: extremities normal, atraumatic, no cyanosis or edema Pulses: 2+ and symmetric Skin: Skin color, texture, turgor normal. No rashes or lesions Neurologic: Alert and oriented X  3, normal strength and tone. Normal symmetric reflexes. Normal coordination and gait  EKG sinus rhythm at 72 with ventricular trigeminy.  I personally reviewed this EKG.  ASSESSMENT AND PLAN:   HYPERLIPIDEMIA History of hyperlipidemia on statin therapy followed by her PCP  Essential hypertension History of essential hypertension blood pressure measured today 132/60.  She is on losartan.  Continue current meds at current dosing.  PALPITATIONS Recent complaints of palpitations with an EKG that shows frequent PVCs.  I am going to start her on a low-dose beta-blocker empirically.  PVC's (premature ventricular contractions) PVCs on twelve-lead EKG with symptoms of palpitations.  We will start a low-dose beta-blocker.  Bilateral lower extremity edema Recently started on Lasix 3 times daily by her PCP.  This is dependent and usually worse at the end of the day.      Lorretta Harp MD FACP,FACC,FAHA, [facility] 06/12/2018 10:18 AM

## 2018-06-12 NOTE — Assessment & Plan Note (Signed)
Recent complaints of palpitations with an EKG that shows frequent PVCs.  I am going to start her on a low-dose beta-blocker empirically.

## 2018-06-12 NOTE — Assessment & Plan Note (Signed)
PVCs on twelve-lead EKG with symptoms of palpitations.  We will start a low-dose beta-blocker.

## 2018-06-12 NOTE — Assessment & Plan Note (Signed)
History of essential hypertension blood pressure measured today 132/60.  She is on losartan.  Continue current meds at current dosing.

## 2018-06-12 NOTE — Patient Instructions (Addendum)
Medication Instructions:  Your physician has recommended you make the following change in your medication:   START METOPROLOL SUICCINATE (TOPROL-XL) 12.5 MG DAILY  If you need a refill on your cardiac medications before your next appointment, please call your pharmacy.   Lab work: NONE. Please have Dr. Lorrin Jackson office fax your recent lab work to our office. Fax: [phone] If you have labs (blood work) drawn today and your tests are completely normal, you will receive your results only by: Marland Kitchen MyChart Message (if you have MyChart) OR . A paper copy in the mail If you have any lab test that is abnormal or we need to change your treatment, we will call you to review the results.  Testing/Procedures: NONE  Follow-Up: At [facility], you and your health needs are our priority.  As part of our continuing mission to provide you with exceptional heart care, we have created designated Provider Care Teams.  These Care Teams include your primary Cardiologist (physician) and Advanced Practice Providers (APPs -  Physician Assistants and Nurse Practitioners) who all work together to provide you with the care you need, when you need it. . You will need a follow up appointment in 3 months with an APP and in 6 months with Dr. Gwenlyn Found.  Please call our office 2 months in advance to schedule this appointment.  You may see Dr. Gwenlyn Found or one of the following Advanced Practice Providers on your designated Care Team:   . Kerin Ransom, [place] . Almyra Deforest, PA-C . Fabian Sharp, PA-C . Jory Sims, DNP . Rosaria Ferries, PA-C . Roby Lofts, PA-C . Sande Rives, PA-C

## 2018-06-12 NOTE — Assessment & Plan Note (Signed)
Recently started on Lasix 3 times daily by her PCP.  This is dependent and usually worse at the end of the day.

## 2018-06-12 NOTE — Assessment & Plan Note (Signed)
History of hyperlipidemia on statin therapy followed by her PCP. 

## 2018-06-17 DIAGNOSIS — N183 Chronic kidney disease, stage 3 unspecified: Secondary | ICD-10-CM | POA: Insufficient documentation

## 2018-06-18 ENCOUNTER — Ambulatory Visit (INDEPENDENT_AMBULATORY_CARE_PROVIDER_SITE_OTHER): Payer: Medicare Other | Admitting: Podiatry

## 2018-06-18 ENCOUNTER — Encounter: Payer: Self-pay | Admitting: Podiatry

## 2018-06-18 DIAGNOSIS — E119 Type 2 diabetes mellitus without complications: Secondary | ICD-10-CM

## 2018-06-18 DIAGNOSIS — M722 Plantar fascial fibromatosis: Secondary | ICD-10-CM

## 2018-06-18 DIAGNOSIS — Q828 Other specified congenital malformations of skin: Secondary | ICD-10-CM | POA: Diagnosis not present

## 2018-06-18 NOTE — Progress Notes (Signed)
She presents today for follow-up of pain in her left heel states that is hurting again and she also has painful corns and calluses that she would like to have trimmed.  Objective: Vital signs are stable alert and oriented x3.  Pulses are palpable.  Pain on palpation medial calcaneal tubercle of the left heel.  Multiple reactive hyperkeratotic tissues without open lesions plantar aspect of bilateral foot.  Assessment: Pain in limb secondary to plantar fasciitis left and hyperkeratosis porokeratotic lesions.  Plan: Discussed etiology pathology and surgical therapies debrided all reactive hyperkeratotic tissue after sterile Betadine skin prep I injected 20 mg Kenalog 5 mg Marcaine plain maximal tenderness medial aspect of the left heel.  Follow-up with her on an as-needed basis.

## 2018-06-22 ENCOUNTER — Other Ambulatory Visit: Payer: Self-pay | Admitting: Family Medicine

## 2018-06-22 DIAGNOSIS — M5090 Cervical disc disorder, unspecified, unspecified cervical region: Secondary | ICD-10-CM

## 2018-06-24 ENCOUNTER — Other Ambulatory Visit: Payer: Self-pay

## 2018-06-24 ENCOUNTER — Ambulatory Visit
Admission: RE | Admit: 2018-06-24 | Discharge: 2018-06-24 | Disposition: A | Discharge status: Home / Self Care | Payer: Medicare Other | Source: Ambulatory Visit | Attending: Family Medicine | Admitting: Family Medicine

## 2018-06-24 DIAGNOSIS — M5090 Cervical disc disorder, unspecified, unspecified cervical region: Secondary | ICD-10-CM

## 2018-07-28 DIAGNOSIS — T819XXA Unspecified complication of procedure, initial encounter: Secondary | ICD-10-CM | POA: Insufficient documentation

## 2018-08-19 ENCOUNTER — Ambulatory Visit (INDEPENDENT_AMBULATORY_CARE_PROVIDER_SITE_OTHER): Payer: Medicare Other | Admitting: Orthopedic Surgery

## 2018-08-19 ENCOUNTER — Ambulatory Visit: Payer: Self-pay | Admitting: Orthopedic Surgery

## 2018-09-03 ENCOUNTER — Encounter: Payer: Self-pay | Admitting: Orthopedic Surgery

## 2018-09-03 ENCOUNTER — Ambulatory Visit (INDEPENDENT_AMBULATORY_CARE_PROVIDER_SITE_OTHER): Payer: Medicare Other | Admitting: Orthopedic Surgery

## 2018-09-03 ENCOUNTER — Ambulatory Visit (INDEPENDENT_AMBULATORY_CARE_PROVIDER_SITE_OTHER): Payer: Medicare Other

## 2018-09-03 ENCOUNTER — Other Ambulatory Visit: Payer: Self-pay

## 2018-09-03 DIAGNOSIS — Z96652 Presence of left artificial knee joint: Secondary | ICD-10-CM | POA: Diagnosis not present

## 2018-09-03 DIAGNOSIS — M25511 Pain in right shoulder: Secondary | ICD-10-CM | POA: Diagnosis not present
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
## 2018-09-04 ENCOUNTER — Telehealth: Payer: Self-pay | Admitting: Cardiology

## 2018-09-04 NOTE — Telephone Encounter (Signed)
Phone visit only, pre-reg complete, mychart active 09/04/2018 [place]

## 2018-09-08 ENCOUNTER — Telehealth: Payer: Self-pay

## 2018-09-08 ENCOUNTER — Telehealth (INDEPENDENT_AMBULATORY_CARE_PROVIDER_SITE_OTHER): Payer: Medicare Other | Admitting: General Practice

## 2018-09-08 VITALS — BP 135/56 | HR 58 | Ht 65.0 in | Wt 229.0 lb

## 2018-09-08 DIAGNOSIS — R600 Localized edema: Secondary | ICD-10-CM

## 2018-09-08 DIAGNOSIS — R002 Palpitations: Secondary | ICD-10-CM | POA: Diagnosis not present

## 2018-09-08 DIAGNOSIS — I10 Essential (primary) hypertension: Secondary | ICD-10-CM | POA: Diagnosis not present

## 2018-09-08 MED ORDER — METOPROLOL SUCCINATE ER 25 MG PO TB24: 12.5000 mg | ORAL_TABLET | Freq: Every day | ORAL | 1 refills | Status: DC

## 2018-09-08 NOTE — Patient Instructions (Signed)
Medication Instructions:  YOU MAY TAKE A EXTRA HALF TABLET OF LOPRESSOR FOR PALPITATIONS  If you need a refill on your cardiac medications before your next appointment, please call your pharmacy.   Lab work: None  If you have labs (blood work) drawn today and your tests are completely normal, you will receive your results only by: Marland Kitchen MyChart Message (if you have MyChart) OR . A paper copy in the mail If you have any lab test that is abnormal or we need to change your treatment, we will call you to review the results.  Testing/Procedures: None   Follow-Up: At [facility], you and your health needs are our priority.  As part of our continuing mission to provide you with exceptional heart care, we have created designated Provider Care Teams.  These Care Teams include your primary Cardiologist (physician) and Advanced Practice Providers (APPs -  Physician Assistants and Nurse Practitioners) who all work together to provide you with the care you need, when you need it. You will need a follow up appointment in 3-4 months.  Please call our office 2 months in advance to schedule this appointment.  You may see Quay Burow, MD or one of the following Advanced Practice Providers on your designated Care Team:   Kerin Ransom, PA-C Roby Lofts, [place] . Sande Rives, PA-C  Any Other Special Instructions Will Be Listed Below (If Applicable).

## 2018-09-08 NOTE — Telephone Encounter (Signed)
Called patient to discuss AVS instructions. Patient agreeable with Jesse's recommendations and voiced understanding.
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
## 2018-09-10 ENCOUNTER — Ambulatory Visit: Payer: Medicare Other | Admitting: Cardiology

## 2018-09-12 ENCOUNTER — Telehealth: Payer: Self-pay

## 2018-09-12 ENCOUNTER — Other Ambulatory Visit: Payer: Medicare Other

## 2018-09-12 DIAGNOSIS — Z20822 Contact with and (suspected) exposure to covid-19: Secondary | ICD-10-CM

## 2018-09-12 NOTE — Telephone Encounter (Signed)
Called and spoke with Leslie Hendricks verified speaking to patient by DOB. Client has had a recent fall and her face is "black and blue" and is not sure. She states her sister was with her during her recent visit to [facility]. She prefers to talk to her sister and call back later with her decision. Patient was given available testing sites of [facility] this weekend or in [place] building this week. [facility] number given [phone] for call back to schedule or ask any questions.

## 2018-09-12 NOTE — Addendum Note (Signed)
Addended by: Carlisle Beers on: 09/12/2018 10:36 AM   Modules accepted: Orders

## 2018-09-14 ENCOUNTER — Ambulatory Visit: Payer: Medicare Other | Admitting: Physical Therapy

## 2018-09-14 LAB — NOVEL CORONAVIRUS, NAA: SARS-CoV-2, NAA: NOT DETECTED

## 2018-09-21 ENCOUNTER — Ambulatory Visit: Payer: Medicare Other | Attending: Orthopedic Surgery | Admitting: Physical Therapy

## 2018-09-21 ENCOUNTER — Other Ambulatory Visit: Payer: Self-pay

## 2018-09-21 ENCOUNTER — Encounter: Payer: Self-pay | Admitting: Physical Therapy

## 2018-09-21 DIAGNOSIS — M25611 Stiffness of right shoulder, not elsewhere classified: Secondary | ICD-10-CM | POA: Diagnosis present

## 2018-09-21 DIAGNOSIS — M25511 Pain in right shoulder: Secondary | ICD-10-CM | POA: Diagnosis present

## 2018-09-21 DIAGNOSIS — M6281 Muscle weakness (generalized): Secondary | ICD-10-CM | POA: Diagnosis present
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
## 2018-09-24 ENCOUNTER — Other Ambulatory Visit: Payer: Self-pay

## 2018-09-24 ENCOUNTER — Ambulatory Visit: Payer: Medicare Other | Admitting: Physical Therapy

## 2018-09-24 DIAGNOSIS — M25511 Pain in right shoulder: Secondary | ICD-10-CM | POA: Diagnosis not present

## 2018-09-24 DIAGNOSIS — M25611 Stiffness of right shoulder, not elsewhere classified: Secondary | ICD-10-CM

## 2018-09-24 DIAGNOSIS — M6281 Muscle weakness (generalized): Secondary | ICD-10-CM
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
## 2018-09-28 ENCOUNTER — Ambulatory Visit: Payer: Medicare Other | Admitting: Physical Therapy

## 2018-09-28 ENCOUNTER — Other Ambulatory Visit: Payer: Self-pay

## 2018-09-28 DIAGNOSIS — M25511 Pain in right shoulder: Secondary | ICD-10-CM

## 2018-09-28 DIAGNOSIS — M6281 Muscle weakness (generalized): Secondary | ICD-10-CM

## 2018-09-28 DIAGNOSIS — M25611 Stiffness of right shoulder, not elsewhere classified: Secondary | ICD-10-CM
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
## 2018-09-30 ENCOUNTER — Ambulatory Visit: Payer: Medicare Other | Admitting: Physical Therapy

## 2018-10-01 ENCOUNTER — Encounter: Payer: Self-pay | Admitting: Physical Therapy

## 2018-10-01 ENCOUNTER — Ambulatory Visit: Payer: Medicare Other | Admitting: Physical Therapy

## 2018-10-01 ENCOUNTER — Other Ambulatory Visit: Payer: Self-pay

## 2018-10-01 DIAGNOSIS — M25511 Pain in right shoulder: Secondary | ICD-10-CM

## 2018-10-01 DIAGNOSIS — M25611 Stiffness of right shoulder, not elsewhere classified: Secondary | ICD-10-CM
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
## 2018-10-06 ENCOUNTER — Ambulatory Visit: Payer: Medicare Other | Admitting: Physical Therapy

## 2018-10-06 ENCOUNTER — Encounter: Payer: Self-pay | Admitting: Physical Therapy

## 2018-10-06 ENCOUNTER — Other Ambulatory Visit: Payer: Self-pay

## 2018-10-06 DIAGNOSIS — M6281 Muscle weakness (generalized): Secondary | ICD-10-CM

## 2018-10-06 DIAGNOSIS — M25511 Pain in right shoulder: Secondary | ICD-10-CM | POA: Diagnosis not present

## 2018-10-06 DIAGNOSIS — M25611 Stiffness of right shoulder, not elsewhere classified: Secondary | ICD-10-CM
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
## 2018-10-08 ENCOUNTER — Encounter: Payer: Self-pay | Admitting: Physical Therapy

## 2018-10-08 ENCOUNTER — Other Ambulatory Visit: Payer: Self-pay

## 2018-10-08 ENCOUNTER — Ambulatory Visit: Payer: Medicare Other | Admitting: Physical Therapy

## 2018-10-08 DIAGNOSIS — M25511 Pain in right shoulder: Secondary | ICD-10-CM

## 2018-10-08 DIAGNOSIS — M25611 Stiffness of right shoulder, not elsewhere classified: Secondary | ICD-10-CM

## 2018-10-08 DIAGNOSIS — M6281 Muscle weakness (generalized): Secondary | ICD-10-CM
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
## 2018-10-14 ENCOUNTER — Ambulatory Visit (INDEPENDENT_AMBULATORY_CARE_PROVIDER_SITE_OTHER): Payer: Medicare Other | Admitting: Orthopedic Surgery

## 2018-10-14 ENCOUNTER — Encounter: Payer: Self-pay | Admitting: Orthopedic Surgery

## 2018-10-14 ENCOUNTER — Other Ambulatory Visit: Payer: Self-pay

## 2018-10-14 DIAGNOSIS — M25511 Pain in right shoulder: Secondary | ICD-10-CM | POA: Diagnosis not present

## 2018-10-14 MED ORDER — METHYLPREDNISOLONE ACETATE 40 MG/ML IJ SUSP
40.0000 mg | INTRAMUSCULAR | Status: AC | PRN
  Administered 2018-10-14: 15:00:00 40 mg via INTRA_ARTICULAR

## 2018-10-14 MED ORDER — BUPIVACAINE HCL 0.5 % IJ SOLN
9.0000 mL | INTRAMUSCULAR | Status: AC | PRN
  Administered 2018-10-14: 15:00:00 9 mL via INTRA_ARTICULAR

## 2018-10-14 MED ORDER — LIDOCAINE HCL 1 % IJ SOLN
5.0000 mL | INTRAMUSCULAR | Status: AC | PRN
  Administered 2018-10-14: 5 mL
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
## 2018-11-18 ENCOUNTER — Other Ambulatory Visit: Payer: Self-pay

## 2018-11-18 ENCOUNTER — Ambulatory Visit
Admission: RE | Admit: 2018-11-18 | Discharge: 2018-11-18 | Disposition: A | Discharge status: Home / Self Care | Payer: Medicare Other | Source: Ambulatory Visit | Attending: Orthopedic Surgery | Admitting: Orthopedic Surgery

## 2018-11-18 DIAGNOSIS — M25511 Pain in right shoulder: Secondary | ICD-10-CM

## 2018-11-23 ENCOUNTER — Encounter: Payer: Self-pay | Admitting: Orthopedic Surgery

## 2018-11-23 ENCOUNTER — Ambulatory Visit (INDEPENDENT_AMBULATORY_CARE_PROVIDER_SITE_OTHER): Payer: Medicare Other | Admitting: Orthopedic Surgery

## 2018-11-23 DIAGNOSIS — M7551 Bursitis of right shoulder: Secondary | ICD-10-CM | POA: Diagnosis not present

## 2018-11-26 ENCOUNTER — Telehealth: Payer: Self-pay | Admitting: Orthopedic Surgery

## 2018-11-26 NOTE — Telephone Encounter (Signed)
Patient called asked what is the name of the injection she is having on 12/14/2018. The number to contact patient is [phone]

## 2018-11-26 NOTE — Telephone Encounter (Signed)
Do you know what she is referring to. Tried to look back at last OV note but not dictated yet.

## 2018-11-27 ENCOUNTER — Encounter: Payer: Self-pay | Admitting: Orthopedic Surgery
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
## 2018-11-27 NOTE — Telephone Encounter (Signed)
IC s/w patient and r/s

## 2018-11-27 NOTE — Telephone Encounter (Signed)
Repeat cortisone shot in the shoulder.  Last injection 7 1 but she needs to wait a little while longer before she gets another injection in that shoulder

## 2018-12-14 ENCOUNTER — Ambulatory Visit: Payer: Medicare Other | Admitting: Orthopedic Surgery

## 2018-12-18 ENCOUNTER — Encounter: Payer: Self-pay | Admitting: Cardiovascular Disease

## 2018-12-22 ENCOUNTER — Encounter: Payer: Self-pay | Admitting: Cardiovascular Disease

## 2018-12-22 ENCOUNTER — Ambulatory Visit (INDEPENDENT_AMBULATORY_CARE_PROVIDER_SITE_OTHER): Payer: Medicare Other | Admitting: Cardiovascular Disease

## 2018-12-22 ENCOUNTER — Other Ambulatory Visit: Payer: Self-pay

## 2018-12-22 VITALS — BP 144/69 | HR 64 | Temp 96.8°F | Ht 64.0 in | Wt 237.4 lb

## 2018-12-22 DIAGNOSIS — I10 Essential (primary) hypertension: Secondary | ICD-10-CM | POA: Diagnosis not present

## 2018-12-22 DIAGNOSIS — R0609 Other forms of dyspnea: Secondary | ICD-10-CM | POA: Insufficient documentation

## 2018-12-22 DIAGNOSIS — R600 Localized edema: Secondary | ICD-10-CM

## 2018-12-22 DIAGNOSIS — R0600 Dyspnea, unspecified: Secondary | ICD-10-CM

## 2018-12-22 MED ORDER — ASPIRIN 81 MG PO TBEC: 81.0000 mg | DELAYED_RELEASE_TABLET | Freq: Every day | ORAL | 3 refills | Status: DC

## 2018-12-22 NOTE — Assessment & Plan Note (Signed)
History of bilateral lower extremity edema on 3 times daily Lasix probably related to diastolic dysfunction

## 2018-12-22 NOTE — Progress Notes (Signed)
12/22/2018 TOM BALVIN   03-25-1941  [id number]  Primary Physician Corrington, Delsa Grana, MD Primary Cardiologist: Lorretta Harp MD FACP, [place], [place], [place]  HPI:  Leslie Hendricks is a 78 y.o.  mildly overweight, widowed Caucasian female with no children who lives alone. Her husband of 80 years died on 04-27-09. She has retired from the [employer]. I last saw her in the office  06/12/2018. Her risk factors include 45 pack-year history of tobacco abuse, having quit 12 years ago; treated hypertension; diabetes; and hyperlipidemia. She does have a strong family history of heart disease. Both parents had bypass surgery. Three of her siblings have myocardial infractions. She had a negative pharmacologic stress test on July 03, 2010. She denies chest pain or shortness of breath. Her major complaints are of lack of stability on her legs, but she really denies claudication. She walks with the aid of a walker. She had arterial Dopplers performed in our office in March of 2012 revealing ABI of 0.98 on the right and 0.77 on the left. She has a very limited functional existence from bed to chair. She rarely gets out of the house. Since I saw her back over one year ago she's had no cardiovascular complaints.she was evaluated by Dr. Servando Snare because of the lung nodule that was not metabolic and therefore the decision was made to follow this conservatively. She apparently needs a knee replacement and was referred back here for cardiovascular clearance. I saw her over a year ago she now walks with a walker but still is has limited mobility. She gets occasional atypical chest pain and has complained of some palpitations since her rotator cuff surgery in October of last year. I performed a 30 day event monitor which showed only unifocal PVCs.  Since I saw her 6 months ago, she has been complaining of increasing dyspnea on exertion.  She was placed on a diuretic because of lower extreme edema by her PCP.   Her last Myoview performed 07/03/2010 was nonischemic and 2D echo performed 2016 showed normal LV function.  She denies chest pain.   Current Meds  Medication Sig  . Ascorbic Acid (VITAMIN C-ROSE HIPS CR) 1000 MG TBCR Take 1 tablet by mouth daily.  Marland Kitchen aspirin EC 325 MG tablet Take 325 mg by mouth daily.  Marland Kitchen atorvastatin (LIPITOR) 80 MG tablet Take 80 mg by mouth at bedtime.   . Calcium Carb-Cholecalciferol (CALCIUM 600/VITAMIN D3) 600-800 MG-UNIT TABS Take 1 tablet by mouth daily.  . Cholecalciferol (VITAMIN D-3) 1000 UNITS CAPS Take 1 capsule by mouth daily.  . folic acid (FOLVITE) A999333 MCG tablet Take 400 mcg by mouth daily.  . furosemide (LASIX) 20 MG tablet Take 40 mg by mouth 3 (three) times daily.   Marland Kitchen HYDROcodone-acetaminophen (NORCO/VICODIN) 5-325 MG tablet Take 1 tablet by mouth daily.  Marland Kitchen losartan (COZAAR) 25 MG tablet Take 1 tablet by mouth daily.  . Magnesium 250 MG TABS Take 1 tablet by mouth daily.  . metoprolol succinate (TOPROL XL) 25 MG 24 hr tablet Take 0.5 tablets (12.5 mg total) by mouth daily. Can take a extra half a tablet for palpitations  . Multiple Vitamins-Minerals (VISION FORMULA/LUTEIN PO) Take 1 capsule by mouth daily.  Marland Kitchen omega-3 fish oil (MAXEPA) 1000 MG CAPS capsule Take 1 capsule by mouth daily.  Vladimir Faster Glycol-Propyl Glycol (SYSTANE OP) Place 1 drop into both eyes 2 (two) times daily.   . traZODone (DESYREL) 100 MG tablet Take 100 mg  by mouth at bedtime.  Marland Kitchen venlafaxine (EFFEXOR-XR) 75 MG 24 hr capsule Take 75 mg by mouth daily with breakfast.   . vitamin E 400 UNIT capsule Take 400 Units by mouth daily.  Marland Kitchen zinc gluconate 50 MG tablet Take 50 mg by mouth daily.     Allergies  Allergen Reactions  . Banana Nausea And Vomiting  . Codeine Nausea And Vomiting  . Dilaudid [Hydromorphone] Other (See Comments)    Altered mental status  . Oxycodone Hcl Nausea And Vomiting  . Sausage [Pickled Meat] Nausea And Vomiting    Social History   Socioeconomic History   . Marital status: Widowed    Spouse name: Not on file  . Number of children: Not on file  . Years of education: Not on file  . Highest education level: Not on file  Occupational History  . Not on file  Social Needs  . Financial resource strain: Not on file  . Food insecurity    Worry: Not on file    Inability: Not on file  . Transportation needs    Medical: Not on file    Non-medical: Not on file  Tobacco Use  . Smoking status: Former Smoker    Packs/day: 1.00    Years: 30.00    Pack years: 30.00    Types: Cigarettes    Quit date: 04/15/1998    Years since quitting: 20.7  . Smokeless tobacco: Never Used  Substance and Sexual Activity  . Alcohol use: No  . Drug use: No  . Sexual activity: Never    Birth control/protection: Surgical  Lifestyle  . Physical activity    Days per week: Not on file    Minutes per session: Not on file  . Stress: Not on file  Relationships  . Social Herbalist on phone: Not on file    Gets together: Not on file    Attends religious service: Not on file    Active member of club or organization: Not on file    Attends meetings of clubs or organizations: Not on file    Relationship status: Not on file  . Intimate partner violence    Fear of current or ex partner: Not on file    Emotionally abused: Not on file    Physically abused: Not on file    Forced sexual activity: Not on file  Other Topics Concern  . Not on file  Social History Narrative  . Not on file     Review of Systems: General: negative for chills, fever, night sweats or weight changes.  Cardiovascular: negative for chest pain, dyspnea on exertion, edema, orthopnea, palpitations, paroxysmal nocturnal dyspnea or shortness of breath Dermatological: negative for rash Respiratory: negative for cough or wheezing Urologic: negative for hematuria Abdominal: negative for nausea, vomiting, diarrhea, bright red blood per rectum, melena, or hematemesis Neurologic: negative for  visual changes, syncope, or dizziness All other systems reviewed and are otherwise negative except as noted above.    Blood pressure (!) 163/73, pulse 64, temperature (!) 96.8 F (36 C), height 5\' 4"  (1.626 m), weight 237 lb 6.4 oz (107.7 kg).  General appearance: alert and no distress Neck: no adenopathy, no carotid bruit, no JVD, supple, symmetrical, trachea midline and thyroid not enlarged, symmetric, no tenderness/mass/nodules Lungs: clear to auscultation bilaterally Heart: regular rate and rhythm, S1, S2 normal, no murmur, click, rub or gallop Extremities: extremities normal, atraumatic, no cyanosis or edema Pulses: 2+ and symmetric Skin: Skin color, texture,  turgor normal. No rashes or lesions Neurologic: Alert and oriented X 3, normal strength and tone. Normal symmetric reflexes. Normal coordination and gait  EKG sinus rhythm at 64 without ST or T wave changes.  Personally reviewed this EKG.  ASSESSMENT AND PLAN:   HYPERLIPIDEMIA History of hyperlipidemia on statin therapy followed by her PCP  Essential hypertension History of essential hypertension with blood pressure measured today 163/73.  She is on losartan and metoprolol.  Bilateral lower extremity edema History of bilateral lower extremity edema on 3 times daily Lasix probably related to diastolic dysfunction  PALPITATIONS History of palpitations found to be PVCs on 30-day event monitoring.  Dyspnea on exertion New complaints of dyspnea on exertion since I saw her last 6 months ago with negative Myoview 07/03/2010 and 2D echo performed in 2016 that was normal as well.  We will recheck a 2D echo and Myoview stress test.      Lorretta Harp MD [facility], [facility] 12/22/2018 11:57 AM

## 2018-12-22 NOTE — Assessment & Plan Note (Signed)
History of hyperlipidemia on statin therapy followed by her PCP. 

## 2018-12-22 NOTE — Assessment & Plan Note (Signed)
History of essential hypertension with blood pressure measured today 163/73.  She is on losartan and metoprolol.

## 2018-12-22 NOTE — Patient Instructions (Addendum)
Medication Instructions:  Your physician has recommended you make the following change in your medication:   DECREASE YOUR ASPIRIN TO 81 MG BY MOUTH DAILY   If you need a refill on your cardiac medications before your next appointment, please call your pharmacy.   Lab work: NONE If you have labs (blood work) drawn today and your tests are completely normal, you will receive your results only by: Marland Kitchen MyChart Message (if you have MyChart) OR . A paper copy in the mail If you have any lab test that is abnormal or we need to change your treatment, we will call you to review the results.  Testing/Procedures: Your physician has requested that you have an echocardiogram. Echocardiography is a painless test that uses sound waves to create images of your heart. It provides your doctor with information about the size and shape of your heart and how well your heart's chambers and valves are working. This procedure takes approximately one hour. There are no restrictions for this procedure. LOCATION: [facility]: [place], [place], [place] [address]   Your physician has requested that you have a lexiscan myoview. For further information please visit [email or web address]. Please follow instruction sheet, as given.  Cardiac Nuclear Scan A cardiac nuclear scan is a test that is done to check the flow of blood to your heart. It is done when you are resting and when you are exercising. The test looks for problems such as:  Not enough blood reaching a portion of the heart.  The heart muscle not working as it should. You may need this test if:  You have heart disease.  You have had lab results that are not normal.  You have had heart surgery or a balloon procedure to open up blocked arteries (angioplasty).  You have chest pain.  You have shortness of breath. In this test, a special dye (tracer) is put into your bloodstream. The tracer will travel to your heart. A camera will  then take pictures of your heart to see how the tracer moves through your heart. This test is usually done at a hospital and takes 2-4 hours. Tell a doctor about:  Any allergies you have.  All medicines you are taking, including vitamins, herbs, eye drops, creams, and over-the-counter medicines.  Any problems you or family members have had with anesthetic medicines.  Any blood disorders you have.  Any surgeries you have had.  Any medical conditions you have.  Whether you are pregnant or may be pregnant. What are the risks? Generally, this is a safe test. However, problems may occur, such as:  Serious chest pain and heart attack. This is only a risk if the stress portion of the test is done.  Rapid heartbeat.  A feeling of warmth in your chest. This feeling usually does not last long.  Allergic reaction to the tracer. What happens before the test?  Ask your doctor about changing or stopping your normal medicines. This is important.  Follow instructions from your doctor about what you cannot eat or drink.  Remove your jewelry on the day of the test. What happens during the test?  An IV tube will be inserted into one of your veins.  Your doctor will give you a small amount of tracer through the IV tube.  You will wait for 20-40 minutes while the tracer moves through your bloodstream.  Your heart will be monitored with an electrocardiogram (ECG).  You will lie down on an exam  table.  Pictures of your heart will be taken for about 15-20 minutes.  You may also have a stress test. For this test, one of these things may be done: ? You will be asked to exercise on a treadmill or a stationary bike. ? You will be given medicines that will make your heart work harder. This is done if you are unable to exercise.  When blood flow to your heart has peaked, a tracer will again be given through the IV tube.  After 20-40 minutes, you will get back on the exam table. More pictures  will be taken of your heart.  Depending on the tracer that is used, more pictures may need to be taken 3-4 hours later.  Your IV tube will be removed when the test is over. The test may vary among doctors and hospitals. What happens after the test?  Ask your doctor: ? Whether you can return to your normal schedule, including diet, activities, and medicines. ? Whether you should drink more fluids. This will help to remove the tracer from your body. Drink enough fluid to keep your pee (urine) pale yellow.  Ask your doctor, or the department that is doing the test: ? When will my results be ready? ? How will I get my results? Summary  A cardiac nuclear scan is a test that is done to check the flow of blood to your heart.  Tell your doctor whether you are pregnant or may be pregnant.  Before the test, ask your doctor about changing or stopping your normal medicines. This is important.  Ask your doctor whether you can return to your normal activities. You may be asked to drink more fluids.  Follow-Up: At [facility], you and your health needs are our priority.  As part of our continuing mission to provide you with exceptional heart care, we have created designated Provider Care Teams.  These Care Teams include your primary Cardiologist (physician) and Advanced Practice Providers (APPs -  Physician Assistants and Nurse Practitioners) who all work together to provide you with the care you need, when you need it. You will need a follow up appointment in 6 months with Dr. Quay Burow.  Please call our office 2 months in advance to schedule this appointment.

## 2018-12-22 NOTE — Assessment & Plan Note (Signed)
New complaints of dyspnea on exertion since I saw her last 6 months ago with negative Myoview 07/03/2010 and 2D echo performed in 2016 that was normal as well.  We will recheck a 2D echo and Myoview stress test.

## 2018-12-22 NOTE — Assessment & Plan Note (Signed)
History of palpitations found to be PVCs on 30-day event monitoring.

## 2018-12-30 ENCOUNTER — Ambulatory Visit (HOSPITAL_COMMUNITY): Payer: Medicare Other | Attending: Cardiology

## 2018-12-30 ENCOUNTER — Other Ambulatory Visit: Payer: Self-pay

## 2018-12-30 DIAGNOSIS — R0609 Other forms of dyspnea: Secondary | ICD-10-CM | POA: Insufficient documentation

## 2018-12-30 DIAGNOSIS — R0600 Dyspnea, unspecified: Secondary | ICD-10-CM

## 2018-12-31 ENCOUNTER — Telehealth (HOSPITAL_COMMUNITY): Payer: Self-pay

## 2018-12-31 NOTE — Telephone Encounter (Signed)
Encounter complete. 

## 2019-01-01 ENCOUNTER — Telehealth: Payer: Self-pay | Admitting: Cardiovascular Disease

## 2019-01-01 NOTE — Telephone Encounter (Signed)
New Message    Patient needs Echo results faxed to her doctors office please call patient back.

## 2019-01-01 NOTE — Telephone Encounter (Signed)
Forwarded ECHO to PCP via EPIC fax function

## 2019-01-04 NOTE — Telephone Encounter (Signed)
Notes recorded by Lorretta Harp, MD on 12/30/2018 at 4:50 PM EDT  Essentially normal study. Repeat when clinically indicated.

## 2019-01-04 NOTE — Telephone Encounter (Signed)
Patient aware of results. Routed to Dr. Clydell Hakim per request

## 2019-01-04 NOTE — Telephone Encounter (Signed)
° ° °  Please call patient with echo results.  Also, fax results to Dr Maryjean Ka at [phone]

## 2019-01-05 ENCOUNTER — Ambulatory Visit (HOSPITAL_COMMUNITY)
Admission: RE | Admit: 2019-01-05 | Discharge: 2019-01-05 | Disposition: A | Discharge status: Home / Self Care | Payer: Medicare Other | Source: Ambulatory Visit | Attending: Cardiovascular Disease | Admitting: Cardiovascular Disease

## 2019-01-05 ENCOUNTER — Other Ambulatory Visit: Payer: Self-pay

## 2019-01-05 DIAGNOSIS — R0609 Other forms of dyspnea: Secondary | ICD-10-CM | POA: Insufficient documentation

## 2019-01-05 DIAGNOSIS — R0600 Dyspnea, unspecified: Secondary | ICD-10-CM

## 2019-01-05 MED ORDER — REGADENOSON 0.4 MG/5ML IV SOLN
0.4000 mg | Freq: Once | INTRAVENOUS | Status: AC
  Administered 2019-01-05: 0.4 mg via INTRAVENOUS

## 2019-01-05 MED ORDER — TECHNETIUM TC 99M TETROFOSMIN IV KIT
31.0000 | PACK | Freq: Once | INTRAVENOUS | Status: AC | PRN
  Administered 2019-01-05: 31 via INTRAVENOUS
  Filled 2019-01-05: qty 31

## 2019-01-05 MED ORDER — AMINOPHYLLINE 25 MG/ML IV SOLN
75.0000 mg | Freq: Once | INTRAVENOUS | Status: AC
  Administered 2019-01-05: 75 mg via INTRAVENOUS

## 2019-01-06 ENCOUNTER — Ambulatory Visit (HOSPITAL_COMMUNITY)
Admission: RE | Admit: 2019-01-06 | Discharge: 2019-01-06 | Disposition: A | Discharge status: Home / Self Care | Payer: Medicare Other | Source: Ambulatory Visit | Attending: Cardiovascular Disease | Admitting: Cardiovascular Disease

## 2019-01-06 LAB — MYOCARDIAL PERFUSION IMAGING
LV dias vol: 75 mL (ref 46–106)
LV sys vol: 24 mL
Peak HR: 81 {beats}/min
Rest HR: 63 {beats}/min
SDS: 6
SRS: 1
SSS: 7
TID: 0.86

## 2019-01-06 MED ORDER — TECHNETIUM TC 99M TETROFOSMIN IV KIT
29.9000 | PACK | Freq: Once | INTRAVENOUS | Status: AC | PRN
  Administered 2019-01-06: 29.9 via INTRAVENOUS

## 2019-01-18 ENCOUNTER — Ambulatory Visit: Payer: Medicare Other | Admitting: Orthopedic Surgery

## 2019-01-20 DIAGNOSIS — Q761 Klippel-Feil syndrome: Secondary | ICD-10-CM | POA: Insufficient documentation

## 2019-03-16 ENCOUNTER — Other Ambulatory Visit: Payer: Self-pay

## 2019-03-16 ENCOUNTER — Encounter: Payer: Self-pay | Admitting: Podiatry

## 2019-03-16 ENCOUNTER — Ambulatory Visit (INDEPENDENT_AMBULATORY_CARE_PROVIDER_SITE_OTHER): Payer: Medicare Other | Admitting: Podiatry

## 2019-03-16 DIAGNOSIS — M19072 Primary osteoarthritis, left ankle and foot: Secondary | ICD-10-CM | POA: Diagnosis not present

## 2019-03-16 DIAGNOSIS — M778 Other enthesopathies, not elsewhere classified: Secondary | ICD-10-CM | POA: Diagnosis not present

## 2019-03-16 DIAGNOSIS — M722 Plantar fascial fibromatosis: Secondary | ICD-10-CM | POA: Diagnosis not present

## 2019-03-16 NOTE — Progress Notes (Signed)
She presents today with chief complaint of pain to the left foot and swelling.  States that she went to urgent care because of the pain in the foot on Saturday at [facility] and they did x-rays.  She stated that she has some ankle pain but not nearly as painful as the top of the foot and the bottom of the foot.  Objective: Vital signs are stable she is alert and oriented x3.  Pulses are palpable.  There is mild edema about the bilateral lower extremity left it seems to be more painful.  Pulses are palpable.  Neurologic sensorium is intact degenerative flexors are intact.  Muscle strength appears to be normal.  She has pain on palpation of the TMT joints with palpable nodularity associated with osteoarthritis of the left foot.  She is currently wearing a cam walker from [facility].  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  Plantar fasciitis appears to be resolving.  Plan: Discussed etiology pathology conservative surgical therapies at this point I injected 20 mg of Kenalog dorsally 20 mg of Kenalog plantarly in the TMT area.  This was all performed after local anesthetic as well as a sterile Betadine skin prep.  I will follow-up with her in a few months.
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
## 2019-06-28 ENCOUNTER — Encounter: Payer: Self-pay | Admitting: General Practice

## 2019-06-28 ENCOUNTER — Telehealth (INDEPENDENT_AMBULATORY_CARE_PROVIDER_SITE_OTHER): Payer: Medicare Other | Admitting: General Practice

## 2019-06-28 VITALS — Ht 64.0 in | Wt 227.0 lb

## 2019-06-28 DIAGNOSIS — R600 Localized edema: Secondary | ICD-10-CM

## 2019-06-28 DIAGNOSIS — R002 Palpitations: Secondary | ICD-10-CM

## 2019-06-28 DIAGNOSIS — E7800 Pure hypercholesterolemia, unspecified: Secondary | ICD-10-CM

## 2019-06-28 DIAGNOSIS — I10 Essential (primary) hypertension: Secondary | ICD-10-CM

## 2019-06-28 DIAGNOSIS — R0609 Other forms of dyspnea: Secondary | ICD-10-CM

## 2019-06-28 DIAGNOSIS — R0600 Dyspnea, unspecified: Secondary | ICD-10-CM

## 2019-06-28 NOTE — Patient Instructions (Addendum)
Medication Instructions:  Your physician recommends that you continue on your current medications as directed. Please refer to the Current Medication list given to you today.  TAKE YOU METOPROLOL IN THE AFTERNOON.  *If you need a refill on your cardiac medications before your next appointment, please call your pharmacy*   Lab Work: NONE If you have labs (blood work) drawn today and your tests are completely normal, you will receive your results only by: Marland Kitchen MyChart Message (if you have MyChart) OR . A paper copy in the mail If you have any lab test that is abnormal or we need to change your treatment, we will call you to review the results.   Testing/Procedures: NONE   Follow-Up: At [facility], you and your health needs are our priority.  As part of our continuing mission to provide you with exceptional heart care, we have created designated Provider Care Teams.  These Care Teams include your primary Cardiologist (physician) and Advanced Practice Providers (APPs -  Physician Assistants and Nurse Practitioners) who all work together to provide you with the care you need, when you need it.  We recommend signing up for the patient portal called "MyChart".  Sign up information is provided on this After Visit Summary.  MyChart is used to connect with patients for Virtual Visits (Telemedicine).  Patients are able to view lab/test results, encounter notes, upcoming appointments, etc.  Non-urgent messages can be sent to your provider as well.   To learn more about what you can do with MyChart, go to [email or web address].    Your next appointment:   6 month(s)  The format for your next appointment:   In Person  Provider:   You may see Quay Burow, MD or one of the following Advanced Practice Providers on your designated Care Team:    Kerin Ransom, PA-C  [place], [place]  Coletta Memos, [place]    Other Instructions  1. DRINK ONLY 1-2 GLASSES OF DECAFFEINATED TEA IN THE  MORNING, STOP DRINKING TEA IN THE AFTERNOON.  2. WHERE LOWER EXTREMITY SUPPORT STOCKINGS  3. ELEVATE YOU LEGS THROUGH THE DAY WHEN YOU ARE NOT ACTIVE.

## 2019-07-15 DIAGNOSIS — F1120 Opioid dependence, uncomplicated: Secondary | ICD-10-CM | POA: Insufficient documentation

## 2019-07-19 ENCOUNTER — Encounter (INDEPENDENT_AMBULATORY_CARE_PROVIDER_SITE_OTHER): Payer: Medicare Other | Admitting: Ophthalmology

## 2019-07-21 ENCOUNTER — Other Ambulatory Visit: Payer: Self-pay

## 2019-07-21 ENCOUNTER — Ambulatory Visit (INDEPENDENT_AMBULATORY_CARE_PROVIDER_SITE_OTHER): Payer: Medicare Other | Admitting: Ophthalmology

## 2019-07-21 ENCOUNTER — Encounter (INDEPENDENT_AMBULATORY_CARE_PROVIDER_SITE_OTHER): Payer: Self-pay | Admitting: Ophthalmology

## 2019-07-21 DIAGNOSIS — H35052 Retinal neovascularization, unspecified, left eye: Secondary | ICD-10-CM

## 2019-07-21 DIAGNOSIS — H35352 Cystoid macular degeneration, left eye: Secondary | ICD-10-CM

## 2019-07-21 DIAGNOSIS — D3132 Benign neoplasm of left choroid: Secondary | ICD-10-CM

## 2019-07-21 DIAGNOSIS — H353212 Exudative age-related macular degeneration, right eye, with inactive choroidal neovascularization: Secondary | ICD-10-CM | POA: Diagnosis not present

## 2019-07-21 DIAGNOSIS — H353221 Exudative age-related macular degeneration, left eye, with active choroidal neovascularization: Secondary | ICD-10-CM

## 2019-07-21 DIAGNOSIS — H353114 Nonexudative age-related macular degeneration, right eye, advanced atrophic with subfoveal involvement: Secondary | ICD-10-CM

## 2019-07-21 DIAGNOSIS — H3322 Serous retinal detachment, left eye: Secondary | ICD-10-CM

## 2019-07-21 DIAGNOSIS — H353123 Nonexudative age-related macular degeneration, left eye, advanced atrophic without subfoveal involvement: Secondary | ICD-10-CM

## 2019-07-21 DIAGNOSIS — H43811 Vitreous degeneration, right eye: Secondary | ICD-10-CM | POA: Insufficient documentation

## 2019-07-21 DIAGNOSIS — H35362 Drusen (degenerative) of macula, left eye: Secondary | ICD-10-CM

## 2019-07-21 DIAGNOSIS — H35359 Cystoid macular degeneration, unspecified eye: Secondary | ICD-10-CM | POA: Insufficient documentation

## 2019-07-21 DIAGNOSIS — H35722 Serous detachment of retinal pigment epithelium, left eye: Secondary | ICD-10-CM

## 2019-07-21 MED ORDER — AFLIBERCEPT 2MG/0.05ML IZ SOLN FOR KALEIDOSCOPE
2.0000 mg | INTRAVITREAL | Status: AC | PRN
  Administered 2019-07-21: 12:00:00 2 mg via INTRAVITREAL

## 2019-07-21 NOTE — Patient Instructions (Signed)
Age-Related Macular Degeneration  Age-related macular degeneration (AMD) is an eye disease related to aging. The disease causes a loss of central vision. Central vision allows a person to see objects clearly and do daily tasks like reading and driving. There are two main types of AMD:  Dry AMD. People with this type generally lose their vision slowly. This is the most common type of AMD. Some people with dry AMD notice very little change in their vision as they age.  Wet AMD. People with this type can lose their vision quickly. What are the causes? This condition is caused by damage to the part of the eye that provides you with central vision (macula).  Dry AMD happens when deposits in the macula cause light-sensitive cells to slowly break down.  Wet AMD happens when abnormal blood vessels grow under the macula and leak blood and fluid. What increases the risk? You are more likely to develop this condition if you:  Are 50 years old or older, and especially 75 years old or older.  Smoke.  Are obese.  Have a family history of AMD.  Have high cholesterol, high blood pressure, or heart disease.  Have been exposed to high levels of ultraviolet (UV) light and blue light.  Are white (Caucasian).  Are female. What are the signs or symptoms? Common symptoms of this condition include:  Blurred vision, especially when reading print material. The blurred vision often improves in brighter light.  A blurred or blind spot in the center of your field of vision that is small but growing larger.  Bright colors seeming less bright than they used to be.  Decreased ability to recognize and see faces.  One eye seeing worse than the other.  Decreased ability to adapt to dimly lit rooms.  Straight lines appearing crooked or wavy. How is this diagnosed? This condition is diagnosed based on your symptoms and an eye exam. During the eye exam:  Eye drops will be placed into your eyes to  enlarge (dilate) your pupils. This will allow your health care provider to see the back of your eye.  You may be asked to look at an image that looks like a checkerboard (Amsler grid). Early changes in your central vision may cause the grid to appear distorted. After the exam, you may be given one or both of these tests:  Fluorescein angiogram. This test determines whether you have dry or wet AMD.  Optical coherence tomography (OCT) test to evaluate deep layers of the retina. How is this treated? There is no cure for this condition, but treatment can help to slow down progression of the disease. This condition may be treated with:  Supplements, including vitamin C, vitamin E, beta carotene, and zinc.  Laser surgery to destroy new blood vessels or leaking blood vessels in your eye.  Injections of medicines into your eye to slow down the formation of abnormal blood vessels that may leak. These injections may need to be repeated on a routine basis. Follow these instructions at home:  Take over-the-counter and prescription medicines only as told by your health care provider.  Take vitamins and supplements as told by your health care provider.  Ask your health care provider for an Amsler grid. Use it every day to check each eye for vision changes.  Get an eye exam as often as told by your health care provider. Make sure to get an eye exam at least once every year.  Keep all follow-up visits as told by   your health care provider. This is important. Contact a health care provider if:  You notice any new changes in your vision. Get help right away if:  You suddenly lose vision or develop pain in the eye. Summary  Age-related macular degeneration (AMD) is an eye disease related to aging. There are two types of this condition: dry AMD and wet AMD.  This condition is caused by damage to the part of the eye that provides you with central vision (macula).  Once diagnosed with AMD, make sure  to get an eye exam every year, take supplements and vitamins as directed, use an Amsler grid at home, and follow up with your health care provider. This information is not intended to replace advice given to you by your health care provider. Make sure you discuss any questions you have with your health care provider. Document Revised: 10/08/2017 Document Reviewed: 10/08/2017 Elsevier Patient Education  [place].   The nature of wet macular degeneration was discussed with the patient.  Forms of therapy reviewed include the use of Anti-VEGF medications injected painlessly into the eye, as well as other possible treatment modalities, including thermal laser therapy. Fellow eye involvement and risks were discussed with the patient. Upon the finding of wet age related macular degeneration, treatment will be offered. The treatment regimen is on a treat as needed basis with the intent to treat if necessary and extend interval of exams when possible. On average 1 out of 6 patients do not need lifetime therapy. However, the risk of recurrent disease is high for a lifetime.  Initially monthly, then periodic, examinations and evaluations will determine whether the next treatment is required on the day of the examination.
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
## 2019-07-22 NOTE — Addendum Note (Signed)
Addended by: Dartha Lodge on: 07/22/2019 08:51 AM   Modules accepted: Orders

## 2019-08-19 ENCOUNTER — Encounter (HOSPITAL_COMMUNITY): Payer: Self-pay

## 2019-08-19 ENCOUNTER — Ambulatory Visit (HOSPITAL_COMMUNITY): Payer: Medicare Other | Attending: Ophthalmology

## 2019-08-19 ENCOUNTER — Other Ambulatory Visit: Payer: Self-pay

## 2019-08-19 DIAGNOSIS — H5410 Blindness, one eye, low vision other eye, unspecified eyes: Secondary | ICD-10-CM | POA: Diagnosis present

## 2019-08-19 NOTE — Patient Instructions (Signed)
Low Vision Evaluation Recommendations:  1) When attempting to read bill statements, etc. hold your paper off the table; at roughly a 45-degree angle at a normal distance from you. Next place the magnifying glass on the paper/statement and lift off slowly until the needed magnification is there and you're able to read.   Bring at next appointment:  Magnifying glass A sample of a bill statement Your watch
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
## 2019-08-25 ENCOUNTER — Other Ambulatory Visit: Payer: Self-pay

## 2019-08-25 ENCOUNTER — Ambulatory Visit (HOSPITAL_COMMUNITY): Payer: Medicare Other

## 2019-08-25 ENCOUNTER — Encounter (HOSPITAL_COMMUNITY): Payer: Self-pay

## 2019-08-25 DIAGNOSIS — H5410 Blindness, one eye, low vision other eye, unspecified eyes: Secondary | ICD-10-CM

## 2019-08-25 NOTE — Patient Instructions (Signed)
Color Contrast Sunglasses  Recommend trying one shade darker.   [email or web address]  Local provider:  [facility] [address] [place] [address] [place], [place] Phone: [phone]
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
## 2019-09-08 ENCOUNTER — Ambulatory Visit (INDEPENDENT_AMBULATORY_CARE_PROVIDER_SITE_OTHER): Payer: Medicare Other | Admitting: Ophthalmology

## 2019-09-08 ENCOUNTER — Other Ambulatory Visit: Payer: Self-pay

## 2019-09-08 ENCOUNTER — Encounter (INDEPENDENT_AMBULATORY_CARE_PROVIDER_SITE_OTHER): Payer: Self-pay | Admitting: Ophthalmology

## 2019-09-08 DIAGNOSIS — H353221 Exudative age-related macular degeneration, left eye, with active choroidal neovascularization: Secondary | ICD-10-CM

## 2019-09-08 MED ORDER — AFLIBERCEPT 2MG/0.05ML IZ SOLN FOR KALEIDOSCOPE
2.0000 mg | INTRAVITREAL | Status: AC | PRN
  Administered 2019-09-08: 2 mg via INTRAVITREAL
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
## 2019-10-20 ENCOUNTER — Encounter (INDEPENDENT_AMBULATORY_CARE_PROVIDER_SITE_OTHER): Payer: Self-pay | Admitting: Ophthalmology

## 2019-10-20 ENCOUNTER — Other Ambulatory Visit: Payer: Self-pay

## 2019-10-20 ENCOUNTER — Ambulatory Visit (INDEPENDENT_AMBULATORY_CARE_PROVIDER_SITE_OTHER): Payer: Medicare Other | Admitting: Ophthalmology

## 2019-10-20 DIAGNOSIS — H353221 Exudative age-related macular degeneration, left eye, with active choroidal neovascularization: Secondary | ICD-10-CM

## 2019-10-20 MED ORDER — AFLIBERCEPT 2MG/0.05ML IZ SOLN FOR KALEIDOSCOPE
2.0000 mg | INTRAVITREAL | Status: AC | PRN
  Administered 2019-10-20: 2 mg via INTRAVITREAL
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
## 2019-10-20 NOTE — Assessment & Plan Note (Signed)
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

## 2019-10-27 ENCOUNTER — Encounter (INDEPENDENT_AMBULATORY_CARE_PROVIDER_SITE_OTHER): Payer: Medicare Other | Admitting: Ophthalmology

## 2019-11-30 ENCOUNTER — Other Ambulatory Visit: Payer: Self-pay | Admitting: Cardiovascular Disease

## 2019-11-30 MED ORDER — METOPROLOL SUCCINATE ER 25 MG PO TB24: 12.5000 mg | ORAL_TABLET | Freq: Every day | ORAL | 0 refills | Status: DC

## 2019-11-30 NOTE — Telephone Encounter (Signed)
Rx(s) sent to pharmacy electronically.  

## 2019-11-30 NOTE — Telephone Encounter (Signed)
*  STAT* If patient is at the pharmacy, call can be transferred to refill team.   1. Which medications need to be refilled? (please list name of each medication and dose if known)  metoprolol succinate (TOPROL XL) 25 MG 24 hr tablet  2. Which pharmacy/location (including street and city if local pharmacy) is medication to be sent to? [facility], [address]  3. Do they need a 30 day or 90 day supply? 90  Patient is out of medciation

## 2019-12-08 ENCOUNTER — Encounter (INDEPENDENT_AMBULATORY_CARE_PROVIDER_SITE_OTHER): Payer: Self-pay | Admitting: Ophthalmology

## 2019-12-08 ENCOUNTER — Other Ambulatory Visit: Payer: Self-pay

## 2019-12-08 ENCOUNTER — Ambulatory Visit (INDEPENDENT_AMBULATORY_CARE_PROVIDER_SITE_OTHER): Payer: Medicare Other | Admitting: Ophthalmology

## 2019-12-08 DIAGNOSIS — H353221 Exudative age-related macular degeneration, left eye, with active choroidal neovascularization: Secondary | ICD-10-CM

## 2019-12-08 DIAGNOSIS — H353212 Exudative age-related macular degeneration, right eye, with inactive choroidal neovascularization: Secondary | ICD-10-CM

## 2019-12-08 MED ORDER — AFLIBERCEPT 2MG/0.05ML IZ SOLN FOR KALEIDOSCOPE
2.0000 mg | INTRAVITREAL | Status: AC | PRN
  Administered 2019-12-08: 2 mg via INTRAVITREAL
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
## 2019-12-08 NOTE — Assessment & Plan Note (Signed)
Stable macular anatomy OS with good acuity on intravitreal Eylea at 68, repeat today examination in 8 months

## 2019-12-08 NOTE — Patient Instructions (Signed)
Patient to report any new onset visual acuity decline or distortion

## 2019-12-23 DIAGNOSIS — Z860100 Personal history of colon polyps, unspecified: Secondary | ICD-10-CM | POA: Insufficient documentation

## 2019-12-23 DIAGNOSIS — K5520 Angiodysplasia of colon without hemorrhage: Secondary | ICD-10-CM | POA: Insufficient documentation

## 2019-12-27 ENCOUNTER — Other Ambulatory Visit: Payer: Self-pay

## 2019-12-27 ENCOUNTER — Encounter: Payer: Self-pay | Admitting: Cardiovascular Disease

## 2019-12-27 ENCOUNTER — Ambulatory Visit (INDEPENDENT_AMBULATORY_CARE_PROVIDER_SITE_OTHER): Payer: Medicare Other | Admitting: Cardiovascular Disease

## 2019-12-27 DIAGNOSIS — R0600 Dyspnea, unspecified: Secondary | ICD-10-CM | POA: Diagnosis not present

## 2019-12-27 DIAGNOSIS — R002 Palpitations: Secondary | ICD-10-CM

## 2019-12-27 DIAGNOSIS — I10 Essential (primary) hypertension: Secondary | ICD-10-CM | POA: Diagnosis not present

## 2019-12-27 DIAGNOSIS — I493 Ventricular premature depolarization: Secondary | ICD-10-CM | POA: Diagnosis not present

## 2019-12-27 DIAGNOSIS — R600 Localized edema: Secondary | ICD-10-CM

## 2019-12-27 DIAGNOSIS — R0609 Other forms of dyspnea: Secondary | ICD-10-CM

## 2019-12-27 MED ORDER — METOPROLOL SUCCINATE ER 25 MG PO TB24: 25.0000 mg | ORAL_TABLET | Freq: Every day | ORAL | 3 refills | Status: DC

## 2019-12-27 NOTE — Patient Instructions (Signed)
Medication Instructions:   INCREASE Metoprolol Succinate (Toprol-XL) to 25 mg  *If you need a refill on your cardiac medications before your next appointment, please call your pharmacy*  Lab Work: NONE ordered at this time of appointment   If you have labs (blood work) drawn today and your tests are completely normal, you will receive your results only by: Marland Kitchen MyChart Message (if you have MyChart) OR . A paper copy in the mail If you have any lab test that is abnormal or we need to change your treatment, we will call you to review the results.   Testing/Procedures: Your physician has requested that you have a lower or upper extremity venous duplex. This test is an ultrasound of the veins in the legs or arms. It looks at venous blood flow that carries blood from the heart to the legs or arms. Allow one hour for a Lower Venous exam. Allow thirty minutes for an Upper Venous exam. There are no restrictions or special instructions.   Please schedule ASAP   Follow-Up: At [facility], you and your health needs are our priority.  As part of our continuing mission to provide you with exceptional heart care, we have created designated Provider Care Teams.  These Care Teams include your primary Cardiologist (physician) and Advanced Practice Providers (APPs -  Physician Assistants and Nurse Practitioners) who all work together to provide you with the care you need, when you need it.    Your next appointment:   6 month(s) 12 month(s)  The format for your next appointment:   In Person In Person  Provider:   Coletta Memos, FNP Quay Burow, MD  Other Instructions

## 2019-12-27 NOTE — Assessment & Plan Note (Signed)
History of hyperlipidemia on statin therapy followed by her PCP. 

## 2019-12-27 NOTE — Assessment & Plan Note (Signed)
History of dyspnea on exertion with 2D echo performed 12/30/2018 revealing normal LV systolic function with normal valvular function and a Myoview stress test that was low risk and nonischemic.

## 2019-12-27 NOTE — Assessment & Plan Note (Signed)
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  Will increase to 25 mg a day.  She does complain of palpitations which has been long lasting.

## 2019-12-27 NOTE — Progress Notes (Signed)
12/27/2019 AILANIE Hendricks   1940-10-10  [id number]  Primary Physician Corrington, Delsa Grana, MD Primary Cardiologist: Lorretta Harp MD Garret Reddish, [place], [place]  HPI:  Leslie Hendricks is a 79 y.o.  mildly overweight, widowed Caucasian female with no children who lives alone. Her husband of 2 years died on 05/01/09. She has retired from the [employer]. I last saw her in the office  12/22/2018. Her risk factors include 45 pack-year history of tobacco abuse, having quit 12 years ago; treated hypertension; diabetes; and hyperlipidemia. She does have a strong family history of heart disease. Both parents had bypass surgery. Three of her siblings have myocardial infractions. She had a negative pharmacologic stress test on July 03, 2010. She denies chest pain or shortness of breath. Her major complaints are of lack of stability on her legs, but she really denies claudication. She walks with the aid of a walker. She had arterial Dopplers performed in our office in March of 2012 revealing ABI of 0.98 on the right and 0.77 on the left. She has a very limited functional existence from bed to chair. She rarely gets out of the house. Since I saw her back over one year ago she's had no cardiovascular complaints.she was evaluated by Dr. Servando Snare because of the lung nodule that was not metabolic and therefore the decision was made to follow this conservatively. She apparently needs a knee replacement and was referred back here for cardiovascular clearance.  She had her left total knee replacement 2016.  I saw her over a year ago she now walks with a walker but still is has limited mobility. She gets occasional atypical chest pain and has complained of some palpitations since her rotator cuff surgery in October of last year. I performed a 30 day event monitor which showed only unifocal PVCs.  She had been complaining of increasing dyspnea on exertion.  She was placed on a diuretic because of lower extreme  edema by her PCP.  Her last Myoview performed 07/03/2010 was nonischemic and 2D echo performed 2016 showed normal LV function.  We did perform 2D echocardiography September 2020 as well as a Myoview stress test all of which were unremarkable.  Since I saw her a year ago she is remained stable.  Her major complaint is some pain in her left ankle and some swelling there.  She has had bilateral lower extremity swelling on diuretics presumably related to diastolic dysfunction.  Current Meds  Medication Sig  . Ascorbic Acid (VITAMIN C-ROSE HIPS CR) 1000 MG TBCR Take 1 tablet by mouth daily.  Marland Kitchen aspirin EC 81 MG EC tablet Take 1 tablet (81 mg total) by mouth daily.  Marland Kitchen atorvastatin (LIPITOR) 80 MG tablet Take 80 mg by mouth at bedtime.   . Calcium Carb-Cholecalciferol (CALCIUM 600/VITAMIN D3) 600-800 MG-UNIT TABS Take 1 tablet by mouth daily.  . Cholecalciferol (VITAMIN D-3) 1000 UNITS CAPS Take 1 capsule by mouth daily.  . folic acid (FOLVITE) 564 MCG tablet Take 400 mcg by mouth daily.  . furosemide (LASIX) 20 MG tablet Take 40 mg by mouth 3 (three) times daily.   Marland Kitchen HYDROcodone-acetaminophen (NORCO/VICODIN) 5-325 MG tablet Take 1 tablet by mouth daily.  Marland Kitchen losartan (COZAAR) 25 MG tablet Take 1 tablet by mouth daily.  . Magnesium 250 MG TABS Take 1 tablet by mouth daily.  . metoprolol succinate (TOPROL XL) 25 MG 24 hr tablet Take 0.5 tablets (12.5 mg total) by mouth daily. Can take a  extra half a tablet for palpitations  . Multiple Vitamins-Minerals (VISION FORMULA/LUTEIN PO) Take 1 capsule by mouth daily.  Marland Kitchen omega-3 fish oil (MAXEPA) 1000 MG CAPS capsule Take 1 capsule by mouth daily.  Vladimir Faster Glycol-Propyl Glycol (SYSTANE OP) Place 1 drop into both eyes 2 (two) times daily.   . solifenacin (VESICARE) 5 MG tablet Take 5 mg by mouth daily.  . traZODone (DESYREL) 100 MG tablet Take 100 mg by mouth at bedtime.  Marland Kitchen venlafaxine (EFFEXOR-XR) 75 MG 24 hr capsule Take 75 mg by mouth daily with breakfast.     . vitamin E 400 UNIT capsule Take 400 Units by mouth daily.  Marland Kitchen zinc gluconate 50 MG tablet Take 50 mg by mouth daily.     Allergies  Allergen Reactions  . Banana Nausea And Vomiting  . Codeine Nausea And Vomiting  . Dilaudid [Hydromorphone] Other (See Comments)    Altered mental status  . Oxycodone Hcl Nausea And Vomiting  . Sausage [Pickled Meat] Nausea And Vomiting    Social History   Socioeconomic History  . Marital status: Widowed    Spouse name: Not on file  . Number of children: Not on file  . Years of education: Not on file  . Highest education level: Not on file  Occupational History  . Not on file  Tobacco Use  . Smoking status: Former Smoker    Packs/day: 1.00    Years: 30.00    Pack years: 30.00    Types: Cigarettes    Quit date: 04/15/1998    Years since quitting: 21.7  . Smokeless tobacco: Never Used  Vaping Use  . Vaping Use: Never used  Substance and Sexual Activity  . Alcohol use: No  . Drug use: No  . Sexual activity: Never    Birth control/protection: Surgical  Other Topics Concern  . Not on file  Social History Narrative  . Not on file   Social Determinants of Health   Financial Resource Strain:   . Difficulty of Paying Living Expenses: Not on file  Food Insecurity:   . Worried About Charity fundraiser in the Last Year: Not on file  . Ran Out of Food in the Last Year: Not on file  Transportation Needs:   . Lack of Transportation (Medical): Not on file  . Lack of Transportation (Non-Medical): Not on file  Physical Activity:   . Days of Exercise per Week: Not on file  . Minutes of Exercise per Session: Not on file  Stress:   . Feeling of Stress : Not on file  Social Connections:   . Frequency of Communication with Friends and Family: Not on file  . Frequency of Social Gatherings with Friends and Family: Not on file  . Attends Religious Services: Not on file  . Active Member of Clubs or Organizations: Not on file  . Attends Theatre manager Meetings: Not on file  . Marital Status: Not on file  Intimate Partner Violence:   . Fear of Current or Ex-Partner: Not on file  . Emotionally Abused: Not on file  . Physically Abused: Not on file  . Sexually Abused: Not on file     Review of Systems: General: negative for chills, fever, night sweats or weight changes.  Cardiovascular: negative for chest pain, dyspnea on exertion, edema, orthopnea, palpitations, paroxysmal nocturnal dyspnea or shortness of breath Dermatological: negative for rash Respiratory: negative for cough or wheezing Urologic: negative for hematuria Abdominal: negative for nausea, vomiting, diarrhea,  bright red blood per rectum, melena, or hematemesis Neurologic: negative for visual changes, syncope, or dizziness All other systems reviewed and are otherwise negative except as noted above.    Blood pressure (!) 148/76, pulse 76, height 5\' 4"  (1.626 m), weight 227 lb (103 kg).  General appearance: alert and no distress Neck: no adenopathy, no carotid bruit, no JVD, supple, symmetrical, trachea midline and thyroid not enlarged, symmetric, no tenderness/mass/nodules Lungs: clear to auscultation bilaterally Heart: regular rate and rhythm, S1, S2 normal, no murmur, click, rub or gallop Extremities: 1-2+ pitting edema left greater than right Pulses: 2+ and symmetric Skin: Skin color, texture, turgor normal. No rashes or lesions Neurologic: Alert and oriented X 3, normal strength and tone. Normal symmetric reflexes. Normal coordination and gait  EKG sinus rhythm at 76 with frequent PVCs and nonspecific ST and T wave changes.  Personally reviewed this EKG.  ASSESSMENT AND PLAN:   HYPERLIPIDEMIA History of hyperlipidemia on statin therapy followed by her PCP  Essential hypertension History of essential hypertension a blood pressure measured today at 148/76.  She is on low-dose metoprolol.  PALPITATIONS History of palpitations found to be PVCs on  monitoring.  She is on low-dose beta-blocker.  I am going to increase this from 12.5 to 25 mg of Toprol-XL a day.  PVC's (premature ventricular contractions) History of PVCs on Toprol-XL 12.5 mg a day.  Will increase to 25 mg a day.  She does complain of palpitations which has been long lasting.  Bilateral lower extremity edema History of bilateral lower extremity edema probably related to diastolic dysfunction on diuretics.  Dyspnea on exertion History of dyspnea on exertion with 2D echo performed 12/30/2018 revealing normal LV systolic function with normal valvular function and a Myoview stress test that was low risk and nonischemic.      Lorretta Harp MD FACP,FACC,FAHA, [facility] 12/27/2019 2:27 PM

## 2019-12-27 NOTE — Assessment & Plan Note (Signed)
History of palpitations found to be PVCs on monitoring.  She is on low-dose beta-blocker.  I am going to increase this from 12.5 to 25 mg of Toprol-XL a day.

## 2019-12-27 NOTE — Assessment & Plan Note (Signed)
History of essential hypertension a blood pressure measured today at 148/76.  She is on low-dose metoprolol.

## 2019-12-27 NOTE — Assessment & Plan Note (Signed)
History of bilateral lower extremity edema probably related to diastolic dysfunction on diuretics.

## 2019-12-30 ENCOUNTER — Other Ambulatory Visit: Payer: Self-pay

## 2019-12-30 ENCOUNTER — Ambulatory Visit (HOSPITAL_COMMUNITY)
Admission: RE | Admit: 2019-12-30 | Discharge: 2019-12-30 | Disposition: A | Discharge status: Home / Self Care | Payer: Medicare Other | Source: Ambulatory Visit | Attending: Internal Medicine | Admitting: Internal Medicine

## 2019-12-30 DIAGNOSIS — R600 Localized edema: Secondary | ICD-10-CM | POA: Insufficient documentation

## 2020-02-02 ENCOUNTER — Ambulatory Visit (INDEPENDENT_AMBULATORY_CARE_PROVIDER_SITE_OTHER): Payer: Medicare Other | Admitting: Ophthalmology

## 2020-02-02 ENCOUNTER — Other Ambulatory Visit: Payer: Self-pay

## 2020-02-02 ENCOUNTER — Encounter (INDEPENDENT_AMBULATORY_CARE_PROVIDER_SITE_OTHER): Payer: Self-pay | Admitting: Ophthalmology

## 2020-02-02 DIAGNOSIS — H3562 Retinal hemorrhage, left eye: Secondary | ICD-10-CM

## 2020-02-02 DIAGNOSIS — H353221 Exudative age-related macular degeneration, left eye, with active choroidal neovascularization: Secondary | ICD-10-CM | POA: Diagnosis not present

## 2020-02-02 MED ORDER — AFLIBERCEPT 2MG/0.05ML IZ SOLN FOR KALEIDOSCOPE
2.0000 mg | INTRAVITREAL | Status: AC | PRN
  Administered 2020-02-02: 2 mg via INTRAVITREAL

## 2020-02-02 NOTE — Assessment & Plan Note (Signed)
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
## 2020-02-02 NOTE — Patient Instructions (Signed)
Patient instructed to contact the office for new onset visual acuity distortions or declines.

## 2020-02-29 ENCOUNTER — Other Ambulatory Visit: Payer: Self-pay

## 2020-02-29 MED ORDER — ASPIRIN 81 MG PO TBEC: 81.0000 mg | DELAYED_RELEASE_TABLET | Freq: Every day | ORAL | 3 refills | Status: DC

## 2020-03-03 DIAGNOSIS — M47816 Spondylosis without myelopathy or radiculopathy, lumbar region: Secondary | ICD-10-CM | POA: Insufficient documentation

## 2020-03-06 ENCOUNTER — Telehealth: Payer: Medicare Other | Admitting: General Practice
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
## 2020-03-08 ENCOUNTER — Other Ambulatory Visit: Payer: Self-pay | Admitting: Cardiovascular Disease
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
## 2020-03-14 ENCOUNTER — Telehealth (INDEPENDENT_AMBULATORY_CARE_PROVIDER_SITE_OTHER): Payer: Medicare Other | Admitting: General Practice

## 2020-03-14 ENCOUNTER — Encounter: Payer: Self-pay | Admitting: General Practice

## 2020-03-14 VITALS — BP 164/60 | HR 44 | Wt 227.0 lb

## 2020-03-14 DIAGNOSIS — I10 Essential (primary) hypertension: Secondary | ICD-10-CM

## 2020-03-14 DIAGNOSIS — E7800 Pure hypercholesterolemia, unspecified: Secondary | ICD-10-CM

## 2020-03-14 DIAGNOSIS — R002 Palpitations: Secondary | ICD-10-CM | POA: Diagnosis not present

## 2020-03-14 DIAGNOSIS — R600 Localized edema: Secondary | ICD-10-CM

## 2020-03-14 DIAGNOSIS — I493 Ventricular premature depolarization: Secondary | ICD-10-CM

## 2020-03-14 MED ORDER — AMLODIPINE BESYLATE 2.5 MG PO TABS: 2.5000 mg | ORAL_TABLET | Freq: Every day | ORAL | 3 refills | Status: DC

## 2020-03-14 NOTE — Patient Instructions (Addendum)
Medication Instructions:  START AMLODIPINE 2.5MG  DAIILY  *If you need a refill on your cardiac medications before your next appointment, please call your pharmacy*  Lab Work:   Testing/Procedures:  NONE    NONE  Special Instructions TAKE AND LOG YOUR BLOOD PRESSURE DAILY  PLEASE READ AND FOLLOW SALTY 6-ATTACHED-1,800mg  daily  PLEASE INCREASE PHYSICAL ACTIVITY AS TOLERATED  Please try to avoid these triggers:  Do not use any products that have nicotine or tobacco in them. These include cigarettes, e-cigarettes, and chewing tobacco. If you need help quitting, ask your doctor.  Eat heart-healthy foods. Talk with your doctor about the right eating plan for you.  Exercise regularly as told by your doctor.  Do not drink alcohol, Caffeine or chocolate.  Lose weight if you are overweight.  Do not use drugs, including cannabis   Follow-Up: Your next appointment:  1 month(s) Virtual Visit  with Quay Burow, MD OR IF UNAVAILABLE Coletta Memos, FNP-C=PHONE VISIT 04-19-2020 @ 945AM  At [facility], you and your health needs are our priority.  As part of our continuing mission to provide you with exceptional heart care, we have created designated Provider Care Teams.  These Care Teams include your primary Cardiologist (physician) and Advanced Practice Providers (APPs -  Physician Assistants and Nurse Practitioners) who all work together to provide you with the care you need, when you need it.            6 SALTY THINGS TO AVOID     1,800MG  DAILY

## 2020-03-15 ENCOUNTER — Other Ambulatory Visit: Payer: Self-pay

## 2020-03-15 ENCOUNTER — Ambulatory Visit (INDEPENDENT_AMBULATORY_CARE_PROVIDER_SITE_OTHER): Payer: Medicare Other | Admitting: Ophthalmology

## 2020-03-15 ENCOUNTER — Encounter (INDEPENDENT_AMBULATORY_CARE_PROVIDER_SITE_OTHER): Payer: Self-pay | Admitting: Ophthalmology

## 2020-03-15 DIAGNOSIS — E119 Type 2 diabetes mellitus without complications: Secondary | ICD-10-CM | POA: Diagnosis not present

## 2020-03-15 DIAGNOSIS — H35722 Serous detachment of retinal pigment epithelium, left eye: Secondary | ICD-10-CM

## 2020-03-15 DIAGNOSIS — H353221 Exudative age-related macular degeneration, left eye, with active choroidal neovascularization: Secondary | ICD-10-CM

## 2020-03-15 DIAGNOSIS — H3562 Retinal hemorrhage, left eye: Secondary | ICD-10-CM | POA: Diagnosis not present

## 2020-03-15 MED ORDER — AFLIBERCEPT 2MG/0.05ML IZ SOLN FOR KALEIDOSCOPE
2.0000 mg | INTRAVITREAL | Status: AC | PRN
  Administered 2020-03-15: 2 mg via INTRAVITREAL
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
## 2020-03-15 NOTE — Assessment & Plan Note (Signed)
OS improved, post intravitreal Eylea some 6 weeks previous,, hemorrhagic pigment epithelial detachment detected last visit, has stabilized, much less subretinal fluid on OCT today.  We will repeat injection today and examination again in 6 weeks

## 2020-03-15 NOTE — Assessment & Plan Note (Signed)

## 2020-03-15 NOTE — Assessment & Plan Note (Signed)
OS smaller as compared to prior examination 6 weeks ago
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
## 2020-04-19 ENCOUNTER — Telehealth (INDEPENDENT_AMBULATORY_CARE_PROVIDER_SITE_OTHER): Payer: Medicare Other | Admitting: General Practice

## 2020-04-19 ENCOUNTER — Encounter: Payer: Self-pay | Admitting: General Practice

## 2020-04-19 VITALS — BP 144/52 | HR 54 | Wt 227.0 lb

## 2020-04-19 DIAGNOSIS — E7800 Pure hypercholesterolemia, unspecified: Secondary | ICD-10-CM

## 2020-04-19 DIAGNOSIS — R600 Localized edema: Secondary | ICD-10-CM

## 2020-04-19 DIAGNOSIS — I10 Essential (primary) hypertension: Secondary | ICD-10-CM | POA: Diagnosis not present

## 2020-04-19 DIAGNOSIS — I493 Ventricular premature depolarization: Secondary | ICD-10-CM

## 2020-04-19 DIAGNOSIS — R002 Palpitations: Secondary | ICD-10-CM

## 2020-04-19 NOTE — Patient Instructions (Signed)
Medication Instructions:  OK TO TAKE MUCINEX OTC-NOT THE ONE WITH THE D AFTER *If you need a refill on your cardiac medications before your next appointment, please call your pharmacy*  Lab Work:   Testing/Procedures:  NONE    NONE  Special Instructions  PLEASE READ AND FOLLOW SALTY 6-ATTACHED-1,800mg  daily  PLEASE INCREASE PHYSICAL ACTIVITY AS TOLERATED  PLEASE PURCHASE AND WEAR COMPRESSION STOCKINGS DAILY AND OFF AT BEDTIME. Compression stockings are elastic socks that squeeze the legs. They help to increase blood flow to the legs and to decrease swelling in the legs from fluid retention, and reduce the chance of developing blood clots in the lower legs. Please put on in the AM when dressing and off at night when dressing for bed. Marland Kitchen  [facility]  [facility]  [address] ([phone]); [place], [place] [address]; [phone]  EMAIL   [email or web address] PLEASE MAKE SURE TO ELEVATE YOUR FEET & LEGS WHILE SITTING, THIS WILL HELP WITH THE SWELLING ALSO  TAKE AND LOG YOU BLOOD PRESSURE AND BRING LOG TO YOUR FOLLOW UP APPOINTMENT FOR MD REVIEW  Follow-Up: Your next appointment:  3-4 month(s) In Person with Nanetta Batty, MD OR IF UNAVAILABLE JESSE CLEAVER, FNP-C   At [facility], you and your health needs are our priority.  As part of our continuing mission to provide you with exceptional heart care, we have created designated Provider Care Teams.  These Care Teams include your primary Cardiologist (physician) and Advanced Practice Providers (APPs -  Physician Assistants and Nurse Practitioners) who all work together to provide you with the care you need, when you need it.            6 SALTY THINGS TO AVOID     1,800MG  DAILY

## 2020-04-26 ENCOUNTER — Other Ambulatory Visit: Payer: Self-pay

## 2020-04-26 ENCOUNTER — Encounter (INDEPENDENT_AMBULATORY_CARE_PROVIDER_SITE_OTHER): Payer: Self-pay | Admitting: Ophthalmology

## 2020-04-26 ENCOUNTER — Ambulatory Visit (INDEPENDENT_AMBULATORY_CARE_PROVIDER_SITE_OTHER): Payer: Medicare Other | Admitting: Ophthalmology

## 2020-04-26 DIAGNOSIS — H353221 Exudative age-related macular degeneration, left eye, with active choroidal neovascularization: Secondary | ICD-10-CM

## 2020-04-26 MED ORDER — AFLIBERCEPT 2MG/0.05ML IZ SOLN FOR KALEIDOSCOPE
2.0000 mg | INTRAVITREAL | Status: AC | PRN
  Administered 2020-04-26: 2 mg via INTRAVITREAL

## 2020-04-26 NOTE — Assessment & Plan Note (Signed)
CNVM with subretinal hemorrhage adjacent.  Stable over time on intravitreal Eylea currently.  Well not progression, would consider treatment with Avastin to see if the resistance has developed to the current medication
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
## 2020-06-07 ENCOUNTER — Ambulatory Visit (INDEPENDENT_AMBULATORY_CARE_PROVIDER_SITE_OTHER): Payer: Medicare Other | Admitting: Ophthalmology

## 2020-06-07 ENCOUNTER — Encounter (INDEPENDENT_AMBULATORY_CARE_PROVIDER_SITE_OTHER): Payer: Self-pay | Admitting: Ophthalmology

## 2020-06-07 ENCOUNTER — Other Ambulatory Visit: Payer: Self-pay

## 2020-06-07 DIAGNOSIS — H353221 Exudative age-related macular degeneration, left eye, with active choroidal neovascularization: Secondary | ICD-10-CM

## 2020-06-07 DIAGNOSIS — H353212 Exudative age-related macular degeneration, right eye, with inactive choroidal neovascularization: Secondary | ICD-10-CM

## 2020-06-07 MED ORDER — AFLIBERCEPT 2MG/0.05ML IZ SOLN FOR KALEIDOSCOPE
2.0000 mg | INTRAVITREAL | Status: AC | PRN
  Administered 2020-06-07: 2 mg via INTRAVITREAL

## 2020-06-07 NOTE — Assessment & Plan Note (Signed)
No signs of recurrence by OCT evaluation OD vision limited by massive disciform scar OD

## 2020-06-07 NOTE — Assessment & Plan Note (Signed)
OS, with much less subretinal hemorrhage now in the foveal region.  Post Eylea injection, 6 weeks previous we will repeat injection today and examination again in 6 weeks
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
## 2020-06-13 ENCOUNTER — Ambulatory Visit: Payer: Medicare Other | Admitting: Podiatry

## 2020-06-15 ENCOUNTER — Other Ambulatory Visit: Payer: Self-pay

## 2020-06-15 ENCOUNTER — Ambulatory Visit (INDEPENDENT_AMBULATORY_CARE_PROVIDER_SITE_OTHER): Payer: Medicare Other

## 2020-06-15 ENCOUNTER — Ambulatory Visit (INDEPENDENT_AMBULATORY_CARE_PROVIDER_SITE_OTHER): Payer: Medicare Other | Admitting: Podiatry

## 2020-06-15 ENCOUNTER — Other Ambulatory Visit: Payer: Self-pay | Admitting: Podiatry

## 2020-06-15 DIAGNOSIS — M19072 Primary osteoarthritis, left ankle and foot: Secondary | ICD-10-CM | POA: Diagnosis not present

## 2020-06-15 DIAGNOSIS — M722 Plantar fascial fibromatosis: Secondary | ICD-10-CM

## 2020-06-15 DIAGNOSIS — B351 Tinea unguium: Secondary | ICD-10-CM

## 2020-06-15 DIAGNOSIS — M79676 Pain in unspecified toe(s): Secondary | ICD-10-CM

## 2020-06-15 MED ORDER — TRIAMCINOLONE ACETONIDE 40 MG/ML IJ SUSP
20.0000 mg | Freq: Once | INTRAMUSCULAR | Status: AC
  Administered 2020-06-15: 20 mg

## 2020-06-15 NOTE — Progress Notes (Signed)
She presents today with her sister with a chief concern of pain to the bottom of her left heel times about 1 month.  She denies any injury to it.  She states that is worse with walking and there has not been a whole lot of swelling.  Objective: Vital signs are stable alert and oriented x3.  Pulses are palpable.  There is no erythema edema cellulitis drainage or odor she does have pain on palpation to the medial calcaneal tubercle and the medial lateral compression of the calcaneus.  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  Radiographs taken today demonstrate soft tissue increase in density plantar fascial kidney insertion site of the left heel with a small fracture fragment or calcification within the tendon.  I will see any other signs of acute trauma.  Assessment: [place] fasciitis painful plantar fasciitis and porokeratosis  Plan: Discussed etiology pathology conservative versus surgical therapies at this point I debrided nails 1 through 5 bilaterally and injected the plantar fascial cannula insertion site with 20 mg Kenalog 5 mg Marcaine.  Also debrided the reactive hyperkeratoses for her.

## 2020-06-30 ENCOUNTER — Other Ambulatory Visit: Payer: Self-pay | Admitting: General Practice

## 2020-07-19 ENCOUNTER — Other Ambulatory Visit: Payer: Self-pay

## 2020-07-19 ENCOUNTER — Encounter (INDEPENDENT_AMBULATORY_CARE_PROVIDER_SITE_OTHER): Payer: Self-pay | Admitting: Ophthalmology

## 2020-07-19 ENCOUNTER — Ambulatory Visit (INDEPENDENT_AMBULATORY_CARE_PROVIDER_SITE_OTHER): Payer: Medicare Other | Admitting: Ophthalmology

## 2020-07-19 DIAGNOSIS — H353221 Exudative age-related macular degeneration, left eye, with active choroidal neovascularization: Secondary | ICD-10-CM | POA: Diagnosis not present

## 2020-07-19 MED ORDER — AFLIBERCEPT 2MG/0.05ML IZ SOLN FOR KALEIDOSCOPE
2.0000 mg | INTRAVITREAL | Status: AC | PRN
  Administered 2020-07-19: 2 mg via INTRAVITREAL

## 2020-07-19 NOTE — Assessment & Plan Note (Signed)
Active disease which reactivated 69-month follow-up in the past with hemorrhage temporal to the macula extending into the fovea in the fall 2021.  Now this hemorrhage has resolved and patient's pinhole visual acuity is 20/50 as compared to her current glasses of 20/200  I explained to the patient that hemorrhage in the center of the vision would permanently blocking the vision and were not able to see 20/50.  I asked her to continue to seek assistance with potentially refractive ranges in the left eye to maximize her potential acuity
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
## 2020-08-08 ENCOUNTER — Other Ambulatory Visit: Payer: Self-pay

## 2020-08-08 ENCOUNTER — Encounter: Payer: Self-pay | Admitting: Cardiovascular Disease

## 2020-08-08 ENCOUNTER — Ambulatory Visit (INDEPENDENT_AMBULATORY_CARE_PROVIDER_SITE_OTHER): Payer: Medicare Other | Admitting: Cardiovascular Disease

## 2020-08-08 DIAGNOSIS — R600 Localized edema: Secondary | ICD-10-CM

## 2020-08-08 DIAGNOSIS — I10 Essential (primary) hypertension: Secondary | ICD-10-CM | POA: Diagnosis not present

## 2020-08-08 MED ORDER — FUROSEMIDE 40 MG PO TABS: 40.0000 mg | ORAL_TABLET | Freq: Two times a day (BID) | ORAL | 1 refills | Status: DC

## 2020-08-08 NOTE — Assessment & Plan Note (Signed)
History of essential hypertension a blood pressure measured today 130/49.  She is on low-dose amlodipine, metoprolol and losartan.

## 2020-08-08 NOTE — Progress Notes (Signed)
08/08/2020 DRUANNE BOSQUES   1940/07/21  [id number]  Primary Physician Corrington, Delsa Grana, MD Primary Cardiologist: Lorretta Harp MD FACP, [place], [place], [place]  HPI:  Leslie Hendricks is a 80 y.o.  mildly overweight, widowed Caucasian female with no children who lives alone. Her husband of 68 years died on 2009-03-18. She has retired from the [employer]. I last saw her in the office  12/27/2019. Her risk factors include 45 pack-year history of tobacco abuse, having quit 12 years ago; treated hypertension; diabetes; and hyperlipidemia. She does have a strong family history of heart disease. Both parents had bypass surgery. Three of her siblings have myocardial infractions. She had a negative pharmacologic stress test on July 03, 2010. She denies chest pain or shortness of breath. Her major complaints are of lack of stability on her legs, but she really denies claudication. She walks with the aid of a walker. She had arterial Dopplers performed in our office in March of 2012 revealing ABI of 0.98 on the right and 0.77 on the left. She has a very limited functional existence from bed to chair. She rarely gets out of the house. Since I saw her back over one year ago she's had no cardiovascular complaints.she was evaluated by Dr. Servando Snare because of the lung nodule that was not metabolic and therefore the decision was made to follow this conservatively. She apparently needs a knee replacement and was referred back here for cardiovascular clearance.  She had her left total knee replacement 2016.  I saw her over a year ago she now walks with a walker but still is has limited mobility. She gets occasional atypical chest pain and has complained of some palpitations since her rotator cuff surgery in October of last year. I performed a 30 day event monitor which showed only unifocal PVCs.  She had been complaining of increasing dyspnea on exertion. She was placed on a diuretic because of lower  extreme edema by her PCP. Her last Myoview performed 07/03/2010 was nonischemic and 2D echo performed 2016 showed normal LV function.  We did perform 2D echocardiography September 2020 as well as a Myoview stress test all of which were unremarkable.  Since I saw her 7 months ago her major complaint continues to be lower extremity edema.  She is on furosemide 20 mg 3 times daily and continues to have 2+ pitting edema.  She again is minimally ambulatory.  This was presumably related to diastolic dysfunction.   Current Meds  Medication Sig  . amLODipine (NORVASC) 2.5 MG tablet Take 1 tablet by mouth once daily  . Ascorbic Acid (VITAMIN C-ROSE HIPS CR) 1000 MG TBCR Take 1 tablet by mouth daily.  Marland Kitchen aspirin 81 MG EC tablet Take 1 tablet by mouth daily  . atorvastatin (LIPITOR) 80 MG tablet Take 80 mg by mouth at bedtime.  . Calcium Carb-Cholecalciferol 600-800 MG-UNIT TABS Take 1 tablet by mouth daily.  . Cholecalciferol (VITAMIN D-3) 1000 UNITS CAPS Take 1 capsule by mouth daily.  . folic acid (FOLVITE) 782 MCG tablet Take 400 mcg by mouth daily.  . furosemide (LASIX) 20 MG tablet Take 20 mg by mouth 3 (three) times daily.   Marland Kitchen HYDROcodone-acetaminophen (NORCO/VICODIN) 5-325 MG tablet Take 1 tablet by mouth daily.  Marland Kitchen losartan (COZAAR) 25 MG tablet Take 1 tablet by mouth daily.  . Magnesium 250 MG TABS Take 1 tablet by mouth daily.  . metoprolol succinate (TOPROL XL) 25 MG 24 hr tablet Take  1 tablet (25 mg total) by mouth daily. Can take a extra half a tablet for palpitations  . Multiple Vitamins-Minerals (VISION FORMULA/LUTEIN PO) Take 1 capsule by mouth daily.  Marland Kitchen omega-3 fish oil (MAXEPA) 1000 MG CAPS capsule Take 1 capsule by mouth daily.  Vladimir Faster Glycol-Propyl Glycol (SYSTANE OP) Place 1 drop into both eyes 2 (two) times daily.   . solifenacin (VESICARE) 5 MG tablet Take 5 mg by mouth daily.  . traZODone (DESYREL) 100 MG tablet Take 100 mg by mouth at bedtime.  Marland Kitchen venlafaxine (EFFEXOR-XR) 75  MG 24 hr capsule Take 75 mg by mouth daily with breakfast.  . vitamin E 400 UNIT capsule Take 400 Units by mouth daily.  Marland Kitchen zinc gluconate 50 MG tablet Take 50 mg by mouth daily.     Allergies  Allergen Reactions  . Banana Nausea And Vomiting  . Codeine Nausea And Vomiting  . Dilaudid [Hydromorphone] Other (See Comments)    Altered mental status  . Other Other (See Comments)  . Oxycodone Hcl Nausea And Vomiting  . Sausage [Pickled Meat] Nausea And Vomiting  . Tobramycin     Social History   Socioeconomic History  . Marital status: Widowed    Spouse name: Not on file  . Number of children: Not on file  . Years of education: Not on file  . Highest education level: Not on file  Occupational History  . Not on file  Tobacco Use  . Smoking status: Former Smoker    Packs/day: 1.00    Years: 30.00    Pack years: 30.00    Types: Cigarettes    Quit date: 04/15/1998    Years since quitting: 22.3  . Smokeless tobacco: Never Used  Vaping Use  . Vaping Use: Never used  Substance and Sexual Activity  . Alcohol use: No  . Drug use: No  . Sexual activity: Never    Birth control/protection: Surgical  Other Topics Concern  . Not on file  Social History Narrative  . Not on file   Social Determinants of Health   Financial Resource Strain: Not on file  Food Insecurity: Not on file  Transportation Needs: Not on file  Physical Activity: Not on file  Stress: Not on file  Social Connections: Not on file  Intimate Partner Violence: Not on file     Review of Systems: General: negative for chills, fever, night sweats or weight changes.  Cardiovascular: negative for chest pain, dyspnea on exertion, edema, orthopnea, palpitations, paroxysmal nocturnal dyspnea or shortness of breath Dermatological: negative for rash Respiratory: negative for cough or wheezing Urologic: negative for hematuria Abdominal: negative for nausea, vomiting, diarrhea, bright red blood per rectum, melena, or  hematemesis Neurologic: negative for visual changes, syncope, or dizziness All other systems reviewed and are otherwise negative except as noted above.    Blood pressure (!) 130/49, pulse (!) 56, height 5\' 5"  (1.651 m), weight 230 lb 9.6 oz (104.6 kg), SpO2 98 %.  General appearance: alert and no distress Neck: no adenopathy, no carotid bruit, no JVD, supple, symmetrical, trachea midline and thyroid not enlarged, symmetric, no tenderness/mass/nodules Lungs: clear to auscultation bilaterally Heart: regular rate and rhythm, S1, S2 normal, no murmur, click, rub or gallop Extremities: 2+ pitting edema bilaterally Pulses: 2+ and symmetric Skin: Skin color, texture, turgor normal. No rashes or lesions Neurologic: Alert and oriented X 3, normal strength and tone. Normal symmetric reflexes. Normal coordination and gait  EKG sinus bradycardia 56 without ST or T wave  changes.  I personally reviewed this EKG.  ASSESSMENT AND PLAN:   HYPERLIPIDEMIA History of hyperlipidemia on statin therapy with lipid profile followed by her PCP  Essential hypertension History of essential hypertension a blood pressure measured today 130/49.  She is on low-dose amlodipine, metoprolol and losartan.  Bilateral lower extremity edema History of moderate bilateral lower extremity edema probably related to diastolic dysfunction on Lasix 3 times daily.  I am going to increase her Lasix to 40 mg p.o. twice daily, check a basic metabolic panel in 2 weeks and have her see Coletta Memos, NP back in a month to further titrate.      Lorretta Harp MD FACP,FACC,FAHA, [facility] 08/08/2020 11:43 AM

## 2020-08-08 NOTE — Assessment & Plan Note (Signed)
History of moderate bilateral lower extremity edema probably related to diastolic dysfunction on Lasix 3 times daily.  I am going to increase her Lasix to 40 mg p.o. twice daily, check a basic metabolic panel in 2 weeks and have her see Coletta Memos, NP back in a month to further titrate.

## 2020-08-08 NOTE — Patient Instructions (Signed)
Medication Instructions:   -Take furosemide (lasix) 40mg  twice daily.  *If you need a refill on your cardiac medications before your next appointment, please call your pharmacy*   Lab Work: Your physician recommends that you return for lab work in: 2 weeks for BMET  If you have labs (blood work) drawn today and your tests are completely normal, you will receive your results only by: Marland Kitchen MyChart Message (if you have MyChart) OR . A paper copy in the mail If you have any lab test that is abnormal or we need to change your treatment, we will call you to review the results.   Follow-Up: At [facility], you and your health needs are our priority.  As part of our continuing mission to provide you with exceptional heart care, we have created designated Provider Care Teams.  These Care Teams include your primary Cardiologist (physician) and Advanced Practice Providers (APPs -  Physician Assistants and Nurse Practitioners) who all work together to provide you with the care you need, when you need it.  We recommend signing up for the patient portal called "MyChart".  Sign up information is provided on this After Visit Summary.  MyChart is used to connect with patients for Virtual Visits (Telemedicine).  Patients are able to view lab/test results, encounter notes, upcoming appointments, etc.  Non-urgent messages can be sent to your provider as well.   To learn more about what you can do with MyChart, go to [email or web address].    Your next appointment:   1 month(s)  The format for your next appointment:   In Person  Provider:   You will see one of the following Advanced Practice Providers on your designated Care Team:    Coletta Memos, FNP  Then, Quay Burow, MD will plan to see you again in 12 month(s).

## 2020-08-08 NOTE — Assessment & Plan Note (Signed)
History of hyperlipidemia on statin therapy with lipid profile followed by her PCP 

## 2020-08-18 ENCOUNTER — Other Ambulatory Visit: Payer: Self-pay | Admitting: Cardiovascular Disease

## 2020-08-24 LAB — BASIC METABOLIC PANEL
BUN/Creatinine Ratio: 34 — ABNORMAL HIGH (ref 12–28)
BUN: 42 mg/dL — ABNORMAL HIGH (ref 8–27)
CO2: 28 mmol/L (ref 20–29)
Calcium: 9.4 mg/dL (ref 8.7–10.3)
Chloride: 100 mmol/L (ref 96–106)
Creatinine, Ser: 1.24 mg/dL — ABNORMAL HIGH (ref 0.57–1.00)
Glucose: 97 mg/dL (ref 65–99)
Potassium: 4.4 mmol/L (ref 3.5–5.2)
Sodium: 141 mmol/L (ref 134–144)
eGFR: 43 mL/min/{1.73_m2} — ABNORMAL LOW (ref >59)

## 2020-09-06 ENCOUNTER — Ambulatory Visit (INDEPENDENT_AMBULATORY_CARE_PROVIDER_SITE_OTHER): Payer: Medicare Other | Admitting: Ophthalmology

## 2020-09-06 ENCOUNTER — Other Ambulatory Visit: Payer: Self-pay

## 2020-09-06 ENCOUNTER — Encounter (INDEPENDENT_AMBULATORY_CARE_PROVIDER_SITE_OTHER): Payer: Self-pay | Admitting: Ophthalmology

## 2020-09-06 DIAGNOSIS — H353221 Exudative age-related macular degeneration, left eye, with active choroidal neovascularization: Secondary | ICD-10-CM

## 2020-09-06 DIAGNOSIS — H353212 Exudative age-related macular degeneration, right eye, with inactive choroidal neovascularization: Secondary | ICD-10-CM

## 2020-09-06 MED ORDER — AFLIBERCEPT 2MG/0.05ML IZ SOLN FOR KALEIDOSCOPE
2.0000 mg | INTRAVITREAL | Status: AC | PRN
  Administered 2020-09-06: 2 mg via INTRAVITREAL

## 2020-09-06 NOTE — Patient Instructions (Signed)
Patient instructed to contact the office promptly for new onset visual acuity declines or distortions 
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
## 2020-09-06 NOTE — Assessment & Plan Note (Signed)
Much less subretinal fluid clinically as well as on OCT nonetheless still active serous component to the vascularized PED and CNVM particularly temporal to the fovea at 7-week interval.  Visual acuity has slightly improved.  Repeat injection Eylea OS today and examination next again in 5 weeks

## 2020-09-06 NOTE — Assessment & Plan Note (Signed)
Massive subfoveal disciform scar right eye by OCT no change

## 2020-09-08 ENCOUNTER — Other Ambulatory Visit: Payer: Self-pay | Admitting: Cardiovascular Disease
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
## 2020-09-12 ENCOUNTER — Other Ambulatory Visit: Payer: Self-pay

## 2020-09-12 ENCOUNTER — Encounter: Payer: Self-pay | Admitting: General Practice

## 2020-09-12 ENCOUNTER — Ambulatory Visit (INDEPENDENT_AMBULATORY_CARE_PROVIDER_SITE_OTHER): Payer: Medicare Other | Admitting: General Practice

## 2020-09-12 VITALS — BP 131/56 | HR 63 | Ht 63.0 in | Wt 232.2 lb

## 2020-09-12 DIAGNOSIS — E7800 Pure hypercholesterolemia, unspecified: Secondary | ICD-10-CM

## 2020-09-12 DIAGNOSIS — I493 Ventricular premature depolarization: Secondary | ICD-10-CM

## 2020-09-12 DIAGNOSIS — I10 Essential (primary) hypertension: Secondary | ICD-10-CM | POA: Diagnosis not present

## 2020-09-12 DIAGNOSIS — R600 Localized edema: Secondary | ICD-10-CM

## 2020-09-12 DIAGNOSIS — Z79899 Other long term (current) drug therapy: Secondary | ICD-10-CM

## 2020-09-12 DIAGNOSIS — R002 Palpitations: Secondary | ICD-10-CM

## 2020-09-12 NOTE — Patient Instructions (Signed)
Medication Instructions:  The current medical regimen is effective;  continue present plan and medications as directed. Please refer to the Current Medication list given to you today.  *If you need a refill on your cardiac medications before your next appointment, please call your pharmacy*  Lab Work:   Testing/Procedures:  BMET TODAY   NONE  Special Instructions USE MEDICAL BED TO ELEVATE LEGS  FLUID RESTRICTION <48 OZ DAILY  LOG YOUR WEIGHT DAILY AND CALL IF WEIGHT IS >3lb/DAY OR >5lb/WEEK  PLEASE READ AND FOLLOW SALTY 6-ATTACHED-1,800mg  daily  WE WILL CALL WITH LAB RESULTS  Follow-Up: Your next appointment:  6 month(s) In Person with Quay Burow, MD OR IF UNAVAILABLE JESSE CLEAVER, FNP-C   Please call our office 2 months in advance to schedule this appointment   At [facility], you and your health needs are our priority.  As part of our continuing mission to provide you with exceptional heart care, we have created designated Provider Care Teams.  These Care Teams include your primary Cardiologist (physician) and Advanced Practice Providers (APPs -  Physician Assistants and Nurse Practitioners) who all work together to provide you with the care you need, when you need it.            6 SALTY THINGS TO AVOID     1,800MG  DAILY

## 2020-09-13 LAB — BASIC METABOLIC PANEL
BUN/Creatinine Ratio: 28 (ref 12–28)
BUN: 34 mg/dL — ABNORMAL HIGH (ref 8–27)
CO2: 27 mmol/L (ref 20–29)
Calcium: 9.3 mg/dL (ref 8.7–10.3)
Chloride: 100 mmol/L (ref 96–106)
Creatinine, Ser: 1.23 mg/dL — ABNORMAL HIGH (ref 0.57–1.00)
Glucose: 131 mg/dL — ABNORMAL HIGH (ref 65–99)
Potassium: 4.4 mmol/L (ref 3.5–5.2)
Sodium: 140 mmol/L (ref 134–144)
eGFR: 44 mL/min/{1.73_m2} — ABNORMAL LOW (ref >59)

## 2020-10-05 ENCOUNTER — Ambulatory Visit (INDEPENDENT_AMBULATORY_CARE_PROVIDER_SITE_OTHER): Payer: Medicare Other | Admitting: Podiatry

## 2020-10-05 ENCOUNTER — Other Ambulatory Visit: Payer: Self-pay

## 2020-10-05 ENCOUNTER — Encounter: Payer: Self-pay | Admitting: Podiatry

## 2020-10-05 DIAGNOSIS — D2372 Other benign neoplasm of skin of left lower limb, including hip: Secondary | ICD-10-CM

## 2020-10-05 DIAGNOSIS — M722 Plantar fascial fibromatosis: Secondary | ICD-10-CM | POA: Diagnosis not present

## 2020-10-05 DIAGNOSIS — B351 Tinea unguium: Secondary | ICD-10-CM | POA: Diagnosis not present

## 2020-10-05 DIAGNOSIS — D2371 Other benign neoplasm of skin of right lower limb, including hip: Secondary | ICD-10-CM | POA: Diagnosis not present

## 2020-10-05 DIAGNOSIS — M79676 Pain in unspecified toe(s): Secondary | ICD-10-CM

## 2020-10-05 MED ORDER — TRIAMCINOLONE ACETONIDE 40 MG/ML IJ SUSP
20.0000 mg | Freq: Once | INTRAMUSCULAR | Status: AC
Start: 2020-10-05 — End: 2020-10-05
  Administered 2020-10-05: 20 mg

## 2020-10-08 NOTE — Progress Notes (Signed)
She presents today for follow-up of her painful elongated nails and her painful left heel.  States that her heels continue to bother her particularly the left foot.  Objective: Vital signs are stable alert and x3 pulses are palpable.  She has pain on palpation medial calcaneal tubercle of the left heel.  None on the right heel.  She has thick yellow dystrophic onychomycotic nails painful palpation as well as debridement.  Multiple small reactive hyperkeratotic lesions forefoot.  Assessment: Benign skin lesions painful bilateral.  Painful elongated mycotic nails bilateral.  Also Planter fasciitis.  Plan: Reinjected the plantar fascial today left heel 20 mg Kenalog 5 mg Marcaine point of maximal tenderness.  Tolerated procedure well.  Debrided toenails and debrided benign skin lesions.

## 2020-10-08 NOTE — Patient Instructions (Signed)
And

## 2020-10-11 ENCOUNTER — Other Ambulatory Visit: Payer: Self-pay

## 2020-10-11 ENCOUNTER — Encounter (INDEPENDENT_AMBULATORY_CARE_PROVIDER_SITE_OTHER): Payer: Self-pay | Admitting: Ophthalmology

## 2020-10-11 ENCOUNTER — Ambulatory Visit (INDEPENDENT_AMBULATORY_CARE_PROVIDER_SITE_OTHER): Payer: Medicare Other | Admitting: Ophthalmology

## 2020-10-11 DIAGNOSIS — R0683 Snoring: Secondary | ICD-10-CM

## 2020-10-11 DIAGNOSIS — H353114 Nonexudative age-related macular degeneration, right eye, advanced atrophic with subfoveal involvement: Secondary | ICD-10-CM

## 2020-10-11 DIAGNOSIS — H353221 Exudative age-related macular degeneration, left eye, with active choroidal neovascularization: Secondary | ICD-10-CM | POA: Diagnosis not present

## 2020-10-11 MED ORDER — AFLIBERCEPT 2MG/0.05ML IZ SOLN FOR KALEIDOSCOPE
2.0000 mg | INTRAVITREAL | Status: AC | PRN
  Administered 2020-10-11: 2 mg via INTRAVITREAL

## 2020-10-11 NOTE — Assessment & Plan Note (Signed)
At 5-week interval post Eylea, patient continues to have much less subretinal hemorrhage temporally and superiorly yet New subretinal fluid in the subfoveal location.  I have personal experience with patients who have undiagnosed and untreated sleep apnea that stay resistant to the effects of these medications due to the nightly hypoxia faced by the central nervous system but particularly the macula in this particular condition.  Once sleep apnea is corrected, nightly hypoxic damage and nightly hypertensive damage to the retinal vasculature makes the eye more sensitive to the beneficial effects of antivegF.  For this reason of asked the patient to seek evaluation with her PCP since she is known to snore.  Snoring is not sleep apnea but is a sign and could be a sign that she needs nightly assistance to maximize her oxygenation to the brain and to the eye in this case

## 2020-10-11 NOTE — Assessment & Plan Note (Signed)
No change by OCT, not treatable OD
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
## 2020-10-11 NOTE — Assessment & Plan Note (Signed)
Review of symptoms moderate is significantly positive for possible sleep apnea.  Patient has nocturia which she ascribes to taking fluid pill  Patient witnessed to snore and does occasionally take a nap

## 2020-10-20 ENCOUNTER — Other Ambulatory Visit: Payer: Self-pay | Admitting: Cardiovascular Disease

## 2020-11-03 ENCOUNTER — Institutional Professional Consult (permissible substitution): Payer: Medicare Other | Admitting: Internal Medicine

## 2020-11-15 ENCOUNTER — Encounter (INDEPENDENT_AMBULATORY_CARE_PROVIDER_SITE_OTHER): Payer: Medicare Other | Admitting: Ophthalmology

## 2020-11-16 ENCOUNTER — Other Ambulatory Visit: Payer: Self-pay | Admitting: Cardiovascular Disease

## 2020-11-16 ENCOUNTER — Encounter (INDEPENDENT_AMBULATORY_CARE_PROVIDER_SITE_OTHER): Payer: Medicare Other | Admitting: Ophthalmology

## 2020-11-20 ENCOUNTER — Telehealth: Payer: Self-pay | Admitting: Cardiovascular Disease

## 2020-11-20 NOTE — Telephone Encounter (Signed)
Returned call to pt she states that she feels that her heart is racing and thinks that it is sometimes irregular. She has taking BP every AM as follows: Today 159/65 HR 61  11-19-20 140/60 HR 65 11-18-20  139/66 HR 59 11-17-20 124/66 HR 52 11-16-20  128/56 HR 67 11-15-20 132/52 HR 70 Pt denies any SOB. CP or pressure etc..Marland KitchenShe states that she was not sure if Dr Gwenlyn Found needed to change anything with the racing and blood pressures since she thinks like she  "sometimes feels like in AFIB". She thinks that the dexamethasone 6 mg tablet is causing the rapid HR.

## 2020-11-20 NOTE — Telephone Encounter (Signed)
Dexamethasone can certainly be the cause for her heart racing as well as the increase in her home BP readings.  She's been on it since the 1st for Covid.  I would recommend that if she's feeling better from the Covid, she should stop the dexamethasone.  Her heart should settle down in a day or so.  If she is still having Covid symptoms, she should reach out to PCP to see if there is something better she can take.

## 2020-11-20 NOTE — Telephone Encounter (Signed)
Pt c/o medication issue:  1. Name of Medication: dexamethasone 6 mg tablet  2. How are you currently taking this medication (dosage and times per day)? Patient taking one tablet 2x daily since 11/13/20  3. Are you having a reaction (difficulty breathing--STAT)?   4. What is your medication issue? Patient thinks the medication is causing her palpitations to be worse than they normally are  Patient c/o Palpitations:  High priority if patient c/o lightheadedness, shortness of breath, or chest pain  How long have you had palpitations/irregular HR/ Afib? Are you having the symptoms now? Since 8/3 (about 2 days after starting the medicine)  Are you currently experiencing lightheadedness, SOB or CP? no  Do you have a history of afib (atrial fibrillation) or irregular heart rhythm? yes  Have you checked your BP or HR? (document readings if available): Patient has taken them. They are saved in her BP machine . She is going to work on writing them down   Are you experiencing any other symptoms? Patient had a headache for one day, but she thinks that may be a result of the COVID

## 2020-11-20 NOTE — Telephone Encounter (Signed)
Pt informed of providers recommendations. Pt verbalized understanding. No further questions . She will stop Dexamethasone, she only had 2 more days to go as she has no more COVID symptoms.

## 2020-11-22 ENCOUNTER — Inpatient Hospital Stay (HOSPITAL_COMMUNITY): Payer: Medicare Other

## 2020-11-22 ENCOUNTER — Emergency Department (HOSPITAL_COMMUNITY): Payer: Medicare Other

## 2020-11-22 ENCOUNTER — Other Ambulatory Visit: Payer: Self-pay

## 2020-11-22 ENCOUNTER — Encounter (HOSPITAL_COMMUNITY): Payer: Self-pay

## 2020-11-22 ENCOUNTER — Inpatient Hospital Stay (HOSPITAL_COMMUNITY)
Admission: EM | Admit: 2020-11-22 | Discharge: 2020-11-27 | DRG: 178 | Disposition: A | Discharge status: Home Health | Payer: Medicare Other | Attending: Internal Medicine | Admitting: Internal Medicine

## 2020-11-22 DIAGNOSIS — R7401 Elevation of levels of liver transaminase levels: Secondary | ICD-10-CM | POA: Diagnosis not present

## 2020-11-22 DIAGNOSIS — W010XXA Fall on same level from slipping, tripping and stumbling without subsequent striking against object, initial encounter: Secondary | ICD-10-CM | POA: Diagnosis present

## 2020-11-22 DIAGNOSIS — Z881 Allergy status to other antibiotic agents status: Secondary | ICD-10-CM

## 2020-11-22 DIAGNOSIS — Z96653 Presence of artificial knee joint, bilateral: Secondary | ICD-10-CM | POA: Diagnosis present

## 2020-11-22 DIAGNOSIS — K219 Gastro-esophageal reflux disease without esophagitis: Secondary | ICD-10-CM | POA: Diagnosis present

## 2020-11-22 DIAGNOSIS — T796XXA Traumatic ischemia of muscle, initial encounter: Secondary | ICD-10-CM | POA: Diagnosis not present

## 2020-11-22 DIAGNOSIS — D72829 Elevated white blood cell count, unspecified: Secondary | ICD-10-CM | POA: Diagnosis not present

## 2020-11-22 DIAGNOSIS — M6282 Rhabdomyolysis: Secondary | ICD-10-CM | POA: Diagnosis not present

## 2020-11-22 DIAGNOSIS — N1831 Chronic kidney disease, stage 3a: Secondary | ICD-10-CM | POA: Diagnosis present

## 2020-11-22 DIAGNOSIS — Z7982 Long term (current) use of aspirin: Secondary | ICD-10-CM | POA: Diagnosis not present

## 2020-11-22 DIAGNOSIS — U071 COVID-19: Secondary | ICD-10-CM | POA: Diagnosis not present

## 2020-11-22 DIAGNOSIS — Y92009 Unspecified place in unspecified non-institutional (private) residence as the place of occurrence of the external cause: Secondary | ICD-10-CM

## 2020-11-22 DIAGNOSIS — I82433 Acute embolism and thrombosis of popliteal vein, bilateral: Secondary | ICD-10-CM | POA: Diagnosis present

## 2020-11-22 DIAGNOSIS — D500 Iron deficiency anemia secondary to blood loss (chronic): Secondary | ICD-10-CM | POA: Insufficient documentation

## 2020-11-22 DIAGNOSIS — D72823 Leukemoid reaction: Secondary | ICD-10-CM

## 2020-11-22 DIAGNOSIS — Z885 Allergy status to narcotic agent status: Secondary | ICD-10-CM

## 2020-11-22 DIAGNOSIS — E876 Hypokalemia: Secondary | ICD-10-CM | POA: Diagnosis not present

## 2020-11-22 DIAGNOSIS — F32A Depression, unspecified: Secondary | ICD-10-CM | POA: Diagnosis present

## 2020-11-22 DIAGNOSIS — M159 Polyosteoarthritis, unspecified: Secondary | ICD-10-CM | POA: Diagnosis present

## 2020-11-22 DIAGNOSIS — F419 Anxiety disorder, unspecified: Secondary | ICD-10-CM | POA: Diagnosis present

## 2020-11-22 DIAGNOSIS — Z981 Arthrodesis status: Secondary | ICD-10-CM

## 2020-11-22 DIAGNOSIS — Z809 Family history of malignant neoplasm, unspecified: Secondary | ICD-10-CM | POA: Diagnosis not present

## 2020-11-22 DIAGNOSIS — Z87891 Personal history of nicotine dependence: Secondary | ICD-10-CM | POA: Diagnosis not present

## 2020-11-22 DIAGNOSIS — Z800 Family history of malignant neoplasm of digestive organs: Secondary | ICD-10-CM | POA: Insufficient documentation

## 2020-11-22 DIAGNOSIS — Z8249 Family history of ischemic heart disease and other diseases of the circulatory system: Secondary | ICD-10-CM | POA: Diagnosis not present

## 2020-11-22 DIAGNOSIS — I82403 Acute embolism and thrombosis of unspecified deep veins of lower extremity, bilateral: Secondary | ICD-10-CM

## 2020-11-22 DIAGNOSIS — Z79899 Other long term (current) drug therapy: Secondary | ICD-10-CM

## 2020-11-22 DIAGNOSIS — W19XXXA Unspecified fall, initial encounter: Secondary | ICD-10-CM

## 2020-11-22 DIAGNOSIS — Z91018 Allergy to other foods: Secondary | ICD-10-CM

## 2020-11-22 DIAGNOSIS — K5900 Constipation, unspecified: Secondary | ICD-10-CM | POA: Diagnosis not present

## 2020-11-22 DIAGNOSIS — M79606 Pain in leg, unspecified: Secondary | ICD-10-CM

## 2020-11-22 DIAGNOSIS — E785 Hyperlipidemia, unspecified: Secondary | ICD-10-CM | POA: Diagnosis present

## 2020-11-22 DIAGNOSIS — I129 Hypertensive chronic kidney disease with stage 1 through stage 4 chronic kidney disease, or unspecified chronic kidney disease: Secondary | ICD-10-CM | POA: Diagnosis present

## 2020-11-22 DIAGNOSIS — I493 Ventricular premature depolarization: Secondary | ICD-10-CM | POA: Insufficient documentation

## 2020-11-22 DIAGNOSIS — G8929 Other chronic pain: Secondary | ICD-10-CM | POA: Diagnosis present

## 2020-11-22 DIAGNOSIS — E559 Vitamin D deficiency, unspecified: Secondary | ICD-10-CM | POA: Diagnosis present

## 2020-11-22 DIAGNOSIS — Z8542 Personal history of malignant neoplasm of other parts of uterus: Secondary | ICD-10-CM

## 2020-11-22 DIAGNOSIS — R197 Diarrhea, unspecified: Secondary | ICD-10-CM | POA: Insufficient documentation

## 2020-11-22 DIAGNOSIS — E1151 Type 2 diabetes mellitus with diabetic peripheral angiopathy without gangrene: Secondary | ICD-10-CM | POA: Diagnosis present

## 2020-11-22 DIAGNOSIS — R634 Abnormal weight loss: Secondary | ICD-10-CM | POA: Insufficient documentation

## 2020-11-22 DIAGNOSIS — E1122 Type 2 diabetes mellitus with diabetic chronic kidney disease: Secondary | ICD-10-CM | POA: Diagnosis present

## 2020-11-22 DIAGNOSIS — I2510 Atherosclerotic heart disease of native coronary artery without angina pectoris: Secondary | ICD-10-CM | POA: Diagnosis present

## 2020-11-22 DIAGNOSIS — R296 Repeated falls: Secondary | ICD-10-CM | POA: Diagnosis present

## 2020-11-22 DIAGNOSIS — T796XXD Traumatic ischemia of muscle, subsequent encounter: Secondary | ICD-10-CM | POA: Diagnosis not present

## 2020-11-22 DIAGNOSIS — N183 Chronic kidney disease, stage 3 unspecified: Secondary | ICD-10-CM

## 2020-11-22 DIAGNOSIS — Z66 Do not resuscitate: Secondary | ICD-10-CM | POA: Diagnosis present

## 2020-11-22 LAB — CBC WITH DIFFERENTIAL/PLATELET
Abs Immature Granulocytes: 0.42 10*3/uL — ABNORMAL HIGH (ref 0.00–0.07)
Basophils Absolute: 0.1 10*3/uL (ref 0.0–0.1)
Basophils Relative: 0 %
Eosinophils Absolute: 0 10*3/uL (ref 0.0–0.5)
Eosinophils Relative: 0 %
HCT: 42.8 % (ref 36.0–46.0)
Hemoglobin: 14.4 g/dL (ref 12.0–15.0)
Immature Granulocytes: 2 %
Lymphocytes Relative: 10 %
Lymphs Abs: 2.1 10*3/uL (ref 0.7–4.0)
MCH: 33 pg (ref 26.0–34.0)
MCHC: 33.6 g/dL (ref 30.0–36.0)
MCV: 98.2 fL (ref 80.0–100.0)
Monocytes Absolute: 2.1 10*3/uL — ABNORMAL HIGH (ref 0.1–1.0)
Monocytes Relative: 10 %
Neutro Abs: 16.3 10*3/uL — ABNORMAL HIGH (ref 1.7–7.7)
Neutrophils Relative %: 78 %
Platelets: 245 10*3/uL (ref 150–400)
RBC: 4.36 MIL/uL (ref 3.87–5.11)
RDW: 13 % (ref 11.5–15.5)
WBC: 21 10*3/uL — ABNORMAL HIGH (ref 4.0–10.5)
nRBC: 0 % (ref 0.0–0.2)

## 2020-11-22 LAB — BASIC METABOLIC PANEL
Anion gap: 8 (ref 5–15)
BUN: 44 mg/dL — ABNORMAL HIGH (ref 8–23)
CO2: 30 mmol/L (ref 22–32)
Calcium: 8.3 mg/dL — ABNORMAL LOW (ref 8.9–10.3)
Chloride: 98 mmol/L (ref 98–111)
Creatinine, Ser: 1.22 mg/dL — ABNORMAL HIGH (ref 0.44–1.00)
GFR, Estimated: 44 mL/min — ABNORMAL LOW (ref >60)
Glucose, Bld: 103 mg/dL — ABNORMAL HIGH (ref 70–99)
Potassium: 3.7 mmol/L (ref 3.5–5.1)
Sodium: 136 mmol/L (ref 135–145)

## 2020-11-22 LAB — CBC
HCT: 41 % (ref 36.0–46.0)
Hemoglobin: 13.5 g/dL (ref 12.0–15.0)
MCH: 32.5 pg (ref 26.0–34.0)
MCHC: 32.9 g/dL (ref 30.0–36.0)
MCV: 98.6 fL (ref 80.0–100.0)
Platelets: 210 10*3/uL (ref 150–400)
RBC: 4.16 MIL/uL (ref 3.87–5.11)
RDW: 13.2 % (ref 11.5–15.5)
WBC: 18.8 10*3/uL — ABNORMAL HIGH (ref 4.0–10.5)
nRBC: 0 % (ref 0.0–0.2)

## 2020-11-22 LAB — GLUCOSE, CAPILLARY
Glucose-Capillary: 110 mg/dL — ABNORMAL HIGH (ref 70–99)
Glucose-Capillary: 192 mg/dL — ABNORMAL HIGH (ref 70–99)

## 2020-11-22 LAB — HEPATITIS PANEL, ACUTE
HCV Ab: NONREACTIVE
Hep A IgM: NONREACTIVE
Hep B C IgM: NONREACTIVE
Hepatitis B Surface Ag: NONREACTIVE

## 2020-11-22 LAB — URINALYSIS, ROUTINE W REFLEX MICROSCOPIC
Bacteria, UA: NONE SEEN
Bilirubin Urine: NEGATIVE
Glucose, UA: NEGATIVE mg/dL
Ketones, ur: NEGATIVE mg/dL
Leukocytes,Ua: NEGATIVE
Nitrite: NEGATIVE
Protein, ur: 30 mg/dL — AB
Specific Gravity, Urine: 1.012 (ref 1.005–1.030)
pH: 6 (ref 5.0–8.0)

## 2020-11-22 LAB — COMPREHENSIVE METABOLIC PANEL
ALT: 111 U/L — ABNORMAL HIGH (ref 0–44)
AST: 241 U/L — ABNORMAL HIGH (ref 15–41)
Albumin: 2.8 g/dL — ABNORMAL LOW (ref 3.5–5.0)
Alkaline Phosphatase: 76 U/L (ref 38–126)
Anion gap: 8 (ref 5–15)
BUN: 40 mg/dL — ABNORMAL HIGH (ref 8–23)
CO2: 29 mmol/L (ref 22–32)
Calcium: 8.4 mg/dL — ABNORMAL LOW (ref 8.9–10.3)
Chloride: 102 mmol/L (ref 98–111)
Creatinine, Ser: 1.06 mg/dL — ABNORMAL HIGH (ref 0.44–1.00)
GFR, Estimated: 52 mL/min — ABNORMAL LOW (ref >60)
Glucose, Bld: 100 mg/dL — ABNORMAL HIGH (ref 70–99)
Potassium: 3.9 mmol/L (ref 3.5–5.1)
Sodium: 139 mmol/L (ref 135–145)
Total Bilirubin: 1 mg/dL (ref 0.3–1.2)
Total Protein: 5.4 g/dL — ABNORMAL LOW (ref 6.5–8.1)

## 2020-11-22 LAB — CK
Total CK: 10185 U/L — ABNORMAL HIGH (ref 38–234)
Total CK: 13299 U/L — ABNORMAL HIGH (ref 38–234)

## 2020-11-22 LAB — RESP PANEL BY RT-PCR (FLU A&B, COVID) ARPGX2
Influenza A by PCR: NEGATIVE
Influenza B by PCR: NEGATIVE
SARS Coronavirus 2 by RT PCR: POSITIVE — AB

## 2020-11-22 LAB — C-REACTIVE PROTEIN: CRP: 0.6 mg/dL (ref <1.0)

## 2020-11-22 LAB — HEMOGLOBIN A1C
Hgb A1c MFr Bld: 6.7 % — ABNORMAL HIGH (ref 4.8–5.6)
Mean Plasma Glucose: 145.59 mg/dL

## 2020-11-22 LAB — D-DIMER, QUANTITATIVE: D-Dimer, Quant: 6.98 ug/mL-FEU — ABNORMAL HIGH (ref 0.00–0.50)

## 2020-11-22 LAB — CBG MONITORING, ED: Glucose-Capillary: 84 mg/dL (ref 70–99)

## 2020-11-22 LAB — FERRITIN: Ferritin: 159 ng/mL (ref 11–307)

## 2020-11-22 MED ORDER — ONDANSETRON HCL 4 MG PO TABS: 4.0000 mg | ORAL_TABLET | Freq: Four times a day (QID) | ORAL | Status: DC | PRN

## 2020-11-22 MED ORDER — SODIUM CHLORIDE 0.9 % IV SOLN: INTRAVENOUS | Status: DC

## 2020-11-22 MED ORDER — SODIUM CHLORIDE 0.9 % IV BOLUS
1000.0000 mL | Freq: Once | INTRAVENOUS | Status: AC
  Administered 2020-11-22: 1000 mL via INTRAVENOUS

## 2020-11-22 MED ORDER — REMDESIVIR 100 MG IV SOLR
100.0000 mg | INTRAVENOUS | Status: AC
  Administered 2020-11-22 (×2): 100 mg via INTRAVENOUS
  Filled 2020-11-22 (×2): qty 20

## 2020-11-22 MED ORDER — INSULIN ASPART 100 UNIT/ML IJ SOLN
0.0000 [IU] | Freq: Three times a day (TID) | INTRAMUSCULAR | Status: DC
  Administered 2020-11-23 – 2020-11-24 (×2): 2 [IU] via SUBCUTANEOUS
  Administered 2020-11-25: 1 [IU] via SUBCUTANEOUS
  Administered 2020-11-25: 2 [IU] via SUBCUTANEOUS
  Administered 2020-11-26 – 2020-11-27 (×2): 1 [IU] via SUBCUTANEOUS

## 2020-11-22 MED ORDER — HEPARIN SODIUM (PORCINE) 5000 UNIT/ML IJ SOLN
5000.0000 [IU] | Freq: Three times a day (TID) | INTRAMUSCULAR | Status: DC
  Administered 2020-11-22 – 2020-11-23 (×4): 5000 [IU] via SUBCUTANEOUS
  Filled 2020-11-22 (×4): qty 1

## 2020-11-22 MED ORDER — ACETAMINOPHEN 325 MG PO TABS
650.0000 mg | ORAL_TABLET | Freq: Four times a day (QID) | ORAL | Status: DC | PRN
  Administered 2020-11-22 – 2020-11-25 (×5): 650 mg via ORAL
  Filled 2020-11-22 (×5): qty 2

## 2020-11-22 MED ORDER — METOPROLOL SUCCINATE ER 25 MG PO TB24
25.0000 mg | ORAL_TABLET | Freq: Every day | ORAL | Status: DC
  Administered 2020-11-22 – 2020-11-27 (×6): 25 mg via ORAL
  Filled 2020-11-22 (×6): qty 1

## 2020-11-22 MED ORDER — AMLODIPINE BESYLATE 5 MG PO TABS
2.5000 mg | ORAL_TABLET | Freq: Every day | ORAL | Status: DC
  Administered 2020-11-22 – 2020-11-27 (×6): 2.5 mg via ORAL
  Filled 2020-11-22 (×6): qty 1

## 2020-11-22 MED ORDER — SODIUM CHLORIDE 0.9 % IV SOLN
100.0000 mg | Freq: Every day | INTRAVENOUS | Status: AC
  Administered 2020-11-23 – 2020-11-26 (×4): 100 mg via INTRAVENOUS
  Filled 2020-11-22 (×4): qty 20

## 2020-11-22 MED ORDER — LOSARTAN POTASSIUM 50 MG PO TABS
25.0000 mg | ORAL_TABLET | Freq: Every day | ORAL | Status: DC
  Administered 2020-11-22 – 2020-11-27 (×6): 25 mg via ORAL
  Filled 2020-11-22 (×6): qty 1

## 2020-11-22 MED ORDER — ACETAMINOPHEN 650 MG RE SUPP: 650.0000 mg | Freq: Four times a day (QID) | RECTAL | Status: DC | PRN

## 2020-11-22 MED ORDER — NYSTATIN 100000 UNIT/GM EX POWD
Freq: Three times a day (TID) | CUTANEOUS | Status: DC
  Filled 2020-11-22 (×3): qty 15

## 2020-11-22 MED ORDER — ASPIRIN EC 81 MG PO TBEC
81.0000 mg | DELAYED_RELEASE_TABLET | Freq: Every day | ORAL | Status: DC
  Administered 2020-11-22 – 2020-11-23 (×2): 81 mg via ORAL
  Filled 2020-11-22 (×2): qty 1

## 2020-11-22 MED ORDER — SODIUM BICARBONATE 8.4 % IV SOLN
INTRAVENOUS | Status: DC
  Filled 2020-11-22 (×9): qty 1000

## 2020-11-22 MED ORDER — DARIFENACIN HYDROBROMIDE ER 7.5 MG PO TB24
7.5000 mg | ORAL_TABLET | Freq: Every day | ORAL | Status: DC
  Administered 2020-11-22 – 2020-11-27 (×6): 7.5 mg via ORAL
  Filled 2020-11-22 (×6): qty 1

## 2020-11-22 MED ORDER — ONDANSETRON HCL 4 MG/2ML IJ SOLN: 4.0000 mg | Freq: Four times a day (QID) | INTRAMUSCULAR | Status: DC | PRN
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
## 2020-11-22 NOTE — ED Notes (Signed)
Pt placed on cardiac monitor with BP to set cycle every 30 minutes. Continuous pulse oximeter applied.  

## 2020-11-22 NOTE — ED Notes (Signed)
Pt repositioned at this time.

## 2020-11-22 NOTE — ED Notes (Signed)
Patient transported to CT 

## 2020-11-22 NOTE — H&P (Addendum)
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 status: Inpatient :The appropriate admission status for this patient is INPATIENT. Inpatient status is judged to be reasonable and necessary in order to provide the required intensity of service to ensure the patient's safety. The patient's presenting symptoms, physical exam  findings, and initial radiographic and laboratory data in the context of their chronic comorbidities is felt to place them at high risk for further clinical deterioration. Furthermore, it is not anticipated that the patient will be medically stable for discharge from the hospital within 2 midnights of admission. The following factors support the admission status of inpatient.     The patient's presenting symptoms include fall. The worrisome physical exam findings include generalized weakness. The initial radiographic and laboratory data are worrisome because of CPK 10,000. The chronic co-morbidities include diabetes mellitus type 2, hyperlipidemia, hypertension, GERD.       * I certify that at the point of admission it is my clinical judgment that the patient will require inpatient hospital care spanning beyond 2 midnights from the point of admission due to high intensity of service, high risk for further deterioration  and high frequency of surveillance required.*  Time spent in minutes : [place]

## 2020-11-22 NOTE — ED Notes (Signed)
Pt returned from CT °

## 2020-11-22 NOTE — ED Notes (Signed)
Critical Result: Covid + Dr. Dina Rich notified.

## 2020-11-22 NOTE — Progress Notes (Signed)
PROGRESS NOTE  Leslie Hendricks [id number] DOB: 12-30-1940 DOA: 11/22/2020 PCP: Curly Rim, MD  Brief History:    80 y.o. female, with history of anemia, DMII, frequent falls, vision loss 2/2 macular degeneration, GERD, headache, HLD, HTN, and more presents to the ED with a chief complaint of fall. Patient reports that she fell in her closet. She was walking along when her legs became so weak, they just gave out from under her. She reports that she hit under her chin, but did not hit her head, did not lose consciousness. She had no preceding chest pain, dizziness, or dyspnea. She reports that she has been getting progressively weaker since she was diagnosed with covid with a positive home test on 11/16/20. Patient was not able to get up on her own. Her sister called EMS upon discovering that patient was on the floor, and the paramedics had to help get her up. She thinks she was down approx 4 hours.  She has had palpitations when she is coughing for the last 3 days. Patient reports chronic headaches that are better with tylenol. She has no change in vision or hearing. Patient has not had dyspnea. She reports that she does have weakness greater on the right than the left, but that this has been present for 4-5 years. She denies cp, sob, n/v/d, abd pain.  She does not smoke, does not drink, and does not use illicit drugs. Patient is DNR.  Assessment/Plan: Rhabdomyolysis -CPK peaking 13,299 -Start bicarbonate drip -Holding Lipitor  COVID-19 infection -Stable on room air with saturation 94-95% -Start remdesivir -CRP -Ferritin -D-dimer  Essential hypertension -Continue amlodipine and losartan -Continue metoprolol succinate  CKD stage IIIa -Baseline creatinine 0.9-1.2 -Monitor BMP  Transaminasemia -Secondary COVID-19 infection and rhabdomyolysis -RUQ [place]  Depression/anxiety -Continue trazodone and Effexor  Leg Edema -venous duplex      Status is:  Inpatient  Remains inpatient appropriate because:Inpatient level of care appropriate due to severity of illness  Dispo: The patient is from: Home              Anticipated d/c is to: Home              Patient currently is not medically stable to d/c.   Difficult to place patient No        Family Communication:   no Family at bedside  Consultants:  none  Code Status:  FULL  DVT Prophylaxis:  [place] Lovenox   Procedures: As Listed in Progress Note Above  Antibiotics: None        Subjective: Patient denies fevers, chills, headache, chest pain, dyspnea, nausea, vomiting, diarrhea, abdominal pain, dysuria, hematuria, hematochezia, and melena. She has dry cough  Objective: Vitals:   11/22/20 0500 11/22/20 0530 11/22/20 0611 11/22/20 0630  BP: (!) 153/60 (!) 156/59 (!) 154/61 (!) 160/96  Pulse: (!) 59 62 61 63  Resp: '19 18 18 18  '$ Temp:      TempSrc:      SpO2: 91% 94% 94% 95%  Weight:      Height:       No intake or output data in the 24 hours ending 11/22/20 0723 Weight change:  Exam:  General:  Pt is alert, follows commands appropriately, not in acute distress HEENT: No icterus, No thrush, No neck mass, [place]/AT Cardiovascular: RRR, S1/S2, no rubs, no gallops Respiratory: bibasilar rales. No wheeze Abdomen: Soft/+BS, non tender, non distended, no guarding Extremities: N1 +  LEedema, No lymphangitis, No petechiae, No rashes, no synovitis   Data Reviewed: I have personally reviewed following labs and imaging studies Basic Metabolic Panel: Recent Labs  Lab 11/22/20 0052 11/22/20 0533  NA 136 139  K 3.7 3.9  CL 98 102  CO2 30 29  GLUCOSE 103* 100*  BUN 44* 40*  CREATININE 1.22* 1.06*  CALCIUM 8.3* 8.4*   Liver Function Tests: Recent Labs  Lab 11/22/20 0533  AST 241*  ALT 111*  ALKPHOS 76  BILITOT 1.0  PROT 5.4*  ALBUMIN 2.8*   No results for input(s): LIPASE, AMYLASE in the last 168 hours. No results for input(s): AMMONIA in the last 168  hours. Coagulation Profile: No results for input(s): INR, PROTIME in the last 168 hours. CBC: Recent Labs  Lab 11/22/20 0052 11/22/20 0533  WBC 21.0* 18.8*  NEUTROABS 16.3*  --   HGB 14.4 13.5  HCT 42.8 41.0  MCV 98.2 98.6  PLT 245 210   Cardiac Enzymes: Recent Labs  Lab 11/22/20 0052 11/22/20 0533  CKTOTAL 10,185* 13,299*   BNP: Invalid input(s): POCBNP CBG: No results for input(s): GLUCAP in the last 168 hours. HbA1C: No results for input(s): HGBA1C in the last 72 hours. Urine analysis:    Component Value Date/Time   COLORURINE YELLOW 11/22/2020 0053   APPEARANCEUR CLEAR 11/22/2020 0053   LABSPEC 1.012 11/22/2020 0053   PHURINE 6.0 11/22/2020 0053   GLUCOSEU NEGATIVE 11/22/2020 0053   HGBUR LARGE (A) 11/22/2020 0053   BILIRUBINUR NEGATIVE 11/22/2020 0053   KETONESUR NEGATIVE 11/22/2020 0053   PROTEINUR 30 (A) 11/22/2020 0053   UROBILINOGEN 0.2 04/25/2014 1355   NITRITE NEGATIVE 11/22/2020 0053   LEUKOCYTESUR NEGATIVE 11/22/2020 0053   Sepsis Labs: '@LABRCNTIP'$ (procalcitonin:4,lacticidven:4) ) Recent Results (from the past 240 hour(s))  Resp Panel by RT-PCR (Flu A&B, Covid) Urine, Clean Catch     Status: Abnormal   Collection Time: 11/22/20 12:53 AM   Specimen: Urine, Clean Catch; Nasopharyngeal(NP) swabs in vial transport medium  Result Value Ref Range Status   SARS Coronavirus 2 by RT PCR POSITIVE (A) NEGATIVE Final    Comment: RESULT CALLED TO, READ BACK BY AND VERIFIED WITH: Spence,H'@0303'$  by Zigmund Daniel, b 8.10.22 (NOTE) SARS-CoV-2 target nucleic acids are DETECTED.  The SARS-CoV-2 RNA is generally detectable in upper respiratory specimens during the acute phase of infection. Positive results are indicative of the presence of the identified virus, but do not rule out bacterial infection or co-infection with other pathogens not detected by the test. Clinical correlation with patient history and other diagnostic information is necessary to determine  patient infection status. The expected result is Negative.  Fact Sheet for Patients: EntrepreneurPulse.com.au  Fact Sheet for Healthcare Providers: IncredibleEmployment.be  This test is not yet approved or cleared by the [place] FDA and  has been authorized for detection and/or diagnosis of SARS-CoV-2 by FDA under an Emergency Use Authorization (EUA).  This EUA will remain in effect (meaning this test can  be used) for the duration of  the COVID-19 declaration under Section 564(b)(1) of the Act, 21 U.S.C. section 360bbb-3(b)(1), unless the authorization is terminated or revoked sooner.     Influenza A by PCR NEGATIVE NEGATIVE Final   Influenza B by PCR NEGATIVE NEGATIVE Final    Comment: (NOTE) The Xpert Xpress SARS-CoV-2/FLU/RSV plus assay is intended as an aid in the diagnosis of influenza from Nasopharyngeal swab specimens and should not be used as a sole basis for treatment. Nasal washings and aspirates are unacceptable for  Xpert Xpress SARS-CoV-2/FLU/RSV testing.  Fact Sheet for Patients: EntrepreneurPulse.com.au  Fact Sheet for Healthcare Providers: IncredibleEmployment.be  This test is not yet approved or cleared by the [place] FDA and has been authorized for detection and/or diagnosis of SARS-CoV-2 by FDA under an Emergency Use Authorization (EUA). This EUA will remain in effect (meaning this test can be used) for the duration of the COVID-19 declaration under Section 564(b)(1) of the Act, 21 U.S.C. section 360bbb-3(b)(1), unless the authorization is terminated or revoked.  Performed at [facility], [address], [place], [place] [address]      Scheduled Meds: Continuous Infusions:  sodium chloride 200 mL/hr at 11/22/20 0544    Procedures/Studies: DG Chest 2 View  Result Date: 11/22/2020 CLINICAL DATA:  Recent fall with chest pain, initial encounter EXAM: CHEST - 2 VIEW  COMPARISON:  06/12/2013 FINDINGS: Cardiac shadow is within normal limits. Aortic calcifications are noted. Postsurgical changes are noted in the cervical spine. Lungs are well aerated bilaterally. No focal infiltrate or sizable effusion is seen. Mild scarring is noted in the left base. No bony abnormality is noted. IMPRESSION: No acute abnormality noted. Electronically Signed   By: Inez Catalina M.D.   On: 11/22/2020 02:18   DG Lumbar Spine Complete  Result Date: 11/22/2020 CLINICAL DATA:  Recent fall with low back pain, initial encounter EXAM: LUMBAR SPINE - COMPLETE 4+ VIEW COMPARISON:  05/27/2013 FINDINGS: Five lumbar type vertebral bodies are well visualized. Changes of prior fusion are noted from L2-S1 with posterior fixation. The overall appearance is similar to that seen on the prior exam. No hardware failure is noted. Degenerative changes at L1-2 are seen. Fecal material is noted within the colon consistent with mild constipation. IMPRESSION: Degenerative change without acute abnormality. Mild colonic constipation. Electronically Signed   By: Inez Catalina M.D.   On: 11/22/2020 02:21   CT HEAD WO CONTRAST (5MM)  Result Date: 11/22/2020 CLINICAL DATA:  Found on floor. EXAM: CT HEAD WITHOUT CONTRAST TECHNIQUE: Contiguous axial images were obtained from the base of the skull through the vertex without intravenous contrast. COMPARISON:  05/27/2013 FINDINGS: Brain: Age related volume loss. No acute intracranial abnormality. Specifically, no hemorrhage, hydrocephalus, mass lesion, acute infarction, or significant intracranial injury. Vascular: No hyperdense vessel or unexpected calcification. Skull: No acute calvarial abnormality. Sinuses/Orbits: No acute findings Other: None IMPRESSION: No acute intracranial abnormality. Electronically Signed   By: Rolm Baptise M.D.   On: 11/22/2020 01:34   CT Cervical Spine Wo Contrast  Result Date: 11/22/2020 CLINICAL DATA:  Neck trauma (Age >= 65y).  Found down.  EXAM: CT CERVICAL SPINE WITHOUT CONTRAST TECHNIQUE: Multidetector CT imaging of the cervical spine was performed without intravenous contrast. Multiplanar CT image reconstructions were also generated. COMPARISON:  MRI 06/24/2018 FINDINGS: Alignment: Minimal 2 mm degenerative anterolisthesis of C2 on C3. Skull base and vertebrae: No acute fracture. No primary bone lesion or focal pathologic process. Soft tissues and spinal canal: No prevertebral fluid or swelling. No visible canal hematoma. Disc levels: Anterior fusion from C3-C6. Diffuse advanced degenerative disc and facet disease. Upper chest: No acute findings Other: None IMPRESSION: Anterior fusion changes from C3-C6. Diffuse degenerative disc and facet disease. No acute bony abnormality. Electronically Signed   By: Rolm Baptise M.D.   On: 11/22/2020 01:34   DG Knee Complete 4 Views Left  Result Date: 11/22/2020 CLINICAL DATA:  Recent fall with left knee pain, initial encounter EXAM: LEFT KNEE - COMPLETE 4+ VIEW COMPARISON:  07/04/2018 FINDINGS: Left knee prosthesis is  noted in satisfactory position. No acute fracture or dislocation is noted. No joint effusion is seen. Vascular calcifications are seen. IMPRESSION: No acute abnormality noted. Electronically Signed   By: Inez Catalina M.D.   On: 11/22/2020 02:18   DG Knee Complete 4 Views Right  Result Date: 11/22/2020 CLINICAL DATA:  Recent fall with knee pain, initial encounter EXAM: RIGHT KNEE - COMPLETE 4+ VIEW COMPARISON:  11/29/2011 FINDINGS: Right knee prosthesis is noted. No joint effusion is seen. No acute fracture or dislocation is seen. Vascular calcifications are noted. IMPRESSION: No acute abnormality noted. Electronically Signed   By: Inez Catalina M.D.   On: 11/22/2020 02:20    Orson Eva, DO  [facility]  If 7PM-7AM, please contact night-coverage [email or web address] Password [email or web address] 11/22/2020, 7:23 AM   LOS: 0 days

## 2020-11-22 NOTE — ED Triage Notes (Signed)
Pt found in floor by family, had layed there about 4 hours. C/O left knee pain, back pain. Denies hitting head, denies loc.

## 2020-11-23 DIAGNOSIS — I82403 Acute embolism and thrombosis of unspecified deep veins of lower extremity, bilateral: Secondary | ICD-10-CM

## 2020-11-23 LAB — GLUCOSE, CAPILLARY
Glucose-Capillary: 100 mg/dL — ABNORMAL HIGH (ref 70–99)
Glucose-Capillary: 169 mg/dL — ABNORMAL HIGH (ref 70–99)
Glucose-Capillary: 80 mg/dL (ref 70–99)
Glucose-Capillary: 205 mg/dL — ABNORMAL HIGH (ref 70–99)

## 2020-11-23 LAB — COMPREHENSIVE METABOLIC PANEL
ALT: 104 U/L — ABNORMAL HIGH (ref 0–44)
AST: 210 U/L — ABNORMAL HIGH (ref 15–41)
Albumin: 2.4 g/dL — ABNORMAL LOW (ref 3.5–5.0)
Alkaline Phosphatase: 60 U/L (ref 38–126)
Anion gap: 6 (ref 5–15)
BUN: 23 mg/dL (ref 8–23)
CO2: 31 mmol/L (ref 22–32)
Calcium: 7.9 mg/dL — ABNORMAL LOW (ref 8.9–10.3)
Chloride: 101 mmol/L (ref 98–111)
Creatinine, Ser: 0.87 mg/dL (ref 0.44–1.00)
GFR, Estimated: >60 mL/min (ref >60)
Glucose, Bld: 115 mg/dL — ABNORMAL HIGH (ref 70–99)
Potassium: 3.6 mmol/L (ref 3.5–5.1)
Sodium: 138 mmol/L (ref 135–145)
Total Bilirubin: 1 mg/dL (ref 0.3–1.2)
Total Protein: 4.8 g/dL — ABNORMAL LOW (ref 6.5–8.1)

## 2020-11-23 LAB — CBC
HCT: 40.2 % (ref 36.0–46.0)
Hemoglobin: 12.9 g/dL (ref 12.0–15.0)
MCH: 32.2 pg (ref 26.0–34.0)
MCHC: 32.1 g/dL (ref 30.0–36.0)
MCV: 100.2 fL — ABNORMAL HIGH (ref 80.0–100.0)
Platelets: 183 10*3/uL (ref 150–400)
RBC: 4.01 MIL/uL (ref 3.87–5.11)
RDW: 13.5 % (ref 11.5–15.5)
WBC: 10.6 10*3/uL — ABNORMAL HIGH (ref 4.0–10.5)
nRBC: 0 % (ref 0.0–0.2)

## 2020-11-23 LAB — MAGNESIUM: Magnesium: 2.5 mg/dL — ABNORMAL HIGH (ref 1.7–2.4)

## 2020-11-23 LAB — D-DIMER, QUANTITATIVE: D-Dimer, Quant: 3.93 ug/mL-FEU — ABNORMAL HIGH (ref 0.00–0.50)

## 2020-11-23 LAB — C-REACTIVE PROTEIN: CRP: 2.9 mg/dL — ABNORMAL HIGH (ref <1.0)

## 2020-11-23 LAB — CK: Total CK: 6204 U/L — ABNORMAL HIGH (ref 38–234)

## 2020-11-23 MED ORDER — SENNA 8.6 MG PO TABS
2.0000 | ORAL_TABLET | Freq: Every day | ORAL | Status: DC
  Administered 2020-11-23 – 2020-11-26 (×4): 17.2 mg via ORAL
  Filled 2020-11-23 (×4): qty 2

## 2020-11-23 MED ORDER — TRAZODONE HCL 50 MG PO TABS
100.0000 mg | ORAL_TABLET | Freq: Every day | ORAL | Status: DC
  Administered 2020-11-23 – 2020-11-26 (×4): 100 mg via ORAL
  Filled 2020-11-23 (×4): qty 2

## 2020-11-23 MED ORDER — APIXABAN 5 MG PO TABS: 5.0000 mg | ORAL_TABLET | Freq: Two times a day (BID) | ORAL | Status: DC

## 2020-11-23 MED ORDER — HYDROCODONE-ACETAMINOPHEN 5-325 MG PO TABS
1.0000 | ORAL_TABLET | Freq: Four times a day (QID) | ORAL | Status: DC | PRN
  Administered 2020-11-23 – 2020-11-27 (×4): 1 via ORAL
  Filled 2020-11-23 (×4): qty 1

## 2020-11-23 MED ORDER — APIXABAN 5 MG PO TABS
10.0000 mg | ORAL_TABLET | Freq: Two times a day (BID) | ORAL | Status: DC
Start: 2020-11-23 — End: 2020-11-27
  Administered 2020-11-23 – 2020-11-27 (×9): 10 mg via ORAL
  Filled 2020-11-23 (×9): qty 2

## 2020-11-23 MED ORDER — POLYETHYLENE GLYCOL 3350 17 G PO PACK
17.0000 g | PACK | Freq: Every day | ORAL | Status: DC
  Administered 2020-11-23 – 2020-11-26 (×4): 17 g via ORAL
  Filled 2020-11-23 (×4): qty 1

## 2020-11-23 MED ORDER — LINACLOTIDE 72 MCG PO CAPS
72.0000 ug | ORAL_CAPSULE | Freq: Every day | ORAL | Status: DC
  Administered 2020-11-24 – 2020-11-27 (×4): 72 ug via ORAL
  Filled 2020-11-23 (×6): qty 1

## 2020-11-23 NOTE — Plan of Care (Signed)
  Problem: Acute Rehab PT Goals(only PT should resolve) Goal: Pt Will Go Supine/Side To Sit Outcome: Progressing Flowsheets (Taken 11/23/2020 1515) Pt will go Supine/Side to Sit: with minimal assist Goal: Pt Will Go Sit To Supine/Side Outcome: Progressing Flowsheets (Taken 11/23/2020 1515) Pt will go Sit to Supine/Side: with minimal assist Goal: Patient Will Transfer Sit To/From Stand Outcome: Progressing Flowsheets (Taken 11/23/2020 1515) Patient will transfer sit to/from stand:  with minimal assist  with moderate assist Goal: Pt Will Transfer Bed To Chair/Chair To Bed Outcome: Progressing Flowsheets (Taken 11/23/2020 1515) Pt will Transfer Bed to Chair/Chair to Bed:  with min assist  with mod assist Goal: Pt Will Ambulate Outcome: Progressing Flowsheets (Taken 11/23/2020 1515) Pt will Ambulate:  10 feet  15 feet  with minimal assist  with moderate assist  with least restrictive assistive device  3:16 PM, 11/23/20 Mearl Latin PT, DPT Physical Therapist at [facility]

## 2020-11-23 NOTE — TOC Progression Note (Signed)
Transition of Care [facility] [place]) - Progression Note    Patient Details  Name: Leslie Hendricks MRN: [id number] Date of Birth: 07/30/1940  Transition [facility]) CM/SW Contact  [name], RN Phone Number: 11/23/2020, 3:49 PM  Clinical Narrative:   PT is recommending SNF, Patient is agreeable. First choice is [facility]. [facility] sent out for bed offers. TOC to follow.   Expected Discharge Plan: Skilled Nursing Facility Barriers to Discharge: Continued Medical Work up  Expected Discharge Plan and Services Expected Discharge Plan: [place] In-house Referral: Clinical Social Work    Living arrangements for the past 2 months: Single Family Home                  Readmission Risk Interventions Readmission Risk Prevention Plan 11/22/2020  Transportation Screening Complete  HRI or Home Care Consult Complete  Social Work Consult for [place] Planning/Counseling Complete  Palliative Care Screening Not Applicable  Medication Review Press photographer) Complete  Some recent data might be hidden

## 2020-11-23 NOTE — Evaluation (Signed)
Physical Therapy Evaluation Patient Details Name: Leslie Hendricks MRN: [id number] DOB: 1940/12/26 Today's Date: 11/23/2020   History of Present Illness  Leslie Hendricks  is a 80 y.o. female, with history of anemia, DMII, frequent falls, vision loss 2/2 macular degeneration, GERD, headache, HLD, HTN, and more presents to the ED with a chief complaint of fall. Patient reports that she fell in her closet. She was walking along when her legs became so weak, they just gave out from under her. She reports that she hit under her chin, but did not hit her head, did not lose consciousness. She had no preceding chest pain, dizziness, or dyspnea. She reports that she has been getting progressively weaker since she was diagnosed with covid. Patient was not able to get up on her own. A family member called EMS upon discovering that patient was on the floor, and the paramedics had to help get her up. She thinks she was down approx 4 hours, but irritation/early wounds would seem like she was down longer than 4 hours. Patient reports that she has had a cough productive of clear sputum. She has had palpitations when she is coughing for the last 3 days. Patient reports chronic headaches that are better with tylenol. She has no change in vision or hearing. Patient has not had dyspnea. She reports that she does have weakness greater on the right than the left, but that this has been present for 4-5 years. She has had a tmax as high as 101 one week ago. She reports her physician gave her pills for covid, but she doesn't think they were called "paxlovid" and she doesn't remember the name of them. Patient reports that she has had a normal appetite at home. She also has chronic peripheral edema. Patient has no other complaints at this time.    Clinical Impression  Patient limited for functional mobility as stated below secondary to BLE weakness, fatigue and poor balance. Patient able to pull to sitting near EOB with assist but  unable to fully scoot self to EOB secondary to weakness and fatigue. Patient demonstrates good sitting tolerance and sitting balance. Patient unable to complete additional mobility due to weakness/fatigue today. Patient assisted back to supine. Patient will benefit from continued physical therapy in hospital and recommended venue below to increase strength, balance, endurance for safe ADLs and gait.     Follow Up Recommendations SNF    Equipment Recommendations  None recommended by PT    Recommendations for Other Services       Precautions / Restrictions Precautions Precautions: Fall Restrictions Weight Bearing Restrictions: No      Mobility  Bed Mobility Overal bed mobility: Needs Assistance Bed Mobility: Supine to Sit;Sit to Supine     Supine to sit: Mod assist Sit to supine: Mod assist   General bed mobility comments: assist for LE movement and to pull to sitting, unable to fully scoot to EOB due to weakness    Transfers                    Ambulation/Gait                Stairs            Wheelchair Mobility    Modified Rankin (Stroke Patients Only)       Balance Overall balance assessment: Needs assistance Sitting-balance support: No upper extremity supported Sitting balance-Leahy Scale: Good Sitting balance - Comments: sitting near EOB  Pertinent Vitals/Pain Pain Assessment: Faces Faces Pain Scale: Hurts little more Pain Location: "everywhere" Pain Descriptors / Indicators: Sore Pain Intervention(s): Limited activity within patient's tolerance;Monitored during session;Repositioned    Home Living Family/patient expects to be discharged to:: Private residence Living Arrangements: Alone Available Help at Discharge: Family Type of Home: House Home Access: Ramped entrance     Home Layout: One level Home Equipment: Environmental consultant - 4 wheels;Bedside commode;Shower seat;Grab bars -  tub/shower      Prior Function Level of Independence: Independent with assistive device(s)         Comments: patient states independent with ADL, household ambulator with RW, has been getting weaker recently and worse since she got Covid     Hand Dominance        Extremity/Trunk Assessment   Upper Extremity Assessment Upper Extremity Assessment: Generalized weakness    Lower Extremity Assessment Lower Extremity Assessment: Generalized weakness    Cervical / Trunk Assessment Cervical / Trunk Assessment: Normal  Communication   Communication: No difficulties  Cognition Arousal/Alertness: Awake/alert Behavior During Therapy: WFL for tasks assessed/performed Overall Cognitive Status: Within Functional Limits for tasks assessed                                        General Comments      Exercises     Assessment/Plan    PT Assessment Patient needs continued PT services  PT Problem List Decreased strength;Decreased mobility;Decreased activity tolerance;Decreased balance;Decreased knowledge of use of DME       PT Treatment Interventions DME instruction;Therapeutic exercise;Gait training;Balance training;Stair training;Neuromuscular re-education;Functional mobility training;Patient/family education;Therapeutic activities    PT Goals (Current goals can be found in the Care Plan section)  Acute Rehab PT Goals Patient Stated Goal: go to rehab and go home PT Goal Formulation: With patient Time For Goal Achievement: 12/07/20 Potential to Achieve Goals: Good    Frequency Min 3X/week   Barriers to discharge        Co-evaluation               AM-PAC PT "6 Clicks" Mobility  Outcome Measure Help needed turning from your back to your side while in a flat bed without using bedrails?: A Little Help needed moving from lying on your back to sitting on the side of a flat bed without using bedrails?: A Little Help needed moving to and from a bed to a  chair (including a wheelchair)?: A Lot Help needed standing up from a chair using your arms (e.g., wheelchair or bedside chair)?: A Lot Help needed to walk in hospital room?: A Lot Help needed climbing 3-5 steps with a railing? : Total 6 Click Score: 13    End of Session   Activity Tolerance: Patient limited by fatigue Patient left: in bed;with call bell/phone within reach Nurse Communication: Mobility status PT Visit Diagnosis: Other abnormalities of gait and mobility (R26.89);Unsteadiness on feet (R26.81);Repeated falls (R29.6);Muscle weakness (generalized) (M62.81)    Time: 1440-1501 PT Time Calculation (min) (ACUTE ONLY): 21 min   Charges:   PT Evaluation $PT Eval Low Complexity: 1 Low PT Treatments $Therapeutic Activity: 8-22 mins        3:14 PM, 11/23/20 Mearl Latin PT, DPT Physical Therapist at [facility]

## 2020-11-23 NOTE — Progress Notes (Signed)
PROGRESS NOTE  Leslie Hendricks [id number] DOB: 1940-12-28 DOA: 11/22/2020 PCP: Curly Rim, MD   Brief History:    80 y.o. female, with history of anemia, DMII, frequent falls, vision loss 2/2 macular degeneration, GERD, headache, HLD, HTN, and more presents to the ED with a chief complaint of fall. Patient reports that she fell in her closet. She was walking along when her legs became so weak, they just gave out from under her. She reports that she hit under her chin, but did not hit her head, did not lose consciousness. She had no preceding chest pain, dizziness, or dyspnea. She reports that she has been getting progressively weaker since she was diagnosed with covid with a positive home test on 11/16/20. Patient was not able to get up on her own. Her sister called EMS upon discovering that patient was on the floor, and the paramedics had to help get her up. She thinks she was down approx 4 hours.  She has had palpitations when she is coughing for the last 3 days. Patient reports chronic headaches that are better with tylenol. She has no change in vision or hearing. Patient has not had dyspnea. She reports that she does have weakness greater on the right than the left, but that this has been present for 4-5 years. She denies cp, sob, n/v/d, abd pain.  She does not smoke, does not drink, and does not use illicit drugs. Patient is DNR.   Assessment/Plan: Rhabdomyolysis -CPK peaking 13,299>>6204 -Continue bicarbonate drip -Holding Lipitor   COVID-19 infection -Stable on room air with saturation 94-95% -Started remdesivir -CRP 0.6>>2.9 -Ferritin 159 -D-dimer 6.98>>3.93   Essential hypertension -Continue amlodipine and losartan -Continue metoprolol succinate   CKD stage IIIa -Baseline creatinine 0.9-1.2 -Monitor BMP   Transaminasemia -Secondary COVID-19 infection and rhabdomyolysis -RUQ US--Coarsened echotexture and nodular surface contour of the liver suggesting mild  cirrhosis;  Geographic region of increased echogenicity in the left hepatic lobe may be related to focal fatty change-->outpt MR liver   Depression/anxiety -Continue trazodone and Effexor   Acute Leg DVTs -11/22/20 venous duplex--for bilateral popliteal DVT with extension into the calf tibial and peroneal veins -start apixaban   Constipation -start miralax and senna -restart linzess         Status is: Inpatient   Remains inpatient appropriate because:Inpatient level of care appropriate due to severity of illness   Dispo: The patient is from: Home              Anticipated d/c is to: Home              Patient currently is not medically stable to d/c.              Difficult to place patient No               Family Communication:   no Family at bedside   Consultants:  none   Code Status:  FULL   DVT Prophylaxis:  apixaban     Procedures: As Listed in Progress Note Above   Antibiotics: None     Subjective: She complains of leg pain and constipation.  Denies f/c, cp, sob,  n/v/d, abd pain.  Objective: Vitals:   11/22/20 1817 11/22/20 2107 11/23/20 0338 11/23/20 0519  BP: (!) 163/59 (!) 159/55 (!) 146/67 (!) 162/67  Pulse: 63 62 (!) 54 (!) 59  Resp: '18 18 18 18  '$ Temp:  98.3 F (36.8  C) 98 F (36.7 C) 98.3 F (36.8 C)  TempSrc:  Oral Oral Oral  SpO2: 96% 97% 94% 94%  Weight:      Height:        Intake/Output Summary (Last 24 hours) at 11/23/2020 1159 Last data filed at 11/23/2020 0900 Gross per 24 hour  Intake 2773.28 ml  Output 500 ml  Net 2273.28 ml   Weight change:  Exam:  General:  Pt is alert, follows commands appropriately, not in acute distress HEENT: No icterus, No thrush, No neck mass, /AT Cardiovascular: RRR, S1/S2, no rubs, no gallops Respiratory: bibasilar crackles. No wheeze Abdomen: Soft/+BS, non tender, non distended, no guarding Extremities: Nonpitting edema, No lymphangitis, No petechiae, No rashes, no synovitis;  +tender  legs   Data Reviewed: I have personally reviewed following labs and imaging studies Basic Metabolic Panel: Recent Labs  Lab 11/22/20 0052 11/22/20 0533 11/23/20 0534  NA 136 139 138  K 3.7 3.9 3.6  CL 98 102 101  CO2 '30 29 31  '$ GLUCOSE 103* 100* 115*  BUN 44* 40* 23  CREATININE 1.22* 1.06* 0.87  CALCIUM 8.3* 8.4* 7.9*  MG  --   --  2.5*   Liver Function Tests: Recent Labs  Lab 11/22/20 0533 11/23/20 0534  AST 241* 210*  ALT 111* 104*  ALKPHOS 76 60  BILITOT 1.0 1.0  PROT 5.4* 4.8*  ALBUMIN 2.8* 2.4*   No results for input(s): LIPASE, AMYLASE in the last 168 hours. No results for input(s): AMMONIA in the last 168 hours. Coagulation Profile: No results for input(s): INR, PROTIME in the last 168 hours. CBC: Recent Labs  Lab 11/22/20 0052 11/22/20 0533 11/23/20 0534  WBC 21.0* 18.8* 10.6*  NEUTROABS 16.3*  --   --   HGB 14.4 13.5 12.9  HCT 42.8 41.0 40.2  MCV 98.2 98.6 100.2*  PLT 245 210 183   Cardiac Enzymes: Recent Labs  Lab 11/22/20 0052 11/22/20 0533 11/23/20 0534  CKTOTAL 10,185* 13,299* 6,204*   BNP: Invalid input(s): POCBNP CBG: Recent Labs  Lab 11/22/20 1140 11/22/20 1843 11/22/20 2215 11/23/20 0741 11/23/20 1057  GLUCAP 84 110* 192* 100* 169*   HbA1C: Recent Labs    11/22/20 0052  HGBA1C 6.7*   Urine analysis:    Component Value Date/Time   COLORURINE YELLOW 11/22/2020 [place] 11/22/2020 0053   LABSPEC 1.012 11/22/2020 0053   PHURINE 6.0 11/22/2020 0053   GLUCOSEU NEGATIVE 11/22/2020 0053   HGBUR LARGE (A) 11/22/2020 0053   BILIRUBINUR NEGATIVE 11/22/2020 0053   KETONESUR NEGATIVE 11/22/2020 0053   PROTEINUR 30 (A) 11/22/2020 0053   UROBILINOGEN 0.2 04/25/2014 1355   NITRITE NEGATIVE 11/22/2020 0053   LEUKOCYTESUR NEGATIVE 11/22/2020 0053   Sepsis Labs: '@LABRCNTIP'$ (procalcitonin:4,lacticidven:4) ) Recent Results (from the past 240 hour(s))  Resp Panel by RT-PCR (Flu A&B, Covid) Urine, Clean Catch      Status: Abnormal   Collection Time: 11/22/20 12:53 AM   Specimen: Urine, Clean Catch; Nasopharyngeal(NP) swabs in vial transport medium  Result Value Ref Range Status   SARS Coronavirus 2 by RT PCR POSITIVE (A) NEGATIVE Final    Comment: RESULT CALLED TO, READ BACK BY AND VERIFIED WITH: Spence,H'@0303'$  by Zigmund Daniel, b 8.10.22 (NOTE) SARS-CoV-2 target nucleic acids are DETECTED.  The SARS-CoV-2 RNA is generally detectable in upper respiratory specimens during the acute phase of infection. Positive results are indicative of the presence of the identified virus, but do not rule out bacterial infection or co-infection with other pathogens not  detected by the test. Clinical correlation with patient history and other diagnostic information is necessary to determine patient infection status. The expected result is Negative.  Fact Sheet for Patients: EntrepreneurPulse.com.au  Fact Sheet for Healthcare Providers: IncredibleEmployment.be  This test is not yet approved or cleared by the [place] FDA and  has been authorized for detection and/or diagnosis of SARS-CoV-2 by FDA under an Emergency Use Authorization (EUA).  This EUA will remain in effect (meaning this test can  be used) for the duration of  the COVID-19 declaration under Section 564(b)(1) of the Act, 21 U.S.C. section 360bbb-3(b)(1), unless the authorization is terminated or revoked sooner.     Influenza A by PCR NEGATIVE NEGATIVE Final   Influenza B by PCR NEGATIVE NEGATIVE Final    Comment: (NOTE) The Xpert Xpress SARS-CoV-2/FLU/RSV plus assay is intended as an aid in the diagnosis of influenza from Nasopharyngeal swab specimens and should not be used as a sole basis for treatment. Nasal washings and aspirates are unacceptable for Xpert Xpress SARS-CoV-2/FLU/RSV testing.  Fact Sheet for Patients: EntrepreneurPulse.com.au  Fact Sheet for Healthcare  Providers: IncredibleEmployment.be  This test is not yet approved or cleared by the [place] FDA and has been authorized for detection and/or diagnosis of SARS-CoV-2 by FDA under an Emergency Use Authorization (EUA). This EUA will remain in effect (meaning this test can be used) for the duration of the COVID-19 declaration under Section 564(b)(1) of the Act, 21 U.S.C. section 360bbb-3(b)(1), unless the authorization is terminated or revoked.  Performed at [facility], [address], [place], [place] [address]      Scheduled Meds:  amLODipine  2.5 mg Oral Daily   aspirin EC  81 mg Oral Daily   darifenacin  7.5 mg Oral Daily   heparin  5,000 Units Subcutaneous Q8H   insulin aspart  0-9 Units Subcutaneous TID WC   losartan  25 mg Oral Daily   metoprolol succinate  25 mg Oral Daily   nystatin   Topical TID   Continuous Infusions:  sodium chloride 50 mL/hr at 11/22/20 2203   remdesivir 100 mg in NS 100 mL 100 mg (11/23/20 0848)   sodium bicarbonate 150 mEq in D5W infusion 75 mL/hr at 11/22/20 [id number]    Procedures/Studies: DG Chest 2 View  Result Date: 11/22/2020 CLINICAL DATA:  Recent fall with chest pain, initial encounter EXAM: CHEST - 2 VIEW COMPARISON:  06/12/2013 FINDINGS: Cardiac shadow is within normal limits. Aortic calcifications are noted. Postsurgical changes are noted in the cervical spine. Lungs are well aerated bilaterally. No focal infiltrate or sizable effusion is seen. Mild scarring is noted in the left base. No bony abnormality is noted. IMPRESSION: No acute abnormality noted. Electronically Signed   By: Inez Catalina M.D.   On: 11/22/2020 02:18   DG Lumbar Spine Complete  Result Date: 11/22/2020 CLINICAL DATA:  Recent fall with low back pain, initial encounter EXAM: LUMBAR SPINE - COMPLETE 4+ VIEW COMPARISON:  05/27/2013 FINDINGS: Five lumbar type vertebral bodies are well visualized. Changes of prior fusion are noted from L2-S1 with posterior  fixation. The overall appearance is similar to that seen on the prior exam. No hardware failure is noted. Degenerative changes at L1-2 are seen. Fecal material is noted within the colon consistent with mild constipation. IMPRESSION: Degenerative change without acute abnormality. Mild colonic constipation. Electronically Signed   By: Inez Catalina M.D.   On: 11/22/2020 02:21   CT HEAD WO CONTRAST (5MM)  Result Date: 11/22/2020 CLINICAL DATA:  Found on floor. EXAM: CT HEAD WITHOUT CONTRAST TECHNIQUE: Contiguous axial images were obtained from the base of the skull through the vertex without intravenous contrast. COMPARISON:  05/27/2013 FINDINGS: Brain: Age related volume loss. No acute intracranial abnormality. Specifically, no hemorrhage, hydrocephalus, mass lesion, acute infarction, or significant intracranial injury. Vascular: No hyperdense vessel or unexpected calcification. Skull: No acute calvarial abnormality. Sinuses/Orbits: No acute findings Other: None IMPRESSION: No acute intracranial abnormality. Electronically Signed   By: Rolm Baptise M.D.   On: 11/22/2020 01:34   CT Cervical Spine Wo Contrast  Result Date: 11/22/2020 CLINICAL DATA:  Neck trauma (Age >= 65y).  Found down. EXAM: CT CERVICAL SPINE WITHOUT CONTRAST TECHNIQUE: Multidetector CT imaging of the cervical spine was performed without intravenous contrast. Multiplanar CT image reconstructions were also generated. COMPARISON:  MRI 06/24/2018 FINDINGS: Alignment: Minimal 2 mm degenerative anterolisthesis of C2 on C3. Skull base and vertebrae: No acute fracture. No primary bone lesion or focal pathologic process. Soft tissues and spinal canal: No prevertebral fluid or swelling. No visible canal hematoma. Disc levels: Anterior fusion from C3-C6. Diffuse advanced degenerative disc and facet disease. Upper chest: No acute findings Other: None IMPRESSION: Anterior fusion changes from C3-C6. Diffuse degenerative disc and facet disease. No acute bony  abnormality. Electronically Signed   By: Rolm Baptise M.D.   On: 11/22/2020 01:34   US Venous Img Lower Bilateral (DVT)  Result Date: 11/22/2020 CLINICAL DATA:  Bilateral lower extremity pain and edema worse on the right EXAM: BILATERAL LOWER EXTREMITY VENOUS DOPPLER ULTRASOUND TECHNIQUE: Gray-scale sonography with graded compression, as well as color Doppler and duplex ultrasound were performed to evaluate the lower extremity deep venous systems from the level of the common femoral vein and including the common femoral, femoral, profunda femoral, popliteal and calf veins including the posterior tibial, peroneal and gastrocnemius veins when visible. The superficial great saphenous vein was also interrogated. Spectral Doppler was utilized to evaluate flow at rest and with distal augmentation maneuvers in the common femoral, femoral and popliteal veins. COMPARISON:  None. FINDINGS: RIGHT LOWER EXTREMITY Common Femoral Vein: No evidence of thrombus. Normal compressibility, respiratory phasicity and response to augmentation. Saphenofemoral Junction: No evidence of thrombus. Normal compressibility and flow on color Doppler imaging. Profunda Femoral Vein: No evidence of thrombus. Normal compressibility and flow on color Doppler imaging. Femoral Vein: No evidence of thrombus. Normal compressibility, respiratory phasicity and response to augmentation. Popliteal Vein: Hypoechoic intraluminal thrombus appearing occlusive. Vessel noncompressible. Phasic flow not demonstrated. Augmentation not performed Calf Veins: Thrombus does appear to extend into the calf tibial and peroneal veins. LEFT LOWER EXTREMITY Common Femoral Vein: No evidence of thrombus. Normal compressibility, respiratory phasicity and response to augmentation. Saphenofemoral Junction: No evidence of thrombus. Normal compressibility and flow on color Doppler imaging. Profunda Femoral Vein: No evidence of thrombus. Normal compressibility and flow on color  Doppler imaging. Femoral Vein: No evidence of thrombus. Normal compressibility, respiratory phasicity and response to augmentation. Popliteal Vein: Hypoechoic intraluminal thrombus. Thrombus appears occlusive. Vessel noncompressible. No phasic flow. Augmentation not performed. Calf Veins: Thrombus does appear to extend into the left calf tibial and peroneal veins. IMPRESSION: Positive exam for bilateral popliteal DVT with extension into the calf tibial and peroneal veins Electronically Signed   By: Jerilynn Mages.  Shick M.D.   On: 11/22/2020 11:45   DG Knee Complete 4 Views Left  Result Date: 11/22/2020 CLINICAL DATA:  Recent fall with left knee pain, initial encounter EXAM: LEFT KNEE - COMPLETE 4+ VIEW COMPARISON:  07/04/2018 FINDINGS: Left  knee prosthesis is noted in satisfactory position. No acute fracture or dislocation is noted. No joint effusion is seen. Vascular calcifications are seen. IMPRESSION: No acute abnormality noted. Electronically Signed   By: Inez Catalina M.D.   On: 11/22/2020 02:18   DG Knee Complete 4 Views Right  Result Date: 11/22/2020 CLINICAL DATA:  Recent fall with knee pain, initial encounter EXAM: RIGHT KNEE - COMPLETE 4+ VIEW COMPARISON:  11/29/2011 FINDINGS: Right knee prosthesis is noted. No joint effusion is seen. No acute fracture or dislocation is seen. Vascular calcifications are noted. IMPRESSION: No acute abnormality noted. Electronically Signed   By: Inez Catalina M.D.   On: 11/22/2020 02:20   US Abdomen Limited RUQ (LIVER/GB)  Result Date: 11/22/2020 CLINICAL DATA:  Abnormal LFTs EXAM: ULTRASOUND ABDOMEN LIMITED RIGHT UPPER QUADRANT COMPARISON:  CT abdomen/pelvis 05/27/2013 FINDINGS: Gallbladder: The gallbladder is surgically absent. Common bile duct: Diameter: 5 mm Liver: The liver demonstrates diffusely coarsened echotexture with a nodular surface contour. There is a geographic region of increased echogenicity in the left hepatic lobe measuring 3.6 cm x 1.9 cm x 3.0 cm. Portal  vein is patent on color Doppler imaging with normal direction of blood flow towards the liver. Other: None. IMPRESSION: 1. Coarsened echotexture and nodular surface contour of the liver suggesting mild cirrhosis. 2. Geographic region of increased echogenicity in the left hepatic lobe may be related to focal fatty change; however, dedicated nonemergent liver protocol MRI is recommended to exclude underlying mass lesion. Electronically Signed   By: Valetta Mole MD   On: 11/22/2020 12:00    Orson Eva, DO  [facility]  If 7PM-7AM, please contact night-coverage [email or web address] Password [email or web address] 11/23/2020, 11:59 AM   LOS: 1 day

## 2020-11-23 NOTE — Discharge Instructions (Signed)
Information on my medicine - ELIQUIS (apixaban)  This medication education was reviewed with me or my healthcare representative as part of my discharge preparation.     Why was Eliquis prescribed for you? Eliquis was prescribed to treat blood clots that may have been found in the veins of your legs (deep vein thrombosis) or in your lungs (pulmonary embolism) and to reduce the risk of them occurring again.  What do You need to know about Eliquis ? The starting dose is 10 mg (two 5 mg tablets) taken TWICE daily for the FIRST SEVEN (7) DAYS, then on (enter date)  11/30/2020  the dose is reduced to ONE 5 mg tablet taken TWICE daily.  Eliquis may be taken with or without food.   Try to take the dose about the same time in the morning and in the evening. If you have difficulty swallowing the tablet whole please discuss with your pharmacist how to take the medication safely.  Take Eliquis exactly as prescribed and DO NOT stop taking Eliquis without talking to the doctor who prescribed the medication.  Stopping may increase your risk of developing a new blood clot.  Refill your prescription before you run out.  After discharge, you should have regular check-up appointments with your healthcare provider that is prescribing your Eliquis.    What do you do if you miss a dose? If a dose of ELIQUIS is not taken at the scheduled time, take it as soon as possible on the same day and twice-daily administration should be resumed. The dose should not be doubled to make up for a missed dose.  Important Safety Information A possible side effect of Eliquis is bleeding. You should call your healthcare provider right away if you experience any of the following: Bleeding from an injury or your nose that does not stop. Unusual colored urine (red or dark brown) or unusual colored stools (red or black). Unusual bruising for unknown reasons. A serious fall or if you hit your head (even if there is no  bleeding).  Some medicines may interact with Eliquis and might increase your risk of bleeding or clotting while on Eliquis. To help avoid this, consult your healthcare provider or pharmacist prior to using any new prescription or non-prescription medications, including herbals, vitamins, non-steroidal anti-inflammatory drugs (NSAIDs) and supplements.  This website has more information on Eliquis (apixaban): http://www.eliquis.com/eliquis/home

## 2020-11-24 LAB — CBC
HCT: 36.7 % (ref 36.0–46.0)
Hemoglobin: 11.8 g/dL — ABNORMAL LOW (ref 12.0–15.0)
MCH: 32.2 pg (ref 26.0–34.0)
MCHC: 32.2 g/dL (ref 30.0–36.0)
MCV: 100.3 fL — ABNORMAL HIGH (ref 80.0–100.0)
Platelets: 169 10*3/uL (ref 150–400)
RBC: 3.66 MIL/uL — ABNORMAL LOW (ref 3.87–5.11)
RDW: 13.4 % (ref 11.5–15.5)
WBC: 9.1 10*3/uL (ref 4.0–10.5)
nRBC: 0 % (ref 0.0–0.2)

## 2020-11-24 LAB — COMPREHENSIVE METABOLIC PANEL
ALT: 97 U/L — ABNORMAL HIGH (ref 0–44)
AST: 163 U/L — ABNORMAL HIGH (ref 15–41)
Albumin: 2.2 g/dL — ABNORMAL LOW (ref 3.5–5.0)
Alkaline Phosphatase: 57 U/L (ref 38–126)
Anion gap: 6 (ref 5–15)
BUN: 20 mg/dL (ref 8–23)
CO2: 34 mmol/L — ABNORMAL HIGH (ref 22–32)
Calcium: 7.8 mg/dL — ABNORMAL LOW (ref 8.9–10.3)
Chloride: 101 mmol/L (ref 98–111)
Creatinine, Ser: 0.78 mg/dL (ref 0.44–1.00)
GFR, Estimated: >60 mL/min (ref >60)
Glucose, Bld: 113 mg/dL — ABNORMAL HIGH (ref 70–99)
Potassium: 3.4 mmol/L — ABNORMAL LOW (ref 3.5–5.1)
Sodium: 141 mmol/L (ref 135–145)
Total Bilirubin: 0.8 mg/dL (ref 0.3–1.2)
Total Protein: 4.4 g/dL — ABNORMAL LOW (ref 6.5–8.1)

## 2020-11-24 LAB — C-REACTIVE PROTEIN: CRP: 2.9 mg/dL — ABNORMAL HIGH (ref <1.0)

## 2020-11-24 LAB — GLUCOSE, CAPILLARY
Glucose-Capillary: 104 mg/dL — ABNORMAL HIGH (ref 70–99)
Glucose-Capillary: 159 mg/dL — ABNORMAL HIGH (ref 70–99)
Glucose-Capillary: 114 mg/dL — ABNORMAL HIGH (ref 70–99)
Glucose-Capillary: 172 mg/dL — ABNORMAL HIGH (ref 70–99)

## 2020-11-24 LAB — CK: Total CK: 3532 U/L — ABNORMAL HIGH (ref 38–234)

## 2020-11-24 LAB — D-DIMER, QUANTITATIVE: D-Dimer, Quant: 2.52 ug/mL-FEU — ABNORMAL HIGH (ref 0.00–0.50)

## 2020-11-24 MED ORDER — VALACYCLOVIR HCL 500 MG PO TABS
1000.0000 mg | ORAL_TABLET | Freq: Three times a day (TID) | ORAL | Status: DC
  Administered 2020-11-24 – 2020-11-27 (×9): 1000 mg via ORAL
  Filled 2020-11-24 (×9): qty 2

## 2020-11-24 MED ORDER — LORAZEPAM 2 MG/ML IJ SOLN
1.0000 mg | Freq: Once | INTRAMUSCULAR | Status: AC
  Administered 2020-11-24: 1 mg via INTRAVENOUS
  Filled 2020-11-24: qty 1

## 2020-11-24 MED ORDER — LACTULOSE 10 GM/15ML PO SOLN
30.0000 g | Freq: Two times a day (BID) | ORAL | Status: DC
  Administered 2020-11-24 – 2020-11-26 (×4): 30 g via ORAL
  Filled 2020-11-24 (×5): qty 60

## 2020-11-24 MED ORDER — POTASSIUM CHLORIDE CRYS ER 20 MEQ PO TBCR
20.0000 meq | EXTENDED_RELEASE_TABLET | Freq: Once | ORAL | Status: AC
  Administered 2020-11-24: 20 meq via ORAL
  Filled 2020-11-24: qty 1

## 2020-11-24 MED ORDER — BISACODYL 10 MG RE SUPP
10.0000 mg | Freq: Once | RECTAL | Status: AC
  Administered 2020-11-24: 10 mg via RECTAL
  Filled 2020-11-24: qty 1

## 2020-11-24 NOTE — Progress Notes (Signed)
PROGRESS NOTE  Leslie Hendricks [id number] DOB: [birth year] DOA: 11/22/2020 PCP: Curly Rim, MD      Brief History:    80 y.o. female, with history of anemia, DMII, frequent falls, vision loss 2/2 macular degeneration, GERD, headache, HLD, HTN, and more presents to the ED with a chief complaint of fall. Patient reports that she fell in her closet. She was walking along when her legs became so weak, they just gave out from under her. She reports that she hit under her chin, but did not hit her head, did not lose consciousness. She had no preceding chest pain, dizziness, or dyspnea. She reports that she has been getting progressively weaker since she was diagnosed with covid with a positive home test on 11/16/20. Patient was not able to get up on her own. Her sister called EMS upon discovering that patient was on the floor, and the paramedics had to help get her up. She thinks she was down approx 4 hours.  She has had palpitations when she is coughing for the last 3 days. Patient reports chronic headaches that are better with tylenol. She has no change in vision or hearing. Patient has not had dyspnea. She reports that she does have weakness greater on the right than the left, but that this has been present for 4-5 years. She denies cp, sob, n/v/d, abd pain.  She does not smoke, does not drink, and does not use illicit drugs. Patient is DNR.   Assessment/Plan: Rhabdomyolysis -CPK peaking 13,299>>6204>>3532 -Continue bicarbonate drip -Holding Lipitor   COVID-19 infection -Stable on room air with saturation 94-95% -Started remdesivir -CRP 0.6>>2.9>>2.9 -Ferritin 159 -D-dimer 6.98>>3.93>>2.52   Essential hypertension -Continue amlodipine and losartan -Continue metoprolol succinate   CKD stage IIIa -Baseline creatinine 0.9-1.2 -Monitor BMP   Transaminasemia -Secondary COVID-19 infection and rhabdomyolysis -RUQ US--Coarsened echotexture and nodular surface contour of  the liver suggesting mild cirrhosis;  Geographic region of increased echogenicity in the left hepatic lobe may be related to focal fatty change-->outpt MR liver -improving   Depression/anxiety -Continue trazodone and Effexor   Acute Leg DVTs -11/22/20 venous duplex--for bilateral popliteal DVT with extension into the calf tibial and peroneal veins -start apixaban   Constipation -start miralax and senna -restart linzess -add bisacodyl supp  Hypokalemia -replete   Right Flank Rash -?injury from fall vs early Herpes Zoster -start valtrex       Status is: Inpatient   Remains inpatient appropriate because:Inpatient level of care appropriate due to severity of illness   Dispo: The patient is from: Home              Anticipated d/c is to: Home              Patient currently is not medically stable to d/c.              Difficult to place patient No               Family Communication:   sister updated 8/12   Consultants:  none   Code Status:  FULL   DVT Prophylaxis:  apixaban     Procedures: As Listed in Progress Note Above   Antibiotics: None  Subjective: Patient complains of constipation--requests something stronger for BM.  Denies cp, sob, abd pain, dysuria, f/c, headache .  No n/v/d  Objective: Vitals:   11/23/20 0519 11/23/20 1442 11/23/20 2053 11/24/20 0443  BP: (!) 162/67 (!) 149/46 Marland Kitchen)  155/48 (!) 154/61  Pulse: (!) 59 (!) 58 (!) 57 (!) 56  Resp: '18  20 18  '$ Temp: 98.3 F (36.8 C) 98.5 F (36.9 C) 98.2 F (36.8 C) 98.1 F (36.7 C)  TempSrc: Oral Oral    SpO2: 94% 96% 96% 97%  Weight:      Height:        Intake/Output Summary (Last 24 hours) at 11/24/2020 1222 Last data filed at 11/24/2020 0500 Gross per 24 hour  Intake 3635.25 ml  Output 900 ml  Net 2735.25 ml   Weight change:  Exam:  General:  Pt is alert, follows commands appropriately, not in acute distress HEENT: No icterus, No thrush, No neck mass, [place]/AT Cardiovascular: RRR, S1/S2,  no rubs, no gallops Respiratory: bibasilar rales. No wheeze Abdomen: Soft/+BS, non tender, non distended, no guarding Extremities: No edema, No lymphangitis, No petechiae, No rashes, no synovitis;  R-flank with painful maculopapular area   Data Reviewed: I have personally reviewed following labs and imaging studies Basic Metabolic Panel: Recent Labs  Lab 11/22/20 0052 11/22/20 0533 11/23/20 0534 11/24/20 0439  NA 136 139 138 141  K 3.7 3.9 3.6 3.4*  CL 98 102 101 101  CO2 '30 29 31 '$ 34*  GLUCOSE 103* 100* 115* 113*  BUN 44* 40* 23 20  CREATININE 1.22* 1.06* 0.87 0.78  CALCIUM 8.3* 8.4* 7.9* 7.8*  MG  --   --  2.5*  --    Liver Function Tests: Recent Labs  Lab 11/22/20 0533 11/23/20 0534 11/24/20 0439  AST 241* 210* 163*  ALT 111* 104* 97*  ALKPHOS 76 60 57  BILITOT 1.0 1.0 0.8  PROT 5.4* 4.8* 4.4*  ALBUMIN 2.8* 2.4* 2.2*   No results for input(s): LIPASE, AMYLASE in the last 168 hours. No results for input(s): AMMONIA in the last 168 hours. Coagulation Profile: No results for input(s): INR, PROTIME in the last 168 hours. CBC: Recent Labs  Lab 11/22/20 0052 11/22/20 0533 11/23/20 0534 11/24/20 0439  WBC 21.0* 18.8* 10.6* 9.1  NEUTROABS 16.3*  --   --   --   HGB 14.4 13.5 12.9 11.8*  HCT 42.8 41.0 40.2 36.7  MCV 98.2 98.6 100.2* 100.3*  PLT 245 210 183 169   Cardiac Enzymes: Recent Labs  Lab 11/22/20 0052 11/22/20 0533 11/23/20 0534 11/24/20 0439  CKTOTAL 10,185* 13,299* 6,204* 3,532*   BNP: Invalid input(s): POCBNP CBG: Recent Labs  Lab 11/23/20 1057 11/23/20 1604 11/23/20 2054 11/24/20 0750 11/24/20 1110  GLUCAP 169* 80 205* 104* 159*   HbA1C: Recent Labs    11/22/20 0052  HGBA1C 6.7*   Urine analysis:    Component Value Date/Time   COLORURINE YELLOW 11/22/2020 0053   APPEARANCEUR CLEAR 11/22/2020 0053   LABSPEC 1.012 11/22/2020 0053   PHURINE 6.0 11/22/2020 0053   GLUCOSEU NEGATIVE 11/22/2020 0053   HGBUR LARGE (A) 11/22/2020  0053   BILIRUBINUR NEGATIVE 11/22/2020 0053   KETONESUR NEGATIVE 11/22/2020 0053   PROTEINUR 30 (A) 11/22/2020 0053   UROBILINOGEN 0.2 04/25/2014 1355   NITRITE NEGATIVE 11/22/2020 0053   LEUKOCYTESUR NEGATIVE 11/22/2020 0053   Sepsis Labs: '@LABRCNTIP'$ (procalcitonin:4,lacticidven:4) ) Recent Results (from the past 240 hour(s))  Resp Panel by RT-PCR (Flu A&B, Covid) Urine, Clean Catch     Status: Abnormal   Collection Time: 11/22/20 12:53 AM   Specimen: Urine, Clean Catch; Nasopharyngeal(NP) swabs in vial transport medium  Result Value Ref Range Status   SARS Coronavirus 2 by RT PCR POSITIVE (A) NEGATIVE Final  Comment: RESULT CALLED TO, READ BACK BY AND VERIFIED WITH: Spence,H'@0303'$  by Zigmund Daniel, b 8.10.22 (NOTE) SARS-CoV-2 target nucleic acids are DETECTED.  The SARS-CoV-2 RNA is generally detectable in upper respiratory specimens during the acute phase of infection. Positive results are indicative of the presence of the identified virus, but do not rule out bacterial infection or co-infection with other pathogens not detected by the test. Clinical correlation with patient history and other diagnostic information is necessary to determine patient infection status. The expected result is Negative.  Fact Sheet for Patients: EntrepreneurPulse.com.au  Fact Sheet for Healthcare Providers: IncredibleEmployment.be  This test is not yet approved or cleared by the [place] FDA and  has been authorized for detection and/or diagnosis of SARS-CoV-2 by FDA under an Emergency Use Authorization (EUA).  This EUA will remain in effect (meaning this test can  be used) for the duration of  the COVID-19 declaration under Section 564(b)(1) of the Act, 21 U.S.C. section 360bbb-3(b)(1), unless the authorization is terminated or revoked sooner.     Influenza A by PCR NEGATIVE NEGATIVE Final   Influenza B by PCR NEGATIVE NEGATIVE Final    Comment:  (NOTE) The Xpert Xpress SARS-CoV-2/FLU/RSV plus assay is intended as an aid in the diagnosis of influenza from Nasopharyngeal swab specimens and should not be used as a sole basis for treatment. Nasal washings and aspirates are unacceptable for Xpert Xpress SARS-CoV-2/FLU/RSV testing.  Fact Sheet for Patients: EntrepreneurPulse.com.au  Fact Sheet for Healthcare Providers: IncredibleEmployment.be  This test is not yet approved or cleared by the [place] FDA and has been authorized for detection and/or diagnosis of SARS-CoV-2 by FDA under an Emergency Use Authorization (EUA). This EUA will remain in effect (meaning this test can be used) for the duration of the COVID-19 declaration under Section 564(b)(1) of the Act, 21 U.S.C. section 360bbb-3(b)(1), unless the authorization is terminated or revoked.  Performed at [facility], [address], [place], [place] [address]      Scheduled Meds:  amLODipine  2.5 mg Oral Daily   apixaban  10 mg Oral BID   Followed by   Derrill Memo ON 11/30/2020] apixaban  5 mg Oral BID   darifenacin  7.5 mg Oral Daily   insulin aspart  0-9 Units Subcutaneous TID WC   linaclotide  72 mcg Oral QAC breakfast   losartan  25 mg Oral Daily   metoprolol succinate  25 mg Oral Daily   nystatin   Topical TID   polyethylene glycol  17 g Oral Daily   senna  2 tablet Oral Daily   traZODone  100 mg Oral QHS   Continuous Infusions:  sodium chloride 50 mL/hr at 11/23/20 1936   remdesivir 100 mg in NS 100 mL 100 mg (11/24/20 0913)   sodium bicarbonate 150 mEq in D5W infusion 75 mL/hr at 11/24/20 0300    Procedures/Studies: DG Chest 2 View  Result Date: 11/22/2020 CLINICAL DATA:  Recent fall with chest pain, initial encounter EXAM: CHEST - 2 VIEW COMPARISON:  06/12/2013 FINDINGS: Cardiac shadow is within normal limits. Aortic calcifications are noted. Postsurgical changes are noted in the cervical spine. Lungs are well aerated  bilaterally. No focal infiltrate or sizable effusion is seen. Mild scarring is noted in the left base. No bony abnormality is noted. IMPRESSION: No acute abnormality noted. Electronically Signed   By: Inez Catalina M.D.   On: 11/22/2020 02:18   DG Lumbar Spine Complete  Result Date: 11/22/2020 CLINICAL DATA:  Recent fall with low back  pain, initial encounter EXAM: LUMBAR SPINE - COMPLETE 4+ VIEW COMPARISON:  05/27/2013 FINDINGS: Five lumbar type vertebral bodies are well visualized. Changes of prior fusion are noted from L2-S1 with posterior fixation. The overall appearance is similar to that seen on the prior exam. No hardware failure is noted. Degenerative changes at L1-2 are seen. Fecal material is noted within the colon consistent with mild constipation. IMPRESSION: Degenerative change without acute abnormality. Mild colonic constipation. Electronically Signed   By: Inez Catalina M.D.   On: 11/22/2020 02:21   CT HEAD WO CONTRAST (5MM)  Result Date: 11/22/2020 CLINICAL DATA:  Found on floor. EXAM: CT HEAD WITHOUT CONTRAST TECHNIQUE: Contiguous axial images were obtained from the base of the skull through the vertex without intravenous contrast. COMPARISON:  05/27/2013 FINDINGS: Brain: Age related volume loss. No acute intracranial abnormality. Specifically, no hemorrhage, hydrocephalus, mass lesion, acute infarction, or significant intracranial injury. Vascular: No hyperdense vessel or unexpected calcification. Skull: No acute calvarial abnormality. Sinuses/Orbits: No acute findings Other: None IMPRESSION: No acute intracranial abnormality. Electronically Signed   By: Rolm Baptise M.D.   On: 11/22/2020 01:34   CT Cervical Spine Wo Contrast  Result Date: 11/22/2020 CLINICAL DATA:  Neck trauma (Age >= 65y).  Found down. EXAM: CT CERVICAL SPINE WITHOUT CONTRAST TECHNIQUE: Multidetector CT imaging of the cervical spine was performed without intravenous contrast. Multiplanar CT image reconstructions were  also generated. COMPARISON:  MRI 06/24/2018 FINDINGS: Alignment: Minimal 2 mm degenerative anterolisthesis of C2 on C3. Skull base and vertebrae: No acute fracture. No primary bone lesion or focal pathologic process. Soft tissues and spinal canal: No prevertebral fluid or swelling. No visible canal hematoma. Disc levels: Anterior fusion from C3-C6. Diffuse advanced degenerative disc and facet disease. Upper chest: No acute findings Other: None IMPRESSION: Anterior fusion changes from C3-C6. Diffuse degenerative disc and facet disease. No acute bony abnormality. Electronically Signed   By: Rolm Baptise M.D.   On: 11/22/2020 01:34   US Venous Img Lower Bilateral (DVT)  Result Date: 11/22/2020 CLINICAL DATA:  Bilateral lower extremity pain and edema worse on the right EXAM: BILATERAL LOWER EXTREMITY VENOUS DOPPLER ULTRASOUND TECHNIQUE: Gray-scale sonography with graded compression, as well as color Doppler and duplex ultrasound were performed to evaluate the lower extremity deep venous systems from the level of the common femoral vein and including the common femoral, femoral, profunda femoral, popliteal and calf veins including the posterior tibial, peroneal and gastrocnemius veins when visible. The superficial great saphenous vein was also interrogated. Spectral Doppler was utilized to evaluate flow at rest and with distal augmentation maneuvers in the common femoral, femoral and popliteal veins. COMPARISON:  None. FINDINGS: RIGHT LOWER EXTREMITY Common Femoral Vein: No evidence of thrombus. Normal compressibility, respiratory phasicity and response to augmentation. Saphenofemoral Junction: No evidence of thrombus. Normal compressibility and flow on color Doppler imaging. Profunda Femoral Vein: No evidence of thrombus. Normal compressibility and flow on color Doppler imaging. Femoral Vein: No evidence of thrombus. Normal compressibility, respiratory phasicity and response to augmentation. Popliteal Vein:  Hypoechoic intraluminal thrombus appearing occlusive. Vessel noncompressible. Phasic flow not demonstrated. Augmentation not performed Calf Veins: Thrombus does appear to extend into the calf tibial and peroneal veins. LEFT LOWER EXTREMITY Common Femoral Vein: No evidence of thrombus. Normal compressibility, respiratory phasicity and response to augmentation. Saphenofemoral Junction: No evidence of thrombus. Normal compressibility and flow on color Doppler imaging. Profunda Femoral Vein: No evidence of thrombus. Normal compressibility and flow on color Doppler imaging. Femoral Vein: No evidence of thrombus.  Normal compressibility, respiratory phasicity and response to augmentation. Popliteal Vein: Hypoechoic intraluminal thrombus. Thrombus appears occlusive. Vessel noncompressible. No phasic flow. Augmentation not performed. Calf Veins: Thrombus does appear to extend into the left calf tibial and peroneal veins. IMPRESSION: Positive exam for bilateral popliteal DVT with extension into the calf tibial and peroneal veins Electronically Signed   By: Jerilynn Mages.  Shick M.D.   On: 11/22/2020 11:45   DG Knee Complete 4 Views Left  Result Date: 11/22/2020 CLINICAL DATA:  Recent fall with left knee pain, initial encounter EXAM: LEFT KNEE - COMPLETE 4+ VIEW COMPARISON:  07/04/2018 FINDINGS: Left knee prosthesis is noted in satisfactory position. No acute fracture or dislocation is noted. No joint effusion is seen. Vascular calcifications are seen. IMPRESSION: No acute abnormality noted. Electronically Signed   By: Inez Catalina M.D.   On: 11/22/2020 02:18   DG Knee Complete 4 Views Right  Result Date: 11/22/2020 CLINICAL DATA:  Recent fall with knee pain, initial encounter EXAM: RIGHT KNEE - COMPLETE 4+ VIEW COMPARISON:  11/29/2011 FINDINGS: Right knee prosthesis is noted. No joint effusion is seen. No acute fracture or dislocation is seen. Vascular calcifications are noted. IMPRESSION: No acute abnormality noted.  Electronically Signed   By: Inez Catalina M.D.   On: 11/22/2020 02:20   US Abdomen Limited RUQ (LIVER/GB)  Result Date: 11/22/2020 CLINICAL DATA:  Abnormal LFTs EXAM: ULTRASOUND ABDOMEN LIMITED RIGHT UPPER QUADRANT COMPARISON:  CT abdomen/pelvis 05/27/2013 FINDINGS: Gallbladder: The gallbladder is surgically absent. Common bile duct: Diameter: 5 mm Liver: The liver demonstrates diffusely coarsened echotexture with a nodular surface contour. There is a geographic region of increased echogenicity in the left hepatic lobe measuring 3.6 cm x 1.9 cm x 3.0 cm. Portal vein is patent on color Doppler imaging with normal direction of blood flow towards the liver. Other: None. IMPRESSION: 1. Coarsened echotexture and nodular surface contour of the liver suggesting mild cirrhosis. 2. Geographic region of increased echogenicity in the left hepatic lobe may be related to focal fatty change; however, dedicated nonemergent liver protocol MRI is recommended to exclude underlying mass lesion. Electronically Signed   By: Valetta Mole MD   On: 11/22/2020 12:00    Orson Eva, DO  [facility]  If 7PM-7AM, please contact night-coverage [email or web address] Password [email or web address] 11/24/2020, 12:22 PM   LOS: 2 days

## 2020-11-24 NOTE — TOC Progression Note (Addendum)
Transition of Care [facility]) - Progression Note    Patient Details  Name: Leslie Hendricks MRN: [id number] Date of Birth: 04/29/1940  [facility]) CM/SW Contact  [name], RN Phone Number: 11/24/2020, 11:42 AM  [facility] will make a bed offer, patients first choice, however they can not take her til after day 62 of [place].  She was positive on 8/10. She is not medically ready, TOC  to follow for discharge plan.  Addendum: DC plan for Monday. Patient sister has agreed to take her home until she can admit to [facility]. TOC spoke with [name]. Patient can admit on 8/22. They have an FL2 and will look for DC summary when completed.  MD is order [facility] RN/SW to follow.   Expected Discharge Plan: Skilled Nursing Facility Barriers to Discharge: Continued Medical Work up

## 2020-11-24 NOTE — Care Management Important Message (Signed)
Important Message  Patient Details  Name: Leslie Hendricks MRN: [id number] Date of Birth: [birth year]   Medicare Important Message Given:  Yes - Important Message mailed due to current National Emergency     Tommy Medal 11/24/2020, 4:57 PM

## 2020-11-24 NOTE — TOC Progression Note (Signed)
Transition of Care [facility]) - Progression Note    Patient Details  Name: Leslie Hendricks MRN: [id number] Date of Birth: June 05, 1940  [facility]) CM/SW Contact  [name], RN Phone Number: 11/24/2020, 3:01 PM  Clinical Narrative:  Vaughan Basta [facility] the [facility] for RN/PT. Add to AVS.   Expected Discharge Plan: [place] Barriers to Discharge: Continued Medical Work up  Expected Discharge Plan and Services Expected Discharge Plan: [place] In-house Referral: Clinical Social Work    Living arrangements for the past 2 months: [place]: RN, Social Work [employer] Agency: Microbiologist ([place]) Date [place]: 11/24/20 Time [place]: 1500 Representative spoke with at [place]: Romualdo Bolk   Readmission Risk Interventions Readmission Risk Prevention Plan 11/24/2020 11/22/2020  Transportation Screening Complete Complete  PCP or Specialist Appt within 3-5 Days Not Complete -  [place] or [place] Complete Complete  Social Work Consult for [place] Planning/Counseling Complete Complete  Palliative Care Screening Not Applicable Not Applicable  Medication Review [employer] photographer) Complete Complete  Some recent data might be hidden

## 2020-11-25 LAB — CBC
HCT: 35.5 % — ABNORMAL LOW (ref 36.0–46.0)
Hemoglobin: 11.4 g/dL — ABNORMAL LOW (ref 12.0–15.0)
MCH: 32.9 pg (ref 26.0–34.0)
MCHC: 32.1 g/dL (ref 30.0–36.0)
MCV: 102.3 fL — ABNORMAL HIGH (ref 80.0–100.0)
Platelets: 181 10*3/uL (ref 150–400)
RBC: 3.47 MIL/uL — ABNORMAL LOW (ref 3.87–5.11)
RDW: 13.7 % (ref 11.5–15.5)
WBC: 10.2 10*3/uL (ref 4.0–10.5)
nRBC: 0 % (ref 0.0–0.2)

## 2020-11-25 LAB — GLUCOSE, CAPILLARY
Glucose-Capillary: 114 mg/dL — ABNORMAL HIGH (ref 70–99)
Glucose-Capillary: 192 mg/dL — ABNORMAL HIGH (ref 70–99)
Glucose-Capillary: 135 mg/dL — ABNORMAL HIGH (ref 70–99)
Glucose-Capillary: 148 mg/dL — ABNORMAL HIGH (ref 70–99)

## 2020-11-25 LAB — COMPREHENSIVE METABOLIC PANEL
ALT: 94 U/L — ABNORMAL HIGH (ref 0–44)
AST: 129 U/L — ABNORMAL HIGH (ref 15–41)
Albumin: 2.2 g/dL — ABNORMAL LOW (ref 3.5–5.0)
Alkaline Phosphatase: 57 U/L (ref 38–126)
Anion gap: 6 (ref 5–15)
BUN: 20 mg/dL (ref 8–23)
CO2: 36 mmol/L — ABNORMAL HIGH (ref 22–32)
Calcium: 8 mg/dL — ABNORMAL LOW (ref 8.9–10.3)
Chloride: 99 mmol/L (ref 98–111)
Creatinine, Ser: 0.82 mg/dL (ref 0.44–1.00)
GFR, Estimated: >60 mL/min (ref >60)
Glucose, Bld: 113 mg/dL — ABNORMAL HIGH (ref 70–99)
Potassium: 3.8 mmol/L (ref 3.5–5.1)
Sodium: 141 mmol/L (ref 135–145)
Total Bilirubin: 0.9 mg/dL (ref 0.3–1.2)
Total Protein: 4.5 g/dL — ABNORMAL LOW (ref 6.5–8.1)

## 2020-11-25 LAB — C-REACTIVE PROTEIN: CRP: 2.6 mg/dL — ABNORMAL HIGH (ref <1.0)

## 2020-11-25 LAB — CK: Total CK: 1977 U/L — ABNORMAL HIGH (ref 38–234)

## 2020-11-25 LAB — D-DIMER, QUANTITATIVE: D-Dimer, Quant: 2.18 ug/mL-FEU — ABNORMAL HIGH (ref 0.00–0.50)

## 2020-11-25 NOTE — Progress Notes (Signed)
PROGRESS NOTE  Leslie Hendricks [id number] DOB: April 19, 1940 DOA: 11/22/2020 PCP: Curly Rim, MD  Brief History:    80 y.o. female, with history of anemia, DMII, frequent falls, vision loss 2/2 macular degeneration, GERD, headache, HLD, HTN, and more presents to the ED with a chief complaint of fall. Patient reports that she fell in her closet. She was walking along when her legs became so weak, they just gave out from under her. She reports that she hit under her chin, but did not hit her head, did not lose consciousness. She had no preceding chest pain, dizziness, or dyspnea. She reports that she has been getting progressively weaker since she was diagnosed with covid with a positive home test on 11/16/20. Patient was not able to get up on her own. Her sister called EMS upon discovering that patient was on the floor, and the paramedics had to help get her up. She thinks she was down approx 4 hours.  She has had palpitations when she is coughing for the last 3 days. Patient reports chronic headaches that are better with tylenol. She has no change in vision or hearing. Patient has not had dyspnea. She reports that she does have weakness greater on the right than the left, but that this has been present for 4-5 years. She denies cp, sob, n/v/d, abd pain.  She does not smoke, does not drink, and does not use illicit drugs. Patient is DNR.   Assessment/Plan: Rhabdomyolysis -CPK peaking 13,299>>6204>>3532>>1977 -initiated bicarbonate drip>>d/c on 8/13 -continue NS -Holding Lipitor   COVID-19 infection -Stable on room air with saturation 94-95% -Started remdesivir -CRP 0.6>>2.9>>2.9>>2.6 -Ferritin 159 -D-dimer 6.98>>3.93>>2.52>>2.18   Essential hypertension -Continue amlodipine and losartan -Continue metoprolol succinate   CKD stage IIIa -Baseline creatinine 0.9-1.2 -Monitor BMP   Transaminasemia -Secondary COVID-19 infection and rhabdomyolysis -RUQ US--Coarsened  echotexture and nodular surface contour of the liver suggesting mild cirrhosis;  Geographic region of increased echogenicity in the left hepatic lobe may be related to focal fatty change-->outpt MR liver -improving   Depression/anxiety -Continue trazodone and Effexor   Acute Leg DVTs -11/22/20 venous duplex--for bilateral popliteal DVT with extension into the calf tibial and peroneal veins -started apixaban   Constipation -start miralax and senna -restart linzess -add bisacodyl supp -added lactulose   Hypokalemia -replete   Right Flank Rash -?injury from fall vs early Herpes Zoster -start valtrex 8/12       Status is: Inpatient   Remains inpatient appropriate because:Inpatient level of care appropriate due to severity of illness   Dispo: The patient is from: Home              Anticipated d/c is to: Home              Patient currently is not medically stable to d/c.              Difficult to place patient No               Family Communication:   sister updated 8/12   Consultants:  none   Code Status:  FULL   DVT Prophylaxis:  apixaban     Procedures: As Listed in Progress Note Above   Antibiotics: None    Subjective: Patient had 2 BMs yesterday.  Had to be disimpacted.  Now feeling better.  Denies f/c, cp, sob, n/v/d, abd pain  Objective: Vitals:   11/24/20 0443 11/24/20 1425 11/24/20 2128 11/25/20 0525  BP: (!) 154/61 (!) 154/54 (!) 137/46 (!) 153/45  Pulse: (!) 56 (!) 58 (!) 58 65  Resp: '18 17 18 17  '$ Temp: 98.1 F (36.7 C) 98 F (36.7 C) 98.5 F (36.9 C) 98.3 F (36.8 C)  TempSrc:  Oral Oral Oral  SpO2: 97% 97% 95% 94%  Weight:      Height:        Intake/Output Summary (Last 24 hours) at 11/25/2020 1220 Last data filed at 11/24/2020 1300 Gross per 24 hour  Intake 240 ml  Output --  Net 240 ml   Weight change:  Exam:  General:  Pt is alert, follows commands appropriately, not in acute distress HEENT: No icterus, No thrush, No neck  mass, [place]/AT Cardiovascular: RRR, S1/S2, no rubs, no gallops Respiratory: bibasilar  crackles. No wheeze Abdomen: Soft/+BS, non tender, non distended, no guarding Extremities: Nonpitting edema, No lymphangitis, No petechiae, No rashes, no synovitis   Data Reviewed: I have personally reviewed following labs and imaging studies Basic Metabolic Panel: Recent Labs  Lab 11/22/20 0052 11/22/20 0533 11/23/20 0534 11/24/20 0439 11/25/20 0655  NA 136 139 138 141 141  K 3.7 3.9 3.6 3.4* 3.8  CL 98 102 101 101 99  CO2 '30 29 31 '$ 34* 36*  GLUCOSE 103* 100* 115* 113* 113*  BUN 44* 40* '23 20 20  '$ CREATININE 1.22* 1.06* 0.87 0.78 0.82  CALCIUM 8.3* 8.4* 7.9* 7.8* 8.0*  MG  --   --  2.5*  --   --    Liver Function Tests: Recent Labs  Lab 11/22/20 0533 11/23/20 0534 11/24/20 0439 11/25/20 0655  AST 241* 210* 163* 129*  ALT 111* 104* 97* 94*  ALKPHOS 76 60 57 57  BILITOT 1.0 1.0 0.8 0.9  PROT 5.4* 4.8* 4.4* 4.5*  ALBUMIN 2.8* 2.4* 2.2* 2.2*   No results for input(s): LIPASE, AMYLASE in the last 168 hours. No results for input(s): AMMONIA in the last 168 hours. Coagulation Profile: No results for input(s): INR, PROTIME in the last 168 hours. CBC: Recent Labs  Lab 11/22/20 0052 11/22/20 0533 11/23/20 0534 11/24/20 0439 11/25/20 0655  WBC 21.0* 18.8* 10.6* 9.1 10.2  NEUTROABS 16.3*  --   --   --   --   HGB 14.4 13.5 12.9 11.8* 11.4*  HCT 42.8 41.0 40.2 36.7 35.5*  MCV 98.2 98.6 100.2* 100.3* 102.3*  PLT 245 210 183 169 181   Cardiac Enzymes: Recent Labs  Lab 11/22/20 0052 11/22/20 0533 11/23/20 0534 11/24/20 0439 11/25/20 0655  CKTOTAL 10,185* 13,299* 6,204* 3,532* 1,977*   BNP: Invalid input(s): POCBNP CBG: Recent Labs  Lab 11/24/20 1110 11/24/20 1759 11/24/20 2129 11/25/20 0751 11/25/20 1120  GLUCAP 159* 114* 172* 114* 192*   HbA1C: No results for input(s): HGBA1C in the last 72 hours. Urine analysis:    Component Value Date/Time   COLORURINE YELLOW  11/22/2020 0053   APPEARANCEUR CLEAR 11/22/2020 0053   LABSPEC 1.012 11/22/2020 0053   PHURINE 6.0 11/22/2020 0053   GLUCOSEU NEGATIVE 11/22/2020 0053   HGBUR LARGE (A) 11/22/2020 0053   BILIRUBINUR NEGATIVE 11/22/2020 0053   KETONESUR NEGATIVE 11/22/2020 0053   PROTEINUR 30 (A) 11/22/2020 0053   UROBILINOGEN 0.2 04/25/2014 1355   NITRITE NEGATIVE 11/22/2020 0053   LEUKOCYTESUR NEGATIVE 11/22/2020 0053   Sepsis Labs: '@LABRCNTIP'$ (procalcitonin:4,lacticidven:4) ) Recent Results (from the past 240 hour(s))  Resp Panel by RT-PCR (Flu A&B, Covid) Urine, Clean Catch     Status: Abnormal   Collection Time: 11/22/20 12:53 AM  Specimen: Urine, Clean Catch; Nasopharyngeal(NP) swabs in vial transport medium  Result Value Ref Range Status   SARS Coronavirus 2 by RT PCR POSITIVE (A) NEGATIVE Final    Comment: RESULT CALLED TO, READ BACK BY AND VERIFIED WITH: Spence,H'@0303'$  by Zigmund Daniel, b 8.10.22 (NOTE) SARS-CoV-2 target nucleic acids are DETECTED.  The SARS-CoV-2 RNA is generally detectable in upper respiratory specimens during the acute phase of infection. Positive results are indicative of the presence of the identified virus, but do not rule out bacterial infection or co-infection with other pathogens not detected by the test. Clinical correlation with patient history and other diagnostic information is necessary to determine patient infection status. The expected result is Negative.  Fact Sheet for Patients: EntrepreneurPulse.com.au  Fact Sheet for Healthcare Providers: IncredibleEmployment.be  This test is not yet approved or cleared by the [place] FDA and  has been authorized for detection and/or diagnosis of SARS-CoV-2 by FDA under an Emergency Use Authorization (EUA).  This EUA will remain in effect (meaning this test can  be used) for the duration of  the COVID-19 declaration under Section 564(b)(1) of the Act, 21 U.S.C. section  360bbb-3(b)(1), unless the authorization is terminated or revoked sooner.     Influenza A by PCR NEGATIVE NEGATIVE Final   Influenza B by PCR NEGATIVE NEGATIVE Final    Comment: (NOTE) The Xpert Xpress SARS-CoV-2/FLU/RSV plus assay is intended as an aid in the diagnosis of influenza from Nasopharyngeal swab specimens and should not be used as a sole basis for treatment. Nasal washings and aspirates are unacceptable for Xpert Xpress SARS-CoV-2/FLU/RSV testing.  Fact Sheet for Patients: EntrepreneurPulse.com.au  Fact Sheet for Healthcare Providers: IncredibleEmployment.be  This test is not yet approved or cleared by the [place] FDA and has been authorized for detection and/or diagnosis of SARS-CoV-2 by FDA under an Emergency Use Authorization (EUA). This EUA will remain in effect (meaning this test can be used) for the duration of the COVID-19 declaration under Section 564(b)(1) of the Act, 21 U.S.C. section 360bbb-3(b)(1), unless the authorization is terminated or revoked.  Performed at [facility], [address] [place], [place] [address]      Scheduled Meds:  amLODipine  2.5 mg Oral Daily   apixaban  10 mg Oral BID   Followed by   Derrill Memo ON 11/30/2020] apixaban  5 mg Oral BID   darifenacin  7.5 mg Oral Daily   insulin aspart  0-9 Units Subcutaneous TID WC   lactulose  30 g Oral BID   linaclotide  72 mcg Oral QAC breakfast   losartan  25 mg Oral Daily   metoprolol succinate  25 mg Oral Daily   nystatin   Topical TID   polyethylene glycol  17 g Oral Daily   senna  2 tablet Oral Daily   traZODone  100 mg Oral QHS   valACYclovir  1,000 mg Oral TID   Continuous Infusions:  sodium chloride 50 mL/hr at 11/23/20 1936   remdesivir 100 mg in NS 100 mL 100 mg (11/25/20 0921)   sodium bicarbonate 150 mEq in D5W infusion 75 mL/hr at 11/24/20 1358    Procedures/Studies: DG Chest 2 View  Result Date: 11/22/2020 CLINICAL DATA:   Recent fall with chest pain, initial encounter EXAM: CHEST - 2 VIEW COMPARISON:  06/12/2013 FINDINGS: Cardiac shadow is within normal limits. Aortic calcifications are noted. Postsurgical changes are noted in the cervical spine. Lungs are well aerated bilaterally. No focal infiltrate or sizable effusion is seen. Mild scarring is  noted in the left base. No bony abnormality is noted. IMPRESSION: No acute abnormality noted. Electronically Signed   By: Inez Catalina M.D.   On: 11/22/2020 02:18   DG Lumbar Spine Complete  Result Date: 11/22/2020 CLINICAL DATA:  Recent fall with low back pain, initial encounter EXAM: LUMBAR SPINE - COMPLETE 4+ VIEW COMPARISON:  05/27/2013 FINDINGS: Five lumbar type vertebral bodies are well visualized. Changes of prior fusion are noted from L2-S1 with posterior fixation. The overall appearance is similar to that seen on the prior exam. No hardware failure is noted. Degenerative changes at L1-2 are seen. Fecal material is noted within the colon consistent with mild constipation. IMPRESSION: Degenerative change without acute abnormality. Mild colonic constipation. Electronically Signed   By: Inez Catalina M.D.   On: 11/22/2020 02:21   CT HEAD WO CONTRAST (5MM)  Result Date: 11/22/2020 CLINICAL DATA:  Found on floor. EXAM: CT HEAD WITHOUT CONTRAST TECHNIQUE: Contiguous axial images were obtained from the base of the skull through the vertex without intravenous contrast. COMPARISON:  05/27/2013 FINDINGS: Brain: Age related volume loss. No acute intracranial abnormality. Specifically, no hemorrhage, hydrocephalus, mass lesion, acute infarction, or significant intracranial injury. Vascular: No hyperdense vessel or unexpected calcification. Skull: No acute calvarial abnormality. Sinuses/Orbits: No acute findings Other: None IMPRESSION: No acute intracranial abnormality. Electronically Signed   By: Rolm Baptise M.D.   On: 11/22/2020 01:34   CT Cervical Spine Wo Contrast  Result Date:  11/22/2020 CLINICAL DATA:  Neck trauma (Age >= 65y).  Found down. EXAM: CT CERVICAL SPINE WITHOUT CONTRAST TECHNIQUE: Multidetector CT imaging of the cervical spine was performed without intravenous contrast. Multiplanar CT image reconstructions were also generated. COMPARISON:  MRI 06/24/2018 FINDINGS: Alignment: Minimal 2 mm degenerative anterolisthesis of C2 on C3. Skull base and vertebrae: No acute fracture. No primary bone lesion or focal pathologic process. Soft tissues and spinal canal: No prevertebral fluid or swelling. No visible canal hematoma. Disc levels: Anterior fusion from C3-C6. Diffuse advanced degenerative disc and facet disease. Upper chest: No acute findings Other: None IMPRESSION: Anterior fusion changes from C3-C6. Diffuse degenerative disc and facet disease. No acute bony abnormality. Electronically Signed   By: Rolm Baptise M.D.   On: 11/22/2020 01:34   US Venous Img Lower Bilateral (DVT)  Result Date: 11/22/2020 CLINICAL DATA:  Bilateral lower extremity pain and edema worse on the right EXAM: BILATERAL LOWER EXTREMITY VENOUS DOPPLER ULTRASOUND TECHNIQUE: Gray-scale sonography with graded compression, as well as color Doppler and duplex ultrasound were performed to evaluate the lower extremity deep venous systems from the level of the common femoral vein and including the common femoral, femoral, profunda femoral, popliteal and calf veins including the posterior tibial, peroneal and gastrocnemius veins when visible. The superficial great saphenous vein was also interrogated. Spectral Doppler was utilized to evaluate flow at rest and with distal augmentation maneuvers in the common femoral, femoral and popliteal veins. COMPARISON:  None. FINDINGS: RIGHT LOWER EXTREMITY Common Femoral Vein: No evidence of thrombus. Normal compressibility, respiratory phasicity and response to augmentation. Saphenofemoral Junction: No evidence of thrombus. Normal compressibility and flow on color Doppler  imaging. Profunda Femoral Vein: No evidence of thrombus. Normal compressibility and flow on color Doppler imaging. Femoral Vein: No evidence of thrombus. Normal compressibility, respiratory phasicity and response to augmentation. Popliteal Vein: Hypoechoic intraluminal thrombus appearing occlusive. Vessel noncompressible. Phasic flow not demonstrated. Augmentation not performed Calf Veins: Thrombus does appear to extend into the calf tibial and peroneal veins. LEFT LOWER EXTREMITY Common Femoral Vein:  No evidence of thrombus. Normal compressibility, respiratory phasicity and response to augmentation. Saphenofemoral Junction: No evidence of thrombus. Normal compressibility and flow on color Doppler imaging. Profunda Femoral Vein: No evidence of thrombus. Normal compressibility and flow on color Doppler imaging. Femoral Vein: No evidence of thrombus. Normal compressibility, respiratory phasicity and response to augmentation. Popliteal Vein: Hypoechoic intraluminal thrombus. Thrombus appears occlusive. Vessel noncompressible. No phasic flow. Augmentation not performed. Calf Veins: Thrombus does appear to extend into the left calf tibial and peroneal veins. IMPRESSION: Positive exam for bilateral popliteal DVT with extension into the calf tibial and peroneal veins Electronically Signed   By: Jerilynn Mages.  Shick M.D.   On: 11/22/2020 11:45   DG Knee Complete 4 Views Left  Result Date: 11/22/2020 CLINICAL DATA:  Recent fall with left knee pain, initial encounter EXAM: LEFT KNEE - COMPLETE 4+ VIEW COMPARISON:  07/04/2018 FINDINGS: Left knee prosthesis is noted in satisfactory position. No acute fracture or dislocation is noted. No joint effusion is seen. Vascular calcifications are seen. IMPRESSION: No acute abnormality noted. Electronically Signed   By: Inez Catalina M.D.   On: 11/22/2020 02:18   DG Knee Complete 4 Views Right  Result Date: 11/22/2020 CLINICAL DATA:  Recent fall with knee pain, initial encounter EXAM: RIGHT  KNEE - COMPLETE 4+ VIEW COMPARISON:  11/29/2011 FINDINGS: Right knee prosthesis is noted. No joint effusion is seen. No acute fracture or dislocation is seen. Vascular calcifications are noted. IMPRESSION: No acute abnormality noted. Electronically Signed   By: Inez Catalina M.D.   On: 11/22/2020 02:20   US Abdomen Limited RUQ (LIVER/GB)  Result Date: 11/22/2020 CLINICAL DATA:  Abnormal LFTs EXAM: ULTRASOUND ABDOMEN LIMITED RIGHT UPPER QUADRANT COMPARISON:  CT abdomen/pelvis 05/27/2013 FINDINGS: Gallbladder: The gallbladder is surgically absent. Common bile duct: Diameter: 5 mm Liver: The liver demonstrates diffusely coarsened echotexture with a nodular surface contour. There is a geographic region of increased echogenicity in the left hepatic lobe measuring 3.6 cm x 1.9 cm x 3.0 cm. Portal vein is patent on color Doppler imaging with normal direction of blood flow towards the liver. Other: None. IMPRESSION: 1. Coarsened echotexture and nodular surface contour of the liver suggesting mild cirrhosis. 2. Geographic region of increased echogenicity in the left hepatic lobe may be related to focal fatty change; however, dedicated nonemergent liver protocol MRI is recommended to exclude underlying mass lesion. Electronically Signed   By: Valetta Mole MD   On: 11/22/2020 12:00    Orson Eva, DO  [facility]  If 7PM-7AM, please contact night-coverage [email or web address] Password [email or web address] 11/25/2020, 12:20 PM   LOS: 3 days

## 2020-11-26 LAB — COMPREHENSIVE METABOLIC PANEL
ALT: 80 U/L — ABNORMAL HIGH (ref 0–44)
AST: 89 U/L — ABNORMAL HIGH (ref 15–41)
Albumin: 2 g/dL — ABNORMAL LOW (ref 3.5–5.0)
Alkaline Phosphatase: 53 U/L (ref 38–126)
Anion gap: 9 (ref 5–15)
BUN: 20 mg/dL (ref 8–23)
CO2: 27 mmol/L (ref 22–32)
Calcium: 7.3 mg/dL — ABNORMAL LOW (ref 8.9–10.3)
Chloride: 103 mmol/L (ref 98–111)
Creatinine, Ser: 0.84 mg/dL (ref 0.44–1.00)
GFR, Estimated: >60 mL/min (ref >60)
Glucose, Bld: 105 mg/dL — ABNORMAL HIGH (ref 70–99)
Potassium: 3.3 mmol/L — ABNORMAL LOW (ref 3.5–5.1)
Sodium: 139 mmol/L (ref 135–145)
Total Bilirubin: 1 mg/dL (ref 0.3–1.2)
Total Protein: 4.1 g/dL — ABNORMAL LOW (ref 6.5–8.1)

## 2020-11-26 LAB — CBC
HCT: 33.7 % — ABNORMAL LOW (ref 36.0–46.0)
Hemoglobin: 10.6 g/dL — ABNORMAL LOW (ref 12.0–15.0)
MCH: 32.2 pg (ref 26.0–34.0)
MCHC: 31.5 g/dL (ref 30.0–36.0)
MCV: 102.4 fL — ABNORMAL HIGH (ref 80.0–100.0)
Platelets: 170 10*3/uL (ref 150–400)
RBC: 3.29 MIL/uL — ABNORMAL LOW (ref 3.87–5.11)
RDW: 13.5 % (ref 11.5–15.5)
WBC: 10.3 10*3/uL (ref 4.0–10.5)
nRBC: 0 % (ref 0.0–0.2)

## 2020-11-26 LAB — CK: Total CK: 896 U/L — ABNORMAL HIGH (ref 38–234)

## 2020-11-26 LAB — GLUCOSE, CAPILLARY
Glucose-Capillary: 101 mg/dL — ABNORMAL HIGH (ref 70–99)
Glucose-Capillary: 145 mg/dL — ABNORMAL HIGH (ref 70–99)
Glucose-Capillary: 104 mg/dL — ABNORMAL HIGH (ref 70–99)
Glucose-Capillary: 171 mg/dL — ABNORMAL HIGH (ref 70–99)

## 2020-11-26 LAB — C-REACTIVE PROTEIN: CRP: 2.4 mg/dL — ABNORMAL HIGH (ref <1.0)

## 2020-11-26 LAB — D-DIMER, QUANTITATIVE: D-Dimer, Quant: 1.97 ug/mL-FEU — ABNORMAL HIGH (ref 0.00–0.50)

## 2020-11-26 MED ORDER — MELATONIN 3 MG PO TABS
6.0000 mg | ORAL_TABLET | Freq: Every day | ORAL | Status: DC
  Administered 2020-11-26: 6 mg via ORAL
  Filled 2020-11-26: qty 2

## 2020-11-26 MED ORDER — POTASSIUM CHLORIDE CRYS ER 20 MEQ PO TBCR
20.0000 meq | EXTENDED_RELEASE_TABLET | Freq: Once | ORAL | Status: AC
  Administered 2020-11-26: 20 meq via ORAL
  Filled 2020-11-26: qty 1

## 2020-11-26 MED ORDER — VENLAFAXINE HCL ER 75 MG PO CP24
75.0000 mg | ORAL_CAPSULE | Freq: Every day | ORAL | Status: DC
  Administered 2020-11-27: 75 mg via ORAL
  Filled 2020-11-26: qty 1

## 2020-11-26 NOTE — Discharge Summary (Signed)
Physician Discharge Summary  Leslie Hendricks [id number] DOB: 04/16/1940 DOA: 11/22/2020  PCP: Curly Rim, MD  Admit date: 11/22/2020 Discharge date: 11/27/2020  Admitted From: Home Disposition:  Home   Recommendations for Outpatient Follow-up:  Follow up with PCP in 1-2 weeks Please obtain BMP/CBC in one week   Home Health: HHPT, HHRN, SW Equipment/Devices:bedside commode  Discharge Condition: Stable CODE STATUS: DNR Diet recommendation: Heart Healthy    Brief/Interim Summary: 80 y.o. female, with history of anemia, DMII, frequent falls, vision loss 2/2 macular degeneration, GERD, headache, HLD, HTN, and more presents to the ED with a chief complaint of fall. Patient reports that she fell in her closet. She was walking along when her legs became so weak, they just gave out from under her. She reports that she hit under her chin, but did not hit her head, did not lose consciousness. She had no preceding chest pain, dizziness, or dyspnea. She reports that she has been getting progressively weaker since she was diagnosed with covid with a positive home test on 11/16/20. Patient was not able to get up on her own. Her sister called EMS upon discovering that patient was on the floor, and the paramedics had to help get her up. She thinks she was down approx 4 hours.  She has had palpitations when she is coughing for the last 3 days. Patient reports chronic headaches that are better with tylenol. She has no change in vision or hearing. Patient has not had dyspnea. She reports that she does have weakness greater on the right than the left, but that this has been present for 4-5 years. She denies cp, sob, n/v/d, abd pain.  She does not smoke, does not drink, and does not use illicit drugs. Patient is DNR. She was started on bicarbonate drip and IVF with clinical improvement.  She was found to have bilateral leg DVTs for which she was started apixaban without any complications.  Due to her  covid infection, SNF was not able to offer a bed until 12/04/20.  As a result, the pt will go home with her sister with home health PT until 12/04/20 at which time she will transition to SNF.  Discharge Diagnoses:  Rhabdomyolysis -CPK peaking 13,299>>[id number]>>3532>>1977>>896 -initiated bicarbonate drip>>d/c on 8/14 -continue NS during hospitalization -Holding Lipitor>>restart after dc   COVID-19 infection -Stable on room air with saturation 94-95% -Started remdesivir--finished 5 days -CRP 0.6>>2.9>>2.9>>2.6>>2.4 -Ferritin 159 -D-dimer 6.98>>3.93>>2.52>>2.18>>1.97   Essential hypertension -Continue amlodipine and losartan -Continue metoprolol succinate   CKD stage IIIa -Baseline creatinine 0.9-1.2 -Monitor BMP   Transaminasemia -Secondary COVID-19 infection and rhabdomyolysis -RUQ US--Coarsened echotexture and nodular surface contour of the liver suggesting mild cirrhosis;  Geographic region of increased echogenicity in the left hepatic lobe may be related to focal fatty change-->outpt MR liver -improving daily   Depression/anxiety -Continue trazodone and Effexor   Acute Leg DVTs -11/22/20 venous duplex--for bilateral popliteal DVT with extension into the calf tibial and peroneal veins -likely provoked by COVID 19 infection -started apixaban--plan at least 3 months   Constipation/Fecal Impacation -disimpacted -start miralax and senna--hold now with loose stool -restart linzess -bisacodyl supp given 8/12 -added lactulose--hold now with loose stool -d/c home with miralax daily   Hypokalemia -replete   Right Flank Rash -?injury from fall vs early Herpes Zoster -started valtrex 8/12-->plan 7 days (4 more days after d/c) -stable/improved   Discharge Instructions   Allergies as of 11/27/2020       Reactions   Banana Nausea And  Vomiting   Codeine Nausea And Vomiting, Other (See Comments)   Dilaudid [hydromorphone] Other (See Comments)   Altered mental status   Other  Other (See Comments)   Oxycodone Hcl Nausea And Vomiting   Oxycodone Hcl Other (See Comments)   Sausage [pickled Meat] Nausea And Vomiting   Tobramycin         Medication List     STOP taking these medications    dexamethasone 6 MG tablet Commonly known as: DECADRON       TAKE these medications    amLODipine 2.5 MG tablet Commonly known as: NORVASC Take 1 tablet by mouth once daily   apixaban 5 MG Tabs tablet Commonly known as: ELIQUIS Take 2 tablets (10 mg total) by mouth 2 (two) times daily. Then one tab (5 mg) twice daily starting 11/30/20   aspirin 81 MG EC tablet Take 1 tablet by mouth daily   atorvastatin 80 MG tablet Commonly known as: LIPITOR Take 80 mg by mouth at bedtime.   folic acid A999333 MCG tablet Commonly known as: FOLVITE Take 400 mcg by mouth daily.   furosemide 40 MG tablet Commonly known as: LASIX Take 1 tablet (40 mg total) by mouth 2 (two) times daily.   HYDROcodone-acetaminophen 7.5-325 MG tablet Commonly known as: NORCO take 1 tablet by oral route  every 8 hours as needed for pain (DNF 09/02/20)   Linzess 72 MCG capsule Generic drug: linaclotide Take 72 mcg by mouth every morning.   losartan 25 MG tablet Commonly known as: COZAAR Take 1 tablet by mouth daily.   Magnesium 250 MG Tabs Take 1 tablet by mouth daily.   metoprolol succinate 25 MG 24 hr tablet Commonly known as: Toprol XL Take 1 tablet (25 mg total) by mouth daily. Can take a extra half a tablet for palpitations   nystatin powder Commonly known as: MYCOSTATIN/NYSTOP Apply 1 application topically 3 (three) times daily as needed (rash).   omega-3 fish oil 1000 MG Caps capsule Commonly known as: MAXEPA Take 1 capsule by mouth daily.   polyethylene glycol 17 g packet Commonly known as: MIRALAX / GLYCOLAX Take 17 g by mouth daily.   solifenacin 5 MG tablet Commonly known as: VESICARE Take 5 mg by mouth daily.   SYSTANE OP Place 1 drop into both eyes 2 (two) times  daily.   traZODone 100 MG tablet Commonly known as: DESYREL Take 100 mg by mouth at bedtime.   valACYclovir 1000 MG tablet Commonly known as: VALTREX Take 1 tablet (1,000 mg total) by mouth 3 (three) times daily.   venlafaxine XR 75 MG 24 hr capsule Commonly known as: EFFEXOR-XR Take 75 mg by mouth daily with breakfast.   VISION FORMULA/LUTEIN PO Take 1 capsule by mouth daily.   Vitamin D-3 25 MCG (1000 UT) Caps Take 1 capsule by mouth daily.   vitamin E 180 MG (400 UNITS) capsule Take 400 Units by mouth daily.   zinc gluconate 50 MG tablet Take 50 mg by mouth daily.               Durable Medical Equipment  (From admission, onward)           Start     Ordered   11/26/20 1508  For home use only DME Bedside commode  Once       Question Answer Comment  Patient needs a bedside commode to treat with the following condition Dyspnea   Patient needs a bedside commode to treat with the following  condition Physical deconditioning      11/26/20 1507            Contact information for follow-up providers     [facility].   Specialty: Home Health Services Why: RN/SW will call to set up your first home visit.             Contact information for after-discharge care     Destination     [facility] [place] [facility] .   Service: Skilled Nursing Contact information: [address] [place] [address] [phone]                    Allergies  Allergen Reactions   Banana Nausea And Vomiting   Codeine Nausea And Vomiting and Other (See Comments)   Dilaudid [Hydromorphone] Other (See Comments)    Altered mental status   Other Other (See Comments)   Oxycodone Hcl Nausea And Vomiting   Oxycodone Hcl Other (See Comments)   Sausage [Pickled Meat] Nausea And Vomiting   Tobramycin     Consultations: none   Procedures/Studies: DG Chest 2 View  Result Date:  11/22/2020 CLINICAL DATA:  Recent fall with chest pain, initial encounter EXAM: CHEST - 2 VIEW COMPARISON:  06/12/2013 FINDINGS: Cardiac shadow is within normal limits. Aortic calcifications are noted. Postsurgical changes are noted in the cervical spine. Lungs are well aerated bilaterally. No focal infiltrate or sizable effusion is seen. Mild scarring is noted in the left base. No bony abnormality is noted. IMPRESSION: No acute abnormality noted. Electronically Signed   By: Inez Catalina M.D.   On: 11/22/2020 02:18   DG Lumbar Spine Complete  Result Date: 11/22/2020 CLINICAL DATA:  Recent fall with low back pain, initial encounter EXAM: LUMBAR SPINE - COMPLETE 4+ VIEW COMPARISON:  05/27/2013 FINDINGS: Five lumbar type vertebral bodies are well visualized. Changes of prior fusion are noted from L2-S1 with posterior fixation. The overall appearance is similar to that seen on the prior exam. No hardware failure is noted. Degenerative changes at L1-2 are seen. Fecal material is noted within the colon consistent with mild constipation. IMPRESSION: Degenerative change without acute abnormality. Mild colonic constipation. Electronically Signed   By: Inez Catalina M.D.   On: 11/22/2020 02:21   CT HEAD WO CONTRAST (5MM)  Result Date: 11/22/2020 CLINICAL DATA:  Found on floor. EXAM: CT HEAD WITHOUT CONTRAST TECHNIQUE: Contiguous axial images were obtained from the base of the skull through the vertex without intravenous contrast. COMPARISON:  05/27/2013 FINDINGS: Brain: Age related volume loss. No acute intracranial abnormality. Specifically, no hemorrhage, hydrocephalus, mass lesion, acute infarction, or significant intracranial injury. Vascular: No hyperdense vessel or unexpected calcification. Skull: No acute calvarial abnormality. Sinuses/Orbits: No acute findings Other: None IMPRESSION: No acute intracranial abnormality. Electronically Signed   By: Rolm Baptise M.D.   On: 11/22/2020 01:34   CT Cervical Spine Wo  Contrast  Result Date: 11/22/2020 CLINICAL DATA:  Neck trauma (Age >= 65y).  Found down. EXAM: CT CERVICAL SPINE WITHOUT CONTRAST TECHNIQUE: Multidetector CT imaging of the cervical spine was performed without intravenous contrast. Multiplanar CT image reconstructions were also generated. COMPARISON:  MRI 06/24/2018 FINDINGS: Alignment: Minimal 2 mm degenerative anterolisthesis of C2 on C3. Skull base and vertebrae: No acute fracture. No primary bone lesion or focal pathologic process. Soft tissues and spinal canal: No prevertebral fluid or swelling. No visible canal hematoma. Disc levels: Anterior fusion from C3-C6. Diffuse advanced degenerative disc  and facet disease. Upper chest: No acute findings Other: None IMPRESSION: Anterior fusion changes from C3-C6. Diffuse degenerative disc and facet disease. No acute bony abnormality. Electronically Signed   By: Rolm Baptise M.D.   On: 11/22/2020 01:34   US Venous Img Lower Bilateral (DVT)  Result Date: 11/22/2020 CLINICAL DATA:  Bilateral lower extremity pain and edema worse on the right EXAM: BILATERAL LOWER EXTREMITY VENOUS DOPPLER ULTRASOUND TECHNIQUE: Gray-scale sonography with graded compression, as well as color Doppler and duplex ultrasound were performed to evaluate the lower extremity deep venous systems from the level of the common femoral vein and including the common femoral, femoral, profunda femoral, popliteal and calf veins including the posterior tibial, peroneal and gastrocnemius veins when visible. The superficial great saphenous vein was also interrogated. Spectral Doppler was utilized to evaluate flow at rest and with distal augmentation maneuvers in the common femoral, femoral and popliteal veins. COMPARISON:  None. FINDINGS: RIGHT LOWER EXTREMITY Common Femoral Vein: No evidence of thrombus. Normal compressibility, respiratory phasicity and response to augmentation. Saphenofemoral Junction: No evidence of thrombus. Normal compressibility and  flow on color Doppler imaging. Profunda Femoral Vein: No evidence of thrombus. Normal compressibility and flow on color Doppler imaging. Femoral Vein: No evidence of thrombus. Normal compressibility, respiratory phasicity and response to augmentation. Popliteal Vein: Hypoechoic intraluminal thrombus appearing occlusive. Vessel noncompressible. Phasic flow not demonstrated. Augmentation not performed Calf Veins: Thrombus does appear to extend into the calf tibial and peroneal veins. LEFT LOWER EXTREMITY Common Femoral Vein: No evidence of thrombus. Normal compressibility, respiratory phasicity and response to augmentation. Saphenofemoral Junction: No evidence of thrombus. Normal compressibility and flow on color Doppler imaging. Profunda Femoral Vein: No evidence of thrombus. Normal compressibility and flow on color Doppler imaging. Femoral Vein: No evidence of thrombus. Normal compressibility, respiratory phasicity and response to augmentation. Popliteal Vein: Hypoechoic intraluminal thrombus. Thrombus appears occlusive. Vessel noncompressible. No phasic flow. Augmentation not performed. Calf Veins: Thrombus does appear to extend into the left calf tibial and peroneal veins. IMPRESSION: Positive exam for bilateral popliteal DVT with extension into the calf tibial and peroneal veins Electronically Signed   By: Jerilynn Mages.  Shick M.D.   On: 11/22/2020 11:45   DG Knee Complete 4 Views Left  Result Date: 11/22/2020 CLINICAL DATA:  Recent fall with left knee pain, initial encounter EXAM: LEFT KNEE - COMPLETE 4+ VIEW COMPARISON:  07/04/2018 FINDINGS: Left knee prosthesis is noted in satisfactory position. No acute fracture or dislocation is noted. No joint effusion is seen. Vascular calcifications are seen. IMPRESSION: No acute abnormality noted. Electronically Signed   By: Inez Catalina M.D.   On: 11/22/2020 02:18   DG Knee Complete 4 Views Right  Result Date: 11/22/2020 CLINICAL DATA:  Recent fall with knee pain, initial  encounter EXAM: RIGHT KNEE - COMPLETE 4+ VIEW COMPARISON:  11/29/2011 FINDINGS: Right knee prosthesis is noted. No joint effusion is seen. No acute fracture or dislocation is seen. Vascular calcifications are noted. IMPRESSION: No acute abnormality noted. Electronically Signed   By: Inez Catalina M.D.   On: 11/22/2020 02:20   US Abdomen Limited RUQ (LIVER/GB)  Result Date: 11/22/2020 CLINICAL DATA:  Abnormal LFTs EXAM: ULTRASOUND ABDOMEN LIMITED RIGHT UPPER QUADRANT COMPARISON:  CT abdomen/pelvis 05/27/2013 FINDINGS: Gallbladder: The gallbladder is surgically absent. Common bile duct: Diameter: 5 mm Liver: The liver demonstrates diffusely coarsened echotexture with a nodular surface contour. There is a geographic region of increased echogenicity in the left hepatic lobe measuring 3.6 cm x 1.9 cm x 3.0 cm.  Portal vein is patent on color Doppler imaging with normal direction of blood flow towards the liver. Other: None. IMPRESSION: 1. Coarsened echotexture and nodular surface contour of the liver suggesting mild cirrhosis. 2. Geographic region of increased echogenicity in the left hepatic lobe may be related to focal fatty change; however, dedicated nonemergent liver protocol MRI is recommended to exclude underlying mass lesion. Electronically Signed   By: Valetta Mole MD   On: 11/22/2020 12:00        Discharge Exam: Vitals:   11/27/20 0526 11/27/20 0851  BP: (!) 133/42 (!) 158/61  Pulse: (!) 51 (!) 55  Resp: 16   Temp: (!) 97.4 F (36.3 C)   SpO2: 97%    Vitals:   11/26/20 1520 11/26/20 1953 11/27/20 0526 11/27/20 0851  BP: (!) 151/71 (!) 148/46 (!) 133/42 (!) 158/61  Pulse: 62 63 (!) 51 (!) 55  Resp: '16 18 16   '$ Temp: 98.3 F (36.8 C) 97.9 F (36.6 C) (!) 97.4 F (36.3 C)   TempSrc: Oral Oral Oral   SpO2: 100% 97% 97%   Weight:      Height:        General: Pt is alert, awake, not in acute distress Cardiovascular: RRR, S1/S2 +, no rubs, no gallops Respiratory: bibasilar rales. No  wheeze Abdominal: Soft, NT, ND, bowel sounds + Extremities: 1+ LE edema, no cyanosis   The results of significant diagnostics from this hospitalization (including imaging, microbiology, ancillary and laboratory) are listed below for reference.    Significant Diagnostic Studies: DG Chest 2 View  Result Date: 11/22/2020 CLINICAL DATA:  Recent fall with chest pain, initial encounter EXAM: CHEST - 2 VIEW COMPARISON:  06/12/2013 FINDINGS: Cardiac shadow is within normal limits. Aortic calcifications are noted. Postsurgical changes are noted in the cervical spine. Lungs are well aerated bilaterally. No focal infiltrate or sizable effusion is seen. Mild scarring is noted in the left base. No bony abnormality is noted. IMPRESSION: No acute abnormality noted. Electronically Signed   By: Inez Catalina M.D.   On: 11/22/2020 02:18   DG Lumbar Spine Complete  Result Date: 11/22/2020 CLINICAL DATA:  Recent fall with low back pain, initial encounter EXAM: LUMBAR SPINE - COMPLETE 4+ VIEW COMPARISON:  05/27/2013 FINDINGS: Five lumbar type vertebral bodies are well visualized. Changes of prior fusion are noted from L2-S1 with posterior fixation. The overall appearance is similar to that seen on the prior exam. No hardware failure is noted. Degenerative changes at L1-2 are seen. Fecal material is noted within the colon consistent with mild constipation. IMPRESSION: Degenerative change without acute abnormality. Mild colonic constipation. Electronically Signed   By: Inez Catalina M.D.   On: 11/22/2020 02:21   CT HEAD WO CONTRAST (5MM)  Result Date: 11/22/2020 CLINICAL DATA:  Found on floor. EXAM: CT HEAD WITHOUT CONTRAST TECHNIQUE: Contiguous axial images were obtained from the base of the skull through the vertex without intravenous contrast. COMPARISON:  05/27/2013 FINDINGS: Brain: Age related volume loss. No acute intracranial abnormality. Specifically, no hemorrhage, hydrocephalus, mass lesion, acute infarction, or  significant intracranial injury. Vascular: No hyperdense vessel or unexpected calcification. Skull: No acute calvarial abnormality. Sinuses/Orbits: No acute findings Other: None IMPRESSION: No acute intracranial abnormality. Electronically Signed   By: Rolm Baptise M.D.   On: 11/22/2020 01:34   CT Cervical Spine Wo Contrast  Result Date: 11/22/2020 CLINICAL DATA:  Neck trauma (Age >= 65y).  Found down. EXAM: CT CERVICAL SPINE WITHOUT CONTRAST TECHNIQUE: Multidetector CT imaging of the  cervical spine was performed without intravenous contrast. Multiplanar CT image reconstructions were also generated. COMPARISON:  MRI 06/24/2018 FINDINGS: Alignment: Minimal 2 mm degenerative anterolisthesis of C2 on C3. Skull base and vertebrae: No acute fracture. No primary bone lesion or focal pathologic process. Soft tissues and spinal canal: No prevertebral fluid or swelling. No visible canal hematoma. Disc levels: Anterior fusion from C3-C6. Diffuse advanced degenerative disc and facet disease. Upper chest: No acute findings Other: None IMPRESSION: Anterior fusion changes from C3-C6. Diffuse degenerative disc and facet disease. No acute bony abnormality. Electronically Signed   By: Rolm Baptise M.D.   On: 11/22/2020 01:34   US Venous Img Lower Bilateral (DVT)  Result Date: 11/22/2020 CLINICAL DATA:  Bilateral lower extremity pain and edema worse on the right EXAM: BILATERAL LOWER EXTREMITY VENOUS DOPPLER ULTRASOUND TECHNIQUE: Gray-scale sonography with graded compression, as well as color Doppler and duplex ultrasound were performed to evaluate the lower extremity deep venous systems from the level of the common femoral vein and including the common femoral, femoral, profunda femoral, popliteal and calf veins including the posterior tibial, peroneal and gastrocnemius veins when visible. The superficial great saphenous vein was also interrogated. Spectral Doppler was utilized to evaluate flow at rest and with distal  augmentation maneuvers in the common femoral, femoral and popliteal veins. COMPARISON:  None. FINDINGS: RIGHT LOWER EXTREMITY Common Femoral Vein: No evidence of thrombus. Normal compressibility, respiratory phasicity and response to augmentation. Saphenofemoral Junction: No evidence of thrombus. Normal compressibility and flow on color Doppler imaging. Profunda Femoral Vein: No evidence of thrombus. Normal compressibility and flow on color Doppler imaging. Femoral Vein: No evidence of thrombus. Normal compressibility, respiratory phasicity and response to augmentation. Popliteal Vein: Hypoechoic intraluminal thrombus appearing occlusive. Vessel noncompressible. Phasic flow not demonstrated. Augmentation not performed Calf Veins: Thrombus does appear to extend into the calf tibial and peroneal veins. LEFT LOWER EXTREMITY Common Femoral Vein: No evidence of thrombus. Normal compressibility, respiratory phasicity and response to augmentation. Saphenofemoral Junction: No evidence of thrombus. Normal compressibility and flow on color Doppler imaging. Profunda Femoral Vein: No evidence of thrombus. Normal compressibility and flow on color Doppler imaging. Femoral Vein: No evidence of thrombus. Normal compressibility, respiratory phasicity and response to augmentation. Popliteal Vein: Hypoechoic intraluminal thrombus. Thrombus appears occlusive. Vessel noncompressible. No phasic flow. Augmentation not performed. Calf Veins: Thrombus does appear to extend into the left calf tibial and peroneal veins. IMPRESSION: Positive exam for bilateral popliteal DVT with extension into the calf tibial and peroneal veins Electronically Signed   By: Jerilynn Mages.  Shick M.D.   On: 11/22/2020 11:45   DG Knee Complete 4 Views Left  Result Date: 11/22/2020 CLINICAL DATA:  Recent fall with left knee pain, initial encounter EXAM: LEFT KNEE - COMPLETE 4+ VIEW COMPARISON:  07/04/2018 FINDINGS: Left knee prosthesis is noted in satisfactory position. No  acute fracture or dislocation is noted. No joint effusion is seen. Vascular calcifications are seen. IMPRESSION: No acute abnormality noted. Electronically Signed   By: Inez Catalina M.D.   On: 11/22/2020 02:18   DG Knee Complete 4 Views Right  Result Date: 11/22/2020 CLINICAL DATA:  Recent fall with knee pain, initial encounter EXAM: RIGHT KNEE - COMPLETE 4+ VIEW COMPARISON:  11/29/2011 FINDINGS: Right knee prosthesis is noted. No joint effusion is seen. No acute fracture or dislocation is seen. Vascular calcifications are noted. IMPRESSION: No acute abnormality noted. Electronically Signed   By: Inez Catalina M.D.   On: 11/22/2020 02:20   US Abdomen Limited RUQ (LIVER/GB)  Result Date: 11/22/2020 CLINICAL DATA:  Abnormal LFTs EXAM: ULTRASOUND ABDOMEN LIMITED RIGHT UPPER QUADRANT COMPARISON:  CT abdomen/pelvis 05/27/2013 FINDINGS: Gallbladder: The gallbladder is surgically absent. Common bile duct: Diameter: 5 mm Liver: The liver demonstrates diffusely coarsened echotexture with a nodular surface contour. There is a geographic region of increased echogenicity in the left hepatic lobe measuring 3.6 cm x 1.9 cm x 3.0 cm. Portal vein is patent on color Doppler imaging with normal direction of blood flow towards the liver. Other: None. IMPRESSION: 1. Coarsened echotexture and nodular surface contour of the liver suggesting mild cirrhosis. 2. Geographic region of increased echogenicity in the left hepatic lobe may be related to focal fatty change; however, dedicated nonemergent liver protocol MRI is recommended to exclude underlying mass lesion. Electronically Signed   By: Valetta Mole MD   On: 11/22/2020 12:00    Microbiology: Recent Results (from the past 240 hour(s))  Resp Panel by RT-PCR (Flu A&B, Covid) Urine, Clean Catch     Status: Abnormal   Collection Time: 11/22/20 12:53 AM   Specimen: Urine, Clean Catch; Nasopharyngeal(NP) swabs in vial transport medium  Result Value Ref Range Status   SARS  Coronavirus 2 by RT PCR POSITIVE (A) NEGATIVE Final    Comment: RESULT CALLED TO, READ BACK BY AND VERIFIED WITH: Spence,H'@0303'$  by Zigmund Daniel, b 8.10.22 (NOTE) SARS-CoV-2 target nucleic acids are DETECTED.  The SARS-CoV-2 RNA is generally detectable in upper respiratory specimens during the acute phase of infection. Positive results are indicative of the presence of the identified virus, but do not rule out bacterial infection or co-infection with other pathogens not detected by the test. Clinical correlation with patient history and other diagnostic information is necessary to determine patient infection status. The expected result is Negative.  Fact Sheet for Patients: EntrepreneurPulse.com.au  Fact Sheet for Healthcare Providers: IncredibleEmployment.be  This test is not yet approved or cleared by the [place] FDA and  has been authorized for detection and/or diagnosis of SARS-CoV-2 by FDA under an Emergency Use Authorization (EUA).  This EUA will remain in effect (meaning this test can  be used) for the duration of  the COVID-19 declaration under Section 564(b)(1) of the Act, 21 U.S.C. section 360bbb-3(b)(1), unless the authorization is terminated or revoked sooner.     Influenza A by PCR NEGATIVE NEGATIVE Final   Influenza B by PCR NEGATIVE NEGATIVE Final    Comment: (NOTE) The Xpert Xpress SARS-CoV-2/FLU/RSV plus assay is intended as an aid in the diagnosis of influenza from Nasopharyngeal swab specimens and should not be used as a sole basis for treatment. Nasal washings and aspirates are unacceptable for Xpert Xpress SARS-CoV-2/FLU/RSV testing.  Fact Sheet for Patients: EntrepreneurPulse.com.au  Fact Sheet for Healthcare Providers: IncredibleEmployment.be  This test is not yet approved or cleared by the [place] FDA and has been authorized for detection and/or diagnosis of SARS-CoV-2  by FDA under an Emergency Use Authorization (EUA). This EUA will remain in effect (meaning this test can be used) for the duration of the COVID-19 declaration under Section 564(b)(1) of the Act, 21 U.S.C. section 360bbb-3(b)(1), unless the authorization is terminated or revoked.  Performed at [facility], [address] [place], [place] [address]      Labs: Basic Metabolic Panel: Recent Labs  Lab 11/23/20 0534 11/24/20 0439 11/25/20 0655 11/26/20 0617 11/27/20 0735  NA 138 141 141 139 142  K 3.6 3.4* 3.8 3.3* 4.7  CL 101 101 99 103 106  CO2 31 34* 36* 27 31  GLUCOSE 115* 113* 113* 105* 95  BUN '23 20 20 20 23  '$ CREATININE 0.87 0.78 0.82 0.84 0.98  CALCIUM 7.9* 7.8* 8.0* 7.3* 8.1*  MG 2.5*  --   --   --  2.2   Liver Function Tests: Recent Labs  Lab 11/23/20 0534 11/24/20 0439 11/25/20 0655 11/26/20 0617 11/27/20 0735  AST 210* 163* 129* 89* 66*  ALT 104* 97* 94* 80* 69*  ALKPHOS 60 57 57 53 60  BILITOT 1.0 0.8 0.9 1.0 0.7  PROT 4.8* 4.4* 4.5* 4.1* 4.2*  ALBUMIN 2.4* 2.2* 2.2* 2.0* 2.0*   No results for input(s): LIPASE, AMYLASE in the last 168 hours. No results for input(s): AMMONIA in the last 168 hours. CBC: Recent Labs  Lab 11/22/20 0052 11/22/20 0533 11/23/20 0534 11/24/20 0439 11/25/20 0655 11/26/20 0617 11/27/20 0735  WBC 21.0*   < > 10.6* 9.1 10.2 10.3 10.1  NEUTROABS 16.3*  --   --   --   --   --   --   HGB 14.4   < > 12.9 11.8* 11.4* 10.6* 10.3*  HCT 42.8   < > 40.2 36.7 35.5* 33.7* 32.9*  MCV 98.2   < > 100.2* 100.3* 102.3* 102.4* 104.1*  PLT 245   < > 183 169 181 170 170   < > = values in this interval not displayed.   Cardiac Enzymes: Recent Labs  Lab 11/23/20 0534 11/24/20 0439 11/25/20 0655 11/26/20 0617 11/27/20 0735  CKTOTAL 6,204* 3,532* 1,977* 896* 497*   BNP: Invalid input(s): POCBNP CBG: Recent Labs  Lab 11/26/20 0747 11/26/20 1206 11/26/20 1707 11/26/20 1958 11/27/20 0737  GLUCAP 101* 145* 104* 171* 81    Time  coordinating discharge:  36 minutes  Signed:  Orson Eva, DO [facility] Pager: [phone] 11/27/2020, 11:07 AM

## 2020-11-26 NOTE — Progress Notes (Signed)
PROGRESS NOTE  Leslie Hendricks [id number] DOB: 04/15/1941 DOA: 11/22/2020 PCP: Curly Rim, MD  Brief History:    80 y.o. female, with history of anemia, DMII, frequent falls, vision loss 2/2 macular degeneration, GERD, headache, HLD, HTN, and more presents to the ED with a chief complaint of fall. Patient reports that she fell in her closet. She was walking along when her legs became so weak, they just gave out from under her. She reports that she hit under her chin, but did not hit her head, did not lose consciousness. She had no preceding chest pain, dizziness, or dyspnea. She reports that she has been getting progressively weaker since she was diagnosed with covid with a positive home test on 11/16/20. Patient was not able to get up on her own. Her sister called EMS upon discovering that patient was on the floor, and the paramedics had to help get her up. She thinks she was down approx 4 hours.  She has had palpitations when she is coughing for the last 3 days. Patient reports chronic headaches that are better with tylenol. She has no change in vision or hearing. Patient has not had dyspnea. She reports that she does have weakness greater on the right than the left, but that this has been present for 4-5 years. She denies cp, sob, n/v/d, abd pain.  She does not smoke, does not drink, and does not use illicit drugs. Patient is DNR.   Assessment/Plan: Rhabdomyolysis -CPK peaking 13,299>>6204>>3532>>1977>>896 -initiated bicarbonate drip>>d/c on 8/14 -continue NS -Holding Lipitor   COVID-19 infection -Stable on room air with saturation 94-95% -Started remdesivir--finished 5 days -CRP 0.6>>2.9>>2.9>>2.6>>2.4 -Ferritin 159 -D-dimer 6.98>>3.93>>2.52>>2.18>>1.97   Essential hypertension -Continue amlodipine and losartan -Continue metoprolol succinate   CKD stage IIIa -Baseline creatinine 0.9-1.2 -Monitor BMP   Transaminasemia -Secondary COVID-19 infection and  rhabdomyolysis -RUQ US--Coarsened echotexture and nodular surface contour of the liver suggesting mild cirrhosis;  Geographic region of increased echogenicity in the left hepatic lobe may be related to focal fatty change-->outpt MR liver -improving   Depression/anxiety -Continue trazodone and Effexor   Acute Leg DVTs -11/22/20 venous duplex--for bilateral popliteal DVT with extension into the calf tibial and peroneal veins -started apixaban   Constipation -start miralax and senna--hold now with loose stool -restart linzess -bisacodyl supp given 8/12 -added lactulose--hold now with loose stool   Hypokalemia -replete   Right Flank Rash -?injury from fall vs early Herpes Zoster -started valtrex 8/12       Status is: Inpatient   Remains inpatient appropriate because:Inpatient level of care appropriate due to severity of illness   Dispo: The patient is from: Home              Anticipated d/c is to: Home              Patient currently is not medically stable to d/c.              Difficult to place patient No               Family Communication:   sister updated 8/12   Consultants:  none   Code Status:  FULL   DVT Prophylaxis:  apixaban     Procedures: As Listed in Progress Note Above   Antibiotics: None    Subjective: Patient had 5 loose BMs in last 24 hours.  Denies f/c, cp, sob, n/v/ abd pain, hematochezia, melena  Objective: Vitals:  11/25/20 0525 11/25/20 1415 11/25/20 2014 11/26/20 0539  BP: (!) 153/45 (!) 144/73 (!) 174/60 (!) 153/53  Pulse: 65 (!) 57 70 (!) 58  Resp: '17 18 19 19  '$ Temp: 98.3 F (36.8 C) 98.3 F (36.8 C) 97.9 F (36.6 C) 98 F (36.7 C)  TempSrc: Oral Oral Oral Oral  SpO2: 94% 94% 99% 94%  Weight:      Height:        Intake/Output Summary (Last 24 hours) at 11/26/2020 1218 Last data filed at 11/26/2020 0259 Gross per 24 hour  Intake 200 ml  Output 1001 ml  Net -801 ml   Weight change:  Exam:  General:  Pt is alert,  follows commands appropriately, not in acute distress HEENT: No icterus, No thrush, No neck mass, [place]/AT Cardiovascular: RRR, S1/S2, no rubs, no gallops Respiratory: fine bibasilar crackles. No wheeze Abdomen: Soft/+BS, non tender, non distended, no guarding Extremities: 1 +LE edema, No lymphangitis, No petechiae, No rashes, no synovitis   Data Reviewed: I have personally reviewed following labs and imaging studies Basic Metabolic Panel: Recent Labs  Lab 11/22/20 0533 11/23/20 0534 11/24/20 0439 11/25/20 0655 11/26/20 0617  NA 139 138 141 141 139  K 3.9 3.6 3.4* 3.8 3.3*  CL 102 101 101 99 103  CO2 29 31 34* 36* 27  GLUCOSE 100* 115* 113* 113* 105*  BUN 40* '23 20 20 20  '$ CREATININE 1.06* 0.87 0.78 0.82 0.84  CALCIUM 8.4* 7.9* 7.8* 8.0* 7.3*  MG  --  2.5*  --   --   --    Liver Function Tests: Recent Labs  Lab 11/22/20 0533 11/23/20 0534 11/24/20 0439 11/25/20 0655 11/26/20 0617  AST 241* 210* 163* 129* 89*  ALT 111* 104* 97* 94* 80*  ALKPHOS 76 60 57 57 53  BILITOT 1.0 1.0 0.8 0.9 1.0  PROT 5.4* 4.8* 4.4* 4.5* 4.1*  ALBUMIN 2.8* 2.4* 2.2* 2.2* 2.0*   No results for input(s): LIPASE, AMYLASE in the last 168 hours. No results for input(s): AMMONIA in the last 168 hours. Coagulation Profile: No results for input(s): INR, PROTIME in the last 168 hours. CBC: Recent Labs  Lab 11/22/20 0052 11/22/20 0533 11/23/20 0534 11/24/20 0439 11/25/20 0655 11/26/20 0617  WBC 21.0* 18.8* 10.6* 9.1 10.2 10.3  NEUTROABS 16.3*  --   --   --   --   --   HGB 14.4 13.5 12.9 11.8* 11.4* 10.6*  HCT 42.8 41.0 40.2 36.7 35.5* 33.7*  MCV 98.2 98.6 100.2* 100.3* 102.3* 102.4*  PLT 245 210 183 169 181 170   Cardiac Enzymes: Recent Labs  Lab 11/22/20 0533 11/23/20 0534 11/24/20 0439 11/25/20 0655 11/26/20 0617  CKTOTAL 13,299* 6,204* 3,532* 1,977* 896*   BNP: Invalid input(s): POCBNP CBG: Recent Labs  Lab 11/25/20 1120 11/25/20 2017 11/25/20 2122 11/26/20 0747  11/26/20 1206  GLUCAP 192* 135* 148* 101* 145*   HbA1C: No results for input(s): HGBA1C in the last 72 hours. Urine analysis:    Component Value Date/Time   COLORURINE YELLOW 11/22/2020 0053   APPEARANCEUR CLEAR 11/22/2020 0053   LABSPEC 1.012 11/22/2020 0053   PHURINE 6.0 11/22/2020 0053   GLUCOSEU NEGATIVE 11/22/2020 0053   HGBUR LARGE (A) 11/22/2020 0053   BILIRUBINUR NEGATIVE 11/22/2020 0053   KETONESUR NEGATIVE 11/22/2020 0053   PROTEINUR 30 (A) 11/22/2020 0053   UROBILINOGEN 0.2 04/25/2014 1355   NITRITE NEGATIVE 11/22/2020 0053   LEUKOCYTESUR NEGATIVE 11/22/2020 0053   Sepsis Labs: '@LABRCNTIP'$ (procalcitonin:4,lacticidven:4) ) Recent Results (from  the past 240 hour(s))  Resp Panel by RT-PCR (Flu A&B, Covid) Urine, Clean Catch     Status: Abnormal   Collection Time: 11/22/20 12:53 AM   Specimen: Urine, Clean Catch; Nasopharyngeal(NP) swabs in vial transport medium  Result Value Ref Range Status   SARS Coronavirus 2 by RT PCR POSITIVE (A) NEGATIVE Final    Comment: RESULT CALLED TO, READ BACK BY AND VERIFIED WITH: Spence,H'@0303'$  by Zigmund Daniel, b 8.10.22 (NOTE) SARS-CoV-2 target nucleic acids are DETECTED.  The SARS-CoV-2 RNA is generally detectable in upper respiratory specimens during the acute phase of infection. Positive results are indicative of the presence of the identified virus, but do not rule out bacterial infection or co-infection with other pathogens not detected by the test. Clinical correlation with patient history and other diagnostic information is necessary to determine patient infection status. The expected result is Negative.  Fact Sheet for Patients: EntrepreneurPulse.com.au  Fact Sheet for Healthcare Providers: IncredibleEmployment.be  This test is not yet approved or cleared by the [place] FDA and  has been authorized for detection and/or diagnosis of SARS-CoV-2 by FDA under an Emergency Use  Authorization (EUA).  This EUA will remain in effect (meaning this test can  be used) for the duration of  the COVID-19 declaration under Section 564(b)(1) of the Act, 21 U.S.C. section 360bbb-3(b)(1), unless the authorization is terminated or revoked sooner.     Influenza A by PCR NEGATIVE NEGATIVE Final   Influenza B by PCR NEGATIVE NEGATIVE Final    Comment: (NOTE) The Xpert Xpress SARS-CoV-2/FLU/RSV plus assay is intended as an aid in the diagnosis of influenza from Nasopharyngeal swab specimens and should not be used as a sole basis for treatment. Nasal washings and aspirates are unacceptable for Xpert Xpress SARS-CoV-2/FLU/RSV testing.  Fact Sheet for Patients: EntrepreneurPulse.com.au  Fact Sheet for Healthcare Providers: IncredibleEmployment.be  This test is not yet approved or cleared by the [place] FDA and has been authorized for detection and/or diagnosis of SARS-CoV-2 by FDA under an Emergency Use Authorization (EUA). This EUA will remain in effect (meaning this test can be used) for the duration of the COVID-19 declaration under Section 564(b)(1) of the Act, 21 U.S.C. section 360bbb-3(b)(1), unless the authorization is terminated or revoked.  Performed at [facility], [address], [place], [place] [address]      Scheduled Meds:  amLODipine  2.5 mg Oral Daily   apixaban  10 mg Oral BID   Followed by   Derrill Memo ON 11/30/2020] apixaban  5 mg Oral BID   darifenacin  7.5 mg Oral Daily   insulin aspart  0-9 Units Subcutaneous TID WC   lactulose  30 g Oral BID   linaclotide  72 mcg Oral QAC breakfast   losartan  25 mg Oral Daily   metoprolol succinate  25 mg Oral Daily   nystatin   Topical TID   polyethylene glycol  17 g Oral Daily   senna  2 tablet Oral Daily   traZODone  100 mg Oral QHS   valACYclovir  1,000 mg Oral TID   Continuous Infusions:  sodium chloride 100 mL/hr at 11/26/20 [id number]    Procedures/Studies: DG  Chest 2 View  Result Date: 11/22/2020 CLINICAL DATA:  Recent fall with chest pain, initial encounter EXAM: CHEST - 2 VIEW COMPARISON:  06/12/2013 FINDINGS: Cardiac shadow is within normal limits. Aortic calcifications are noted. Postsurgical changes are noted in the cervical spine. Lungs are well aerated bilaterally. No focal infiltrate or sizable effusion is seen.  Mild scarring is noted in the left base. No bony abnormality is noted. IMPRESSION: No acute abnormality noted. Electronically Signed   By: Inez Catalina M.D.   On: 11/22/2020 02:18   DG Lumbar Spine Complete  Result Date: 11/22/2020 CLINICAL DATA:  Recent fall with low back pain, initial encounter EXAM: LUMBAR SPINE - COMPLETE 4+ VIEW COMPARISON:  05/27/2013 FINDINGS: Five lumbar type vertebral bodies are well visualized. Changes of prior fusion are noted from L2-S1 with posterior fixation. The overall appearance is similar to that seen on the prior exam. No hardware failure is noted. Degenerative changes at L1-2 are seen. Fecal material is noted within the colon consistent with mild constipation. IMPRESSION: Degenerative change without acute abnormality. Mild colonic constipation. Electronically Signed   By: Inez Catalina M.D.   On: 11/22/2020 02:21   CT HEAD WO CONTRAST (5MM)  Result Date: 11/22/2020 CLINICAL DATA:  Found on floor. EXAM: CT HEAD WITHOUT CONTRAST TECHNIQUE: Contiguous axial images were obtained from the base of the skull through the vertex without intravenous contrast. COMPARISON:  05/27/2013 FINDINGS: Brain: Age related volume loss. No acute intracranial abnormality. Specifically, no hemorrhage, hydrocephalus, mass lesion, acute infarction, or significant intracranial injury. Vascular: No hyperdense vessel or unexpected calcification. Skull: No acute calvarial abnormality. Sinuses/Orbits: No acute findings Other: None IMPRESSION: No acute intracranial abnormality. Electronically Signed   By: Rolm Baptise M.D.   On: 11/22/2020  01:34   CT Cervical Spine Wo Contrast  Result Date: 11/22/2020 CLINICAL DATA:  Neck trauma (Age >= 65y).  Found down. EXAM: CT CERVICAL SPINE WITHOUT CONTRAST TECHNIQUE: Multidetector CT imaging of the cervical spine was performed without intravenous contrast. Multiplanar CT image reconstructions were also generated. COMPARISON:  MRI 06/24/2018 FINDINGS: Alignment: Minimal 2 mm degenerative anterolisthesis of C2 on C3. Skull base and vertebrae: No acute fracture. No primary bone lesion or focal pathologic process. Soft tissues and spinal canal: No prevertebral fluid or swelling. No visible canal hematoma. Disc levels: Anterior fusion from C3-C6. Diffuse advanced degenerative disc and facet disease. Upper chest: No acute findings Other: None IMPRESSION: Anterior fusion changes from C3-C6. Diffuse degenerative disc and facet disease. No acute bony abnormality. Electronically Signed   By: Rolm Baptise M.D.   On: 11/22/2020 01:34   US Venous Img Lower Bilateral (DVT)  Result Date: 11/22/2020 CLINICAL DATA:  Bilateral lower extremity pain and edema worse on the right EXAM: BILATERAL LOWER EXTREMITY VENOUS DOPPLER ULTRASOUND TECHNIQUE: Gray-scale sonography with graded compression, as well as color Doppler and duplex ultrasound were performed to evaluate the lower extremity deep venous systems from the level of the common femoral vein and including the common femoral, femoral, profunda femoral, popliteal and calf veins including the posterior tibial, peroneal and gastrocnemius veins when visible. The superficial great saphenous vein was also interrogated. Spectral Doppler was utilized to evaluate flow at rest and with distal augmentation maneuvers in the common femoral, femoral and popliteal veins. COMPARISON:  None. FINDINGS: RIGHT LOWER EXTREMITY Common Femoral Vein: No evidence of thrombus. Normal compressibility, respiratory phasicity and response to augmentation. Saphenofemoral Junction: No evidence of  thrombus. Normal compressibility and flow on color Doppler imaging. Profunda Femoral Vein: No evidence of thrombus. Normal compressibility and flow on color Doppler imaging. Femoral Vein: No evidence of thrombus. Normal compressibility, respiratory phasicity and response to augmentation. Popliteal Vein: Hypoechoic intraluminal thrombus appearing occlusive. Vessel noncompressible. Phasic flow not demonstrated. Augmentation not performed Calf Veins: Thrombus does appear to extend into the calf tibial and peroneal veins. LEFT LOWER EXTREMITY  Common Femoral Vein: No evidence of thrombus. Normal compressibility, respiratory phasicity and response to augmentation. Saphenofemoral Junction: No evidence of thrombus. Normal compressibility and flow on color Doppler imaging. Profunda Femoral Vein: No evidence of thrombus. Normal compressibility and flow on color Doppler imaging. Femoral Vein: No evidence of thrombus. Normal compressibility, respiratory phasicity and response to augmentation. Popliteal Vein: Hypoechoic intraluminal thrombus. Thrombus appears occlusive. Vessel noncompressible. No phasic flow. Augmentation not performed. Calf Veins: Thrombus does appear to extend into the left calf tibial and peroneal veins. IMPRESSION: Positive exam for bilateral popliteal DVT with extension into the calf tibial and peroneal veins Electronically Signed   By: Jerilynn Mages.  Shick M.D.   On: 11/22/2020 11:45   DG Knee Complete 4 Views Left  Result Date: 11/22/2020 CLINICAL DATA:  Recent fall with left knee pain, initial encounter EXAM: LEFT KNEE - COMPLETE 4+ VIEW COMPARISON:  07/04/2018 FINDINGS: Left knee prosthesis is noted in satisfactory position. No acute fracture or dislocation is noted. No joint effusion is seen. Vascular calcifications are seen. IMPRESSION: No acute abnormality noted. Electronically Signed   By: Inez Catalina M.D.   On: 11/22/2020 02:18   DG Knee Complete 4 Views Right  Result Date: 11/22/2020 CLINICAL DATA:   Recent fall with knee pain, initial encounter EXAM: RIGHT KNEE - COMPLETE 4+ VIEW COMPARISON:  11/29/2011 FINDINGS: Right knee prosthesis is noted. No joint effusion is seen. No acute fracture or dislocation is seen. Vascular calcifications are noted. IMPRESSION: No acute abnormality noted. Electronically Signed   By: Inez Catalina M.D.   On: 11/22/2020 02:20   US Abdomen Limited RUQ (LIVER/GB)  Result Date: 11/22/2020 CLINICAL DATA:  Abnormal LFTs EXAM: ULTRASOUND ABDOMEN LIMITED RIGHT UPPER QUADRANT COMPARISON:  CT abdomen/pelvis 05/27/2013 FINDINGS: Gallbladder: The gallbladder is surgically absent. Common bile duct: Diameter: 5 mm Liver: The liver demonstrates diffusely coarsened echotexture with a nodular surface contour. There is a geographic region of increased echogenicity in the left hepatic lobe measuring 3.6 cm x 1.9 cm x 3.0 cm. Portal vein is patent on color Doppler imaging with normal direction of blood flow towards the liver. Other: None. IMPRESSION: 1. Coarsened echotexture and nodular surface contour of the liver suggesting mild cirrhosis. 2. Geographic region of increased echogenicity in the left hepatic lobe may be related to focal fatty change; however, dedicated nonemergent liver protocol MRI is recommended to exclude underlying mass lesion. Electronically Signed   By: Valetta Mole MD   On: 11/22/2020 12:00    Orson Eva, DO  [facility]  If 7PM-7AM, please contact night-coverage [email or web address] Password [email or web address] 11/26/2020, 12:18 PM   LOS: 4 days

## 2020-11-27 DIAGNOSIS — T796XXD Traumatic ischemia of muscle, subsequent encounter: Secondary | ICD-10-CM

## 2020-11-27 LAB — CBC
HCT: 32.9 % — ABNORMAL LOW (ref 36.0–46.0)
Hemoglobin: 10.3 g/dL — ABNORMAL LOW (ref 12.0–15.0)
MCH: 32.6 pg (ref 26.0–34.0)
MCHC: 31.3 g/dL (ref 30.0–36.0)
MCV: 104.1 fL — ABNORMAL HIGH (ref 80.0–100.0)
Platelets: 170 10*3/uL (ref 150–400)
RBC: 3.16 MIL/uL — ABNORMAL LOW (ref 3.87–5.11)
RDW: 14 % (ref 11.5–15.5)
WBC: 10.1 10*3/uL (ref 4.0–10.5)
nRBC: 0 % (ref 0.0–0.2)

## 2020-11-27 LAB — COMPREHENSIVE METABOLIC PANEL
ALT: 69 U/L — ABNORMAL HIGH (ref 0–44)
AST: 66 U/L — ABNORMAL HIGH (ref 15–41)
Albumin: 2 g/dL — ABNORMAL LOW (ref 3.5–5.0)
Alkaline Phosphatase: 60 U/L (ref 38–126)
Anion gap: 5 (ref 5–15)
BUN: 23 mg/dL (ref 8–23)
CO2: 31 mmol/L (ref 22–32)
Calcium: 8.1 mg/dL — ABNORMAL LOW (ref 8.9–10.3)
Chloride: 106 mmol/L (ref 98–111)
Creatinine, Ser: 0.98 mg/dL (ref 0.44–1.00)
GFR, Estimated: 58 mL/min — ABNORMAL LOW (ref >60)
Glucose, Bld: 95 mg/dL (ref 70–99)
Potassium: 4.7 mmol/L (ref 3.5–5.1)
Sodium: 142 mmol/L (ref 135–145)
Total Bilirubin: 0.7 mg/dL (ref 0.3–1.2)
Total Protein: 4.2 g/dL — ABNORMAL LOW (ref 6.5–8.1)

## 2020-11-27 LAB — MAGNESIUM: Magnesium: 2.2 mg/dL (ref 1.7–2.4)

## 2020-11-27 LAB — C-REACTIVE PROTEIN: CRP: 1.8 mg/dL — ABNORMAL HIGH (ref <1.0)

## 2020-11-27 LAB — CK: Total CK: 497 U/L — ABNORMAL HIGH (ref 38–234)

## 2020-11-27 LAB — D-DIMER, QUANTITATIVE: D-Dimer, Quant: 1.65 ug/mL-FEU — ABNORMAL HIGH (ref 0.00–0.50)

## 2020-11-27 LAB — GLUCOSE, CAPILLARY
Glucose-Capillary: 81 mg/dL (ref 70–99)
Glucose-Capillary: 128 mg/dL — ABNORMAL HIGH (ref 70–99)

## 2020-11-27 MED ORDER — VALACYCLOVIR HCL 1 G PO TABS: 1000.0000 mg | ORAL_TABLET | Freq: Three times a day (TID) | ORAL | 0 refills | Status: DC

## 2020-11-27 MED ORDER — POLYETHYLENE GLYCOL 3350 17 G PO PACK: 17.0000 g | PACK | Freq: Every day | ORAL | 0 refills | Status: AC

## 2020-11-27 MED ORDER — APIXABAN 5 MG PO TABS: 10.0000 mg | ORAL_TABLET | Freq: Two times a day (BID) | ORAL | 1 refills | Status: DC

## 2020-11-27 MED ORDER — POLYETHYLENE GLYCOL 3350 17 G PO PACK
17.0000 g | PACK | Freq: Every day | ORAL | Status: DC
  Administered 2020-11-27: 17 g via ORAL
  Filled 2020-11-27: qty 1

## 2020-11-27 NOTE — Progress Notes (Signed)
Physical Therapy Treatment Patient Details Name: Leslie Hendricks MRN: [id number] DOB: 09/01/1940 Today's Date: 11/27/2020    History of Present Illness Leslie Hendricks  is a 80 y.o. female, with history of anemia, DMII, frequent falls, vision loss 2/2 macular degeneration, GERD, headache, HLD, HTN, and more presents to the ED with a chief complaint of fall. Patient reports that she fell in her closet. She was walking along when her legs became so weak, they just gave out from under her. She reports that she hit under her chin, but did not hit her head, did not lose consciousness. She had no preceding chest pain, dizziness, or dyspnea. She reports that she has been getting progressively weaker since she was diagnosed with covid. Patient was not able to get up on her own. A family member called EMS upon discovering that patient was on the floor, and the paramedics had to help get her up. She thinks she was down approx 4 hours, but irritation/early wounds would seem like she was down longer than 4 hours. Patient reports that she has had a cough productive of clear sputum. She has had palpitations when she is coughing for the last 3 days. Patient reports chronic headaches that are better with tylenol. She has no change in vision or hearing. Patient has not had dyspnea. She reports that she does have weakness greater on the right than the left, but that this has been present for 4-5 years. She has had a tmax as high as 101 one week ago. She reports her physician gave her pills for covid, but she doesn't think they were called "paxlovid" and she doesn't remember the name of them. Patient reports that she has had a normal appetite at home. She also has chronic peripheral edema. Patient has no other complaints at this time.    PT Comments    Patient presents seated in chair (assisted by nursing staff) and agreeable for therapy.  Patient requires 3-4 minute rest break in between sets of BLE exercises due to  fatigue, demonstrates slow labored movement and limited to taking a few steps at bedside before having to sit due to fatigue and generalized weakness.  Patient tolerated staying up in chair after therapy.  Patient will benefit from continued physical therapy in hospital and recommended venue below to increase strength, balance, endurance for safe ADLs and gait.     Follow Up Recommendations  SNF;Supervision for mobility/OOB;Supervision - Intermittent     Equipment Recommendations  None recommended by PT    Recommendations for Other Services       Precautions / Restrictions Restrictions Weight Bearing Restrictions: No    Mobility  Bed Mobility               General bed mobility comments: presents seated in chair (assisted by nurisng staff)    Transfers Overall transfer level: Needs assistance   Transfers: Sit to/from Stand;Stand Pivot Transfers Sit to Stand: Min guard;Min assist Stand pivot transfers: Min assist       General transfer comment: increased time, slow labored movement  Ambulation/Gait Ambulation/Gait assistance: Min assist;Mod assist Gait Distance (Feet): 10 Feet Assistive device: Rolling walker (2 wheeled) Gait Pattern/deviations: Decreased step length - right;Decreased step length - left;Decreased stride length Gait velocity: decreased   General Gait Details: demonstrates slow labored unsteady steps forward/backwars, limited to bedside due to c/o fatigue and fair/poor standing balance using RW   Chief Strategy Officer  Modified Rankin (Stroke Patients Only)       Balance Overall balance assessment: Needs assistance Sitting-balance support: Feet supported;No upper extremity supported Sitting balance-Leahy Scale: Good Sitting balance - Comments: seated in chair   Standing balance support: During functional activity;Bilateral upper extremity supported Standing balance-Leahy Scale: Poor Standing balance comment:  fair/poor using RW                            Cognition Arousal/Alertness: Awake/alert Behavior During Therapy: WFL for tasks assessed/performed Overall Cognitive Status: Within Functional Limits for tasks assessed                                        Exercises General Exercises - Lower Extremity Long Arc Quad: Seated;AROM;Strengthening;Both;10 reps Hip Flexion/Marching: Seated;AROM;Strengthening;Both;10 reps Toe Raises: Seated;AROM;Strengthening;Both;10 reps Heel Raises: Seated;AROM;Strengthening;Both;10 reps    General Comments        Pertinent Vitals/Pain Pain Assessment: Faces Faces Pain Scale: Hurts little more Pain Location: left knee Pain Descriptors / Indicators: Sore Pain Intervention(s): Limited activity within patient's tolerance;Monitored during session;Repositioned    Home Living                      Prior Function            PT Goals (current goals can now be found in the care plan section) Acute Rehab PT Goals Patient Stated Goal: go to rehab and go home PT Goal Formulation: With patient Time For Goal Achievement: 12/07/20 Potential to Achieve Goals: Good Progress towards PT goals: Progressing toward goals    Frequency    Min 3X/week      PT Plan Current plan remains appropriate    Co-evaluation              AM-PAC PT "6 Clicks" Mobility   Outcome Measure  Help needed turning from your back to your side while in a flat bed without using bedrails?: A Little Help needed moving from lying on your back to sitting on the side of a flat bed without using bedrails?: A Little Help needed moving to and from a bed to a chair (including a wheelchair)?: A Lot Help needed standing up from a chair using your arms (e.g., wheelchair or bedside chair)?: A Little Help needed to walk in hospital room?: A Lot Help needed climbing 3-5 steps with a railing? : A Lot 6 Click Score: 15    End of Session   Activity  Tolerance: Patient tolerated treatment well;Patient limited by fatigue Patient left: in chair;with call bell/phone within reach Nurse Communication: Mobility status PT Visit Diagnosis: Other abnormalities of gait and mobility (R26.89);Unsteadiness on feet (R26.81);Repeated falls (R29.6);Muscle weakness (generalized) (M62.81)     Time: [id number] PT Time Calculation (min) (ACUTE ONLY): 33 min  Charges:  $Therapeutic Exercise: 8-22 mins $Therapeutic Activity: 8-22 mins                     1:55 PM, 11/27/20 Lonell Grandchild, MPT Physical Therapist with [facility] 336 [phone] office [phone] mobile phone

## 2020-11-27 NOTE — Progress Notes (Signed)
Patient discharged home today, transported by sister home. Discharge paperwork went over with patient and sister, both verbalized understanding. Patient received eliquis voucher from pharmacy. Patient and sister informed about side effects and things to watch for. Belongings sent home with patient. Patient stable upon discharge.

## 2020-11-27 NOTE — TOC Transition Note (Addendum)
Transition of Care [facility]) - CM/SW Discharge Note   Patient Details  Name: LATIQUA GRANDPRE MRN: [id number] Date of Birth: 30-Jun-1940  Transition of Care [facility]) CM/SW Contact:  [name], RN Phone Number: 11/27/2020, 11:52 AM   Clinical Narrative:   Patient medically ready to discharge home. Patient needs SNF, she is COVID positive and has a bed offer at [facility], however they can not admit her until 8/22. They are agreeable to take her Monday. Patient is going home with her sister today and [name] will follow up with [facility]. TOC sent FL2 and DC summary in hub and left [name] a message.  Calvin with [facility] updated with DC plan.   Addendum : Patient is requesting bed side commode and rolling walker. Caryl Pina with [facility] accepted the referral. They will drop ship to [facility] [employer] [facility] , [place] [address], Patient updated.     Final next level of care: [place] Barriers to Discharge: Barriers Resolved   Patient Goals and CMS Choice Patient states their goals for this hospitalization and ongoing recovery are:: agreeable to SNF.     Discharge Placement          Patient to be transferred to facility by: Sister Name of family member notified: [name] Patient and family notified of of transfer: 11/27/20  Discharge Plan and Services In-house Referral: Clinical Social Work           HH Arranged: Therapist, sports, Social Work [employer] Agency: Microbiologist ([place]) Date [place]: 11/24/20 Time [place]: 1500 Representative spoke with at [place]: Romualdo Bolk   Readmission Risk Interventions Readmission Risk Prevention Plan 11/27/2020 11/24/2020 11/22/2020  Transportation Screening Complete Complete Complete  PCP or Specialist Appt within 3-5 Days Complete Not Complete -  [place] or [place] - Complete Complete  Social Work Consult for [place] Planning/Counseling - Complete Complete  Palliative Care  Screening - Not Applicable Not Applicable  Medication Review Press photographer) - Complete Complete  Some recent data might be hidden

## 2020-12-06 ENCOUNTER — Encounter (INDEPENDENT_AMBULATORY_CARE_PROVIDER_SITE_OTHER): Payer: Medicare Other | Admitting: Ophthalmology

## 2020-12-12 ENCOUNTER — Institutional Professional Consult (permissible substitution): Payer: Medicare Other | Admitting: Internal Medicine

## 2021-01-08 ENCOUNTER — Other Ambulatory Visit: Payer: Self-pay

## 2021-01-08 ENCOUNTER — Encounter (INDEPENDENT_AMBULATORY_CARE_PROVIDER_SITE_OTHER): Payer: Self-pay | Admitting: Ophthalmology

## 2021-01-08 ENCOUNTER — Ambulatory Visit (INDEPENDENT_AMBULATORY_CARE_PROVIDER_SITE_OTHER): Payer: Medicare Other | Admitting: Ophthalmology

## 2021-01-08 DIAGNOSIS — H353221 Exudative age-related macular degeneration, left eye, with active choroidal neovascularization: Secondary | ICD-10-CM

## 2021-01-08 MED ORDER — AFLIBERCEPT 2MG/0.05ML IZ SOLN FOR KALEIDOSCOPE
2.0000 mg | INTRAVITREAL | Status: AC | PRN
  Administered 2021-01-08: 2 mg via INTRAVITREAL
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
## 2021-01-08 NOTE — Assessment & Plan Note (Signed)
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  The subretinal hemorrhage component continues to be resolved.  At 13-week interval today (due to a stint and rehabilitation center), repeat injection today to control subretinal fluid and follow-up again in 8 to 9 weeks

## 2021-01-09 ENCOUNTER — Ambulatory Visit (INDEPENDENT_AMBULATORY_CARE_PROVIDER_SITE_OTHER): Payer: Medicare Other | Admitting: Podiatry

## 2021-01-09 DIAGNOSIS — M79676 Pain in unspecified toe(s): Secondary | ICD-10-CM

## 2021-01-09 DIAGNOSIS — D2372 Other benign neoplasm of skin of left lower limb, including hip: Secondary | ICD-10-CM | POA: Diagnosis not present

## 2021-01-09 DIAGNOSIS — B351 Tinea unguium: Secondary | ICD-10-CM

## 2021-01-09 DIAGNOSIS — D2371 Other benign neoplasm of skin of right lower limb, including hip: Secondary | ICD-10-CM | POA: Diagnosis not present

## 2021-01-09 NOTE — Progress Notes (Signed)
She presents today states that her feet are doing fine other than long nails and calluses.  Objective: Pulses are palpable.  Toenails are long thick yellow dystrophic Lee mycotic reactive hyperkeratotic lesions plantar medial aspect of the forefoot.  No open lesions or wounds.  Assessment: Pain in limb secondary to onychomycosis and benign skin lesions.  Plan: Debridement of benign skin lesions debridement of nails 1 through 5 bilateral.
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
## 2021-01-22 ENCOUNTER — Ambulatory Visit (INDEPENDENT_AMBULATORY_CARE_PROVIDER_SITE_OTHER): Payer: Medicare Other | Admitting: Adult Health

## 2021-01-22 ENCOUNTER — Encounter: Payer: Self-pay | Admitting: Adult Health

## 2021-01-22 ENCOUNTER — Other Ambulatory Visit: Payer: Self-pay

## 2021-01-22 VITALS — BP 122/60 | HR 60 | Ht 63.0 in | Wt 225.0 lb

## 2021-01-22 DIAGNOSIS — I10 Essential (primary) hypertension: Secondary | ICD-10-CM | POA: Diagnosis not present

## 2021-01-22 DIAGNOSIS — E7800 Pure hypercholesterolemia, unspecified: Secondary | ICD-10-CM

## 2021-01-22 DIAGNOSIS — I82403 Acute embolism and thrombosis of unspecified deep veins of lower extremity, bilateral: Secondary | ICD-10-CM | POA: Diagnosis not present

## 2021-01-22 DIAGNOSIS — I25710 Atherosclerosis of autologous vein coronary artery bypass graft(s) with unstable angina pectoris: Secondary | ICD-10-CM | POA: Diagnosis not present

## 2021-01-22 DIAGNOSIS — I5032 Chronic diastolic (congestive) heart failure: Secondary | ICD-10-CM

## 2021-01-22 DIAGNOSIS — T796XXD Traumatic ischemia of muscle, subsequent encounter: Secondary | ICD-10-CM

## 2021-01-22 NOTE — Patient Instructions (Signed)
Medication Instructions:  No Changes *If you need a refill on your cardiac medications before your next appointment, please call your pharmacy*   Lab Work: BNP, BMET, CBC.  If you have labs (blood work) drawn today and your tests are completely normal, you will receive your results only by: [place] (if you have MyChart) OR A paper copy in the mail If you have any lab test that is abnormal or we need to change your treatment, we will call you to review the results.   Testing/Procedures: No Testing   Follow-Up: At [facility], you and your health needs are our priority.  As part of our continuing mission to provide you with exceptional heart care, we have created designated Provider Care Teams.  These Care Teams include your primary Cardiologist (physician) and Advanced Practice Providers (APPs -  Physician Assistants and Nurse Practitioners) who all work together to provide you with the care you need, when you need it.    Your next appointment:   March 14, 2021 2:30 PM  The format for your next appointment:   In Person  Provider:   Quay Burow, MD

## 2021-01-23 LAB — CBC
Hematocrit: 42.4 % (ref 34.0–46.6)
Hemoglobin: 14.2 g/dL (ref 11.1–15.9)
MCH: 33.3 pg — ABNORMAL HIGH (ref 26.6–33.0)
MCHC: 33.5 g/dL (ref 31.5–35.7)
MCV: 100 fL — ABNORMAL HIGH (ref 79–97)
Platelets: 208 10*3/uL (ref 150–450)
RBC: 4.26 x10E6/uL (ref 3.77–5.28)
RDW: 12.9 % (ref 11.7–15.4)
WBC: 7.7 10*3/uL (ref 3.4–10.8)

## 2021-01-23 LAB — BRAIN NATRIURETIC PEPTIDE: BNP: 100.2 pg/mL — ABNORMAL HIGH (ref 0.0–100.0)

## 2021-01-23 LAB — BASIC METABOLIC PANEL
BUN/Creatinine Ratio: 19 (ref 12–28)
BUN: 23 mg/dL (ref 8–27)
CO2: 25 mmol/L (ref 20–29)
Calcium: 9.8 mg/dL (ref 8.7–10.3)
Chloride: 103 mmol/L (ref 96–106)
Creatinine, Ser: 1.2 mg/dL — ABNORMAL HIGH (ref 0.57–1.00)
Glucose: 73 mg/dL (ref 70–99)
Potassium: 5.2 mmol/L (ref 3.5–5.2)
Sodium: 137 mmol/L (ref 134–144)
eGFR: 45 mL/min/{1.73_m2} — ABNORMAL LOW (ref >59)

## 2021-01-24 ENCOUNTER — Telehealth: Payer: Self-pay

## 2021-01-24 ENCOUNTER — Other Ambulatory Visit: Payer: Self-pay

## 2021-01-24 DIAGNOSIS — I10 Essential (primary) hypertension: Secondary | ICD-10-CM

## 2021-01-24 NOTE — Telephone Encounter (Signed)
Spoke with patient sister Oneta Rack regarding blood test. Advised patients siter repeat BMET to be done in 1 week.

## 2021-01-24 NOTE — Telephone Encounter (Deleted)
Spoke with patients sister regarding test results. Advised sister patient will need a repeat BMET in 1 Week.----- Message from Lendon Colonel, NP sent at 01/23/2021  9:50 AM EDT ----- I have reviewed her labs. Please have her reduce her Lasix to 40 mg daily and stop potassium. Please repeat BMET in one week. Still waiting on BNP results. CBC is okay.  Curt Bears

## 2021-01-25 ENCOUNTER — Other Ambulatory Visit: Payer: Self-pay

## 2021-01-31 ENCOUNTER — Other Ambulatory Visit: Payer: Self-pay | Admitting: Cardiovascular Disease

## 2021-02-01 ENCOUNTER — Telehealth: Payer: Self-pay | Admitting: Adult Health

## 2021-02-01 NOTE — Telephone Encounter (Signed)
Patient called and notified no physical paperwork is needed for BMET test (ordered 01/24/21). Advised she go to a [facility] location and they can access her active orders. Notified her she does not need to be fasting.

## 2021-02-01 NOTE — Telephone Encounter (Signed)
Leslie Hendricks is calling wanting to check on the status of the lab orders that she was told would be mailed out to her on 01/22/21 so she could have them repeated. They have not been received yet.

## 2021-02-02 ENCOUNTER — Other Ambulatory Visit: Payer: Self-pay | Admitting: Cardiovascular Disease

## 2021-02-17 DIAGNOSIS — M7552 Bursitis of left shoulder: Secondary | ICD-10-CM | POA: Insufficient documentation

## 2021-02-19 ENCOUNTER — Telehealth: Payer: Self-pay

## 2021-02-19 NOTE — Telephone Encounter (Signed)
   [facility] Pre-operative Risk Assessment    Patient Name: Leslie Hendricks  DOB: Jul 25, 1940 MRN: [id number]   Request for surgical clearance:  What type of surgery is being performed? COLONOSCOPY  When is this surgery scheduled? 02-27-2021  What type of clearance is required (medical clearance vs. Pharmacy clearance to hold med vs. Both)? BOTH  Are there any medications that need to be held prior to surgery and how long? [place] name and name of physician performing surgery? [place]   What is the office phone number? [phone]   7.   What is the office fax number? [phone]  8.   Anesthesia type (None, local, MAC, general) ? PROPOFOL

## 2021-02-19 NOTE — Telephone Encounter (Signed)
Please double check with requesting provider to see if the procedure is urgent.  Patient was recently diagnosed with bilateral lower extremity DVT on 11/22/2020.  At this time, she has not completed 54-month course of anticoagulation therapy.  If this is her routine colonoscopy, ideally should be delayed until 6 months after diagnosis of DVT.

## 2021-02-19 NOTE — Telephone Encounter (Signed)
Left message for [facility], surgery scheduler for Dr. Benson Norway. Left message cardiologist is needing to know if colonoscopy is of URGENT matter or if routine. See notes from pre op provider Almyra Deforest, [facility] today. Pt dx 11/22/20 with b/l DVT's and is on anticoagulation therapy, though has not completed a 6 month regimen of blood thinner. Please call back and ask to s/w the pre op team, advise if urgent or not urgent. If not urgent, ok to hold off procedure until pt has completed 6 months of anticoagulation therapy. I will fax these notes to requesting office.

## 2021-02-20 NOTE — Telephone Encounter (Signed)
-----   Message from Donnelly Stager sent at 02/20/2021  9:50 AM EST ----- Regarding: Colonoscopy appt Leslie Hendricks,  Just wanted to let you know that [name] appointment for the colonoscopy is routine so we have rescheduled the appointment for 06/12/2021.

## 2021-02-20 NOTE — Telephone Encounter (Signed)
See staff message below sent to Julaine Hua, CMA, which is off today.  They have postponed pt's procedure as stated below.

## 2021-02-21 NOTE — Telephone Encounter (Signed)
Patient's colonoscopy has been postponed until February 2023. Patient already has an appointment with Dr. Gwenlyn Found scheduled for later this month on 03/14/2021. Therefore, full pre-op risk assessment can be addressed at that time. Will route this clearance form to Dr. Gwenlyn Found so that he is aware and will add "pre-op eval" to the appointment notes. Will remove from pre-op pool.  Darreld Mclean, PA-C 02/21/2021 8:50 AM

## 2021-03-02 ENCOUNTER — Other Ambulatory Visit: Payer: Self-pay | Admitting: Cardiovascular Disease

## 2021-03-05 ENCOUNTER — Other Ambulatory Visit: Payer: Self-pay

## 2021-03-05 ENCOUNTER — Encounter (INDEPENDENT_AMBULATORY_CARE_PROVIDER_SITE_OTHER): Payer: Self-pay | Admitting: Ophthalmology

## 2021-03-05 ENCOUNTER — Ambulatory Visit (INDEPENDENT_AMBULATORY_CARE_PROVIDER_SITE_OTHER): Payer: Medicare Other | Admitting: Ophthalmology

## 2021-03-05 DIAGNOSIS — H353221 Exudative age-related macular degeneration, left eye, with active choroidal neovascularization: Secondary | ICD-10-CM | POA: Diagnosis not present

## 2021-03-05 MED ORDER — AFLIBERCEPT 2MG/0.05ML IZ SOLN FOR KALEIDOSCOPE
2.0000 mg | INTRAVITREAL | Status: AC | PRN
  Administered 2021-03-05: 2 mg via INTRAVITREAL
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
## 2021-03-05 NOTE — Assessment & Plan Note (Addendum)
OS with chronic active subretinal fluid CNVM subfoveal vascularized PED.  Intraretinal fluid in the superior aspect of the FAZ.  Currently at 8-week follow-up post Eylea.    We will repeat injection today follow-up in 6 to 8 weeks again OS  I discussed with the patient that sleep apnea and untreated undiagnosed can sometimes make this condition much worse and much more difficult to treat effectively.  Of asked her if she has review of systems for sleep apnea and she does not fact have nocturia and she talks in her sleep but no witnessed snoring.  I have urged the patient to seek home sleep study via her PCP and if OSA is diagnosed to treat aggressively in order to maximize her oxygenation to the macular region

## 2021-03-14 ENCOUNTER — Other Ambulatory Visit: Payer: Self-pay

## 2021-03-14 ENCOUNTER — Encounter: Payer: Self-pay | Admitting: Cardiovascular Disease

## 2021-03-14 ENCOUNTER — Ambulatory Visit (INDEPENDENT_AMBULATORY_CARE_PROVIDER_SITE_OTHER): Payer: Medicare Other | Admitting: Cardiovascular Disease

## 2021-03-14 VITALS — BP 112/56 | HR 61 | Ht 63.0 in | Wt 217.6 lb

## 2021-03-14 DIAGNOSIS — R0989 Other specified symptoms and signs involving the circulatory and respiratory systems: Secondary | ICD-10-CM

## 2021-03-14 DIAGNOSIS — I25710 Atherosclerosis of autologous vein coronary artery bypass graft(s) with unstable angina pectoris: Secondary | ICD-10-CM | POA: Diagnosis not present

## 2021-03-14 DIAGNOSIS — I82403 Acute embolism and thrombosis of unspecified deep veins of lower extremity, bilateral: Secondary | ICD-10-CM | POA: Diagnosis not present

## 2021-03-14 DIAGNOSIS — I82433 Acute embolism and thrombosis of popliteal vein, bilateral: Secondary | ICD-10-CM

## 2021-03-14 DIAGNOSIS — I10 Essential (primary) hypertension: Secondary | ICD-10-CM | POA: Diagnosis not present

## 2021-03-14 DIAGNOSIS — R600 Localized edema: Secondary | ICD-10-CM | POA: Diagnosis not present

## 2021-03-14 NOTE — Assessment & Plan Note (Signed)
Leslie Hendricks had [place] back in July and was hospitalized for a week.  She developed bilateral popliteal vein DVTs as demonstrated on venous duplex study performed 11/22/2020.  She was placed on Eliquis oral anticoagulation.  Recent Doppler studies performed at [facility] by her PCP 02/15/2021 shows resolution of her DVTs.  I am going to stop her Eliquis oral anticoagulation and we will recheck venous Doppler studies in 3 months.

## 2021-03-14 NOTE — Assessment & Plan Note (Signed)
History of hyperlipidemia on high-dose statin therapy followed by her PCP

## 2021-03-14 NOTE — Patient Instructions (Addendum)
Medication Instructions:   -Stop taking eliquis.   *If you need a refill on your cardiac medications before your next appointment, please call your pharmacy*   Testing/Procedures: Your physician has requested that you have a lower extremity venous duplex. This test is an ultrasound of the veins in the legs. It looks at venous blood flow that carries blood from the heart to the legs. Allow one hour for a Lower Venous exam.  There are no restrictions or special instructions. To be done in Feb/March 2023. This procedure is done at [facility].  Your physician has requested that you have a carotid duplex. This test is an ultrasound of the carotid arteries in your neck. It looks at blood flow through these arteries that supply the brain with blood. Allow one hour for this exam. There are no restrictions or special instructions. This procedure is done at [facility].   Follow-Up: At [facility], you and your health needs are our priority.  As part of our continuing mission to provide you with exceptional heart care, we have created designated Provider Care Teams.  These Care Teams include your primary Cardiologist (physician) and Advanced Practice Providers (APPs -  Physician Assistants and Nurse Practitioners) who all work together to provide you with the care you need, when you need it.  We recommend signing up for the patient portal called "MyChart".  Sign up information is provided on this After Visit Summary.  MyChart is used to connect with patients for Virtual Visits (Telemedicine).  Patients are able to view lab/test results, encounter notes, upcoming appointments, etc.  Non-urgent messages can be sent to your provider as well.   To learn more about what you can do with MyChart, go to [email or web address].    Your next appointment:   6 month(s)  The format for your next appointment:   In Person  Provider:   Jory Sims, DNP, ANP      Then, Quay Burow, MD  will plan to see you again in 12 month(s).{

## 2021-03-14 NOTE — Assessment & Plan Note (Signed)
History of essential hypertension a blood pressure measured today at 112/56.  She is on low-dose amlodipine, losartan and metoprolol.

## 2021-03-14 NOTE — Progress Notes (Signed)
03/14/2021 Leslie Hendricks   12-18-1940  [id number]  Primary Physician Corrington, Delsa Grana, MD Primary Cardiologist: Lorretta Harp MD Garret Reddish, [place], [place]  HPI:  Leslie Hendricks is a 80 y.o.  mildly overweight, widowed Caucasian female with no children who lives alone.  She is accompanied by her Sister Leslie Hendricks today.  Her husband of 34 years died on 04-09-09. She has retired from the [employer]. I last saw her in the office    08/08/2020. Her risk factors include 45 pack-year history of tobacco abuse, having quit 12 years ago; treated hypertension; diabetes; and hyperlipidemia. She does have a strong family history of heart disease. Both parents had bypass surgery. Three of her siblings have myocardial infractions. She had a negative pharmacologic stress test on July 03, 2010. She denies chest pain or shortness of breath. Her major complaints are of lack of stability on her legs, but she really denies claudication. She walks with the aid of a walker. She had arterial Dopplers performed in our office in March of 2012 revealing ABI of 0.98 on the right and 0.77 on the left. She has a very limited functional existence from bed to chair. She rarely gets out of the house. Since I saw her back over one year ago she's had no cardiovascular complaints.she was evaluated by Dr. Servando Snare because of the lung nodule that was not metabolic and therefore the decision was made to follow this conservatively. She apparently needs a knee replacement and was referred back here for cardiovascular clearance.  She had her left total knee replacement 2016.  I saw her over a year ago she now walks with a walker but still is has limited mobility. She gets occasional atypical chest pain and has complained of some palpitations since her rotator cuff surgery in October of last year. I performed a 30 day event monitor which showed only unifocal PVCs.   She had been complaining of increasing dyspnea on exertion.   She was placed on a diuretic because of lower extreme edema by her PCP.  Her last Myoview performed 07/03/2010 was nonischemic and 2D echo performed 2016 showed normal LV function.  We did perform 2D echocardiography September 2020 as well as a Myoview stress test all of which were unremarkable.   Since I saw her 7 months ago she was hospitalized in July for [place].  She had a difficult post hospital course.  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  She does live alone but ambulates with a walker.  She denies chest pain or shortness of breath.     Current Meds  Medication Sig   amLODipine (NORVASC) 2.5 MG tablet Take 1 tablet by mouth once daily   atorvastatin (LIPITOR) 80 MG tablet Take 80 mg by mouth at bedtime.   Cholecalciferol (VITAMIN D-3) 1000 UNITS CAPS Take 1 capsule by mouth daily.   folic acid (FOLVITE) 423 MCG tablet Take 400 mcg by mouth daily.   furosemide (LASIX) 40 MG tablet Take 1 tablet (40 mg total) by mouth 2 (two) times daily.   HYDROcodone-acetaminophen (NORCO) 7.5-325 MG tablet take 1 tablet by oral route  every 8 hours as needed for pain (DNF 09/02/20)   LINZESS 72 MCG capsule Take 72 mcg by mouth every morning.   losartan (COZAAR) 25 MG tablet Take 1 tablet by mouth daily.   Magnesium 250 MG TABS  Take 1 tablet by mouth daily.   metoprolol succinate (TOPROL-XL) 25 MG 24 hr tablet TAKE 1 TABLET BY MOUTH ONCE DAILY. CAN TAKE AN EXTRA 1/2 TABLET FOR PALPITATIONS.   Multiple Vitamins-Minerals (PRESERVISION/LUTEIN) CAPS Take 1 capsule by mouth 2 (two) times daily.   nystatin (MYCOSTATIN/NYSTOP) powder Apply 1 application topically 3 (three) times daily as needed (rash).   Polyethyl Glycol-Propyl Glycol (SYSTANE OP) Place 1 drop into both eyes 2 (two) times daily.    polyethylene glycol (MIRALAX / GLYCOLAX) 17 g packet Take 17 g by mouth daily.   traZODone (DESYREL) 100 MG  tablet Take 100 mg by mouth at bedtime.   venlafaxine (EFFEXOR-XR) 75 MG 24 hr capsule Take 75 mg by mouth daily with breakfast.   vitamin E 400 UNIT capsule Take 400 Units by mouth daily.   zinc gluconate 50 MG tablet Take 50 mg by mouth daily.   [DISCONTINUED] apixaban (ELIQUIS) 5 MG TABS tablet Take 2 tablets (10 mg total) by mouth 2 (two) times daily. Then one tab (5 mg) twice daily starting 11/30/20     Allergies  Allergen Reactions   Banana Nausea And Vomiting   Codeine Nausea And Vomiting and Other (See Comments)   Dilaudid [Hydromorphone] Other (See Comments)    Altered mental status   Other Other (See Comments)   Oxycodone Hcl Nausea And Vomiting   Oxycodone Hcl Other (See Comments)   Sausage [Pickled Meat] Nausea And Vomiting   Tobramycin     Social History   Socioeconomic History   Marital status: Widowed    Spouse name: Not on file   Number of children: Not on file   Years of education: Not on file   Highest education level: Not on file  Occupational History   Not on file  Tobacco Use   Smoking status: Former    Packs/day: 1.00    Years: 30.00    Pack years: 30.00    Types: Cigarettes    Quit date: 04/15/1998    Years since quitting: 22.9   Smokeless tobacco: Never  Vaping Use   Vaping Use: Never used  Substance and Sexual Activity   Alcohol use: No   Drug use: No   Sexual activity: Never    Birth control/protection: Surgical  Other Topics Concern   Not on file  Social History Narrative   Not on file   Social Determinants of Health   Financial Resource Strain: Not on file  Food Insecurity: Not on file  Transportation Needs: Not on file  Physical Activity: Not on file  Stress: Not on file  Social Connections: Not on file  Intimate Partner Violence: Not on file     Review of Systems: General: negative for chills, fever, night sweats or weight changes.  Cardiovascular: negative for chest pain, dyspnea on exertion, edema, orthopnea, palpitations,  paroxysmal nocturnal dyspnea or shortness of breath Dermatological: negative for rash Respiratory: negative for cough or wheezing Urologic: negative for hematuria Abdominal: negative for nausea, vomiting, diarrhea, bright red blood per rectum, melena, or hematemesis Neurologic: negative for visual changes, syncope, or dizziness All other systems reviewed and are otherwise negative except as noted above.    Blood pressure (!) 112/56, pulse 61, height 5\' 3"  (1.6 m), weight 217 lb 9.6 oz (98.7 kg), SpO2 97 %.  General appearance: alert and no distress Neck: no adenopathy, no JVD, supple, symmetrical, trachea midline, thyroid not enlarged, symmetric, no tenderness/mass/nodules, and soft bilateral carotid bruits Lungs: clear to auscultation bilaterally Heart: regular  rate and rhythm, S1, S2 normal, no murmur, click, rub or gallop Extremities: 1-2+ edema bilaterally Pulses: 2+ and symmetric Skin: Skin color, texture, turgor normal. No rashes or lesions Neurologic: Grossly normal  EKG sinus rhythm at 61 without ST or T wave changes.  I personally reviewed this EKG.  ASSESSMENT AND PLAN:   HYPERLIPIDEMIA History of hyperlipidemia on high-dose statin therapy followed by her PCP  Benign essential hypertension History of essential hypertension a blood pressure measured today at 112/56.  She is on low-dose amlodipine, losartan and metoprolol.  Bilateral lower extremity edema History of bilateral lower extremity edema thought to be related to diastolic dysfunction on furosemide with marked improvement.  Leg DVT (deep venous thromboembolism), acute, bilateral ([place]) Ms. Rost had COVID back in July and was hospitalized for a week.  She developed bilateral popliteal vein DVTs as demonstrated on venous duplex study performed 11/22/2020.  She was placed on Eliquis oral anticoagulation.  Recent Doppler studies performed at [facility] by her PCP 02/15/2021 shows resolution of her DVTs.  I am going to  stop her Eliquis oral anticoagulation and we will recheck venous Doppler studies in 3 months.     Lorretta Harp MD FACP,FACC,FAHA, [facility] 03/14/2021 3:11 PM

## 2021-03-14 NOTE — Assessment & Plan Note (Signed)
History of bilateral lower extremity edema thought to be related to diastolic dysfunction on furosemide with marked improvement.

## 2021-03-28 ENCOUNTER — Ambulatory Visit (INDEPENDENT_AMBULATORY_CARE_PROVIDER_SITE_OTHER): Payer: Medicare Other | Admitting: Pulmonary Disease

## 2021-03-28 ENCOUNTER — Encounter: Payer: Self-pay | Admitting: Pulmonary Disease

## 2021-03-28 ENCOUNTER — Other Ambulatory Visit: Payer: Self-pay

## 2021-03-28 VITALS — BP 140/56 | HR 64 | Temp 97.7°F | Ht 63.0 in | Wt 217.6 lb

## 2021-03-28 DIAGNOSIS — I25710 Atherosclerosis of autologous vein coronary artery bypass graft(s) with unstable angina pectoris: Secondary | ICD-10-CM | POA: Diagnosis not present

## 2021-03-28 DIAGNOSIS — G4733 Obstructive sleep apnea (adult) (pediatric): Secondary | ICD-10-CM

## 2021-03-28 NOTE — Patient Instructions (Signed)
X Home sleep test 
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
## 2021-04-12 ENCOUNTER — Ambulatory Visit (INDEPENDENT_AMBULATORY_CARE_PROVIDER_SITE_OTHER): Payer: Medicare Other | Admitting: Podiatry

## 2021-04-12 ENCOUNTER — Other Ambulatory Visit: Payer: Self-pay

## 2021-04-12 ENCOUNTER — Encounter: Payer: Self-pay | Admitting: Podiatry

## 2021-04-12 DIAGNOSIS — M79676 Pain in unspecified toe(s): Secondary | ICD-10-CM

## 2021-04-12 DIAGNOSIS — B351 Tinea unguium: Secondary | ICD-10-CM | POA: Diagnosis not present

## 2021-04-12 DIAGNOSIS — D2371 Other benign neoplasm of skin of right lower limb, including hip: Secondary | ICD-10-CM | POA: Diagnosis not present

## 2021-04-12 DIAGNOSIS — D2372 Other benign neoplasm of skin of left lower limb, including hip: Secondary | ICD-10-CM | POA: Diagnosis not present

## 2021-04-12 NOTE — Progress Notes (Signed)
She presents today chief complaint of painful elongated toenails and calluses plantar aspect of bilateral foot.  Denies any other problems.  Objective: Vital signs stable alert and oriented x3 pulses are palpable bilateral.  Hammertoe deformities noted bilateral multiple benign skin lesions plantar aspect of the bilateral forefoot beneath the fifth metatarsal tarsal and the third metatarsal on the left foot primarily she also has interdigital reactive hyperkeratotic lesions.  Otherwise toenails are long thick yellow dystrophic clinical mycotic painful palpation as well as debridement.  Assessment: Pain in limb secondary to benign skin lesions and pain in limb secondary onychomycosis.  Plan: Debridement of toenails 1 through 5 bilateral debridement of benign skin lesions bilateral.

## 2021-04-20 ENCOUNTER — Other Ambulatory Visit: Payer: Self-pay | Admitting: Cardiovascular Disease

## 2021-04-23 ENCOUNTER — Encounter (INDEPENDENT_AMBULATORY_CARE_PROVIDER_SITE_OTHER): Payer: Medicare Other | Admitting: Ophthalmology

## 2021-04-24 ENCOUNTER — Encounter (INDEPENDENT_AMBULATORY_CARE_PROVIDER_SITE_OTHER): Payer: Self-pay | Admitting: Ophthalmology

## 2021-04-24 ENCOUNTER — Other Ambulatory Visit: Payer: Self-pay

## 2021-04-24 ENCOUNTER — Ambulatory Visit (INDEPENDENT_AMBULATORY_CARE_PROVIDER_SITE_OTHER): Payer: Medicare Other | Admitting: Ophthalmology

## 2021-04-24 DIAGNOSIS — H353221 Exudative age-related macular degeneration, left eye, with active choroidal neovascularization: Secondary | ICD-10-CM

## 2021-04-24 MED ORDER — AFLIBERCEPT 2MG/0.05ML IZ SOLN FOR KALEIDOSCOPE
2.0000 mg | INTRAVITREAL | Status: AC | PRN
  Administered 2021-04-24: 2 mg via INTRAVITREAL

## 2021-04-24 NOTE — Assessment & Plan Note (Signed)
Active persistent subretinal fluid OS, with preserved acuity still improved acuity as compared to visit June 2022 when acuity was 20/100.  Repeat injection today to maintain chronic active CNVM present OS.
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
## 2021-05-16 ENCOUNTER — Other Ambulatory Visit: Payer: Self-pay

## 2021-05-16 ENCOUNTER — Ambulatory Visit: Payer: Medicare Other

## 2021-05-16 DIAGNOSIS — R0683 Snoring: Secondary | ICD-10-CM | POA: Diagnosis not present

## 2021-05-16 DIAGNOSIS — G4733 Obstructive sleep apnea (adult) (pediatric): Secondary | ICD-10-CM

## 2021-05-19 ENCOUNTER — Other Ambulatory Visit: Payer: Self-pay | Admitting: Cardiovascular Disease

## 2021-05-21 ENCOUNTER — Telehealth: Payer: Self-pay | Admitting: Pulmonary Disease

## 2021-05-21 DIAGNOSIS — G4733 Obstructive sleep apnea (adult) (pediatric): Secondary | ICD-10-CM

## 2021-05-21 DIAGNOSIS — R0683 Snoring: Secondary | ICD-10-CM | POA: Diagnosis not present

## 2021-05-21 NOTE — Telephone Encounter (Signed)
Called and went over results with patient. She voiced understanding of plan and was agreeable to do CPAP titration study. Order placed and nothing further needed for now.

## 2021-05-21 NOTE — Telephone Encounter (Signed)
HST showed moderate  OSA with AHI 18 / hr Also oxygen desaturation was severe with lowest desaturation of 72% Suggest CPAP therapy since she has heart issues and eye issues  Proceed with CPAP titration study if she is willing and based on this we will write prescription for CPAP machine

## 2021-05-22 NOTE — Telephone Encounter (Signed)
Called pt to give her appt info for cpap titration study and she has questions about what was found on the sleep study.  Will route back to [place] so she can speak to pt again.

## 2021-05-22 NOTE — Telephone Encounter (Signed)
Called and spoke to patient regarding CPAP titration study. She wanted to know the details again of her HST. She voiced understanding after reviewing results again and is okay to proceed with CPAP titration.

## 2021-06-01 ENCOUNTER — Other Ambulatory Visit: Payer: Self-pay

## 2021-06-01 ENCOUNTER — Ambulatory Visit (INDEPENDENT_AMBULATORY_CARE_PROVIDER_SITE_OTHER): Payer: Medicare Other

## 2021-06-01 ENCOUNTER — Ambulatory Visit (INDEPENDENT_AMBULATORY_CARE_PROVIDER_SITE_OTHER): Payer: Medicare Other | Admitting: Orthopedic Surgery

## 2021-06-01 DIAGNOSIS — M79602 Pain in left arm: Secondary | ICD-10-CM | POA: Diagnosis not present

## 2021-06-02 ENCOUNTER — Encounter: Payer: Self-pay | Admitting: Orthopedic Surgery
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
## 2021-06-05 ENCOUNTER — Telehealth: Payer: Self-pay | Admitting: Orthopedic Surgery

## 2021-06-05 NOTE — Telephone Encounter (Signed)
lmom for pt to return call to sch MR Arthrogram left shoulder review with Dr. Marlou Sa after 06/18/21

## 2021-06-08 ENCOUNTER — Other Ambulatory Visit: Payer: Self-pay | Admitting: Cardiovascular Disease

## 2021-06-12 ENCOUNTER — Encounter (INDEPENDENT_AMBULATORY_CARE_PROVIDER_SITE_OTHER): Payer: Medicare Other | Admitting: Ophthalmology

## 2021-06-13 ENCOUNTER — Encounter (INDEPENDENT_AMBULATORY_CARE_PROVIDER_SITE_OTHER): Payer: Medicare Other | Admitting: Ophthalmology

## 2021-06-14 ENCOUNTER — Ambulatory Visit (HOSPITAL_COMMUNITY)
Admission: RE | Admit: 2021-06-14 | Discharge: 2021-06-14 | Disposition: A | Discharge status: Home / Self Care | Payer: Medicare Other | Source: Ambulatory Visit | Attending: Cardiology | Admitting: Cardiology

## 2021-06-14 ENCOUNTER — Other Ambulatory Visit: Payer: Self-pay

## 2021-06-14 DIAGNOSIS — R0989 Other specified symptoms and signs involving the circulatory and respiratory systems: Secondary | ICD-10-CM

## 2021-06-14 DIAGNOSIS — R600 Localized edema: Secondary | ICD-10-CM

## 2021-06-14 DIAGNOSIS — I82433 Acute embolism and thrombosis of popliteal vein, bilateral: Secondary | ICD-10-CM

## 2021-06-18 ENCOUNTER — Ambulatory Visit
Admission: RE | Admit: 2021-06-18 | Discharge: 2021-06-18 | Disposition: A | Discharge status: Home / Self Care | Payer: Medicare Other | Source: Ambulatory Visit | Attending: Orthopedic Surgery | Admitting: Orthopedic Surgery

## 2021-06-18 DIAGNOSIS — M79602 Pain in left arm: Secondary | ICD-10-CM

## 2021-06-18 MED ORDER — IOPAMIDOL (ISOVUE-M 200) INJECTION 41%
13.0000 mL | Freq: Once | INTRAMUSCULAR | Status: AC
  Administered 2021-06-18: 13 mL via INTRA_ARTICULAR

## 2021-06-19 ENCOUNTER — Encounter (INDEPENDENT_AMBULATORY_CARE_PROVIDER_SITE_OTHER): Payer: Self-pay | Admitting: Ophthalmology

## 2021-06-19 ENCOUNTER — Other Ambulatory Visit: Payer: Self-pay

## 2021-06-19 ENCOUNTER — Ambulatory Visit (INDEPENDENT_AMBULATORY_CARE_PROVIDER_SITE_OTHER): Payer: Medicare Other | Admitting: Ophthalmology

## 2021-06-19 DIAGNOSIS — H3322 Serous retinal detachment, left eye: Secondary | ICD-10-CM

## 2021-06-19 DIAGNOSIS — H353221 Exudative age-related macular degeneration, left eye, with active choroidal neovascularization: Secondary | ICD-10-CM | POA: Diagnosis not present

## 2021-06-19 MED ORDER — AFLIBERCEPT 2MG/0.05ML IZ SOLN FOR KALEIDOSCOPE
2.0000 mg | INTRAVITREAL | Status: AC | PRN
  Administered 2021-06-19: 2 mg via INTRAVITREAL

## 2021-06-19 NOTE — Assessment & Plan Note (Signed)
The nature of wet macular degeneration was discussed with the patient.  Forms of therapy reviewed include the use of Anti-VEGF medications injected painlessly into the eye, as well as other possible treatment modalities, including thermal laser therapy. Fellow eye involvement and risks were discussed with the patient. Upon the finding of wet age related macular degeneration, treatment will be offered. The treatment regimen is on a treat as needed basis with the intent to treat if necessary and extend interval of exams when possible. On average 1 out of 6 patients do not need lifetime therapy. However, the risk of recurrent disease is high for a lifetime.  Initially monthly, then periodic, examinations and evaluations will determine whether the next treatment is required on the day of the examination. ? ? ?OS ?Chronic active serous subretinal fluid. ?
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
## 2021-06-19 NOTE — Assessment & Plan Note (Signed)
Component of wet AMD OS 

## 2021-06-20 ENCOUNTER — Ambulatory Visit (INDEPENDENT_AMBULATORY_CARE_PROVIDER_SITE_OTHER): Payer: Medicare Other | Admitting: Surgical

## 2021-06-20 DIAGNOSIS — M75122 Complete rotator cuff tear or rupture of left shoulder, not specified as traumatic: Secondary | ICD-10-CM

## 2021-06-21 ENCOUNTER — Other Ambulatory Visit: Payer: Self-pay

## 2021-06-21 ENCOUNTER — Ambulatory Visit (HOSPITAL_BASED_OUTPATIENT_CLINIC_OR_DEPARTMENT_OTHER): Payer: Medicare Other | Attending: Pulmonary Disease | Admitting: Pulmonary Disease

## 2021-06-21 DIAGNOSIS — G4733 Obstructive sleep apnea (adult) (pediatric): Secondary | ICD-10-CM | POA: Insufficient documentation

## 2021-06-21 DIAGNOSIS — Z79899 Other long term (current) drug therapy: Secondary | ICD-10-CM | POA: Insufficient documentation

## 2021-06-22 ENCOUNTER — Telehealth: Payer: Self-pay | Admitting: Pulmonary Disease

## 2021-06-22 ENCOUNTER — Encounter: Payer: Self-pay | Admitting: Orthopedic Surgery

## 2021-06-22 ENCOUNTER — Encounter (HOSPITAL_BASED_OUTPATIENT_CLINIC_OR_DEPARTMENT_OTHER): Payer: Medicare Other | Admitting: Pulmonary Disease

## 2021-06-22 DIAGNOSIS — G4733 Obstructive sleep apnea (adult) (pediatric): Secondary | ICD-10-CM

## 2021-06-22 MED ORDER — LIDOCAINE HCL 1 % IJ SOLN
5.0000 mL | INTRAMUSCULAR | Status: AC | PRN
  Administered 2021-06-20: 5 mL

## 2021-06-22 MED ORDER — METHYLPREDNISOLONE ACETATE 40 MG/ML IJ SUSP
40.0000 mg | INTRAMUSCULAR | Status: AC | PRN
  Administered 2021-06-20: 40 mg via INTRA_ARTICULAR

## 2021-06-22 MED ORDER — BUPIVACAINE HCL 0.5 % IJ SOLN
9.0000 mL | INTRAMUSCULAR | Status: AC | PRN
  Administered 2021-06-20: 9 mL via INTRA_ARTICULAR

## 2021-06-22 NOTE — Telephone Encounter (Signed)
Called and notified patient of results and that we would be sending order for CPAP. She voiced understanding and had no questions. Nothing further needed.  ?
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
## 2021-06-22 NOTE — Procedures (Signed)
Patient Name: Leslie Hendricks, Leslie Hendricks ?Study Date: 06/21/2021 ?Gender: Female ?D.O.B: Aug 18, 1940 ?Age (years): 30 ?Referring Provider: Kara Mead MD, ABSM ?Height (inches): 63 ?Interpreting Physician: Kara Mead MD, ABSM ?Weight (lbs): 217 ?RPSGT: Zadie Rhine ?BMI: 38 ?MRN: [id number] ?Neck Size: 15.00 ?<br> <br> ?CLINICAL INFORMATION ?The patient is referred for a CPAP titration to treat sleep apnea. ? ? ? ?Date of HST: 05/17/21  showed moderate  OSA with AHI 18 / hr ?Oxygen desaturation was severe with lowest desaturation of 72% ? ?SLEEP STUDY TECHNIQUE ?As per the AASM Manual for the Scoring of Sleep and Associated Events v2.3 (April 2016) with a hypopnea requiring 4% desaturations. ? ?The channels recorded and monitored were frontal, central and occipital EEG, electrooculogram (EOG), submentalis EMG (chin), nasal and oral airflow, thoracic and abdominal wall motion, anterior tibialis EMG, snore microphone, electrocardiogram, and pulse oximetry. Continuous positive airway pressure (CPAP) was initiated at the beginning of the study and titrated to treat sleep-disordered breathing. ? ?MEDICATIONS ?Medications self-administered by patient taken the night of the study : TRAZODONE, METOPROLOL, ATORVASTATIN, Hurocod/acetam ? ?RESPIRATORY PARAMETERS ?Optimal PAP Pressure (cm): 15 AHI at Optimal Pressure (/hr): 0.5 ?Overall Minimal O2 (%): 82.0 Supine % at Optimal Pressure (%): 100 ?Minimal O2 at Optimal Pressure (%): 88.0  ? ?SLEEP ARCHITECTURE ?The study was initiated at 10:46:22 PM and ended at 5:18:31 AM. ? ?Sleep onset time was 13.5 minutes and the sleep efficiency was 81.5%%. The total sleep time was 319.5 minutes. ? ?The patient spent 1.1%% of the night in stage N1 sleep, 58.5%% in stage N2 sleep, 15.2%% in stage N3 and 25.2% in REM.Stage REM latency was 298.0 minutes ? ?Wake after sleep onset was 59.1. Alpha intrusion was absent. Supine sleep was 100.00%. ? ?CARDIAC DATA ?The 2 lead EKG demonstrated sinus rhythm. The  mean heart rate was 52.1 beats per minute. Other EKG findings include: PVCs. ? ?LEG MOVEMENT DATA ?The total Periodic Limb Movements of Sleep (PLMS) were 0. The PLMS index was 0.0. A PLMS index of <15 is considered normal in adults. ? ?IMPRESSIONS ?- The optimal PAP pressure was 15 cm of water. ?- Central sleep apnea was not noted during this titration (CAI = 1.7/h). ?- Moderate oxygen desaturations were observed during this titration (min O2 = 82.0%). ?- No snoring was audible during this study. ?- 2-lead EKG demonstrated: PVCs ?- Many periodic limb movements were noted during this study. Arousals associated with PLMs were rare. ? ? ?DIAGNOSIS ?- Obstructive Sleep Apnea (G47.33) ? ? ?RECOMMENDATIONS ?- Trial of CPAP therapy on 15 cm H2O with a Small size Fisher[employer]kel Full Face Simplus mask and heated humidification. ?- Avoid alcohol, sedatives and other CNS depressants that may worsen sleep apnea and disrupt normal sleep architecture. ?- Sleep hygiene should be reviewed to assess factors that may improve sleep quality. ?- Weight management and regular exercise should be initiated or continued. ?- Return to [facility] Center for re-evaluation after 4 weeks of therapy ? ? ? ?Kara Mead MD ?Board Certified in Sleep medicine ? ?

## 2021-06-22 NOTE — Telephone Encounter (Signed)
CPAP titration study showed good control of events on CPAP ? ? ?Please send Rx for auto CPAP 10- 15 cm H2O with a Small size Fisher[employer] Full Face Simplus mask ? ?OV 6 wks after starting ?

## 2021-07-12 ENCOUNTER — Encounter: Payer: Self-pay | Admitting: Podiatry

## 2021-07-12 ENCOUNTER — Ambulatory Visit (INDEPENDENT_AMBULATORY_CARE_PROVIDER_SITE_OTHER): Payer: Medicare Other | Admitting: Podiatry

## 2021-07-12 DIAGNOSIS — D2371 Other benign neoplasm of skin of right lower limb, including hip: Secondary | ICD-10-CM

## 2021-07-12 DIAGNOSIS — M79676 Pain in unspecified toe(s): Secondary | ICD-10-CM | POA: Diagnosis not present

## 2021-07-12 DIAGNOSIS — B351 Tinea unguium: Secondary | ICD-10-CM | POA: Diagnosis not present

## 2021-07-12 DIAGNOSIS — D2372 Other benign neoplasm of skin of left lower limb, including hip: Secondary | ICD-10-CM

## 2021-07-12 NOTE — Progress Notes (Signed)
She presents today chief complaint of painful elongated toenails and a painful callus to the underside of her big toe left foot. ? ?Objective: Vital signs are stable she is alert and oriented x3 pulses are palpable.  Neurologic sensorium is intact.  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  This is resulted in the reactive hyperkeratotic lesion preulcerative in nature side IP joint of the left hallux.  Otherwise her toenails are long thick yellow dystrophic-like mycotic no other hyperkeratotic lesions noted. ? ?Assessment: Pain in limb secondary to onychomycosis benign skin lesion and hallux valgus with osteoarthritis. ? ?Plan: Debridement of toenails 1 through 5 bilateral debridement of benign skin lesion.  Follow-up with me as needed. ?

## 2021-07-18 ENCOUNTER — Encounter: Payer: Self-pay | Admitting: Orthopedic Surgery

## 2021-07-18 ENCOUNTER — Ambulatory Visit (INDEPENDENT_AMBULATORY_CARE_PROVIDER_SITE_OTHER): Payer: Medicare Other | Admitting: Orthopedic Surgery

## 2021-07-18 DIAGNOSIS — M75122 Complete rotator cuff tear or rupture of left shoulder, not specified as traumatic: Secondary | ICD-10-CM | POA: Diagnosis not present
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
## 2021-08-06 ENCOUNTER — Other Ambulatory Visit: Payer: Self-pay | Admitting: Cardiovascular Disease

## 2021-08-14 ENCOUNTER — Encounter (INDEPENDENT_AMBULATORY_CARE_PROVIDER_SITE_OTHER): Payer: Medicare Other | Admitting: Ophthalmology

## 2021-08-16 ENCOUNTER — Ambulatory Visit (INDEPENDENT_AMBULATORY_CARE_PROVIDER_SITE_OTHER): Payer: Medicare Other | Admitting: Ophthalmology

## 2021-08-16 ENCOUNTER — Encounter (INDEPENDENT_AMBULATORY_CARE_PROVIDER_SITE_OTHER): Payer: Self-pay | Admitting: Ophthalmology

## 2021-08-16 DIAGNOSIS — H353212 Exudative age-related macular degeneration, right eye, with inactive choroidal neovascularization: Secondary | ICD-10-CM | POA: Diagnosis not present

## 2021-08-16 DIAGNOSIS — H353221 Exudative age-related macular degeneration, left eye, with active choroidal neovascularization: Secondary | ICD-10-CM

## 2021-08-16 MED ORDER — AFLIBERCEPT 2MG/0.05ML IZ SOLN FOR KALEIDOSCOPE
2.0000 mg | INTRAVITREAL | Status: AC | PRN
  Administered 2021-08-16: 2 mg via INTRAVITREAL

## 2021-08-16 NOTE — Assessment & Plan Note (Signed)
Chronic active disease with subretinal fluid OS.  On Eylea.  Today at 2-week follow-up interval.  We will repeat injection today follow-up in 5 weeks and likely change to Vabysmo ?

## 2021-08-16 NOTE — Assessment & Plan Note (Signed)
History of central disciform scar OD stable ?
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
## 2021-08-17 ENCOUNTER — Encounter: Payer: Self-pay | Admitting: Pulmonary Disease

## 2021-08-23 ENCOUNTER — Other Ambulatory Visit: Payer: Self-pay | Admitting: Cardiovascular Disease
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
## 2021-08-31 ENCOUNTER — Encounter: Payer: Self-pay | Admitting: Adult Health

## 2021-08-31 ENCOUNTER — Ambulatory Visit (INDEPENDENT_AMBULATORY_CARE_PROVIDER_SITE_OTHER): Payer: Medicare Other | Admitting: Adult Health

## 2021-08-31 VITALS — BP 138/50 | HR 60 | Ht 63.0 in | Wt 215.0 lb

## 2021-08-31 DIAGNOSIS — I10 Essential (primary) hypertension: Secondary | ICD-10-CM | POA: Diagnosis not present

## 2021-08-31 DIAGNOSIS — E7800 Pure hypercholesterolemia, unspecified: Secondary | ICD-10-CM

## 2021-08-31 DIAGNOSIS — H35362 Drusen (degenerative) of macula, left eye: Secondary | ICD-10-CM

## 2021-08-31 DIAGNOSIS — I82403 Acute embolism and thrombosis of unspecified deep veins of lower extremity, bilateral: Secondary | ICD-10-CM

## 2021-08-31 DIAGNOSIS — R0989 Other specified symptoms and signs involving the circulatory and respiratory systems: Secondary | ICD-10-CM | POA: Diagnosis not present

## 2021-08-31 DIAGNOSIS — I739 Peripheral vascular disease, unspecified: Secondary | ICD-10-CM

## 2021-08-31 NOTE — Patient Instructions (Signed)
Medication Instructions:  No Changes *If you need a refill on your cardiac medications before your next appointment, please call your pharmacy*   Lab Work: No Labs If you have labs (blood work) drawn today and your tests are completely normal, you will receive your results only by: MyChart Message (if you have MyChart) OR A paper copy in the mail If you have any lab test that is abnormal or we need to change your treatment, we will call you to review the results.   Testing/Procedures: No Testing   Follow-Up: At [facility], you and your health needs are our priority.  As part of our continuing mission to provide you with exceptional heart care, we have created designated Provider Care Teams.  These Care Teams include your primary Cardiologist (physician) and Advanced Practice Providers (APPs -  Physician Assistants and Nurse Practitioners) who all work together to provide you with the care you need, when you need it.  We recommend signing up for the patient portal called "MyChart".  Sign up information is provided on this After Visit Summary.  MyChart is used to connect with patients for Virtual Visits (Telemedicine).  Patients are able to view lab/test results, encounter notes, upcoming appointments, etc.  Non-urgent messages can be sent to your provider as well.   To learn more about what you can do with MyChart, go to [email or web address].    Your next appointment:   1 year(s)  The format for your next appointment:   In Person  Provider:   Jonathan Berry, MD      Important Information About Sugar       

## 2021-09-11 ENCOUNTER — Ambulatory Visit: Payer: Medicare Other | Admitting: Cardiovascular Disease

## 2021-09-13 ENCOUNTER — Telehealth: Payer: Self-pay | Admitting: Pulmonary Disease

## 2021-09-14 NOTE — Telephone Encounter (Signed)
I resent the referral to [employer] and marked it high importance. Nothing further needed at this time.

## 2021-09-24 ENCOUNTER — Ambulatory Visit (INDEPENDENT_AMBULATORY_CARE_PROVIDER_SITE_OTHER): Payer: Medicare Other | Admitting: Ophthalmology

## 2021-09-24 ENCOUNTER — Encounter (INDEPENDENT_AMBULATORY_CARE_PROVIDER_SITE_OTHER): Payer: Self-pay | Admitting: Ophthalmology

## 2021-09-24 DIAGNOSIS — H353221 Exudative age-related macular degeneration, left eye, with active choroidal neovascularization: Secondary | ICD-10-CM

## 2021-09-24 MED ORDER — FARICIMAB-SVOA 6 MG/0.05ML IZ SOLN
6.0000 mg | INTRAVITREAL | Status: AC | PRN
  Administered 2021-09-24: 6 mg via INTRAVITREAL
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
## 2021-09-24 NOTE — Assessment & Plan Note (Signed)
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  Has been on Avastin, Lucentis now Eylea in the past with no resolution.  Switch to Vabysmo today injection #1

## 2021-09-27 ENCOUNTER — Other Ambulatory Visit: Payer: Self-pay | Admitting: Cardiovascular Disease

## 2021-10-08 ENCOUNTER — Encounter: Payer: Self-pay | Admitting: Orthopedic Surgery

## 2021-10-08 ENCOUNTER — Ambulatory Visit (INDEPENDENT_AMBULATORY_CARE_PROVIDER_SITE_OTHER): Payer: Medicare Other | Admitting: Surgical

## 2021-10-08 ENCOUNTER — Ambulatory Visit (INDEPENDENT_AMBULATORY_CARE_PROVIDER_SITE_OTHER): Payer: Medicare Other

## 2021-10-08 ENCOUNTER — Other Ambulatory Visit: Payer: Self-pay | Admitting: Cardiovascular Disease

## 2021-10-08 DIAGNOSIS — M25511 Pain in right shoulder: Secondary | ICD-10-CM | POA: Diagnosis not present

## 2021-10-08 DIAGNOSIS — M19011 Primary osteoarthritis, right shoulder: Secondary | ICD-10-CM

## 2021-10-08 DIAGNOSIS — G8929 Other chronic pain: Secondary | ICD-10-CM

## 2021-10-08 DIAGNOSIS — M75122 Complete rotator cuff tear or rupture of left shoulder, not specified as traumatic: Secondary | ICD-10-CM | POA: Diagnosis not present

## 2021-10-08 MED ORDER — LIDOCAINE HCL 1 % IJ SOLN
5.0000 mL | INTRAMUSCULAR | Status: AC | PRN
  Administered 2021-10-08: 5 mL

## 2021-10-08 MED ORDER — METHYLPREDNISOLONE ACETATE 40 MG/ML IJ SUSP
40.0000 mg | INTRAMUSCULAR | Status: AC | PRN
  Administered 2021-10-08: 40 mg via INTRA_ARTICULAR

## 2021-10-08 MED ORDER — BUPIVACAINE HCL 0.5 % IJ SOLN
9.0000 mL | INTRAMUSCULAR | Status: AC | PRN
  Administered 2021-10-08: 9 mL via INTRA_ARTICULAR
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
## 2021-10-29 ENCOUNTER — Ambulatory Visit (INDEPENDENT_AMBULATORY_CARE_PROVIDER_SITE_OTHER): Payer: Medicare Other | Admitting: Ophthalmology

## 2021-10-29 ENCOUNTER — Encounter (INDEPENDENT_AMBULATORY_CARE_PROVIDER_SITE_OTHER): Payer: Self-pay | Admitting: Ophthalmology

## 2021-10-29 DIAGNOSIS — H353123 Nonexudative age-related macular degeneration, left eye, advanced atrophic without subfoveal involvement: Secondary | ICD-10-CM | POA: Diagnosis not present

## 2021-10-29 DIAGNOSIS — H353212 Exudative age-related macular degeneration, right eye, with inactive choroidal neovascularization: Secondary | ICD-10-CM | POA: Diagnosis not present

## 2021-10-29 DIAGNOSIS — H353221 Exudative age-related macular degeneration, left eye, with active choroidal neovascularization: Secondary | ICD-10-CM | POA: Diagnosis not present

## 2021-10-29 DIAGNOSIS — H3322 Serous retinal detachment, left eye: Secondary | ICD-10-CM | POA: Diagnosis not present

## 2021-10-29 MED ORDER — FARICIMAB-SVOA 6 MG/0.05ML IZ SOLN
6.0000 mg | INTRAVITREAL | Status: AC | PRN
  Administered 2021-10-29: 6 mg via INTRAVITREAL

## 2021-10-29 NOTE — Assessment & Plan Note (Signed)
Chronic subretinal fluid post Vabysmo No. 1, repeat injection OS today

## 2021-10-29 NOTE — Assessment & Plan Note (Signed)
White disciform scar limits acuity

## 2021-10-29 NOTE — Assessment & Plan Note (Signed)
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

## 2021-10-29 NOTE — Assessment & Plan Note (Signed)
Geographic atrophy not in the [place]
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
## 2021-10-30 ENCOUNTER — Ambulatory Visit (INDEPENDENT_AMBULATORY_CARE_PROVIDER_SITE_OTHER): Payer: Medicare Other | Admitting: Podiatry

## 2021-10-30 DIAGNOSIS — D2371 Other benign neoplasm of skin of right lower limb, including hip: Secondary | ICD-10-CM | POA: Diagnosis not present

## 2021-10-30 DIAGNOSIS — M79676 Pain in unspecified toe(s): Secondary | ICD-10-CM

## 2021-10-30 DIAGNOSIS — D2372 Other benign neoplasm of skin of left lower limb, including hip: Secondary | ICD-10-CM

## 2021-10-30 DIAGNOSIS — B351 Tinea unguium: Secondary | ICD-10-CM

## 2021-10-30 NOTE — Progress Notes (Signed)
She presents today chief complaint of painful elongated toenails and calluses bilateral.  Objective: Pulses are palpable toenails are long thick yellow dystrophic Lee mycotic palpable painful reactive hyperkeratotic lesion medial aspect of the left great toe at the interphalangeal joint.  Assessment pain in limb secondary to onychomycosis and painful callus.  Plan: Debridement of benign skin lesion.  Debridement of toenails 1 through 5 bilateral.

## 2021-11-01 DIAGNOSIS — F331 Major depressive disorder, recurrent, moderate: Secondary | ICD-10-CM | POA: Insufficient documentation

## 2021-11-08 ENCOUNTER — Telehealth: Payer: Self-pay | Admitting: Pulmonary Disease

## 2021-11-08 NOTE — Telephone Encounter (Signed)
Received CPAP setup confirmation from [employer]  Setup date- 11/05/21 settings 10-15 cm  Pt needs appt between 12/06/21-02/03/22  Called the pt to get appt set up  There was no answer and no option to leave msg  Will try back later.

## 2021-11-29 ENCOUNTER — Ambulatory Visit (HOSPITAL_COMMUNITY)
Admission: RE | Admit: 2021-11-29 | Discharge: 2021-11-29 | Disposition: A | Discharge status: Home / Self Care | Payer: Medicare Other | Source: Ambulatory Visit | Attending: Orthopedic Surgery | Admitting: Orthopedic Surgery

## 2021-11-29 ENCOUNTER — Encounter (HOSPITAL_COMMUNITY): Payer: Medicare Other

## 2021-11-29 ENCOUNTER — Encounter: Payer: Self-pay | Admitting: Orthopedic Surgery

## 2021-11-29 ENCOUNTER — Ambulatory Visit (INDEPENDENT_AMBULATORY_CARE_PROVIDER_SITE_OTHER): Payer: Medicare Other | Admitting: Ophthalmology

## 2021-11-29 ENCOUNTER — Ambulatory Visit (INDEPENDENT_AMBULATORY_CARE_PROVIDER_SITE_OTHER): Payer: Medicare Other | Admitting: Orthopedic Surgery

## 2021-11-29 ENCOUNTER — Ambulatory Visit (INDEPENDENT_AMBULATORY_CARE_PROVIDER_SITE_OTHER): Payer: Medicare Other

## 2021-11-29 ENCOUNTER — Encounter (INDEPENDENT_AMBULATORY_CARE_PROVIDER_SITE_OTHER): Payer: Self-pay | Admitting: Ophthalmology

## 2021-11-29 DIAGNOSIS — H353221 Exudative age-related macular degeneration, left eye, with active choroidal neovascularization: Secondary | ICD-10-CM

## 2021-11-29 DIAGNOSIS — M12811 Other specific arthropathies, not elsewhere classified, right shoulder: Secondary | ICD-10-CM | POA: Diagnosis not present

## 2021-11-29 DIAGNOSIS — M79604 Pain in right leg: Secondary | ICD-10-CM | POA: Insufficient documentation

## 2021-11-29 DIAGNOSIS — H353114 Nonexudative age-related macular degeneration, right eye, advanced atrophic with subfoveal involvement: Secondary | ICD-10-CM

## 2021-11-29 DIAGNOSIS — M25561 Pain in right knee: Secondary | ICD-10-CM | POA: Diagnosis not present

## 2021-11-29 DIAGNOSIS — D3132 Benign neoplasm of left choroid: Secondary | ICD-10-CM | POA: Diagnosis not present

## 2021-11-29 MED ORDER — FARICIMAB-SVOA 6 MG/0.05ML IZ SOLN
6.0000 mg | INTRAVITREAL | Status: AC | PRN
  Administered 2021-11-29: 6 mg via INTRAVITREAL

## 2021-11-29 NOTE — Assessment & Plan Note (Signed)
Chronic active disease now improved post injection Vabysmo No. 2.  Much less subretinal fluid today.  Repeat injection today at 1 month of Vabysmo

## 2021-11-29 NOTE — Assessment & Plan Note (Signed)
Stable no change over time

## 2021-11-29 NOTE — Progress Notes (Signed)
LLE venous duplex has been completed.  Preliminary results given to Dr. Tomi Bamberger.   Results can be found under chart review under CV PROC. 11/29/2021 1:58 PM Exavior Kimmons RVT, RDMS
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
## 2021-11-29 NOTE — Assessment & Plan Note (Signed)
And old disciform scar counselor acuity OD

## 2021-11-30 ENCOUNTER — Ambulatory Visit: Payer: Medicare Other | Admitting: Surgical

## 2021-11-30 MED ORDER — LIDOCAINE HCL 1 % IJ SOLN
5.0000 mL | INTRAMUSCULAR | Status: AC | PRN
  Administered 2021-11-29: 5 mL

## 2021-11-30 MED ORDER — METHYLPREDNISOLONE ACETATE 40 MG/ML IJ SUSP
40.0000 mg | INTRAMUSCULAR | Status: AC | PRN
  Administered 2021-11-29: 40 mg via INTRA_ARTICULAR

## 2021-11-30 MED ORDER — BUPIVACAINE HCL 0.5 % IJ SOLN
9.0000 mL | INTRAMUSCULAR | Status: AC | PRN
  Administered 2021-11-29: 9 mL via INTRA_ARTICULAR

## 2021-12-05 ENCOUNTER — Other Ambulatory Visit: Payer: Self-pay | Admitting: Cardiovascular Disease

## 2021-12-31 ENCOUNTER — Ambulatory Visit (INDEPENDENT_AMBULATORY_CARE_PROVIDER_SITE_OTHER): Payer: Medicare Other | Admitting: Ophthalmology

## 2021-12-31 ENCOUNTER — Encounter (INDEPENDENT_AMBULATORY_CARE_PROVIDER_SITE_OTHER): Payer: Self-pay | Admitting: Ophthalmology

## 2021-12-31 DIAGNOSIS — H353221 Exudative age-related macular degeneration, left eye, with active choroidal neovascularization: Secondary | ICD-10-CM

## 2021-12-31 MED ORDER — FARICIMAB-SVOA 6 MG/0.05ML IZ SOLN
6.0000 mg | INTRAVITREAL | Status: AC | PRN
  Administered 2021-12-31: 6 mg via INTRAVITREAL
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
## 2021-12-31 NOTE — Assessment & Plan Note (Signed)
The nature of wet macular degeneration was discussed with the patient.  Forms of therapy reviewed include the use of Anti-VEGF medications injected painlessly into the eye, as well as other possible treatment modalities, including thermal laser therapy. Fellow eye involvement and risks were discussed with the patient. Upon the finding of wet age related macular degeneration, treatment will be offered. The treatment regimen is on a treat as needed basis with the intent to treat if necessary and extend interval of exams when possible. On average 1 out of 6 patients do not need lifetime therapy. However, the risk of recurrent disease is high for a lifetime.  Initially monthly, then periodic, examinations and evaluations will determine whether the next treatment is required on the day of the examination.  Left vastly improved overall nail of the Vabysmo persistent subretinal fluid in the past other medications now responsive to Vabysmo.  We will need to repeat today at 5-week interval and extend interval examination now after successful 2 treatment intervals now to 7 weeks.

## 2022-01-05 ENCOUNTER — Encounter (INDEPENDENT_AMBULATORY_CARE_PROVIDER_SITE_OTHER): Payer: Self-pay

## 2022-01-09 ENCOUNTER — Telehealth: Payer: Self-pay | Admitting: Cardiovascular Disease

## 2022-01-09 NOTE — Telephone Encounter (Signed)
Per PCP [facility] note "MDM: We will check kidney function. If her kidney function is reasonable then we will increase her Lasix to 120 mg total until she decreases the edema. Keflex to cover for cellulitis given the warmth of her legs. Blood pressure is up today systolic though diastolic is less than 60"  She needs to ensure she is restricting to less than 2L fluid, following low salt diet, elevating legs when sitting, wearing compression socks if she has them.   Proceed with medication changes as recommended by PCP.   Loel Dubonnet, NP

## 2022-01-09 NOTE — Telephone Encounter (Signed)
Pt c/o swelling: STAT is pt has developed SOB within 24 hours  How much weight have you gained and in what time span?  About 4 lbs, sister unsure of time span   If swelling, where is the swelling located?  Legs, stomach  Are you currently taking a fluid pill?  Yes--patient saw PCP yesterday and was advised to take an extra Furosemide  Are you currently SOB?  Has been SOB more recently, sister not currently with the patient  Do you have a log of your daily weights (if so, list)?  Normally weighs about 210 lbs Yesterday patient weighed 215 lbs   Have you gained 3 pounds in a day or 5 pounds in a week?   Have you traveled recently?  No

## 2022-01-09 NOTE — Telephone Encounter (Signed)
Sister reported patient had lower extremity and abdominal edema on Sunday. She went to PCP yesterday ([facility]). BNP 1023. She had SOB yesterday and last PM. Made appointment with CGilford Rile for 9/29. Recommended to sister that if SOB worsens, patient needs to go to the ED. She verbalized understanding.

## 2022-01-09 NOTE — Telephone Encounter (Signed)
Spoke with sister [phone]). Gave her the recommendations of C. Gilford Rile, NP: lasix '120mg'$  daily until swelling goes down, restrict fluids to less than 2L/day, follow low salt diet, elevate legs when sitting, and wearing compression stockings if she has them. Patient has been measured for support stockings, but they always roll down. Recommended she not use stockings. Recommended that while sitting with legs elevated, to do ankle pump exercises. Sister verbalized understanding and will communicate this message to patient.

## 2022-01-10 NOTE — Progress Notes (Unsigned)
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 Relishes. Regular salad dressings. Salsa. Other foods Salted popcorn and pretzels. Corn chips and puffs. Potato and tortilla chips. Canned or dried soups. Pizza. Frozen entrees and pot pies. The items listed above may not be a complete list of foods and beverages you should avoid. Contact a dietitian for more information. Summary Eating less sodium can help lower your blood pressure, reduce swelling, and protect your heart, liver, and kidneys. Most people on this plan should limit their sodium intake to 1,500-2,000 mg (milligrams) of sodium each day. Canned, boxed, and frozen foods are high in sodium. Restaurant foods, fast foods, and pizza are also very high in sodium. You also get sodium by adding salt to food. Try to cook at home, eat more fresh fruits and vegetables, and eat less fast food and canned, processed, or prepared foods. This  information is not intended to replace advice given to you by your health care provider. Make sure you discuss any questions you have with your health care provider. Document Revised: 05/07/2019 Document Reviewed: 03/03/2019 Elsevier Patient Education  2023 [place], Finis Bud, [place]  01/12/2022 9:24 AM    [place]

## 2022-01-11 ENCOUNTER — Encounter (HOSPITAL_BASED_OUTPATIENT_CLINIC_OR_DEPARTMENT_OTHER): Payer: Self-pay | Admitting: Nurse Practitioner

## 2022-01-11 ENCOUNTER — Ambulatory Visit (INDEPENDENT_AMBULATORY_CARE_PROVIDER_SITE_OTHER): Payer: Medicare Other | Admitting: Nurse Practitioner

## 2022-01-11 VITALS — BP 131/66 | HR 55 | Ht 63.0 in | Wt 218.2 lb

## 2022-01-11 DIAGNOSIS — Z79899 Other long term (current) drug therapy: Secondary | ICD-10-CM

## 2022-01-11 DIAGNOSIS — E785 Hyperlipidemia, unspecified: Secondary | ICD-10-CM

## 2022-01-11 DIAGNOSIS — N1831 Chronic kidney disease, stage 3a: Secondary | ICD-10-CM

## 2022-01-11 DIAGNOSIS — I10 Essential (primary) hypertension: Secondary | ICD-10-CM

## 2022-01-11 DIAGNOSIS — R600 Localized edema: Secondary | ICD-10-CM

## 2022-01-11 DIAGNOSIS — R0609 Other forms of dyspnea: Secondary | ICD-10-CM | POA: Diagnosis not present

## 2022-01-11 DIAGNOSIS — Z86718 Personal history of other venous thrombosis and embolism: Secondary | ICD-10-CM

## 2022-01-11 DIAGNOSIS — I739 Peripheral vascular disease, unspecified: Secondary | ICD-10-CM

## 2022-01-11 NOTE — Patient Instructions (Signed)
Medication Instructions:  Your Physician recommend you continue on your current medication as directed.    *If you need a refill on your cardiac medications before your next appointment, please call your pharmacy*   Lab Work: Your physician recommends that you return for lab work today- BMP   If you have labs (blood work) drawn today and your tests are completely normal, you will receive your results only by: MyChart Message (if you have MyChart) OR A paper copy in the mail If you have any lab test that is abnormal or we need to change your treatment, we will call you to review the results.   Testing/Procedures: Your physician has requested that you have an echocardiogram. Echocardiography is a painless test that uses sound waves to create images of your heart. It provides your doctor with information about the size and shape of your heart and how well your heart's chambers and valves are working. This procedure takes approximately one hour. There are no restrictions for this procedure. [place], you and your health needs are our priority.  As part of our continuing mission to provide you with exceptional heart care, we have created designated Provider Care Teams.  These Care Teams include your primary Cardiologist (physician) and Advanced Practice Providers (APPs -  Physician Assistants and Nurse Practitioners) who all work together to provide you with the care you need, when you need it.  We recommend signing up for the patient portal called "MyChart".  Sign up information is provided on this After Visit Summary.  MyChart is used to connect with patients for Virtual Visits (Telemedicine).  Patients are able to view lab/test results, encounter notes, upcoming appointments, etc.  Non-urgent messages can be sent to your provider as well.   To learn more about what you can do with MyChart, go to [email or web address].    Your next  appointment:   3-4 week(s)  The format for your next appointment:   In Person  Provider:   [name], NP and Finis Bud, NP   Other Instructions Heart Healthy Diet Recommendations: A low-salt diet is recommended. Meats should be grilled, baked, or boiled. Avoid fried foods. Focus on lean protein sources like fish or chicken with vegetables and fruits. The American Heart Association is a Microbiologist!  American Heart Association Diet and Lifeystyle Recommendations   Exercise recommendations: The American Heart Association recommends 150 minutes of moderate intensity exercise weekly. Try 30 minutes of moderate intensity exercise 4-5 times per week. This could include walking, jogging, or swimming.  Low-Sodium Eating Plan Sodium, which is an element that makes up salt, helps you maintain a healthy balance of fluids in your body. Too much sodium can increase your blood pressure and cause fluid and waste to be held in your body. Your health care provider or dietitian may recommend following this plan if you have high blood pressure (hypertension), kidney disease, liver disease, or heart failure. Eating less sodium can help lower your blood pressure, reduce swelling, and protect your heart, liver, and kidneys. What are tips for following this plan? Reading food labels The Nutrition Facts label lists the amount of sodium in one serving of the food. If you eat more than one serving, you must multiply the listed amount of sodium by the number of servings. Choose foods with less than 140 mg of sodium per serving. Avoid foods with 300 mg of sodium or more per serving. Shopping  Look for lower-sodium  products, often labeled as "low-sodium" or "no salt added." Always check the sodium content, even if foods are labeled as "unsalted" or "no salt added." Buy fresh foods. Avoid canned foods and pre-made or frozen meals. Avoid canned, cured, or processed meats. Buy breads that have less  than 80 mg of sodium per slice. Cooking  Eat more home-cooked food and less restaurant, buffet, and fast food. Avoid adding salt when cooking. Use salt-free seasonings or herbs instead of table salt or sea salt. Check with your health care provider or pharmacist before using salt substitutes. Cook with plant-based oils, such as canola, sunflower, or olive oil. Meal planning When eating at a restaurant, ask that your food be prepared with less salt or no salt, if possible. Avoid dishes labeled as brined, pickled, cured, smoked, or made with soy sauce, miso, or teriyaki sauce. Avoid foods that contain MSG (monosodium glutamate). MSG is sometimes added to [place] food, bouillon, and some canned foods. Make meals that can be grilled, baked, poached, roasted, or steamed. These are generally made with less sodium. General information Most people on this plan should limit their sodium intake to 1,500-2,000 mg (milligrams) of sodium each day. What foods should I eat? Fruits Fresh, frozen, or canned fruit. Fruit juice. Vegetables Fresh or frozen vegetables. "No salt added" canned vegetables. "No salt added" tomato sauce and paste. Low-sodium or reduced-sodium tomato and vegetable juice. Grains Low-sodium cereals, including oats, puffed wheat and rice, and shredded wheat. Low-sodium crackers. Unsalted rice. Unsalted pasta. Low-sodium bread. Whole-grain breads and whole-grain pasta. Meats and other proteins Fresh or frozen (no salt added) meat, poultry, seafood, and fish. Low-sodium canned tuna and salmon. Unsalted nuts. Dried peas, beans, and lentils without added salt. Unsalted canned beans. Eggs. Unsalted nut butters. Dairy Milk. Soy milk. Cheese that is naturally low in sodium, such as ricotta cheese, fresh mozzarella, or Swiss cheese. Low-sodium or reduced-sodium cheese. Cream cheese. Yogurt. Seasonings and condiments Fresh and dried herbs and spices. Salt-free seasonings. Low-sodium mustard and  ketchup. Sodium-free salad dressing. Sodium-free light mayonnaise. Fresh or refrigerated horseradish. Lemon juice. Vinegar. Other foods Homemade, reduced-sodium, or low-sodium soups. Unsalted popcorn and pretzels. Low-salt or salt-free chips. The items listed above may not be a complete list of foods and beverages you can eat. Contact a dietitian for more information. What foods should I avoid? Vegetables Sauerkraut, pickled vegetables, and relishes. Olives. [place] fries. Onion rings. Regular canned vegetables (not low-sodium or reduced-sodium). Regular canned tomato sauce and paste (not low-sodium or reduced-sodium). Regular tomato and vegetable juice (not low-sodium or reduced-sodium). Frozen vegetables in sauces. Grains Instant hot cereals. Bread stuffing, pancake, and biscuit mixes. Croutons. Seasoned rice or pasta mixes. Noodle soup cups. Boxed or frozen macaroni and cheese. Regular salted crackers. Self-rising flour. Meats and other proteins Meat or fish that is salted, canned, smoked, spiced, or pickled. Precooked or cured meat, such as sausages or meat loaves. Berniece Salines. Ham. Pepperoni. Hot dogs. Corned beef. Chipped beef. Salt pork. Jerky. Pickled herring. Anchovies and sardines. Regular canned tuna. Salted nuts. Dairy Processed cheese and cheese spreads. Hard cheeses. Cheese curds. Blue cheese. Feta cheese. String cheese. Regular cottage cheese. Buttermilk. Canned milk. Fats and oils Salted butter. Regular margarine. Ghee. Bacon fat. Seasonings and condiments Onion salt, garlic salt, seasoned salt, table salt, and sea salt. Canned and packaged gravies. Worcestershire sauce. Tartar sauce. Barbecue sauce. Teriyaki sauce. Soy sauce, including reduced-sodium. Steak sauce. Fish sauce. Oyster sauce. Cocktail sauce. Horseradish that you find on the shelf. Regular ketchup and mustard.  Meat flavorings and tenderizers. Bouillon cubes. Hot sauce. Pre-made or packaged marinades. Pre-made or packaged taco  seasonings. Relishes. Regular salad dressings. Salsa. Other foods Salted popcorn and pretzels. Corn chips and puffs. Potato and tortilla chips. Canned or dried soups. Pizza. Frozen entrees and pot pies. The items listed above may not be a complete list of foods and beverages you should avoid. Contact a dietitian for more information. Summary Eating less sodium can help lower your blood pressure, reduce swelling, and protect your heart, liver, and kidneys. Most people on this plan should limit their sodium intake to 1,500-2,000 mg (milligrams) of sodium each day. Canned, boxed, and frozen foods are high in sodium. Restaurant foods, fast foods, and pizza are also very high in sodium. You also get sodium by adding salt to food. Try to cook at home, eat more fresh fruits and vegetables, and eat less fast food and canned, processed, or prepared foods. This information is not intended to replace advice given to you by your health care provider. Make sure you discuss any questions you have with your health care provider. Document Revised: 05/07/2019 Document Reviewed: 03/03/2019 Elsevier Patient Education  [place].

## 2022-01-12 ENCOUNTER — Telehealth (HOSPITAL_BASED_OUTPATIENT_CLINIC_OR_DEPARTMENT_OTHER): Payer: Self-pay | Admitting: Nurse Practitioner

## 2022-01-12 ENCOUNTER — Encounter (HOSPITAL_BASED_OUTPATIENT_CLINIC_OR_DEPARTMENT_OTHER): Payer: Self-pay

## 2022-01-12 LAB — BASIC METABOLIC PANEL
BUN/Creatinine Ratio: 24 (ref 12–28)
BUN: 33 mg/dL — ABNORMAL HIGH (ref 8–27)
CO2: 27 mmol/L (ref 20–29)
Calcium: 9.6 mg/dL (ref 8.7–10.3)
Chloride: 100 mmol/L (ref 96–106)
Creatinine, Ser: 1.4 mg/dL — ABNORMAL HIGH (ref 0.57–1.00)
Glucose: 90 mg/dL (ref 70–99)
Potassium: 4.5 mmol/L (ref 3.5–5.2)
Sodium: 143 mmol/L (ref 134–144)
eGFR: 37 mL/min/{1.73_m2} — ABNORMAL LOW (ref >59)

## 2022-01-12 NOTE — Telephone Encounter (Signed)
Called and left VM on home phone (Okay per DPR) and updated patient regarding her lab results from yesterday.  Kidney function is elevated.  Serum creatinine went from 1.20 to 1.40 and eGFR decreased from 45 to 37.  This is most likely due to her increasing Lasix recently.  Discussed with her to hold Lasix for 2 days and then resume 120 mg of Lasix daily.  Will update nursing staff to repeat BMET next Wednesday, (01/16/22).   I will also send MyChart message regarding all of this information.  Finis Bud, NP

## 2022-01-14 ENCOUNTER — Telehealth (HOSPITAL_BASED_OUTPATIENT_CLINIC_OR_DEPARTMENT_OTHER): Payer: Self-pay

## 2022-01-14 ENCOUNTER — Encounter (HOSPITAL_BASED_OUTPATIENT_CLINIC_OR_DEPARTMENT_OTHER): Payer: Self-pay

## 2022-01-14 DIAGNOSIS — Z79899 Other long term (current) drug therapy: Secondary | ICD-10-CM

## 2022-01-14 NOTE — Telephone Encounter (Addendum)
Results called to patient, patient unavailable but DPR allows information to be relayed to her sister. The following results were given. Mailed lab slips.     ----- Message from Finis Bud, NP sent at 01/14/2022  8:13 AM EDT ----- Kidney function up from 1 year ago. Want to protect her kidneys so we are going to decrease Lasix dose to 40 mg BID to what she was taking previously, instead of 120 mg daily. Let's recheck a BMET in 1-2 weeks. I have sent her a MyChart message reflecting this change.   Thanks so much!   Kind Regards,  Finis Bud, NP

## 2022-01-29 ENCOUNTER — Encounter: Payer: Self-pay | Admitting: Podiatry

## 2022-01-29 ENCOUNTER — Ambulatory Visit (INDEPENDENT_AMBULATORY_CARE_PROVIDER_SITE_OTHER): Payer: Medicare Other

## 2022-01-29 ENCOUNTER — Ambulatory Visit (INDEPENDENT_AMBULATORY_CARE_PROVIDER_SITE_OTHER): Payer: Medicare Other | Admitting: Podiatry

## 2022-01-29 DIAGNOSIS — M79676 Pain in unspecified toe(s): Secondary | ICD-10-CM | POA: Diagnosis not present

## 2022-01-29 DIAGNOSIS — R0609 Other forms of dyspnea: Secondary | ICD-10-CM | POA: Diagnosis not present

## 2022-01-29 DIAGNOSIS — D2372 Other benign neoplasm of skin of left lower limb, including hip: Secondary | ICD-10-CM | POA: Diagnosis not present

## 2022-01-29 DIAGNOSIS — D2371 Other benign neoplasm of skin of right lower limb, including hip: Secondary | ICD-10-CM | POA: Diagnosis not present

## 2022-01-29 DIAGNOSIS — M722 Plantar fascial fibromatosis: Secondary | ICD-10-CM | POA: Diagnosis not present

## 2022-01-29 DIAGNOSIS — Z79899 Other long term (current) drug therapy: Secondary | ICD-10-CM | POA: Diagnosis not present

## 2022-01-29 DIAGNOSIS — B351 Tinea unguium: Secondary | ICD-10-CM

## 2022-01-29 LAB — ECHOCARDIOGRAM COMPLETE
Area-P 1/2: 3.37 cm2
MV M vel: 4.24 m/s
MV Peak grad: 71.9 mmHg
S' Lateral: 2.71 cm

## 2022-01-29 MED ORDER — TRIAMCINOLONE ACETONIDE 40 MG/ML IJ SUSP
20.0000 mg | Freq: Once | INTRAMUSCULAR | Status: AC
  Administered 2022-01-29: 20 mg
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
## 2022-01-30 LAB — BASIC METABOLIC PANEL
BUN/Creatinine Ratio: 35 — ABNORMAL HIGH (ref 12–28)
BUN: 48 mg/dL — ABNORMAL HIGH (ref 8–27)
CO2: 26 mmol/L (ref 20–29)
Calcium: 9.9 mg/dL (ref 8.7–10.3)
Chloride: 100 mmol/L (ref 96–106)
Creatinine, Ser: 1.38 mg/dL — ABNORMAL HIGH (ref 0.57–1.00)
Glucose: 83 mg/dL (ref 70–99)
Potassium: 4.9 mmol/L (ref 3.5–5.2)
Sodium: 143 mmol/L (ref 134–144)
eGFR: 38 mL/min/{1.73_m2} — ABNORMAL LOW (ref >59)

## 2022-01-30 NOTE — Addendum Note (Signed)
Addended by: Georgana Curio D on: 01/30/2022 02:15 PM   Modules accepted: Orders

## 2022-01-30 NOTE — Telephone Encounter (Signed)
Lab results called to as per Finis Bud. Labwork order entered for BMET 1 or 2 days before next appt. Pt aware, and agreeable. [facility] RN CCM

## 2022-01-30 NOTE — Progress Notes (Signed)
Attempted to contact patient with results and followup instructions, no answer or answering machine. [facility]its [facility] RN CCM

## 2022-02-04 ENCOUNTER — Telehealth: Payer: Self-pay

## 2022-02-04 DIAGNOSIS — Z79899 Other long term (current) drug therapy: Secondary | ICD-10-CM

## 2022-02-04 NOTE — Telephone Encounter (Addendum)
Left voice message for patient to give office a call for Echocardiogram results. Will try calling again.   ----- Message from Finis Bud, NP sent at 01/31/2022  5:27 PM EDT ----- Normal pumping function, EF 60-65%. Grade 2 DD, seen on previous Echo in 2020. One new finding is moderately elevated pulmonary systolic pressure, with elevated right ventricular systolic pressure. She does have Chronic Diastolic CHF and chronic kidney disease. 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 Let's increase Lasix from 40 mg BID to 60 mg BID and repeat BMET in 1 week. Potentially may start Wilder Glade in future if kidney function stable.   She needs to weight herself daily. If her weight goes up more than 2 lbs per day or more than 5 lbs per week, she needs to let us know. Low sodium diet (less than 2 grams per day), as well as fluid restriction of less than 2 L per day.   She will follow up with [name], NP in less than 2 weeks who will re-evaluate her.  Thanks!  Finis Bud, AGNP-C

## 2022-02-05 ENCOUNTER — Telehealth: Payer: Self-pay | Admitting: Nurse Practitioner

## 2022-02-05 NOTE — Telephone Encounter (Signed)
Pt. Returning your call.

## 2022-02-05 NOTE — Telephone Encounter (Signed)
Patient returned call for her echo results.  

## 2022-02-05 NOTE — Addendum Note (Signed)
Addended by: Fidel Levy on: 02/05/2022 04:19 PM   Modules accepted: Orders

## 2022-02-05 NOTE — Telephone Encounter (Signed)
Spoke with patient about lab results. Explained echo findings. She states she was told her creatinine was elevated on labs so she questioned the increased dose of lasix. Will send to E. [place] Calix NP. She will have BMET on 11/1 at office visit with Urban Gibson NP   Finis Bud, NP  01/30/2022 10:11 AM EDT Back to Top    Kidney function still elevated, but stable and is slightly trending down from 2 weeks ago.  Lets continue Lasix 40 mg twice daily until she follows up with [name], NP on November 1 which is 2 weeks from today.   Lets obtain BMET either a day or 2 before the Nov 1st appt so Urban Gibson can review this with patient.     Thanks so much!    Finis Bud, AGNP-C

## 2022-02-12 LAB — BASIC METABOLIC PANEL
BUN/Creatinine Ratio: 27 (ref 12–28)
BUN: 37 mg/dL — ABNORMAL HIGH (ref 8–27)
CO2: 30 mmol/L — ABNORMAL HIGH (ref 20–29)
Calcium: 9.5 mg/dL (ref 8.7–10.3)
Chloride: 101 mmol/L (ref 96–106)
Creatinine, Ser: 1.35 mg/dL — ABNORMAL HIGH (ref 0.57–1.00)
Glucose: 107 mg/dL — ABNORMAL HIGH (ref 70–99)
Potassium: 4.6 mmol/L (ref 3.5–5.2)
Sodium: 144 mmol/L (ref 134–144)
eGFR: 39 mL/min/{1.73_m2} — ABNORMAL LOW (ref >59)
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
## 2022-02-13 ENCOUNTER — Ambulatory Visit (INDEPENDENT_AMBULATORY_CARE_PROVIDER_SITE_OTHER): Payer: Medicare Other | Admitting: Family

## 2022-02-13 ENCOUNTER — Encounter (HOSPITAL_BASED_OUTPATIENT_CLINIC_OR_DEPARTMENT_OTHER): Payer: Self-pay | Admitting: Family

## 2022-02-13 ENCOUNTER — Telehealth (HOSPITAL_BASED_OUTPATIENT_CLINIC_OR_DEPARTMENT_OTHER): Payer: Self-pay

## 2022-02-13 VITALS — BP 130/64 | HR 54 | Ht 63.0 in | Wt 212.2 lb

## 2022-02-13 DIAGNOSIS — I5032 Chronic diastolic (congestive) heart failure: Secondary | ICD-10-CM | POA: Diagnosis not present

## 2022-02-13 DIAGNOSIS — R0609 Other forms of dyspnea: Secondary | ICD-10-CM

## 2022-02-13 DIAGNOSIS — I10 Essential (primary) hypertension: Secondary | ICD-10-CM | POA: Diagnosis not present

## 2022-02-13 DIAGNOSIS — I6523 Occlusion and stenosis of bilateral carotid arteries: Secondary | ICD-10-CM

## 2022-02-13 DIAGNOSIS — N1832 Chronic kidney disease, stage 3b: Secondary | ICD-10-CM

## 2022-02-13 DIAGNOSIS — R600 Localized edema: Secondary | ICD-10-CM

## 2022-02-13 DIAGNOSIS — E782 Mixed hyperlipidemia: Secondary | ICD-10-CM

## 2022-02-13 DIAGNOSIS — I272 Pulmonary hypertension, unspecified: Secondary | ICD-10-CM | POA: Diagnosis not present

## 2022-02-13 MED ORDER — ENTRESTO 24-26 MG PO TABS: 1.0000 | ORAL_TABLET | Freq: Two times a day (BID) | ORAL | 5 refills | Status: DC

## 2022-02-13 NOTE — Patient Instructions (Addendum)
Medication Instructions:  Your physician has recommended you make the following change in your medication:   STOP Losartan  START Entresto 24-'26mg'$  one tablet twice daily   *If you need a refill on your cardiac medications before your next appointment, please call your pharmacy*   Lab Work: Your physician recommends that you return for lab work in 2-3 weeks for BMP  Please return for Lab work. You may come to the...   [facility] (3rd floor) [address], [place], [place] [address]  Open: 8am-Noon and 1pm-4:30pm  Please ring the doorbell on the small table when you exit the elevator and the Lab Tech will come get you  [place] [facility] [address] [place], [place], [place] [address] Open: 8am-1pm, then 2pm-4:30pm   [place]- Please see attached locations sheet stapled to your lab work with address and hours.    If you have labs (blood work) drawn today and your tests are completely normal, you will receive your results only by: [place] (if you have MyChart) OR A paper copy in the mail If you have any lab test that is abnormal or we need to change your treatment, we will call you to review the results.   Testing/Procedures: Your echocardiogram showed diastolic heart failure and pulmonary hypersion.    Follow-Up: At [facility], you and your health needs are our priority.  As part of our continuing mission to provide you with exceptional heart care, we have created designated Provider Care Teams.  These Care Teams include your primary Cardiologist (physician) and Advanced Practice Providers (APPs -  Physician Assistants and Nurse Practitioners) who all work together to provide you with the care you need, when you need it.  We recommend signing up for the patient portal called "MyChart".  Sign up information is provided on this After Visit Summary.  MyChart is used to connect with patients for Virtual Visits  (Telemedicine).  Patients are able to view lab/test results, encounter notes, upcoming appointments, etc.  Non-urgent messages can be sent to your provider as well.   To learn more about what you can do with MyChart, go to [email or web address].    Your next appointment:   2 month(s)  The format for your next appointment:   In Person  Provider:   Quay Burow, MD  or Loel Dubonnet, NP    Other Instructions  Heart Healthy Diet Recommendations: A low-salt diet is recommended. Meats should be grilled, baked, or boiled. Avoid fried foods. Focus on lean protein sources like fish or chicken with vegetables and fruits. The American Heart Association is a Microbiologist!  American Heart Association Diet and Lifeystyle Recommendations   Exercise recommendations: The American Heart Association recommends 150 minutes of moderate intensity exercise weekly. Try 30 minutes of moderate intensity exercise 4-5 times per week. This could include walking, jogging, or swimming.  To prevent or reduce lower extremity swelling: Eat a low salt diet. Salt makes the body hold onto extra fluid which causes swelling. Sit with legs elevated. For example, in the recliner or on an [place].  Wear knee-high compression stockings during the daytime. Ones labeled 15-20 mmHg provide good compression.   Important Information About Sugar

## 2022-02-13 NOTE — Telephone Encounter (Signed)
Entresto Prior Auth attempted, patient insurance not on file. Called pharmacy to verify need for Prior Auth   Prior Auth not required per pharmacy and is ready for pick up for $10.

## 2022-02-18 ENCOUNTER — Encounter (INDEPENDENT_AMBULATORY_CARE_PROVIDER_SITE_OTHER): Payer: Self-pay

## 2022-02-18 ENCOUNTER — Encounter (INDEPENDENT_AMBULATORY_CARE_PROVIDER_SITE_OTHER): Payer: Medicare Other | Admitting: Ophthalmology

## 2022-02-25 ENCOUNTER — Other Ambulatory Visit: Payer: Self-pay

## 2022-02-25 DIAGNOSIS — Z79899 Other long term (current) drug therapy: Secondary | ICD-10-CM

## 2022-02-26 ENCOUNTER — Telehealth (HOSPITAL_BASED_OUTPATIENT_CLINIC_OR_DEPARTMENT_OTHER): Payer: Self-pay

## 2022-02-26 NOTE — Telephone Encounter (Signed)
Fax received, patient notified

## 2022-03-04 LAB — BASIC METABOLIC PANEL
BUN/Creatinine Ratio: 35 — ABNORMAL HIGH (ref 12–28)
BUN: 42 mg/dL — ABNORMAL HIGH (ref 8–27)
CO2: 26 mmol/L (ref 20–29)
Calcium: 9.4 mg/dL (ref 8.7–10.3)
Chloride: 101 mmol/L (ref 96–106)
Creatinine, Ser: 1.2 mg/dL — ABNORMAL HIGH (ref 0.57–1.00)
Glucose: 71 mg/dL (ref 70–99)
Potassium: 4.7 mmol/L (ref 3.5–5.2)
Sodium: 140 mmol/L (ref 134–144)
eGFR: 45 mL/min/{1.73_m2} — ABNORMAL LOW (ref >59)

## 2022-04-03 ENCOUNTER — Telehealth: Payer: Self-pay | Admitting: Pulmonary Disease

## 2022-04-03 NOTE — Telephone Encounter (Signed)
DL shows residual AH of 10/h Needs 1 yr FU OV

## 2022-04-03 NOTE — Telephone Encounter (Signed)
Recall placed for one year f/u

## 2022-04-21 ENCOUNTER — Other Ambulatory Visit: Payer: Self-pay

## 2022-04-21 ENCOUNTER — Encounter (HOSPITAL_COMMUNITY): Payer: Self-pay

## 2022-04-21 ENCOUNTER — Emergency Department (HOSPITAL_COMMUNITY): Payer: Medicare Other

## 2022-04-21 ENCOUNTER — Inpatient Hospital Stay (HOSPITAL_COMMUNITY)
Admission: EM | Admit: 2022-04-21 | Discharge: 2022-04-24 | DRG: 287 | Disposition: A | Discharge status: Home / Self Care | Payer: Medicare Other | Attending: Family Medicine | Admitting: Family Medicine

## 2022-04-21 ENCOUNTER — Encounter (HOSPITAL_COMMUNITY): Payer: Self-pay | Admitting: Emergency Medicine

## 2022-04-21 DIAGNOSIS — Z87891 Personal history of nicotine dependence: Secondary | ICD-10-CM

## 2022-04-21 DIAGNOSIS — E782 Mixed hyperlipidemia: Secondary | ICD-10-CM | POA: Diagnosis present

## 2022-04-21 DIAGNOSIS — Z1152 Encounter for screening for COVID-19: Secondary | ICD-10-CM

## 2022-04-21 DIAGNOSIS — Z981 Arthrodesis status: Secondary | ICD-10-CM

## 2022-04-21 DIAGNOSIS — I2510 Atherosclerotic heart disease of native coronary artery without angina pectoris: Secondary | ICD-10-CM | POA: Diagnosis present

## 2022-04-21 DIAGNOSIS — G8929 Other chronic pain: Secondary | ICD-10-CM | POA: Diagnosis present

## 2022-04-21 DIAGNOSIS — Z96652 Presence of left artificial knee joint: Secondary | ICD-10-CM | POA: Diagnosis present

## 2022-04-21 DIAGNOSIS — N183 Chronic kidney disease, stage 3 unspecified: Secondary | ICD-10-CM | POA: Diagnosis present

## 2022-04-21 DIAGNOSIS — I493 Ventricular premature depolarization: Secondary | ICD-10-CM | POA: Diagnosis present

## 2022-04-21 DIAGNOSIS — M810 Age-related osteoporosis without current pathological fracture: Secondary | ICD-10-CM | POA: Diagnosis present

## 2022-04-21 DIAGNOSIS — R296 Repeated falls: Secondary | ICD-10-CM | POA: Diagnosis present

## 2022-04-21 DIAGNOSIS — Z8542 Personal history of malignant neoplasm of other parts of uterus: Secondary | ICD-10-CM

## 2022-04-21 DIAGNOSIS — I10 Essential (primary) hypertension: Secondary | ICD-10-CM | POA: Diagnosis present

## 2022-04-21 DIAGNOSIS — H3530 Unspecified macular degeneration: Secondary | ICD-10-CM | POA: Diagnosis present

## 2022-04-21 DIAGNOSIS — F32A Depression, unspecified: Secondary | ICD-10-CM | POA: Diagnosis present

## 2022-04-21 DIAGNOSIS — Z885 Allergy status to narcotic agent status: Secondary | ICD-10-CM

## 2022-04-21 DIAGNOSIS — R008 Other abnormalities of heart beat: Secondary | ICD-10-CM | POA: Diagnosis present

## 2022-04-21 DIAGNOSIS — G2581 Restless legs syndrome: Secondary | ICD-10-CM | POA: Diagnosis present

## 2022-04-21 DIAGNOSIS — Z66 Do not resuscitate: Secondary | ICD-10-CM | POA: Diagnosis present

## 2022-04-21 DIAGNOSIS — Z87442 Personal history of urinary calculi: Secondary | ICD-10-CM

## 2022-04-21 DIAGNOSIS — E1151 Type 2 diabetes mellitus with diabetic peripheral angiopathy without gangrene: Secondary | ICD-10-CM | POA: Diagnosis present

## 2022-04-21 DIAGNOSIS — Z8614 Personal history of Methicillin resistant Staphylococcus aureus infection: Secondary | ICD-10-CM

## 2022-04-21 DIAGNOSIS — I130 Hypertensive heart and chronic kidney disease with heart failure and stage 1 through stage 4 chronic kidney disease, or unspecified chronic kidney disease: Secondary | ICD-10-CM | POA: Diagnosis present

## 2022-04-21 DIAGNOSIS — E559 Vitamin D deficiency, unspecified: Secondary | ICD-10-CM | POA: Diagnosis present

## 2022-04-21 DIAGNOSIS — Z90710 Acquired absence of both cervix and uterus: Secondary | ICD-10-CM

## 2022-04-21 DIAGNOSIS — Z6837 Body mass index (BMI) 37.0-37.9, adult: Secondary | ICD-10-CM

## 2022-04-21 DIAGNOSIS — I4719 Other supraventricular tachycardia: Secondary | ICD-10-CM

## 2022-04-21 DIAGNOSIS — K219 Gastro-esophageal reflux disease without esophagitis: Secondary | ICD-10-CM | POA: Diagnosis present

## 2022-04-21 DIAGNOSIS — I071 Rheumatic tricuspid insufficiency: Secondary | ICD-10-CM | POA: Diagnosis present

## 2022-04-21 DIAGNOSIS — Z9049 Acquired absence of other specified parts of digestive tract: Secondary | ICD-10-CM

## 2022-04-21 DIAGNOSIS — R079 Chest pain, unspecified: Secondary | ICD-10-CM | POA: Diagnosis present

## 2022-04-21 DIAGNOSIS — Z809 Family history of malignant neoplasm, unspecified: Secondary | ICD-10-CM

## 2022-04-21 DIAGNOSIS — I5032 Chronic diastolic (congestive) heart failure: Secondary | ICD-10-CM | POA: Insufficient documentation

## 2022-04-21 DIAGNOSIS — R0789 Other chest pain: Principal | ICD-10-CM | POA: Diagnosis present

## 2022-04-21 DIAGNOSIS — M549 Dorsalgia, unspecified: Secondary | ICD-10-CM | POA: Diagnosis present

## 2022-04-21 DIAGNOSIS — R7989 Other specified abnormal findings of blood chemistry: Secondary | ICD-10-CM | POA: Insufficient documentation

## 2022-04-21 DIAGNOSIS — E1122 Type 2 diabetes mellitus with diabetic chronic kidney disease: Secondary | ICD-10-CM | POA: Diagnosis present

## 2022-04-21 DIAGNOSIS — E669 Obesity, unspecified: Secondary | ICD-10-CM | POA: Diagnosis present

## 2022-04-21 DIAGNOSIS — F419 Anxiety disorder, unspecified: Secondary | ICD-10-CM | POA: Diagnosis present

## 2022-04-21 DIAGNOSIS — Z79899 Other long term (current) drug therapy: Secondary | ICD-10-CM

## 2022-04-21 DIAGNOSIS — G629 Polyneuropathy, unspecified: Secondary | ICD-10-CM | POA: Diagnosis present

## 2022-04-21 DIAGNOSIS — Z9841 Cataract extraction status, right eye: Secondary | ICD-10-CM

## 2022-04-21 DIAGNOSIS — H547 Unspecified visual loss: Secondary | ICD-10-CM | POA: Diagnosis present

## 2022-04-21 DIAGNOSIS — Z8616 Personal history of COVID-19: Secondary | ICD-10-CM

## 2022-04-21 DIAGNOSIS — Z86718 Personal history of other venous thrombosis and embolism: Secondary | ICD-10-CM

## 2022-04-21 DIAGNOSIS — Z91018 Allergy to other foods: Secondary | ICD-10-CM

## 2022-04-21 DIAGNOSIS — Z9842 Cataract extraction status, left eye: Secondary | ICD-10-CM

## 2022-04-21 DIAGNOSIS — Z8249 Family history of ischemic heart disease and other diseases of the circulatory system: Secondary | ICD-10-CM

## 2022-04-21 LAB — COMPREHENSIVE METABOLIC PANEL
ALT: 29 U/L (ref 0–44)
AST: 29 U/L (ref 15–41)
Albumin: 3.7 g/dL (ref 3.5–5.0)
Alkaline Phosphatase: 83 U/L (ref 38–126)
Anion gap: 11 (ref 5–15)
BUN: 42 mg/dL — ABNORMAL HIGH (ref 8–23)
CO2: 27 mmol/L (ref 22–32)
Calcium: 9.3 mg/dL (ref 8.9–10.3)
Chloride: 98 mmol/L (ref 98–111)
Creatinine, Ser: 1.19 mg/dL — ABNORMAL HIGH (ref 0.44–1.00)
GFR, Estimated: 45 mL/min — ABNORMAL LOW (ref >60)
Glucose, Bld: 183 mg/dL — ABNORMAL HIGH (ref 70–99)
Potassium: 3.5 mmol/L (ref 3.5–5.1)
Sodium: 136 mmol/L (ref 135–145)
Total Bilirubin: 0.8 mg/dL (ref 0.3–1.2)
Total Protein: 6.7 g/dL (ref 6.5–8.1)

## 2022-04-21 LAB — RESP PANEL BY RT-PCR (RSV, FLU A&B, COVID)  RVPGX2
Influenza A by PCR: NEGATIVE
Influenza B by PCR: NEGATIVE
Resp Syncytial Virus by PCR: NEGATIVE
SARS Coronavirus 2 by RT PCR: NEGATIVE

## 2022-04-21 LAB — TROPONIN I (HIGH SENSITIVITY)
Troponin I (High Sensitivity): 6 ng/L (ref <18)
Troponin I (High Sensitivity): 10 ng/L (ref <18)

## 2022-04-21 LAB — CBC WITH DIFFERENTIAL/PLATELET
Abs Immature Granulocytes: 0.01 10*3/uL (ref 0.00–0.07)
Basophils Absolute: 0.1 10*3/uL (ref 0.0–0.1)
Basophils Relative: 1 %
Eosinophils Absolute: 0.2 10*3/uL (ref 0.0–0.5)
Eosinophils Relative: 4 %
HCT: 40.2 % (ref 36.0–46.0)
Hemoglobin: 12.9 g/dL (ref 12.0–15.0)
Immature Granulocytes: 0 %
Lymphocytes Relative: 42 %
Lymphs Abs: 2.4 10*3/uL (ref 0.7–4.0)
MCH: 31.7 pg (ref 26.0–34.0)
MCHC: 32.1 g/dL (ref 30.0–36.0)
MCV: 98.8 fL (ref 80.0–100.0)
Monocytes Absolute: 0.4 10*3/uL (ref 0.1–1.0)
Monocytes Relative: 8 %
Neutro Abs: 2.6 10*3/uL (ref 1.7–7.7)
Neutrophils Relative %: 45 %
Platelets: 173 10*3/uL (ref 150–400)
RBC: 4.07 MIL/uL (ref 3.87–5.11)
RDW: 13.9 % (ref 11.5–15.5)
WBC: 5.8 10*3/uL (ref 4.0–10.5)
nRBC: 0 % (ref 0.0–0.2)

## 2022-04-21 LAB — D-DIMER, QUANTITATIVE: D-Dimer, Quant: 2.02 ug/mL-FEU — ABNORMAL HIGH (ref 0.00–0.50)

## 2022-04-21 MED ORDER — VENLAFAXINE HCL ER 75 MG PO CP24
75.0000 mg | ORAL_CAPSULE | Freq: Every day | ORAL | Status: DC
  Administered 2022-04-22 – 2022-04-24 (×3): 75 mg via ORAL
  Filled 2022-04-21: qty 1
  Filled 2022-04-21 (×2): qty 2
  Filled 2022-04-21: qty 1
  Filled 2022-04-21: qty 2

## 2022-04-21 MED ORDER — POLYETHYLENE GLYCOL 3350 17 G PO PACK
17.0000 g | PACK | Freq: Every day | ORAL | Status: DC
  Administered 2022-04-22 – 2022-04-24 (×3): 17 g via ORAL
  Filled 2022-04-21 (×3): qty 1

## 2022-04-21 MED ORDER — ACETAMINOPHEN 325 MG PO TABS
650.0000 mg | ORAL_TABLET | ORAL | Status: DC | PRN
  Administered 2022-04-22: 650 mg via ORAL
  Filled 2022-04-21: qty 2

## 2022-04-21 MED ORDER — SACUBITRIL-VALSARTAN 24-26 MG PO TABS
1.0000 | ORAL_TABLET | Freq: Two times a day (BID) | ORAL | Status: DC
  Administered 2022-04-21 – 2022-04-22 (×3): 1 via ORAL
  Filled 2022-04-21 (×4): qty 1

## 2022-04-21 MED ORDER — ONDANSETRON HCL 4 MG/2ML IJ SOLN: 4.0000 mg | Freq: Four times a day (QID) | INTRAMUSCULAR | Status: DC | PRN

## 2022-04-21 MED ORDER — PANTOPRAZOLE SODIUM 40 MG IV SOLR
40.0000 mg | Freq: Once | INTRAVENOUS | Status: AC
  Administered 2022-04-21: 40 mg via INTRAVENOUS
  Filled 2022-04-21: qty 10

## 2022-04-21 MED ORDER — TRAZODONE HCL 100 MG PO TABS
100.0000 mg | ORAL_TABLET | Freq: Every day | ORAL | Status: DC
  Administered 2022-04-21 – 2022-04-23 (×3): 100 mg via ORAL
  Filled 2022-04-21: qty 1
  Filled 2022-04-21 (×2): qty 2

## 2022-04-21 MED ORDER — METOPROLOL SUCCINATE ER 25 MG PO TB24
25.0000 mg | ORAL_TABLET | Freq: Every day | ORAL | Status: DC
  Administered 2022-04-22: 25 mg via ORAL
  Filled 2022-04-21: qty 1

## 2022-04-21 MED ORDER — IOHEXOL 350 MG/ML SOLN
75.0000 mL | Freq: Once | INTRAVENOUS | Status: AC | PRN
  Administered 2022-04-21: 75 mL via INTRAVENOUS

## 2022-04-21 MED ORDER — AMLODIPINE BESYLATE 2.5 MG PO TABS
2.5000 mg | ORAL_TABLET | Freq: Every day | ORAL | Status: DC
  Administered 2022-04-22 – 2022-04-23 (×2): 2.5 mg via ORAL
  Filled 2022-04-21 (×3): qty 1

## 2022-04-21 MED ORDER — ATORVASTATIN CALCIUM 80 MG PO TABS
80.0000 mg | ORAL_TABLET | Freq: Every day | ORAL | Status: DC
  Administered 2022-04-21 – 2022-04-23 (×3): 80 mg via ORAL
  Filled 2022-04-21: qty 2
  Filled 2022-04-21: qty 1
  Filled 2022-04-21: qty 2

## 2022-04-21 MED ORDER — FUROSEMIDE 10 MG/ML IJ SOLN
40.0000 mg | Freq: Once | INTRAMUSCULAR | Status: AC
  Administered 2022-04-21: 40 mg via INTRAVENOUS
  Filled 2022-04-21: qty 4

## 2022-04-21 MED ORDER — MORPHINE SULFATE (PF) 2 MG/ML IV SOLN
2.0000 mg | Freq: Once | INTRAVENOUS | Status: AC
  Administered 2022-04-21: 2 mg via INTRAVENOUS
  Filled 2022-04-21: qty 1

## 2022-04-21 MED ORDER — SODIUM CHLORIDE 0.9 % IV BOLUS
500.0000 mL | Freq: Once | INTRAVENOUS | Status: AC
  Administered 2022-04-21: 500 mL via INTRAVENOUS

## 2022-04-21 MED ORDER — ALPRAZOLAM 0.25 MG PO TABS
0.2500 mg | ORAL_TABLET | Freq: Three times a day (TID) | ORAL | Status: DC | PRN
  Administered 2022-04-22: 0.25 mg via ORAL
  Filled 2022-04-21: qty 1

## 2022-04-21 MED ORDER — NITROGLYCERIN 0.4 MG SL SUBL
0.4000 mg | SUBLINGUAL_TABLET | Freq: Once | SUBLINGUAL | Status: AC
  Administered 2022-04-21: 0.4 mg via SUBLINGUAL
  Filled 2022-04-21: qty 1

## 2022-04-21 MED ORDER — HEPARIN SODIUM (PORCINE) 5000 UNIT/ML IJ SOLN
5000.0000 [IU] | Freq: Three times a day (TID) | INTRAMUSCULAR | Status: DC
  Administered 2022-04-21 – 2022-04-24 (×8): 5000 [IU] via SUBCUTANEOUS
  Filled 2022-04-21 (×8): qty 1

## 2022-04-21 NOTE — ED Notes (Signed)
NTG SL administered 3-4 minutes ago w/ no change in pain.

## 2022-04-21 NOTE — Assessment & Plan Note (Signed)
-   Last echo was in October 2023 and showed ejection fraction of 60-65% with grade 2 diastolic dysfunction - Continue statin, metoprolol, Entresto - Chest x-ray has interstitial findings that could be consistent with fluid overload, patient has shortness of breath reports peripheral edema - One-time 40 mg IV Lasix and then reassess - Continue to monitor on telemetry

## 2022-04-21 NOTE — Assessment & Plan Note (Signed)
-   D-dimer elevated at 2.02 - CTA does not show, but does show possible scarring versus interstitial pneumonia

## 2022-04-21 NOTE — ED Notes (Signed)
Pts bed has been cleaned and changed due to purewick malfunction. Pt has a new purewick in placed and resting in the bed at this time.

## 2022-04-21 NOTE — ED Provider Notes (Signed)
[facility] EMERGENCY DEPARTMENT Provider Note   CSN: [id number] Arrival date & time: 04/21/22  1552     History {Add pertinent medical, surgical, social history, OB history to HPI:1} Chief Complaint  Patient presents with   Chest Pain    Leslie Hendricks is a 82 y.o. female.  Patient has a history of hypertension and GERD.  Patient started with chest pain at 230 today.   Chest Pain      Home Medications Prior to Admission medications   Medication Sig Start Date End Date Taking? Authorizing Provider  amLODipine (NORVASC) 2.5 MG tablet Take 1 tablet (2.5 mg total) by mouth daily. Take 1 tablet by mouth once daily. Keep upcoming appointment for future refills. 05/21/21   Lorretta Harp, MD  atorvastatin (LIPITOR) 80 MG tablet Take 80 mg by mouth at bedtime.    [provider]  Carboxymethylcellulose Sodium (ARTIFICIAL TEARS OP) Apply to eye daily.    [provider]  Cholecalciferol (VITAMIN D-3) 1000 UNITS CAPS Take 1 capsule by mouth daily.    [provider]  folic acid (FOLVITE) 671 MCG tablet Take 400 mcg by mouth daily.    [provider]  furosemide (LASIX) 40 MG tablet Take 1 tablet by mouth twice daily 12/07/21   Lorretta Harp, MD  HYDROcodone-acetaminophen [facility]) 7.5-325 MG tablet take 1 tablet by oral route  every 8 hours as needed for pain (DNF 09/02/20) 08/24/20   [provider]  LINZESS 72 MCG capsule Take 72 mcg by mouth every morning. 08/24/20   [provider]  Magnesium 250 MG TABS Take 1 tablet by mouth daily.    [provider]  metoprolol succinate (TOPROL-XL) 25 MG 24 hr tablet TAKE 1 TABLET BY MOUTH ONCE DAILY. CAN TAKE AN EXTRA 1/2 TABLET FOR PALPITATION 10/09/21   Lorretta Harp, MD  Multiple Vitamins-Minerals (PRESERVISION/LUTEIN) CAPS Take 1 capsule by mouth 2 (two) times daily. 12/29/20   [provider]  nystatin (MYCOSTATIN/NYSTOP) powder Apply 1 application topically 3  (three) times daily as needed (rash). 06/20/20   [provider]  Polyethyl Glycol-Propyl Glycol (SYSTANE OP) Place 1 drop into both eyes 2 (two) times daily.     [provider]  polyethylene glycol (MIRALAX / GLYCOLAX) 17 g packet Take 17 g by mouth daily. 11/27/20   Orson Eva, MD  Povidone, PF, (IVIZIA DRY EYES) 0.5 % SOLN Place 1 drop into both eyes in the morning and at bedtime.    [provider]  sacubitril-valsartan (ENTRESTO) 24-26 MG Take 1 tablet by mouth 2 (two) times daily. 02/13/22   Loel Dubonnet, NP  traZODone (DESYREL) 100 MG tablet Take 100 mg by mouth at bedtime. 06/03/18   [provider]  venlafaxine (EFFEXOR-XR) 75 MG 24 hr capsule Take 75 mg by mouth daily with breakfast.    [provider]  vitamin E 400 UNIT capsule Take 400 Units by mouth daily.    [provider]  XANAX 0.25 MG tablet Take 0.25 mg by mouth every 8 (eight) hours as needed. 12/06/20   [provider]      Allergies    Banana, Codeine, Dilaudid [hydromorphone], Other, Oxycodone hcl, Oxycodone hcl, Sausage [pickled meat], and Tobramycin    Review of Systems   Review of Systems  Cardiovascular:  Positive for chest pain.    Physical Exam Updated Vital Signs BP (!) 127/47   Pulse 66   Temp 98.1 F (36.7 C) (Oral)  Resp 14   Ht '5\' 3"'$  (1.6 m)   Wt 93 kg   SpO2 95%   BMI 36.31 kg/m  Physical Exam  ED Results / Procedures / Treatments   Labs (all labs ordered are listed, but only abnormal results are displayed) Labs Reviewed  COMPREHENSIVE METABOLIC PANEL - Abnormal; Notable for the following components:      Result Value   Glucose, Bld 183 (*)    BUN 42 (*)    Creatinine, Ser 1.19 (*)    GFR, Estimated 45 (*)    All other components within normal limits  D-DIMER, QUANTITATIVE - Abnormal; Notable for the following components:   D-Dimer, Quant 2.02 (*)    All other components within normal limits  RESP PANEL BY RT-PCR (RSV,  FLU A&B, COVID)  RVPGX2  CBC WITH DIFFERENTIAL/PLATELET  TROPONIN I (HIGH SENSITIVITY)  TROPONIN I (HIGH SENSITIVITY)    EKG EKG Interpretation  Date/Time:  Sunday April 21 2022 16:16:00 EST Ventricular Rate:  119 PR Interval:  151 QRS Duration: 80 QT Interval:  266 QTC Calculation: 375 R Axis:   64 Text Interpretation: Ectopic atrial tachycardia, unifocal Repol abnrm, severe global ischemia (LM/MVD) Confirmed by Milton Ferguson [phone]) on 04/21/2022 4:23:07 PM  Radiology CT Angio Chest PE W and/or Wo Contrast  Result Date: 04/21/2022 CLINICAL DATA:  Chest pain, elevated D-dimer, high clinical suspicion for PE EXAM: CT ANGIOGRAPHY CHEST WITH CONTRAST TECHNIQUE: Multidetector CT imaging of the chest was performed using the standard protocol during bolus administration of intravenous contrast. Multiplanar CT image reconstructions and MIPs were obtained to evaluate the vascular anatomy. RADIATION DOSE REDUCTION: This exam was performed according to the departmental dose-optimization program which includes automated exposure control, adjustment of the mA and/or kV according to patient size and/or use of iterative reconstruction technique. CONTRAST:  12m OMNIPAQUE IOHEXOL 350 MG/ML SOLN COMPARISON:  Chest radiograph done earlier today and previous CT done on 03/16/2015 FINDINGS: Cardiovascular: There is homogeneous enhancement in thoracic aorta. Calcifications are seen in thoracic aorta and its major branches. Coronary artery calcifications are seen. There are no intraluminal filling defects in central pulmonary artery branches. Evaluation of small peripheral branches is limited by motion artifacts. Mediastinum/Nodes: No significant lymphadenopathy seen. Lungs/Pleura: There is increase in interstitial markings in the subpleural location in the lateral aspect of right upper lobe. There are linear densities in both lower lung fields. There is no focal pulmonary consolidation. There is pleural thickening  in the left cardiophrenic angle. There is no significant pleural effusion. There is no pneumothorax. Upper Abdomen: Surgical clips are seen in gallbladder fossa. Calcifications are seen in visualized upper abdominal aorta and its major branches. Musculoskeletal: There is surgical fusion in cervical spine. Degenerative changes are noted in thoracic spine with large anterior bony spurs. Review of the MIP images confirms the above findings. IMPRESSION: There is no evidence of pulmonary artery embolism. There is no evidence of thoracic aortic dissection. There is increase in interstitial markings in the lateral aspect of right upper lobe and in both lower lung fields suggesting possible scarring and interstitial pneumonia. There is no focal pulmonary consolidation. There is no pleural effusion or pneumothorax. Coronary artery disease.  Aortic atherosclerosis. Other findings as described in the body of the report. Electronically Signed   By: PElmer PickerM.D.   On: 04/21/2022 17:21   DG Chest Port 1 View  Result Date: 04/21/2022 CLINICAL DATA:  Midsternal chest pain radiating into the neck and left arm. EXAM: PORTABLE CHEST 1 VIEW  COMPARISON:  Radiographs 11/22/2020 and 03/16/2015 CT. FINDINGS: 1630 hours. The heart size and mediastinal contours are stable with aortic atherosclerosis. There is new patchy airspace disease peripherally at the left lung base with a possible small left pleural effusion. The right lung appears clear. No edema or pneumothorax. There are degenerative changes in the thoracic spine associated with a convex right scoliosis. Previous cervical fusion. Telemetry leads overlie the chest. IMPRESSION: New patchy airspace disease peripherally at the left lung base with possible small left pleural effusion, potentially early pneumonia in the appropriate clinical context. Electronically Signed   By: Richardean Sale M.D.   On: 04/21/2022 16:53    Procedures Procedures  {Document cardiac  monitor, telemetry assessment procedure when appropriate:1}  Medications Ordered in ED Medications  nitroGLYCERIN (NITROSTAT) SL tablet 0.4 mg (0.4 mg Sublingual Given 04/21/22 1637)  sodium chloride 0.9 % bolus 500 mL (0 mLs Intravenous Stopped 04/21/22 1826)  morphine (PF) 2 MG/ML injection 2 mg (2 mg Intravenous Given 04/21/22 1711)  iohexol (OMNIPAQUE) 350 MG/ML injection 75 mL (75 mLs Intravenous Contrast Given 04/21/22 1700)  pantoprazole (PROTONIX) injection 40 mg (40 mg Intravenous Given 04/21/22 1826)    ED Course/ Medical Decision Making/ A&P  Patient with chest pain today at 230.  EKG showed inverted T waves that were new from a year and a half ago.  First troponin was negative.  Repeat EKG when she was not tachycardic looked normal.  I spoke with cardiology and they requested medicine admission with cardiology consult tomorrow and possible stress test                         Medical Decision Making Amount and/or Complexity of Data Reviewed Labs: ordered. Radiology: ordered. ECG/medicine tests: ordered.  Risk Prescription drug management. Decision regarding hospitalization.  Atypical chest pain with some EKG changes.  Patient will be admitted to medicine with cardiology consult  {Document critical care time when appropriate:1} {Document review of labs and clinical decision tools ie heart score, Chads2Vasc2 etc:1}  {Document your independent review of radiology images, and any outside records:1} {Document your discussion with family members, caretakers, and with consultants:1} {Document social determinants of health affecting pt's care:1} {Document your decision making why or why not admission, treatments were needed:1} Final Clinical Impression(s) / ED Diagnoses Final diagnoses:  Atypical chest pain    Rx / DC Orders ED Discharge Orders     None

## 2022-04-21 NOTE — Assessment & Plan Note (Addendum)
-   Continue current dosages of metoprolol and Norvasc -Heart healthy diet discussed with patient.

## 2022-04-21 NOTE — ED Triage Notes (Signed)
Patient brought in via EMS. Alert and oriented. Airway patent. Patient c/o chest pain that started prior to coming to ED. Patient reports mid-sternal chest pain that radiates into neck and down left arm. Per patient shortness of breath and weakness. Denies any nausea, vomiting, cough, fevers, or dizziness. Patient does have hx of CHF and reports swelling in lower extremities. Patient reports taking a total of 5 "baby aspirin today." Paramedic reports some ST depression on EKG.

## 2022-04-21 NOTE — Assessment & Plan Note (Signed)
-   Chest pain with radiation to left arm and neck -Troponin negative and no acute ischemic changes appreciated on telemetry or EKG. -Given risk factors and ongoing chest pain after discussing with cardiology service decision has been made to transfer to [facility] for heart cath. -Patient will remain n.p.o.; continue holding Entresto and SGLT2 inhibitors. -Follow further recommendations by cardiology service after cath evaluation.

## 2022-04-21 NOTE — Assessment & Plan Note (Signed)
Continue statin. 

## 2022-04-22 ENCOUNTER — Encounter (HOSPITAL_BASED_OUTPATIENT_CLINIC_OR_DEPARTMENT_OTHER): Payer: Self-pay

## 2022-04-22 ENCOUNTER — Ambulatory Visit (HOSPITAL_BASED_OUTPATIENT_CLINIC_OR_DEPARTMENT_OTHER): Payer: Medicare Other | Admitting: Pulmonary Disease

## 2022-04-22 DIAGNOSIS — R7989 Other specified abnormal findings of blood chemistry: Secondary | ICD-10-CM | POA: Diagnosis not present

## 2022-04-22 DIAGNOSIS — I10 Essential (primary) hypertension: Secondary | ICD-10-CM

## 2022-04-22 DIAGNOSIS — I5032 Chronic diastolic (congestive) heart failure: Secondary | ICD-10-CM | POA: Diagnosis not present

## 2022-04-22 DIAGNOSIS — R079 Chest pain, unspecified: Secondary | ICD-10-CM

## 2022-04-22 DIAGNOSIS — R008 Other abnormalities of heart beat: Secondary | ICD-10-CM | POA: Diagnosis not present

## 2022-04-22 DIAGNOSIS — E782 Mixed hyperlipidemia: Secondary | ICD-10-CM

## 2022-04-22 MED ORDER — AMIODARONE HCL 200 MG PO TABS
200.0000 mg | ORAL_TABLET | Freq: Two times a day (BID) | ORAL | Status: DC
  Administered 2022-04-22 – 2022-04-23 (×3): 200 mg via ORAL
  Filled 2022-04-22 (×4): qty 1

## 2022-04-22 MED ORDER — METOPROLOL TARTRATE 12.5 MG HALF TABLET
12.5000 mg | ORAL_TABLET | Freq: Two times a day (BID) | ORAL | Status: DC
  Administered 2022-04-23 – 2022-04-24 (×2): 12.5 mg via ORAL
  Filled 2022-04-22 (×4): qty 1

## 2022-04-22 MED ORDER — PANTOPRAZOLE SODIUM 40 MG PO TBEC
40.0000 mg | DELAYED_RELEASE_TABLET | Freq: Two times a day (BID) | ORAL | Status: DC
  Administered 2022-04-22 – 2022-04-24 (×5): 40 mg via ORAL
  Filled 2022-04-22 (×5): qty 1

## 2022-04-22 MED ORDER — EMPAGLIFLOZIN 10 MG PO TABS
10.0000 mg | ORAL_TABLET | Freq: Every day | ORAL | Status: DC
  Administered 2022-04-22: 10 mg via ORAL
  Filled 2022-04-22 (×2): qty 1

## 2022-04-22 NOTE — Progress Notes (Signed)
Patient seen and examined; admitted after midnight secondary to chest pain; patient reports symptoms present sudden, substernally located and radiated to the left arm.  Patient expressed associated generalized weakness and some palpitations.  There was no nausea, no vomiting, no diaphoresis, no shortness of breath or lightheadedness.  Blood pressure overall stable; troponin negative and no acute ischemic changes appreciated on EKG.  Cardiology service has been consulted and at this moment plan is for [facility] on 04/23/2022.    Patient's telemetry has demonstrated some ongoing PVCs, atrial tachycardia and episodes at rest of sinus bradycardia with bigeminy while using metoprolol.  Plan is to adjust metoprolol dose, initiate treatment with amiodarone and arrange for outpatient [facility] to monitor further.  Please refer to H&P written by Dr. Clearence Ped on 04/22/22 for further info/details on admission.  Plan: -Continue adjusted dose of PPI -Continue as needed analgesics -Continue current dose of Lasix, statin, metoprolol and Entresto -Continue telemetry monitoring -Planning for [facility] [facility] on 04/23/2022. -Continue to follow recommendations by cardiology service. -N.p.o. after midnight.   Barton Dubois MD [phone]

## 2022-04-22 NOTE — Care Management Obs Status (Signed)
[place] NOTIFICATION   Patient Details  Name: Leslie Hendricks MRN: [id number] Date of Birth: July 31, 1940   Medicare Observation Status Notification Given:  Yes    Tommy Medal 04/22/2022, 4:17 PM
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
## 2022-04-22 NOTE — TOC Progression Note (Signed)
  Transition of Care (TOC) Screening Note   Patient Details  Name: Leslie Hendricks Date of Birth: March 25, 1941   Transition of Care [facility]) CM/SW Contact:    [name], RN Phone Number: 04/22/2022, 9:45 AM    Transition of Care Department [facility]) has reviewed patient and no TOC needs have been identified at this time. We will continue to monitor patient advancement through interdisciplinary progression rounds. If new patient transition needs arise, please place a TOC consult.    Expected Discharge Plan: Home/Self Care Barriers to Discharge: Continued Medical Work up
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
## 2022-04-23 ENCOUNTER — Encounter (HOSPITAL_COMMUNITY)
Admission: EM | Disposition: A | Discharge status: Home / Self Care | Payer: Self-pay | Source: Home / Self Care | Attending: Internal Medicine

## 2022-04-23 DIAGNOSIS — E669 Obesity, unspecified: Secondary | ICD-10-CM

## 2022-04-23 DIAGNOSIS — I5032 Chronic diastolic (congestive) heart failure: Secondary | ICD-10-CM | POA: Diagnosis not present

## 2022-04-23 DIAGNOSIS — R079 Chest pain, unspecified: Secondary | ICD-10-CM | POA: Diagnosis not present

## 2022-04-23 DIAGNOSIS — R7989 Other specified abnormal findings of blood chemistry: Secondary | ICD-10-CM | POA: Diagnosis not present

## 2022-04-23 DIAGNOSIS — I200 Unstable angina: Secondary | ICD-10-CM | POA: Diagnosis not present

## 2022-04-23 DIAGNOSIS — I2510 Atherosclerotic heart disease of native coronary artery without angina pectoris: Secondary | ICD-10-CM

## 2022-04-23 DIAGNOSIS — K219 Gastro-esophageal reflux disease without esophagitis: Secondary | ICD-10-CM

## 2022-04-23 DIAGNOSIS — I10 Essential (primary) hypertension: Secondary | ICD-10-CM | POA: Diagnosis not present

## 2022-04-23 HISTORY — PX: LEFT HEART CATH AND CORONARY ANGIOGRAPHY: CATH118249

## 2022-04-23 LAB — CBC
HCT: 38.7 % (ref 36.0–46.0)
HCT: 38.6 % (ref 36.0–46.0)
Hemoglobin: 12.3 g/dL (ref 12.0–15.0)
Hemoglobin: 12.5 g/dL (ref 12.0–15.0)
MCH: 31.8 pg (ref 26.0–34.0)
MCH: 31.9 pg (ref 26.0–34.0)
MCHC: 31.8 g/dL (ref 30.0–36.0)
MCHC: 32.4 g/dL (ref 30.0–36.0)
MCV: 100 fL (ref 80.0–100.0)
MCV: 98.5 fL (ref 80.0–100.0)
Platelets: 126 10*3/uL — ABNORMAL LOW (ref 150–400)
Platelets: 153 10*3/uL (ref 150–400)
RBC: 3.87 MIL/uL (ref 3.87–5.11)
RBC: 3.92 MIL/uL (ref 3.87–5.11)
RDW: 14 % (ref 11.5–15.5)
RDW: 13.8 % (ref 11.5–15.5)
WBC: 5.7 10*3/uL (ref 4.0–10.5)
WBC: 6.1 10*3/uL (ref 4.0–10.5)
nRBC: 0 % (ref 0.0–0.2)
nRBC: 0 % (ref 0.0–0.2)

## 2022-04-23 LAB — COMPREHENSIVE METABOLIC PANEL
ALT: 22 U/L (ref 0–44)
ALT: 23 U/L (ref 0–44)
AST: 24 U/L (ref 15–41)
AST: 23 U/L (ref 15–41)
Albumin: 3.2 g/dL — ABNORMAL LOW (ref 3.5–5.0)
Albumin: 2.9 g/dL — ABNORMAL LOW (ref 3.5–5.0)
Alkaline Phosphatase: 73 U/L (ref 38–126)
Alkaline Phosphatase: 73 U/L (ref 38–126)
Anion gap: 10 (ref 5–15)
Anion gap: 6 (ref 5–15)
BUN: 27 mg/dL — ABNORMAL HIGH (ref 8–23)
BUN: 23 mg/dL (ref 8–23)
CO2: 22 mmol/L (ref 22–32)
CO2: 28 mmol/L (ref 22–32)
Calcium: 8.7 mg/dL — ABNORMAL LOW (ref 8.9–10.3)
Calcium: 8.7 mg/dL — ABNORMAL LOW (ref 8.9–10.3)
Chloride: 107 mmol/L (ref 98–111)
Chloride: 106 mmol/L (ref 98–111)
Creatinine, Ser: 1.21 mg/dL — ABNORMAL HIGH (ref 0.44–1.00)
Creatinine, Ser: 1.24 mg/dL — ABNORMAL HIGH (ref 0.44–1.00)
GFR, Estimated: 44 mL/min — ABNORMAL LOW (ref >60)
GFR, Estimated: 43 mL/min — ABNORMAL LOW (ref >60)
Glucose, Bld: 95 mg/dL (ref 70–99)
Glucose, Bld: 109 mg/dL — ABNORMAL HIGH (ref 70–99)
Potassium: 4.2 mmol/L (ref 3.5–5.1)
Potassium: 3.9 mmol/L (ref 3.5–5.1)
Sodium: 139 mmol/L (ref 135–145)
Sodium: 140 mmol/L (ref 135–145)
Total Bilirubin: 0.9 mg/dL (ref 0.3–1.2)
Total Bilirubin: 0.9 mg/dL (ref 0.3–1.2)
Total Protein: 5.7 g/dL — ABNORMAL LOW (ref 6.5–8.1)
Total Protein: 5.6 g/dL — ABNORMAL LOW (ref 6.5–8.1)

## 2022-04-23 LAB — GLUCOSE, CAPILLARY
Glucose-Capillary: 99 mg/dL (ref 70–99)
Glucose-Capillary: 188 mg/dL — ABNORMAL HIGH (ref 70–99)

## 2022-04-23 SURGERY — LEFT HEART CATH AND CORONARY ANGIOGRAPHY
Anesthesia: LOCAL

## 2022-04-23 MED ORDER — HEPARIN SODIUM (PORCINE) 1000 UNIT/ML IJ SOLN
INTRAMUSCULAR | Status: AC
  Filled 2022-04-23: qty 10

## 2022-04-23 MED ORDER — ONDANSETRON HCL 4 MG/2ML IJ SOLN: 4.0000 mg | Freq: Four times a day (QID) | INTRAMUSCULAR | Status: DC | PRN

## 2022-04-23 MED ORDER — SODIUM CHLORIDE 0.9 % WEIGHT BASED INFUSION: 3.0000 mL/kg/h | INTRAVENOUS | Status: DC

## 2022-04-23 MED ORDER — LABETALOL HCL 5 MG/ML IV SOLN: 10.0000 mg | INTRAVENOUS | Status: AC | PRN

## 2022-04-23 MED ORDER — SODIUM CHLORIDE 0.9% FLUSH
3.0000 mL | Freq: Two times a day (BID) | INTRAVENOUS | Status: DC
  Administered 2022-04-23 – 2022-04-24 (×2): 3 mL via INTRAVENOUS

## 2022-04-23 MED ORDER — SODIUM CHLORIDE 0.9% FLUSH
3.0000 mL | Freq: Two times a day (BID) | INTRAVENOUS | Status: DC
  Administered 2022-04-24: 3 mL via INTRAVENOUS

## 2022-04-23 MED ORDER — LIDOCAINE HCL (PF) 1 % IJ SOLN
INTRAMUSCULAR | Status: DC | PRN
  Administered 2022-04-23: 2 mL

## 2022-04-23 MED ORDER — FENTANYL CITRATE (PF) 100 MCG/2ML IJ SOLN
INTRAMUSCULAR | Status: AC
  Filled 2022-04-23: qty 2

## 2022-04-23 MED ORDER — SODIUM CHLORIDE 0.9 % IV SOLN: 250.0000 mL | INTRAVENOUS | Status: DC | PRN

## 2022-04-23 MED ORDER — SODIUM CHLORIDE 0.9% FLUSH: 3.0000 mL | INTRAVENOUS | Status: DC | PRN

## 2022-04-23 MED ORDER — SODIUM CHLORIDE 0.9 % WEIGHT BASED INFUSION: 1.0000 mL/kg/h | INTRAVENOUS | Status: DC

## 2022-04-23 MED ORDER — HEPARIN SODIUM (PORCINE) 1000 UNIT/ML IJ SOLN
INTRAMUSCULAR | Status: DC | PRN
  Administered 2022-04-23: 5000 [IU] via INTRAVENOUS

## 2022-04-23 MED ORDER — FENTANYL CITRATE (PF) 100 MCG/2ML IJ SOLN
INTRAMUSCULAR | Status: DC | PRN
  Administered 2022-04-23: 25 ug via INTRAVENOUS

## 2022-04-23 MED ORDER — SODIUM CHLORIDE 0.9 % IV SOLN
INTRAVENOUS | Status: AC
  Administered 2022-04-23: 75 mL/h via INTRAVENOUS

## 2022-04-23 MED ORDER — VERAPAMIL HCL 2.5 MG/ML IV SOLN
INTRAVENOUS | Status: AC
  Filled 2022-04-23: qty 2

## 2022-04-23 MED ORDER — HYDRALAZINE HCL 20 MG/ML IJ SOLN: 10.0000 mg | INTRAMUSCULAR | Status: AC | PRN

## 2022-04-23 MED ORDER — ASPIRIN 81 MG PO CHEW: 81.0000 mg | CHEWABLE_TABLET | ORAL | Status: DC

## 2022-04-23 MED ORDER — MIDAZOLAM HCL 2 MG/2ML IJ SOLN
INTRAMUSCULAR | Status: DC | PRN
  Administered 2022-04-23: 1 mg via INTRAVENOUS

## 2022-04-23 MED ORDER — HEPARIN (PORCINE) IN NACL 1000-0.9 UT/500ML-% IV SOLN
INTRAVENOUS | Status: AC
  Filled 2022-04-23: qty 1000

## 2022-04-23 MED ORDER — LIDOCAINE HCL (PF) 1 % IJ SOLN
INTRAMUSCULAR | Status: AC
  Filled 2022-04-23: qty 30

## 2022-04-23 MED ORDER — ACETAMINOPHEN 325 MG PO TABS: 650.0000 mg | ORAL_TABLET | ORAL | Status: DC | PRN

## 2022-04-23 MED ORDER — HEPARIN (PORCINE) IN NACL 1000-0.9 UT/500ML-% IV SOLN
INTRAVENOUS | Status: DC | PRN
  Administered 2022-04-23 (×2): 500 mL

## 2022-04-23 MED ORDER — VERAPAMIL HCL 2.5 MG/ML IV SOLN
INTRAVENOUS | Status: DC | PRN
  Administered 2022-04-23 (×2): 10 mL via INTRA_ARTERIAL

## 2022-04-23 MED ORDER — IOHEXOL 350 MG/ML SOLN
INTRAVENOUS | Status: DC | PRN
  Administered 2022-04-23: 45 mL

## 2022-04-23 MED ORDER — MIDAZOLAM HCL 2 MG/2ML IJ SOLN
INTRAMUSCULAR | Status: AC
  Filled 2022-04-23: qty 2

## 2022-04-23 SURGICAL SUPPLY — 13 items
CATH 5FR JL3.5 JR4 ANG PIG MP (CATHETERS) IMPLANT
DEVICE RAD COMP TR BAND LRG (VASCULAR PRODUCTS) IMPLANT
ELECT DEFIB PAD ADLT CADENCE (PAD) IMPLANT
GLIDESHEATH SLEND SS 6F .021 (SHEATH) IMPLANT
GUIDEWIRE INQWIRE 1.5J.035X260 (WIRE) IMPLANT
INQWIRE 1.5J .035X260CM (WIRE) ×1
KIT HEART LEFT (KITS) ×1 IMPLANT
PACK CARDIAC CATHETERIZATION (CUSTOM PROCEDURE TRAY) ×1 IMPLANT
PROTECTION STATION PRESSURIZED (MISCELLANEOUS) ×1
SHEATH PROBE COVER 6X72 (BAG) IMPLANT
STATION PROTECTION PRESSURIZED (MISCELLANEOUS) IMPLANT
TRANSDUCER W/STOPCOCK (MISCELLANEOUS) ×1 IMPLANT
TUBING CIL FLEX 10 FLL-RA (TUBING) ×1 IMPLANT

## 2022-04-23 NOTE — Plan of Care (Signed)
  Problem: Education: Goal: Knowledge of General Education information will improve Description: Including pain rating scale, medication(s)/side effects and non-pharmacologic comfort measures Outcome: Progressing   Problem: Health Behavior/Discharge Planning: Goal: Ability to manage health-related needs will improve Outcome: Progressing   Problem: Clinical Measurements: Goal: Respiratory complications will improve Outcome: Progressing   Problem: Clinical Measurements: Goal: Cardiovascular complication will be avoided Outcome: Progressing   Problem: Pain Managment: Goal: General experience of comfort will improve Outcome: Progressing   Problem: Safety: Goal: Ability to remain free from injury will improve Outcome: Progressing   Problem: Skin Integrity: Goal: Risk for impaired skin integrity will decrease Outcome: Progressing   Problem: Cardiovascular: Goal: Ability to achieve and maintain adequate cardiovascular perfusion will improve Outcome: Progressing Goal: Vascular access site(s) Level 0-1 will be maintained Outcome: Progressing

## 2022-04-23 NOTE — Progress Notes (Signed)
Progress Note  Patient Name: Leslie Hendricks Date of Encounter: 04/23/2022  Primary Cardiologist: Quay Burow, MD  Subjective   No acute events overnight however patient continued to have substernal chest pain that started yesterday morning and never resolved. During interview, she continues to have lingering chest pain.  Inpatient Medications    Scheduled Meds:  amiodarone  200 mg Oral BID   amLODipine  2.5 mg Oral Daily   aspirin  81 mg Oral Pre-Cath   atorvastatin  80 mg Oral QHS   heparin  5,000 Units Subcutaneous Q8H   metoprolol tartrate  12.5 mg Oral BID   pantoprazole  40 mg Oral BID   polyethylene glycol  17 g Oral Daily   sodium chloride flush  3 mL Intravenous Q12H   traZODone  100 mg Oral QHS   venlafaxine XR  75 mg Oral Q breakfast   Continuous Infusions:  sodium chloride     sodium chloride     Followed by   sodium chloride     PRN Meds: sodium chloride, acetaminophen, ALPRAZolam, ondansetron (ZOFRAN) IV, sodium chloride flush   Vital Signs    Vitals:   04/22/22 1038 04/22/22 1800 04/22/22 2038 04/23/22 0454  BP: 120/65 (!) 131/58 (!) 137/51 (!) 142/48  Pulse: (!) 53 (!) 53 (!) 44 (!) 55  Resp:  '16 18 18  '$ Temp:  97.8 F (36.6 C) (!) 97.1 F (36.2 C) (!) 96.9 F (36.1 C)  TempSrc:  Oral    SpO2:  99% 98% 93%  Weight:      Height:        Intake/Output Summary (Last 24 hours) at 04/23/2022 1134 Last data filed at 04/23/2022 0900 Gross per 24 hour  Intake 240 ml  Output 2700 ml  Net -2460 ml   Filed Weights   04/21/22 1559 04/21/22 2210 04/22/22 0134  Weight: 93 kg 95.8 kg 95.6 kg    Telemetry    Personally reviewed, sinus bradycardia and ventricular bigeminy  ECG    Normal sinus rhythm and no ST-T changes  Physical Exam   GEN: No acute distress.   Neck: No JVD. Cardiac: RRR, no murmur, rub, or gallop.  Respiratory: Nonlabored. Clear to auscultation bilaterally. GI: Soft, nontender, bowel sounds present. MS: No edema; No  deformity. Neuro:  Nonfocal. Psych: Alert and oriented x 3. Normal affect.  Labs    Chemistry Recent Labs  Lab 04/21/22 1605  NA 136  K 3.5  CL 98  CO2 27  GLUCOSE 183*  BUN 42*  CREATININE 1.19*  CALCIUM 9.3  PROT 6.7  ALBUMIN 3.7  AST 29  ALT 29  ALKPHOS 83  BILITOT 0.8  GFRNONAA 45*  ANIONGAP 11     Hematology Recent Labs  Lab 04/21/22 1605  WBC 5.8  RBC 4.07  HGB 12.9  HCT 40.2  MCV 98.8  MCH 31.7  MCHC 32.1  RDW 13.9  PLT 173    Cardiac Enzymes Recent Labs  Lab 04/21/22 1605 04/21/22 1815  TROPONINIHS 6 10    BNPNo results for input(s): "BNP", "PROBNP" in the last 168 hours.   DDimer Recent Labs  Lab 04/21/22 1605  DDIMER 2.02*     Radiology    CT Angio Chest PE W and/or Wo Contrast  Result Date: 04/21/2022 CLINICAL DATA:  Chest pain, elevated D-dimer, high clinical suspicion for PE EXAM: CT ANGIOGRAPHY CHEST WITH CONTRAST TECHNIQUE: Multidetector CT imaging of the chest was performed using the standard protocol during bolus administration  of intravenous contrast. Multiplanar CT image reconstructions and MIPs were obtained to evaluate the vascular anatomy. RADIATION DOSE REDUCTION: This exam was performed according to the departmental dose-optimization program which includes automated exposure control, adjustment of the mA and/or kV according to patient size and/or use of iterative reconstruction technique. CONTRAST:  63m OMNIPAQUE IOHEXOL 350 MG/ML SOLN COMPARISON:  Chest radiograph done earlier today and previous CT done on 03/16/2015 FINDINGS: Cardiovascular: There is homogeneous enhancement in thoracic aorta. Calcifications are seen in thoracic aorta and its major branches. Coronary artery calcifications are seen. There are no intraluminal filling defects in central pulmonary artery branches. Evaluation of small peripheral branches is limited by motion artifacts. Mediastinum/Nodes: No significant lymphadenopathy seen. Lungs/Pleura: There is  increase in interstitial markings in the subpleural location in the lateral aspect of right upper lobe. There are linear densities in both lower lung fields. There is no focal pulmonary consolidation. There is pleural thickening in the left cardiophrenic angle. There is no significant pleural effusion. There is no pneumothorax. Upper Abdomen: Surgical clips are seen in gallbladder fossa. Calcifications are seen in visualized upper abdominal aorta and its major branches. Musculoskeletal: There is surgical fusion in cervical spine. Degenerative changes are noted in thoracic spine with large anterior bony spurs. Review of the MIP images confirms the above findings. IMPRESSION: There is no evidence of pulmonary artery embolism. There is no evidence of thoracic aortic dissection. There is increase in interstitial markings in the lateral aspect of right upper lobe and in both lower lung fields suggesting possible scarring and interstitial pneumonia. There is no focal pulmonary consolidation. There is no pleural effusion or pneumothorax. Coronary artery disease.  Aortic atherosclerosis. Other findings as described in the body of the report. Electronically Signed   By: PElmer PickerM.D.   On: 04/21/2022 17:21   DG Chest Port 1 View  Result Date: 04/21/2022 CLINICAL DATA:  Midsternal chest pain radiating into the neck and left arm. EXAM: PORTABLE CHEST 1 VIEW COMPARISON:  Radiographs 11/22/2020 and 03/16/2015 CT. FINDINGS: 1630 hours. The heart size and mediastinal contours are stable with aortic atherosclerosis. There is new patchy airspace disease peripherally at the left lung base with a possible small left pleural effusion. The right lung appears clear. No edema or pneumothorax. There are degenerative changes in the thoracic spine associated with a convex right scoliosis. Previous cervical fusion. Telemetry leads overlie the chest. IMPRESSION: New patchy airspace disease peripherally at the left lung base with  possible small left pleural effusion, potentially early pneumonia in the appropriate clinical context. Electronically Signed   By: WRichardean SaleM.D.   On: 04/21/2022 16:53    Patient profile   Patient is 82year old F known to have HTN, DM 2, HLD, carotid artery stenosis, CKD stage III, nicotine abuse 3, frequent falls, COVID-19 infection in 06/2020, family history of CAD presented to the hospital with chest pain. EKG showed normal sinus rhythm and no ST changes. High-sensitivity troponins were within normal limits and negative. Telemetry reviewed and showed ventricular bigeminy, frequent. Patient has been having DOE since COVID infection and never resolved.   Assessment & Plan    # Possibly cardiac chest pain/unstable angina: Initially chest pain occurred on 04/21/2022 and resolved after coming to the ER.  However she had recurrent chest pain on 04/22/2022 and never resolved. Patient has persistent chest pain with no EKG changes and negative troponins. Telemetry showed frequent ventricular bigeminy and sinus bradycardia. She has DOE since 2022 and never resolved. Echo showed normal  LVEF, G2 DD and mild to moderate TR. HFpEF could be an anginal equivalent in elderly females and will need to rule out CAD due to current symptoms, frequent ventricular bigeminy and risk factors like DM 2. Nuclear stress test was ordered for today but due to persistent chest pain, nuclear stress test was canceled. She will benefit from transfer to [facility]. Risks and benefits of cardiac catheterization have been discussed with the patient.  These include bleeding, infection, kidney damage, stroke, heart attack, death.  The patient understands these risks and is willing to proceed. -Hold Entresto and SGLT2 inhibitors prior to [facility]. Start IV fluids due to history of CKD in preparation for LHC.  I have spent a total of 45 minutes with patient reviewing chart , telemetry, EKGs, labs and examining patient as  well as establishing an assessment and plan that was discussed with the patient.  > 50% of time was spent in direct patient care.    Signed, Chalmers Guest, MD  04/23/2022, 11:34 AM

## 2022-04-23 NOTE — Interval H&P Note (Signed)
Cath Lab Visit (complete for each Cath Lab visit)  Clinical Evaluation Leading to the Procedure:   ACS: Yes.    Non-ACS:    Anginal Classification: CCS IV  Anti-ischemic medical therapy: Minimal Therapy (1 class of medications)  Non-Invasive Test Results: No non-invasive testing performed  Prior CABG: No previous CABG      Cath Lab Visit (complete for each Cath Lab visit)  Clinical Evaluation Leading to the Procedure:   ACS: Yes.    Non-ACS:    Anginal Classification: CCS IV  Anti-ischemic medical therapy: Minimal Therapy (1 class of medications)  Non-Invasive Test Results: No non-invasive testing performed  Prior CABG: No previous CABG      History and Physical Interval Note:  04/23/2022 3:43 PM  Leslie Hendricks  has presented today for surgery, with the diagnosis of chest pain.  The various methods of treatment have been discussed with the patient and family. After consideration of risks, benefits and other options for treatment, the patient has consented to  Procedure(s): LEFT HEART CATH AND CORONARY ANGIOGRAPHY (N/A) as a surgical intervention.  The patient's history has been reviewed, patient examined, no change in status, stable for surgery.  I have reviewed the patient's chart and labs.  Questions were answered to the patient's satisfaction.     Larae Grooms

## 2022-04-23 NOTE — Progress Notes (Addendum)
Dr. Marsa Aris note to follow, but she saw pt this AM and recommended LHC instead of stress test. I called nuc med to cancel stress. I called [facility] cath lab and she is on their add on board. They will call for her when they are ready. Dr. Dellia Cloud recommends transfer to [facility] on medicine service so I notified Dr. Dyann Kief and Wannetta Sender, with cardiology to follow when at [facility]. Pre-cath orders written with standard fluids per Dr. Marsa Aris request; she also advised to hold Jardiance/Entresto pre-cath so cancelled the order. Please review plan post-cath. She consented pt for procedure. Also sent update to RN listed.

## 2022-04-23 NOTE — H&P (View-Only) (Signed)
Progress Note  Patient Name: Leslie Hendricks Date of Encounter: 04/23/2022  Primary Cardiologist: Quay Burow, MD  Subjective   No acute events overnight however patient continued to have substernal chest pain that started yesterday morning and never resolved. During interview, she continues to have lingering chest pain.  Inpatient Medications    Scheduled Meds:  amiodarone  200 mg Oral BID   amLODipine  2.5 mg Oral Daily   aspirin  81 mg Oral Pre-Cath   atorvastatin  80 mg Oral QHS   heparin  5,000 Units Subcutaneous Q8H   metoprolol tartrate  12.5 mg Oral BID   pantoprazole  40 mg Oral BID   polyethylene glycol  17 g Oral Daily   sodium chloride flush  3 mL Intravenous Q12H   traZODone  100 mg Oral QHS   venlafaxine XR  75 mg Oral Q breakfast   Continuous Infusions:  sodium chloride     sodium chloride     Followed by   sodium chloride     PRN Meds: sodium chloride, acetaminophen, ALPRAZolam, ondansetron (ZOFRAN) IV, sodium chloride flush   Vital Signs    Vitals:   04/22/22 1038 04/22/22 1800 04/22/22 2038 04/23/22 0454  BP: 120/65 (!) 131/58 (!) 137/51 (!) 142/48  Pulse: (!) 53 (!) 53 (!) 44 (!) 55  Resp:  '16 18 18  '$ Temp:  97.8 F (36.6 C) (!) 97.1 F (36.2 C) (!) 96.9 F (36.1 C)  TempSrc:  Oral    SpO2:  99% 98% 93%  Weight:      Height:        Intake/Output Summary (Last 24 hours) at 04/23/2022 1134 Last data filed at 04/23/2022 0900 Gross per 24 hour  Intake 240 ml  Output 2700 ml  Net -2460 ml   Filed Weights   04/21/22 1559 04/21/22 2210 04/22/22 0134  Weight: 93 kg 95.8 kg 95.6 kg    Telemetry    Personally reviewed, sinus bradycardia and ventricular bigeminy  ECG    Normal sinus rhythm and no ST-T changes  Physical Exam   GEN: No acute distress.   Neck: No JVD. Cardiac: RRR, no murmur, rub, or gallop.  Respiratory: Nonlabored. Clear to auscultation bilaterally. GI: Soft, nontender, bowel sounds present. MS: No edema; No  deformity. Neuro:  Nonfocal. Psych: Alert and oriented x 3. Normal affect.  Labs    Chemistry Recent Labs  Lab 04/21/22 1605  NA 136  K 3.5  CL 98  CO2 27  GLUCOSE 183*  BUN 42*  CREATININE 1.19*  CALCIUM 9.3  PROT 6.7  ALBUMIN 3.7  AST 29  ALT 29  ALKPHOS 83  BILITOT 0.8  GFRNONAA 45*  ANIONGAP 11     Hematology Recent Labs  Lab 04/21/22 1605  WBC 5.8  RBC 4.07  HGB 12.9  HCT 40.2  MCV 98.8  MCH 31.7  MCHC 32.1  RDW 13.9  PLT 173    Cardiac Enzymes Recent Labs  Lab 04/21/22 1605 04/21/22 1815  TROPONINIHS 6 10    BNPNo results for input(s): "BNP", "PROBNP" in the last 168 hours.   DDimer Recent Labs  Lab 04/21/22 1605  DDIMER 2.02*     Radiology    CT Angio Chest PE W and/or Wo Contrast  Result Date: 04/21/2022 CLINICAL DATA:  Chest pain, elevated D-dimer, high clinical suspicion for PE EXAM: CT ANGIOGRAPHY CHEST WITH CONTRAST TECHNIQUE: Multidetector CT imaging of the chest was performed using the standard protocol during bolus administration  of intravenous contrast. Multiplanar CT image reconstructions and MIPs were obtained to evaluate the vascular anatomy. RADIATION DOSE REDUCTION: This exam was performed according to the departmental dose-optimization program which includes automated exposure control, adjustment of the mA and/or kV according to patient size and/or use of iterative reconstruction technique. CONTRAST:  68m OMNIPAQUE IOHEXOL 350 MG/ML SOLN COMPARISON:  Chest radiograph done earlier today and previous CT done on 03/16/2015 FINDINGS: Cardiovascular: There is homogeneous enhancement in thoracic aorta. Calcifications are seen in thoracic aorta and its major branches. Coronary artery calcifications are seen. There are no intraluminal filling defects in central pulmonary artery branches. Evaluation of small peripheral branches is limited by motion artifacts. Mediastinum/Nodes: No significant lymphadenopathy seen. Lungs/Pleura: There is  increase in interstitial markings in the subpleural location in the lateral aspect of right upper lobe. There are linear densities in both lower lung fields. There is no focal pulmonary consolidation. There is pleural thickening in the left cardiophrenic angle. There is no significant pleural effusion. There is no pneumothorax. Upper Abdomen: Surgical clips are seen in gallbladder fossa. Calcifications are seen in visualized upper abdominal aorta and its major branches. Musculoskeletal: There is surgical fusion in cervical spine. Degenerative changes are noted in thoracic spine with large anterior bony spurs. Review of the MIP images confirms the above findings. IMPRESSION: There is no evidence of pulmonary artery embolism. There is no evidence of thoracic aortic dissection. There is increase in interstitial markings in the lateral aspect of right upper lobe and in both lower lung fields suggesting possible scarring and interstitial pneumonia. There is no focal pulmonary consolidation. There is no pleural effusion or pneumothorax. Coronary artery disease.  Aortic atherosclerosis. Other findings as described in the body of the report. Electronically Signed   By: PElmer PickerM.D.   On: 04/21/2022 17:21   DG Chest Port 1 View  Result Date: 04/21/2022 CLINICAL DATA:  Midsternal chest pain radiating into the neck and left arm. EXAM: PORTABLE CHEST 1 VIEW COMPARISON:  Radiographs 11/22/2020 and 03/16/2015 CT. FINDINGS: 1630 hours. The heart size and mediastinal contours are stable with aortic atherosclerosis. There is new patchy airspace disease peripherally at the left lung base with a possible small left pleural effusion. The right lung appears clear. No edema or pneumothorax. There are degenerative changes in the thoracic spine associated with a convex right scoliosis. Previous cervical fusion. Telemetry leads overlie the chest. IMPRESSION: New patchy airspace disease peripherally at the left lung base with  possible small left pleural effusion, potentially early pneumonia in the appropriate clinical context. Electronically Signed   By: WRichardean SaleM.D.   On: 04/21/2022 16:53    Patient profile   Patient is 82year old F known to have HTN, DM 2, HLD, carotid artery stenosis, CKD stage III, nicotine abuse 3, frequent falls, COVID-19 infection in 06/2020, family history of CAD presented to the hospital with chest pain. EKG showed normal sinus rhythm and no ST changes. High-sensitivity troponins were within normal limits and negative. Telemetry reviewed and showed ventricular bigeminy, frequent. Patient has been having DOE since COVID infection and never resolved.   Assessment & Plan    # Possibly cardiac chest pain/unstable angina: Initially chest pain occurred on 04/21/2022 and resolved after coming to the ER.  However she had recurrent chest pain on 04/22/2022 and never resolved. Patient has persistent chest pain with no EKG changes and negative troponins. Telemetry showed frequent ventricular bigeminy and sinus bradycardia. She has DOE since 2022 and never resolved. Echo showed normal  LVEF, G2 DD and mild to moderate TR. HFpEF could be an anginal equivalent in elderly females and will need to rule out CAD due to current symptoms, frequent ventricular bigeminy and risk factors like DM 2. Nuclear stress test was ordered for today but due to persistent chest pain, nuclear stress test was canceled. She will benefit from transfer to [facility] for [facility] today. Risks and benefits of cardiac catheterization have been discussed with the patient.  These include bleeding, infection, kidney damage, stroke, heart attack, death.  The patient understands these risks and is willing to proceed. -Hold Entresto and SGLT2 inhibitors prior to [facility]. Start IV fluids due to history of CKD in preparation for LHC.  I have spent a total of 45 minutes with patient reviewing chart , telemetry, EKGs, labs and examining patient as  well as establishing an assessment and plan that was discussed with the patient.  > 50% of time was spent in direct patient care.    Signed, Chalmers Guest, MD  04/23/2022, 11:34 AM

## 2022-04-23 NOTE — Progress Notes (Signed)
Progress Note   Patient: Leslie Hendricks KNL:[id number] DOB: 1941/03/11 DOA: 04/21/2022     0 DOS: the patient was seen and examined on 04/23/2022   Brief hospital course: As per H&P written by Dr. Clearence Ped on 04/22/22 [name] is a 82 y.o. female with medical history significant of anxiety, cancer, depression, diabetes mellitus, GERD, hyperlipidemia, hypertension, macular degeneration, peripheral vascular disease, and more presents the ED with a chief complaint of chest pain.  Patient reports that she just got home and she stood up out of her car and she had sudden onset of severe chest pain.  She reports it was substernal chest pain that radiated down her left arm.  She describes it as sharp.  She had associated generalized weakness and palpitations.  She had no dyspnea, nausea, vomiting, diaphoresis, lightheaded/near syncopal feeling.  She was given nitro when she got to the ER and it did not help.  Morphine did not help.  Patient reports that although she has a history of anxiety, she did not feel anxious at that time.  Patient reports that she has been told she has calcifications in her heart and CHF, but she is never had an MI or stent placed.  Patient was otherwise in her normal state of health and had just been discharged.  She has not had any fever, cough, abdominal pain.  Patient has no other complaints at this time.   Patient does not smoke, does not drink.  She is vaccinated for flu and RSV.  Patient is DNR-reporting that she really would not want to be brought back.  Assessment and Plan: * Chest pain - Chest pain with radiation to left arm and neck -Troponin negative and no acute ischemic changes appreciated on telemetry or EKG. -Given risk factors and ongoing chest pain after discussing with cardiology service decision has been made to transfer to [facility] for heart cath. -Patient will remain n.p.o.; continue holding Entresto and SGLT2 inhibitors. -Follow further  recommendations by cardiology service after cath evaluation.   Chronic diastolic CHF (congestive heart failure) ([place]) - Last echo was in October 2023 and showed ejection fraction of 60-65% with grade 2 diastolic dysfunction - Appears to be currently compensated -Holding diuretics and Entresto in anticipation for cath evaluation -Continue to follow daily weights and strict I's and O's -Continue treatment with metoprolol. -Follow any further recommendation by cardiology service.    Elevated d-dimer - D-dimer elevated at 2.02 - CT angiogram done at time of admission demonstrated no pulmonary embolism. -Good oxygen saturation appreciated and patient expressed no shortness of breath symptoms.   Benign essential hypertension - Continue current dosages of metoprolol and Norvasc -Heart healthy diet discussed with patient.  Mixed hyperlipidemia -Continue statin  Class II obesity: -Body mass index is 37.33 kg/m. -Low calorie diet and portion control discussed with patient.  GERD -Continue PPI  Subjective:  No nausea or vomiting; still complaining of ongoing chest pain radiating to left side.  Patient expressed no palpitations, no shortness of breath, no fever.  Physical Exam: Vitals:   04/23/22 0454 04/23/22 1246 04/23/22 1400 04/23/22 1534  BP: (!) 142/48 (!) 144/49 (!) 150/78   Pulse: (!) 55 (!) 54 74   Resp: 18 16    Temp: (!) 96.9 F (36.1 C) 98.4 F (36.9 C)    TempSrc:  Oral    SpO2: 93% 94% 97% 99%  Weight:      Height:       General exam: Alert, awake,  oriented x 3; still complaining of chest discomfort; reports no shortness of breath, no nausea or vomiting. Respiratory system: Clear to auscultation. Respiratory effort normal.  Good saturation on room air. Cardiovascular system:RRR. No rubs or gallops. Gastrointestinal system: Abdomen is obese, nondistended, soft and nontender. No organomegaly or masses felt. Normal bowel sounds heard. Central nervous system: Alert  and oriented. No focal neurological deficits. Extremities: No cyanosis or clubbing.  No edema appreciated. Skin: No petechia. Psychiatry: Judgement and insight appear normal. Mood & affect appropriate.    Data Reviewed:   Family Communication: No family at bedside.  Disposition: Status is: Observation The patient remains OBS appropriate and will d/c before 2 midnights.   Planned Discharge Destination: Home   Time spent: 35 minutes.   Author: Barton Dubois, MD 04/23/2022 3:54 PM  For on call review [email or web address].

## 2022-04-24 ENCOUNTER — Other Ambulatory Visit (HOSPITAL_COMMUNITY): Payer: Self-pay

## 2022-04-24 ENCOUNTER — Encounter (HOSPITAL_COMMUNITY): Payer: Self-pay | Admitting: Interventional Cardiology

## 2022-04-24 ENCOUNTER — Observation Stay (INDEPENDENT_AMBULATORY_CARE_PROVIDER_SITE_OTHER): Payer: Medicare Other

## 2022-04-24 ENCOUNTER — Other Ambulatory Visit: Payer: Self-pay | Admitting: Cardiology

## 2022-04-24 DIAGNOSIS — I071 Rheumatic tricuspid insufficiency: Secondary | ICD-10-CM | POA: Diagnosis present

## 2022-04-24 DIAGNOSIS — I4719 Other supraventricular tachycardia: Secondary | ICD-10-CM

## 2022-04-24 DIAGNOSIS — Z8616 Personal history of COVID-19: Secondary | ICD-10-CM | POA: Diagnosis not present

## 2022-04-24 DIAGNOSIS — Z66 Do not resuscitate: Secondary | ICD-10-CM | POA: Diagnosis not present

## 2022-04-24 DIAGNOSIS — R079 Chest pain, unspecified: Secondary | ICD-10-CM | POA: Diagnosis not present

## 2022-04-24 DIAGNOSIS — I5032 Chronic diastolic (congestive) heart failure: Secondary | ICD-10-CM | POA: Diagnosis not present

## 2022-04-24 DIAGNOSIS — F32A Depression, unspecified: Secondary | ICD-10-CM | POA: Diagnosis not present

## 2022-04-24 DIAGNOSIS — M810 Age-related osteoporosis without current pathological fracture: Secondary | ICD-10-CM | POA: Diagnosis present

## 2022-04-24 DIAGNOSIS — N183 Chronic kidney disease, stage 3 unspecified: Secondary | ICD-10-CM | POA: Diagnosis not present

## 2022-04-24 DIAGNOSIS — Z809 Family history of malignant neoplasm, unspecified: Secondary | ICD-10-CM | POA: Diagnosis not present

## 2022-04-24 DIAGNOSIS — E1122 Type 2 diabetes mellitus with diabetic chronic kidney disease: Secondary | ICD-10-CM | POA: Diagnosis not present

## 2022-04-24 DIAGNOSIS — I493 Ventricular premature depolarization: Secondary | ICD-10-CM | POA: Diagnosis present

## 2022-04-24 DIAGNOSIS — Z8249 Family history of ischemic heart disease and other diseases of the circulatory system: Secondary | ICD-10-CM | POA: Diagnosis not present

## 2022-04-24 DIAGNOSIS — I130 Hypertensive heart and chronic kidney disease with heart failure and stage 1 through stage 4 chronic kidney disease, or unspecified chronic kidney disease: Secondary | ICD-10-CM | POA: Diagnosis not present

## 2022-04-24 DIAGNOSIS — E782 Mixed hyperlipidemia: Secondary | ICD-10-CM | POA: Diagnosis present

## 2022-04-24 DIAGNOSIS — R0789 Other chest pain: Secondary | ICD-10-CM | POA: Diagnosis not present

## 2022-04-24 DIAGNOSIS — Z1152 Encounter for screening for COVID-19: Secondary | ICD-10-CM | POA: Diagnosis not present

## 2022-04-24 DIAGNOSIS — Z79899 Other long term (current) drug therapy: Secondary | ICD-10-CM | POA: Diagnosis not present

## 2022-04-24 DIAGNOSIS — H547 Unspecified visual loss: Secondary | ICD-10-CM | POA: Diagnosis present

## 2022-04-24 DIAGNOSIS — E669 Obesity, unspecified: Secondary | ICD-10-CM | POA: Diagnosis not present

## 2022-04-24 DIAGNOSIS — E1151 Type 2 diabetes mellitus with diabetic peripheral angiopathy without gangrene: Secondary | ICD-10-CM | POA: Diagnosis not present

## 2022-04-24 DIAGNOSIS — Z87891 Personal history of nicotine dependence: Secondary | ICD-10-CM | POA: Diagnosis not present

## 2022-04-24 DIAGNOSIS — I2510 Atherosclerotic heart disease of native coronary artery without angina pectoris: Secondary | ICD-10-CM | POA: Diagnosis present

## 2022-04-24 DIAGNOSIS — G2581 Restless legs syndrome: Secondary | ICD-10-CM | POA: Diagnosis not present

## 2022-04-24 DIAGNOSIS — K219 Gastro-esophageal reflux disease without esophagitis: Secondary | ICD-10-CM | POA: Diagnosis present

## 2022-04-24 LAB — CBC
HCT: 34.9 % — ABNORMAL LOW (ref 36.0–46.0)
Hemoglobin: 11.3 g/dL — ABNORMAL LOW (ref 12.0–15.0)
MCH: 31.7 pg (ref 26.0–34.0)
MCHC: 32.4 g/dL (ref 30.0–36.0)
MCV: 98 fL (ref 80.0–100.0)
Platelets: 142 10*3/uL — ABNORMAL LOW (ref 150–400)
RBC: 3.56 MIL/uL — ABNORMAL LOW (ref 3.87–5.11)
RDW: 13.8 % (ref 11.5–15.5)
WBC: 6.2 10*3/uL (ref 4.0–10.5)
nRBC: 0 % (ref 0.0–0.2)

## 2022-04-24 LAB — TSH: TSH: 2.889 u[IU]/mL (ref 0.350–4.500)

## 2022-04-24 LAB — BASIC METABOLIC PANEL
Anion gap: 8 (ref 5–15)
BUN: 24 mg/dL — ABNORMAL HIGH (ref 8–23)
CO2: 26 mmol/L (ref 22–32)
Calcium: 8.2 mg/dL — ABNORMAL LOW (ref 8.9–10.3)
Chloride: 103 mmol/L (ref 98–111)
Creatinine, Ser: 1.24 mg/dL — ABNORMAL HIGH (ref 0.44–1.00)
GFR, Estimated: 43 mL/min — ABNORMAL LOW (ref >60)
Glucose, Bld: 101 mg/dL — ABNORMAL HIGH (ref 70–99)
Potassium: 3.8 mmol/L (ref 3.5–5.1)
Sodium: 137 mmol/L (ref 135–145)

## 2022-04-24 LAB — LIPID PANEL
Cholesterol: 92 mg/dL (ref 0–200)
HDL: 39 mg/dL — ABNORMAL LOW (ref >40)
LDL Cholesterol: 43 mg/dL (ref 0–99)
Total CHOL/HDL Ratio: 2.4 RATIO
Triglycerides: 49 mg/dL (ref <150)
VLDL: 10 mg/dL (ref 0–40)

## 2022-04-24 LAB — MAGNESIUM: Magnesium: 2.4 mg/dL (ref 1.7–2.4)

## 2022-04-24 MED ORDER — AMLODIPINE BESYLATE 5 MG PO TABS
5.0000 mg | ORAL_TABLET | Freq: Every day | ORAL | 1 refills | Status: DC
  Filled 2022-04-24: qty 30, 30d supply, fill #0

## 2022-04-24 MED ORDER — FUROSEMIDE 40 MG PO TABS: 40.0000 mg | ORAL_TABLET | Freq: Every day | ORAL | 0 refills | Status: DC

## 2022-04-24 MED ORDER — METOPROLOL TARTRATE 25 MG PO TABS
12.5000 mg | ORAL_TABLET | Freq: Two times a day (BID) | ORAL | 1 refills | Status: DC
  Filled 2022-04-24: qty 30, 30d supply, fill #0

## 2022-04-24 MED ORDER — POTASSIUM CHLORIDE CRYS ER 20 MEQ PO TBCR
20.0000 meq | EXTENDED_RELEASE_TABLET | Freq: Once | ORAL | Status: AC
  Administered 2022-04-24: 20 meq via ORAL
  Filled 2022-04-24: qty 1

## 2022-04-24 MED ORDER — EMPAGLIFLOZIN 10 MG PO TABS
10.0000 mg | ORAL_TABLET | Freq: Every day | ORAL | 1 refills | Status: DC
  Filled 2022-04-24: qty 30, 30d supply, fill #0

## 2022-04-24 MED ORDER — AMLODIPINE BESYLATE 5 MG PO TABS
5.0000 mg | ORAL_TABLET | Freq: Every day | ORAL | Status: DC
  Administered 2022-04-24: 5 mg via ORAL
  Filled 2022-04-24: qty 1

## 2022-04-24 MED ORDER — ASPIRIN 81 MG PO TBEC
81.0000 mg | DELAYED_RELEASE_TABLET | Freq: Every day | ORAL | 12 refills | Status: DC
  Filled 2022-04-24: qty 30, 30d supply, fill #0

## 2022-04-24 MED ORDER — ASPIRIN 81 MG PO TBEC
81.0000 mg | DELAYED_RELEASE_TABLET | Freq: Every day | ORAL | Status: DC
  Administered 2022-04-24: 81 mg via ORAL
  Filled 2022-04-24: qty 1

## 2022-04-24 NOTE — Progress Notes (Unsigned)
Enrolled for [employer] to mail a ZIO XT long term holter monitor to the patients address on file.   Dr. Berry to read. 

## 2022-04-24 NOTE — Discharge Summary (Signed)
Physician Discharge Summary  Leslie Hendricks UVO:[id number] DOB: [birth year] DOA: 04/21/2022  PCP: Curly Rim, MD  Admit date: 04/21/2022 Discharge date: 04/24/2022  Time spent: 40 minutes  Recommendations for Outpatient Follow-up:  Follow outpatient CBC/CMP  Follow atrial tachycardia/ziopatch outpatient Repeat chest imaging in Leslie Hendricks few weeks Follow volume/blood pressure/lytes/renal function on new meds  Discharge Diagnoses:  Principal Problem:   Chest pain at rest Active Problems:   Mixed hyperlipidemia   Benign essential hypertension   Elevated d-dimer   Chronic diastolic CHF (congestive heart failure) (HCC)   Atrial tachycardia   Chest pain   Discharge Condition: stable  Diet recommendation: heart healthy  Filed Weights   04/21/22 1559 04/21/22 2210 04/22/22 0134  Weight: 93 kg 95.8 kg 95.6 kg    History of present illness:  As per H&P written by Dr. Clearence Ped on 04/22/22 Leslie Hendricks is Leslie Hendricks 82 y.o. female with medical history significant of anxiety, cancer, depression, diabetes mellitus, GERD, hyperlipidemia, hypertension, macular degeneration, peripheral vascular disease, and more presents the ED with Leslie Hendricks chief complaint of chest pain.  Now s/p LHC with mild to moderate CAD.  Medications adjusted per cards.  Plans for outpatient follow up.  Hospital Course:  Assessment and Plan: Chest pain - resolved -- s/p LHC with mild to moderate CAD in the LAD and RCA, no culprit lesion identified for her chest pain Chest pain with radiation to left arm and neck -- aspirin, statin, metoprolol  -- appreciate cardiology recs   Chronic diastolic CHF (congestive heart failure) ([place]) - Last echo was in October 2023 and showed ejection fraction of 60-65% with grade 2 diastolic dysfunction - Appears to be currently compensated --discharging with jardiance, lasix (decreased to daily), metoprolol 12/5 mg BID (entresto being discontinued on discharge) - follow with cardiology  outpatient  - abnormal CXR and CT at presentation, representing fluid?  Repeat chest imaging outpatient.    Atrial Tachycardia -- continue metoprolol -- plan for outpatient zio   Elevated d-dimer - D-dimer elevated at 2.02 - CT angiogram done at time of admission demonstrated no pulmonary embolism. -Good oxygen saturation appreciated and patient expressed no shortness of breath symptoms.   Benign essential hypertension - Continue current dosages of metoprolol and Norvasc -Heart healthy diet discussed with patient.   Mixed hyperlipidemia -Continue statin   Class II obesity: -Body mass index is 37.33 kg/m. -Low calorie diet and portion control discussed with patient.   GERD     Procedures: LHC   Prox RCA to Mid RCA lesion is 25% stenosed.   Mid LAD lesion is 40-50% stenosed.   The left ventricular systolic function is normal.   LV end diastolic pressure is normal.   The left ventricular ejection fraction is 55-65% by visual estimate.   There is no aortic valve stenosis.   Vasospasm noted in the right radial.  If emergent cath was needed, would consider femoral approach.   Mild to moderate CAD noted in the LAD and RCA.  No culprit lesion identified for her chest pain.    Consultations: cardiology  Discharge Exam: Vitals:   04/24/22 0500 04/24/22 0903  BP: (!) 136/46 138/66  Pulse: (!) 52 (!) 53  Resp: 18   Temp: 97.6 F (36.4 C)   SpO2: 94%    No complaints  General: No acute distress. Cardiovascular: RRR Lungs: Clear to auscultation  Abdomen: Soft, nontender, nondistended  Neurological: Alert and oriented 3. Moves all extremities 4 with equal strength. Cranial nerves II through XII  grossly intact. Extremities: No clubbing or cyanosis. No edema.   Discharge Instructions   Discharge Instructions     (HEART FAILURE PATIENTS) Call MD:  Anytime you have any of the following symptoms: 1) 3 pound weight gain in 24 hours or 5 pounds in 1 week 2) shortness  of breath, with or without Leslie Hendricks dry hacking cough 3) swelling in the hands, feet or stomach 4) if you have to sleep on extra pillows at night in order to breathe.   Complete by: As directed    Call MD for:  difficulty breathing, headache or visual disturbances   Complete by: As directed    Call MD for:  extreme fatigue   Complete by: As directed    Call MD for:  hives   Complete by: As directed    Call MD for:  persistant dizziness or light-headedness   Complete by: As directed    Call MD for:  persistant nausea and vomiting   Complete by: As directed    Call MD for:  redness, tenderness, or signs of infection (pain, swelling, redness, odor or green/yellow discharge around incision site)   Complete by: As directed    Call MD for:  severe uncontrolled pain   Complete by: As directed    Call MD for:  temperature >100.4   Complete by: As directed    Diet - low sodium heart healthy   Complete by: As directed    Discharge instructions   Complete by: As directed    You were seen for an episode of chest pain.  Your symptoms have improved.  You had Leslie Hendricks cardiac catheterization which showed mild to moderate coronary artery disease, but no culprit lesion for the chest pain.  This may have been Leslie Hendricks coronary spasm?  Start aspirin.  Increase your norvasc to 5 mg daily.  Continue atorvastatin and metoprolol.  Stop entersto.  Start jardiance.  Decrease your lasix to once daily.  Cardiology is planning for an outpatient cardiac monitor for you.  Check your weights daily.  If your weight increases by 3 lbs in Leslie Hendricks day or 5 lbs in Leslie Hendricks week, call cardiology for an appointment and additional recommendations.  Return for new, recurrent, or worsening issues.  Please ask your PCP to request records from this hospitalization so they know what was done and what the next steps will be.   Increase activity slowly   Complete by: As directed       Allergies as of 04/24/2022       Reactions   Other Other (See  Comments)   Oxycodone Hcl Other (See Comments)   Tobramycin    Banana Nausea And Vomiting   Codeine Nausea And Vomiting, Other (See Comments)   Dilaudid [hydromorphone] Other (See Comments)   Altered mental status   Oxycodone Hcl Nausea And Vomiting   Sausage [pickled Meat] Nausea And Vomiting        Medication List     STOP taking these medications    Entresto 24-26 MG Generic drug: sacubitril-valsartan   HYDROcodone-acetaminophen 7.5-325 MG tablet Commonly known as: NORCO   metoprolol succinate 25 MG 24 hr tablet Commonly known as: TOPROL-XL       TAKE these medications    amLODipine 5 MG tablet Commonly known as: NORVASC Take 1 tablet (5 mg total) by mouth daily. Start taking on: April 25, 2022 What changed:  medication strength how much to take additional instructions   ARTIFICIAL TEARS OP Apply 1 Application to eye  daily.   aspirin EC 81 MG tablet Take 1 tablet (81 mg total) by mouth daily. Swallow whole. Start taking on: April 25, 2022   atorvastatin 80 MG tablet Commonly known as: LIPITOR Take 80 mg by mouth at bedtime.   empagliflozin 10 MG Tabs tablet Commonly known as: Jardiance Take 1 tablet (10 mg total) by mouth daily before breakfast.   folic acid 157 MCG tablet Commonly known as: FOLVITE Take 400 mcg by mouth daily.   furosemide 40 MG tablet Commonly known as: LASIX Take 1 tablet (40 mg total) by mouth daily. What changed: when to take this   iVIZIA Dry Eyes 0.5 % Soln Generic drug: Povidone (PF) Place 1 drop into both eyes in the morning and at bedtime.   Linzess 72 MCG capsule Generic drug: linaclotide Take 72 mcg by mouth every morning.   Magnesium 250 MG Tabs Take 1 tablet by mouth daily.   metoprolol tartrate 25 MG tablet Commonly known as: LOPRESSOR Take 0.5 tablets (12.5 mg total) by mouth 2 (two) times daily.   nystatin powder Commonly known as: MYCOSTATIN/NYSTOP Apply 1 application  topically 2 (two) times  daily.   polyethylene glycol 17 g packet Commonly known as: MIRALAX / GLYCOLAX Take 17 g by mouth daily. What changed:  when to take this reasons to take this   PreserVision/Lutein Caps Take 1 capsule by mouth 2 (two) times daily.   SYSTANE OP Place 1 drop into both eyes 2 (two) times daily.   traZODone 100 MG tablet Commonly known as: DESYREL Take 100 mg by mouth at bedtime.   venlafaxine XR 75 MG 24 hr capsule Commonly known as: EFFEXOR-XR Take 75 mg by mouth daily with breakfast.   Vitamin D-3 25 MCG (1000 UT) Caps Take 1 capsule by mouth daily.   vitamin E 180 MG (400 UNITS) capsule Take 400 Units by mouth daily.   Xanax 0.25 MG tablet Generic drug: ALPRAZolam Take 0.25 mg by mouth every 8 (eight) hours as needed for anxiety.   Xiidra 5 % Soln Generic drug: Lifitegrast Apply 1 drop to eye 2 (two) times daily.       Allergies  Allergen Reactions   Other Other (See Comments)   Oxycodone Hcl Other (See Comments)   Tobramycin    Banana Nausea And Vomiting   Codeine Nausea And Vomiting and Other (See Comments)   Dilaudid [Hydromorphone] Other (See Comments)    Altered mental status   Oxycodone Hcl Nausea And Vomiting   Sausage [Pickled Meat] Nausea And Vomiting      The results of significant diagnostics from this hospitalization (including imaging, microbiology, ancillary and laboratory) are listed below for reference.    Significant Diagnostic Studies: CARDIAC CATHETERIZATION  Result Date: 04/23/2022   Prox RCA to Mid RCA lesion is 25% stenosed.   Mid LAD lesion is 40-50% stenosed.   The left ventricular systolic function is normal.   LV end diastolic pressure is normal.   The left ventricular ejection fraction is 55-65% by visual estimate.   There is no aortic valve stenosis.   Vasospasm noted in the right radial.  If emergent cath was needed, would consider femoral approach. Mild to moderate CAD noted in the LAD and RCA.  No culprit lesion identified for  her chest pain.    CT Angio Chest PE W and/or Wo Contrast  Result Date: 04/21/2022 CLINICAL DATA:  Chest pain, elevated D-dimer, high clinical suspicion for PE EXAM: CT ANGIOGRAPHY CHEST WITH CONTRAST TECHNIQUE: Multidetector  CT imaging of the chest was performed using the standard protocol during bolus administration of intravenous contrast. Multiplanar CT image reconstructions and MIPs were obtained to evaluate the vascular anatomy. RADIATION DOSE REDUCTION: This exam was performed according to the departmental dose-optimization program which includes automated exposure control, adjustment of the mA and/or kV according to patient size and/or use of iterative reconstruction technique. CONTRAST:  68m OMNIPAQUE IOHEXOL 350 MG/ML SOLN COMPARISON:  Chest radiograph done earlier today and previous CT done on 03/16/2015 FINDINGS: Cardiovascular: There is homogeneous enhancement in thoracic aorta. Calcifications are seen in thoracic aorta and its major branches. Coronary artery calcifications are seen. There are no intraluminal filling defects in central pulmonary artery branches. Evaluation of small peripheral branches is limited by motion artifacts. Mediastinum/Nodes: No significant lymphadenopathy seen. Lungs/Pleura: There is increase in interstitial markings in the subpleural location in the lateral aspect of right upper lobe. There are linear densities in both lower lung fields. There is no focal pulmonary consolidation. There is pleural thickening in the left cardiophrenic angle. There is no significant pleural effusion. There is no pneumothorax. Upper Abdomen: Surgical clips are seen in gallbladder fossa. Calcifications are seen in visualized upper abdominal aorta and its major branches. Musculoskeletal: There is surgical fusion in cervical spine. Degenerative changes are noted in thoracic spine with large anterior bony spurs. Review of the MIP images confirms the above findings. IMPRESSION: There is no  evidence of pulmonary artery embolism. There is no evidence of thoracic aortic dissection. There is increase in interstitial markings in the lateral aspect of right upper lobe and in both lower lung fields suggesting possible scarring and interstitial pneumonia. There is no focal pulmonary consolidation. There is no pleural effusion or pneumothorax. Coronary artery disease.  Aortic atherosclerosis. Other findings as described in the body of the report. Electronically Signed   By: PElmer PickerM.D.   On: 04/21/2022 17:21   DG Chest Port 1 View  Result Date: 04/21/2022 CLINICAL DATA:  Midsternal chest pain radiating into the neck and left arm. EXAM: PORTABLE CHEST 1 VIEW COMPARISON:  Radiographs 11/22/2020 and 03/16/2015 CT. FINDINGS: 1630 hours. The heart size and mediastinal contours are stable with aortic atherosclerosis. There is new patchy airspace disease peripherally at the left lung base with Leslie Hendricks possible small left pleural effusion. The right lung appears clear. No edema or pneumothorax. There are degenerative changes in the thoracic spine associated with Kayron Kalmar convex right scoliosis. Previous cervical fusion. Telemetry leads overlie the chest. IMPRESSION: New patchy airspace disease peripherally at the left lung base with possible small left pleural effusion, potentially early pneumonia in the appropriate clinical context. Electronically Signed   By: WRichardean SaleM.D.   On: 04/21/2022 16:53    Microbiology: Recent Results (from the past 240 hour(s))  Resp panel by RT-PCR (RSV, Flu Leslie Hendricks&B, Covid) Anterior Nasal Swab     Status: None   Collection Time: 04/21/22  6:38 PM   Specimen: Anterior Nasal Swab  Result Value Ref Range Status   SARS Coronavirus 2 by RT PCR NEGATIVE NEGATIVE Final    Comment: (NOTE) SARS-CoV-2 target nucleic acids are NOT DETECTED.  The SARS-CoV-2 RNA is generally detectable in upper respiratory specimens during the acute phase of infection. The lowest concentration of  SARS-CoV-2 viral copies this assay can detect is 138 copies/mL. Leslie Hendricks negative result does not preclude SARS-Cov-2 infection and should not be used as the sole basis for treatment or other patient management decisions. Leslie Hendricks negative result may occur with  improper  specimen collection/handling, submission of specimen other than nasopharyngeal swab, presence of viral mutation(s) within the areas targeted by this assay, and inadequate number of viral copies(<138 copies/mL). Leslie Hendricks negative result must be combined with clinical observations, patient history, and epidemiological information. The expected result is Negative.  Fact Sheet for Patients:  EntrepreneurPulse.com.au  Fact Sheet for Healthcare Providers:  IncredibleEmployment.be  This test is no t yet approved or cleared by the [place] FDA and  has been authorized for detection and/or diagnosis of SARS-CoV-2 by FDA under an Emergency Use Authorization (EUA). This EUA will remain  in effect (meaning this test can be used) for the duration of the COVID-19 declaration under Section 564(b)(1) of the Act, 21 U.S.C.section 360bbb-3(b)(1), unless the authorization is terminated  or revoked sooner.       Influenza Leslie Hendricks by PCR NEGATIVE NEGATIVE Final   Influenza B by PCR NEGATIVE NEGATIVE Final    Comment: (NOTE) The Xpert Xpress SARS-CoV-2/FLU/RSV plus assay is intended as an aid in the diagnosis of influenza from Nasopharyngeal swab specimens and should not be used as Leslie Hendricks sole basis for treatment. Nasal washings and aspirates are unacceptable for Xpert Xpress SARS-CoV-2/FLU/RSV testing.  Fact Sheet for Patients: EntrepreneurPulse.com.au  Fact Sheet for Healthcare Providers: IncredibleEmployment.be  This test is not yet approved or cleared by the [place] FDA and has been authorized for detection and/or diagnosis of SARS-CoV-2 by FDA under an Emergency Use  Authorization (EUA). This EUA will remain in effect (meaning this test can be used) for the duration of the COVID-19 declaration under Section 564(b)(1) of the Act, 21 U.S.C. section 360bbb-3(b)(1), unless the authorization is terminated or revoked.     Resp Syncytial Virus by PCR NEGATIVE NEGATIVE Final    Comment: (NOTE) Fact Sheet for Patients: EntrepreneurPulse.com.au  Fact Sheet for Healthcare Providers: IncredibleEmployment.be  This test is not yet approved or cleared by the [place] FDA and has been authorized for detection and/or diagnosis of SARS-CoV-2 by FDA under an Emergency Use Authorization (EUA). This EUA will remain in effect (meaning this test can be used) for the duration of the COVID-19 declaration under Section 564(b)(1) of the Act, 21 U.S.C. section 360bbb-3(b)(1), unless the authorization is terminated or revoked.  Performed at [facility], [address] [place], [place] [address]      Labs: Basic Metabolic Panel: Recent Labs  Lab 04/21/22 1605 04/23/22 1408 04/23/22 1815 04/24/22 0138  NA 136 139 140 137  K 3.5 4.2 3.9 3.8  CL 98 107 106 103  CO2 '27 22 28 26  '$ GLUCOSE 183* 95 109* 101*  BUN 42* 27* 23 24*  CREATININE 1.19* 1.21* 1.24* 1.24*  CALCIUM 9.3 8.7* 8.7* 8.2*  MG  --   --   --  2.4   Liver Function Tests: Recent Labs  Lab 04/21/22 1605 04/23/22 1408 04/23/22 1815  AST '29 24 23  '$ ALT '29 22 23  '$ ALKPHOS 83 73 73  BILITOT 0.8 0.9 0.9  PROT 6.7 5.7* 5.6*  ALBUMIN 3.7 3.2* 2.9*   No results for input(s): "LIPASE", "AMYLASE" in the last 168 hours. No results for input(s): "AMMONIA" in the last 168 hours. CBC: Recent Labs  Lab 04/21/22 1605 04/23/22 1408 04/23/22 1815 04/24/22 0138  WBC 5.8 5.7 6.1 6.2  NEUTROABS 2.6  --   --   --   HGB 12.9 12.3 12.5 11.3*  HCT 40.2 38.7 38.6 34.9*  MCV 98.8 100.0 98.5 98.0  PLT 173 126* 153 142*   Cardiac  Enzymes: No results for input(s):  "CKTOTAL", "CKMB", "CKMBINDEX", "TROPONINI" in the last 168 hours. BNP: BNP (last 3 results) No results for input(s): "BNP" in the last 8760 hours.  ProBNP (last 3 results) No results for input(s): "PROBNP" in the last 8760 hours.  CBG: Recent Labs  Lab 04/23/22 1757 04/23/22 2142  GLUCAP 99 188*       Signed:  Fayrene Helper MD.  [facility] 04/24/2022, 2:24 PM

## 2022-04-24 NOTE — Progress Notes (Signed)
Mobility Specialist - Progress Note   04/24/22 1547  Mobility  Activity Ambulated with assistance to bathroom  Level of Assistance Minimal assist, patient does 75% or more  Assistive Device Front wheel walker  Distance Ambulated (ft) 10 ft  Activity Response Tolerated well  Mobility Referral Yes  $Mobility charge 1 Mobility   Pt was received received in chair requesting assistance to BR. No complaints throughout transfer. Pt was MaxA for pericare. Pt was returned to chair with all needs met. NT notified.   Franki Monte  Mobility Specialist Please contact via Solicitor or Rehab office at [phone]

## 2022-04-24 NOTE — TOC Benefit Eligibility Note (Signed)
Patient Teacher, English as a foreign language completed.    The patient is currently admitted and upon discharge could be taking Jardiance 10 mg.  The current 30 day co-pay is $11.20.   The patient is insured through [place], [place] Patient Advocate Specialist [place] Patient Advocate Team Direct Number: [phone]  Fax: [phone]

## 2022-04-24 NOTE — Progress Notes (Addendum)
Rounding Note    Patient Name: Leslie Hendricks Date of Encounter: 04/24/2022  [place] Cardiologist: Quay Burow, MD   Subjective   Feeling well this morning. No chest pain.   Inpatient Medications    Scheduled Meds:  amiodarone  200 mg Oral BID   amLODipine  2.5 mg Oral Daily   atorvastatin  80 mg Oral QHS   heparin  5,000 Units Subcutaneous Q8H   metoprolol tartrate  12.5 mg Oral BID   pantoprazole  40 mg Oral BID   polyethylene glycol  17 g Oral Daily   sodium chloride flush  3 mL Intravenous Q12H   sodium chloride flush  3 mL Intravenous Q12H   traZODone  100 mg Oral QHS   venlafaxine XR  75 mg Oral Q breakfast   Continuous Infusions:  sodium chloride     PRN Meds: sodium chloride, acetaminophen, acetaminophen, ALPRAZolam, ondansetron (ZOFRAN) IV, ondansetron (ZOFRAN) IV, sodium chloride flush   Vital Signs    Vitals:   04/23/22 1745 04/23/22 2055 04/24/22 0101 04/24/22 0500  BP: (!) 155/52 (!) 131/58 (!) 137/47 (!) 136/46  Pulse: (!) 58 (!) 49 (!) 50 (!) 52  Resp: '16 19 18 18  '$ Temp: 98 F (36.7 C) 97.6 F (36.4 C) 97.6 F (36.4 C) 97.6 F (36.4 C)  TempSrc: Oral Oral Axillary Axillary  SpO2: 95% 95% 97% 94%  Weight:      Height:        Intake/Output Summary (Last 24 hours) at 04/24/2022 0830 Last data filed at 04/24/2022 0500 Gross per 24 hour  Intake 120 ml  Output 1200 ml  Net -1080 ml      04/22/2022    1:34 AM 04/21/2022   10:10 PM 04/21/2022    3:59 PM  Last 3 Weights  Weight (lbs) 210 lb 12.2 oz 211 lb 3.2 oz 205 lb  Weight (kg) 95.6 kg 95.8 kg 92.987 kg      Telemetry    Sinus Bradycardia, intermittent bigeminy - Personally Reviewed  ECG    No new tracing   Physical Exam   GEN: No acute distress.   Neck: No JVD Cardiac: RRR, no murmurs, rubs, or gallops.  Respiratory: Clear to auscultation bilaterally. GI: Soft, nontender, non-distended  MS: No edema; No deformity. Right radial cath site  Neuro:  Nonfocal   Psych: Normal affect   Labs    High Sensitivity Troponin:   Recent Labs  Lab 04/21/22 1605 04/21/22 1815  TROPONINIHS 6 10     Chemistry Recent Labs  Lab 04/21/22 1605 04/23/22 1408 04/23/22 1815 04/24/22 0138  NA 136 139 140 137  K 3.5 4.2 3.9 3.8  CL 98 107 106 103  CO2 '27 22 28 26  '$ GLUCOSE 183* 95 109* 101*  BUN 42* 27* 23 24*  CREATININE 1.19* 1.21* 1.24* 1.24*  CALCIUM 9.3 8.7* 8.7* 8.2*  MG  --   --   --  2.4  PROT 6.7 5.7* 5.6*  --   ALBUMIN 3.7 3.2* 2.9*  --   AST '29 24 23  '$ --   ALT '29 22 23  '$ --   ALKPHOS 83 73 73  --   BILITOT 0.8 0.9 0.9  --   GFRNONAA 45* 44* 43* 43*  ANIONGAP '11 10 6 8    '$ Lipids  Recent Labs  Lab 04/24/22 0138  CHOL 92  TRIG 49  HDL 39*  LDLCALC 43  CHOLHDL 2.4    Hematology Recent Labs  Lab 04/23/22 1408 04/23/22 1815 04/24/22 0138  WBC 5.7 6.1 6.2  RBC 3.87 3.92 3.56*  HGB 12.3 12.5 11.3*  HCT 38.7 38.6 34.9*  MCV 100.0 98.5 98.0  MCH 31.8 31.9 31.7  MCHC 31.8 32.4 32.4  RDW 14.0 13.8 13.8  PLT 126* 153 142*   Thyroid  Recent Labs  Lab 04/24/22 0138  TSH 2.889    BNPNo results for input(s): "BNP", "PROBNP" in the last 168 hours.  DDimer  Recent Labs  Lab 04/21/22 1605  DDIMER 2.02*     Radiology    CARDIAC CATHETERIZATION  Result Date: 04/23/2022   Prox RCA to Mid RCA lesion is 25% stenosed.   Mid LAD lesion is 40-50% stenosed.   The left ventricular systolic function is normal.   LV end diastolic pressure is normal.   The left ventricular ejection fraction is 55-65% by visual estimate.   There is no aortic valve stenosis.   Vasospasm noted in the right radial.  If emergent cath was needed, would consider femoral approach. Mild to moderate CAD noted in the LAD and RCA.  No culprit lesion identified for her chest pain.     Cardiac Studies   Cath: 04/23/2022    Prox RCA to Mid RCA lesion is 25% stenosed.   Mid LAD lesion is 40-50% stenosed.   The left ventricular systolic function is normal.   LV end  diastolic pressure is normal.   The left ventricular ejection fraction is 55-65% by visual estimate.   There is no aortic valve stenosis.   Vasospasm noted in the right radial.  If emergent cath was needed, would consider femoral approach.   Mild to moderate CAD noted in the LAD and RCA.  No culprit lesion identified for her chest pain.    Patient Profile     82 y.o. female with PMH of HTN, DM 2, HLD, carotid artery stenosis, CKD stage III, nicotine abuse 3, frequent falls, COVID-19 infection in 06/2020, family history of CAD presented to the hospital with chest pain. EKG showed normal sinus rhythm and no ST changes. High-sensitivity troponins were within normal limits and negative. Telemetry reviewed and showed ventricular bigeminy, frequent. Patient has been having DOE since COVID infection and never resolved.   Assessment & Plan    Chest pain -- Underwent cardiac catheterization noted above with 25% proximal/mid RCA, 40 to 45% mid LAD with no culprit lesion identified.  LVEDP was normal, LVEF 55 to 65% on LV gram.  Recommendations for medical therapy and risk factor modification. Question coronary spasm?  -- Continue aspirin, statin, metoprolol, increase Norvasc to 5 mg daily  Hypertension -- Blood pressures remain slightly elevated -- Continue metoprolol 12.5 mg twice daily, increase Norvasc to 5 mg daily -- previously on Entresto PTA, given her age, mild renal dysfunction and addition of jardiance with PRN lasix, would not plan to resume Entresto   HFpEF -- Echo 01/2022 with LVEF of 60 to 46%, grade 2 diastolic dysfunction -- Received IV Lasix on admission, currently euvolemic -- Has been started on Jardiance, would recommend lowering her home Lasix dose to as needed -- continue metoprolol 12.'5mg'$  BID  HLD -- LDL 43, HDL 39 -- Continue statin  Atrial tachycardia PVCs --She was started on metoprolol 12.5 mg twice daily as well as amiodarone 200 mg twice daily while at [facility] -- Currently sinus bradycardia, some intermittent bigeminy noted.  Will stop amiodarone for now with thoughts to increase BB therapy at tolerated -- plan  for Zio monitor at DC to quantify PVC burden, rule out other arrhythmias  DM -- continue jardiance   Will arrange outpatient follow up in the office   For questions or updates, please contact [place] Please consult [email or web address] for contact info under        Signed, Reino Bellis, NP  04/24/2022, 8:30 AM    Patient seen, examined. Available data reviewed. Agree with findings, assessment, and plan as outlined by Reino Bellis, NP.  The patient is independently interviewed and examined.  She is alert, oriented, in no distress.  HEENT is normal, JVP is normal, lungs are clear bilaterally, heart is regular rate and rhythm with no murmur gallop, abdomen is soft and nontender, extremities have no edema, right radial cath site is clear with no hematoma or ecchymosis.  There is some mild tenderness to palpation.  The patient's cardiac catheterization demonstrated nonobstructive coronary artery disease.  She has a history of heart failure with preserved ejection fraction with LVEF 60 to 65%.  She did have acute on chronic heart failure with volume overload on admission.  She was treated with IV diuretic therapy and she states that her edema has resolved.  She has subsequently been started on Jardiance and will continue on furosemide at a lower dose of 40 mg daily.  We have discontinued her amiodarone which was just started for paroxysms of atrial tachycardia.  The patient has been in sinus bradycardia with some slow rates here with no further episodes of atrial tach.  We will arrange an outpatient monitor for further evaluation after discharge.  Otherwise as outlined above.  Sherren Mocha, M.D. 04/24/2022 10:32 AM

## 2022-04-25 LAB — LIPOPROTEIN A (LPA): Lipoprotein (a): 29.5 nmol/L (ref <75.0)

## 2022-04-27 DIAGNOSIS — I493 Ventricular premature depolarization: Secondary | ICD-10-CM

## 2022-05-02 ENCOUNTER — Encounter (HOSPITAL_BASED_OUTPATIENT_CLINIC_OR_DEPARTMENT_OTHER): Payer: Self-pay | Admitting: Family

## 2022-05-02 ENCOUNTER — Ambulatory Visit (INDEPENDENT_AMBULATORY_CARE_PROVIDER_SITE_OTHER): Payer: Medicare Other | Admitting: Family

## 2022-05-02 ENCOUNTER — Encounter: Payer: Self-pay | Admitting: Podiatry

## 2022-05-02 ENCOUNTER — Ambulatory Visit (INDEPENDENT_AMBULATORY_CARE_PROVIDER_SITE_OTHER): Payer: Medicare Other | Admitting: Podiatry

## 2022-05-02 VITALS — BP 134/62 | HR 55 | Ht 63.0 in | Wt 212.0 lb

## 2022-05-02 DIAGNOSIS — D2371 Other benign neoplasm of skin of right lower limb, including hip: Secondary | ICD-10-CM

## 2022-05-02 DIAGNOSIS — M79676 Pain in unspecified toe(s): Secondary | ICD-10-CM

## 2022-05-02 DIAGNOSIS — E785 Hyperlipidemia, unspecified: Secondary | ICD-10-CM

## 2022-05-02 DIAGNOSIS — I25118 Atherosclerotic heart disease of native coronary artery with other forms of angina pectoris: Secondary | ICD-10-CM

## 2022-05-02 DIAGNOSIS — B351 Tinea unguium: Secondary | ICD-10-CM | POA: Diagnosis not present

## 2022-05-02 DIAGNOSIS — D2372 Other benign neoplasm of skin of left lower limb, including hip: Secondary | ICD-10-CM | POA: Diagnosis not present

## 2022-05-02 DIAGNOSIS — I6523 Occlusion and stenosis of bilateral carotid arteries: Secondary | ICD-10-CM

## 2022-05-02 DIAGNOSIS — M722 Plantar fascial fibromatosis: Secondary | ICD-10-CM

## 2022-05-02 DIAGNOSIS — I10 Essential (primary) hypertension: Secondary | ICD-10-CM | POA: Diagnosis not present

## 2022-05-02 DIAGNOSIS — I5032 Chronic diastolic (congestive) heart failure: Secondary | ICD-10-CM | POA: Diagnosis not present

## 2022-05-02 DIAGNOSIS — M79672 Pain in left foot: Secondary | ICD-10-CM | POA: Diagnosis not present

## 2022-05-02 DIAGNOSIS — N1832 Chronic kidney disease, stage 3b: Secondary | ICD-10-CM

## 2022-05-02 DIAGNOSIS — I493 Ventricular premature depolarization: Secondary | ICD-10-CM

## 2022-05-02 MED ORDER — METOPROLOL TARTRATE 25 MG PO TABS: 12.5000 mg | ORAL_TABLET | Freq: Two times a day (BID) | ORAL | 3 refills | Status: DC

## 2022-05-02 MED ORDER — TRIAMCINOLONE ACETONIDE 40 MG/ML IJ SUSP
20.0000 mg | Freq: Once | INTRAMUSCULAR | Status: AC
  Administered 2022-05-02: 20 mg

## 2022-05-02 MED ORDER — EMPAGLIFLOZIN 10 MG PO TABS: 10.0000 mg | ORAL_TABLET | Freq: Every day | ORAL | 3 refills | Status: DC

## 2022-05-02 MED ORDER — ASPIRIN 81 MG PO TBEC: 81.0000 mg | DELAYED_RELEASE_TABLET | Freq: Every day | ORAL | 3 refills | Status: AC

## 2022-05-02 MED ORDER — FUROSEMIDE 40 MG PO TABS: ORAL_TABLET | ORAL | 3 refills | Status: DC

## 2022-05-02 MED ORDER — AMLODIPINE BESYLATE 5 MG PO TABS: 5.0000 mg | ORAL_TABLET | Freq: Every day | ORAL | 3 refills | Status: DC

## 2022-05-02 MED ORDER — ATORVASTATIN CALCIUM 80 MG PO TABS: 80.0000 mg | ORAL_TABLET | Freq: Every day | ORAL | 3 refills | Status: AC

## 2022-05-02 NOTE — Patient Instructions (Addendum)
Medication Instructions:  Continue your current medications.   *If you need a refill on your cardiac medications before your next appointment, please call your pharmacy*   Lab Work: Your physician recommends that you return for lab work in today CBC/CMP  If you have labs (blood work) drawn today and your tests are completely normal, you will receive your results only by: MyChart Message (if you have MyChart) OR A paper copy in the mail If you have any lab test that is abnormal or we need to change your treatment, we will call you to review the results.   Testing/Procedures: None ordered today.    Follow-Up: At [facility], you and your health needs are our priority.  As part of our continuing mission to provide you with exceptional heart care, we have created designated Provider Care Teams.  These Care Teams include your primary Cardiologist (physician) and Advanced Practice Providers (APPs -  Physician Assistants and Nurse Practitioners) who all work together to provide you with the care you need, when you need it.  We recommend signing up for the patient portal called "MyChart".  Sign up information is provided on this After Visit Summary.  MyChart is used to connect with patients for Virtual Visits (Telemedicine).  Patients are able to view lab/test results, encounter notes, upcoming appointments, etc.  Non-urgent messages can be sent to your provider as well.   To learn more about what you can do with MyChart, go to [email or web address].    Your next appointment:   3-4 month(s)  Provider:   Quay Burow, MD  or Loel Dubonnet, NP    Other Instructions  Heart Healthy Diet Recommendations: A low-salt diet is recommended. Meats should be grilled, baked, or boiled. Avoid fried foods. Focus on lean protein sources like fish or chicken with vegetables and fruits. The American Heart Association is a Microbiologist!  American Heart Association Diet and Lifeystyle  Recommendations   Exercise recommendations: The American Heart Association recommends 150 minutes of moderate intensity exercise weekly. Try 30 minutes of moderate intensity exercise 4-5 times per week. This could include walking, jogging, or swimming.
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
## 2022-05-02 NOTE — Progress Notes (Signed)
She presents today with her sister for follow-up of her painful elongated toenails and calluses to the first metatarsal phalangeal joint and interphalangeal joint of the hallux left she is also complaining of painful heel pain left.  Objective: Vital signs stable oriented x 3 pulses are palpable.  She does have painful callus plantar aspect of the interphalangeal joint hallux left pain on palpation medial calcaneal tubercle of her left heel.  She also has long thick yellow dystrophic onychomycotic nails.  Assessment: Planter fasciitis left foot.  Painful benign skin lesion hallux left.  And painful mycotic nails bilateral.  Plan: Debrided all reactive hyperkeratotic tissue debrided all painful toenails bilateral.  Injected the left heel with 10 mg Kenalog 5 mg Marcaine point maximal tenderness.

## 2022-05-03 LAB — COMPREHENSIVE METABOLIC PANEL
ALT: 30 IU/L (ref 0–32)
AST: 27 IU/L (ref 0–40)
Albumin/Globulin Ratio: 1.6 (ref 1.2–2.2)
Albumin: 4.2 g/dL (ref 3.7–4.7)
Alkaline Phosphatase: 115 IU/L (ref 44–121)
BUN/Creatinine Ratio: 28 (ref 12–28)
BUN: 37 mg/dL — ABNORMAL HIGH (ref 8–27)
Bilirubin Total: 0.4 mg/dL (ref 0.0–1.2)
CO2: 28 mmol/L (ref 20–29)
Calcium: 9.8 mg/dL (ref 8.7–10.3)
Chloride: 102 mmol/L (ref 96–106)
Creatinine, Ser: 1.34 mg/dL — ABNORMAL HIGH (ref 0.57–1.00)
Globulin, Total: 2.6 g/dL (ref 1.5–4.5)
Glucose: 90 mg/dL (ref 70–99)
Potassium: 4.7 mmol/L (ref 3.5–5.2)
Sodium: 142 mmol/L (ref 134–144)
Total Protein: 6.8 g/dL (ref 6.0–8.5)
eGFR: 39 mL/min/{1.73_m2} — ABNORMAL LOW (ref >59)

## 2022-05-03 LAB — CBC
Hematocrit: 37.5 % (ref 34.0–46.6)
Hemoglobin: 12.2 g/dL (ref 11.1–15.9)
MCH: 31.2 pg (ref 26.6–33.0)
MCHC: 32.5 g/dL (ref 31.5–35.7)
MCV: 96 fL (ref 79–97)
Platelets: 199 10*3/uL (ref 150–450)
RBC: 3.91 x10E6/uL (ref 3.77–5.28)
RDW: 12.7 % (ref 11.7–15.4)
WBC: 6.2 10*3/uL (ref 3.4–10.8)

## 2022-05-07 ENCOUNTER — Ambulatory Visit (INDEPENDENT_AMBULATORY_CARE_PROVIDER_SITE_OTHER): Payer: Medicare Other | Admitting: Pulmonary Disease

## 2022-05-07 ENCOUNTER — Encounter: Payer: Self-pay | Admitting: Pulmonary Disease

## 2022-05-07 VITALS — BP 132/78 | HR 60 | Temp 98.3°F | Ht 63.0 in | Wt 216.0 lb

## 2022-05-07 DIAGNOSIS — I25118 Atherosclerotic heart disease of native coronary artery with other forms of angina pectoris: Secondary | ICD-10-CM

## 2022-05-07 DIAGNOSIS — G4733 Obstructive sleep apnea (adult) (pediatric): Secondary | ICD-10-CM | POA: Insufficient documentation

## 2022-05-07 NOTE — Progress Notes (Signed)
   Subjective:    Patient ID: Leslie Hendricks, female    DOB: [birth year], 82 y.o.   MRN: [id number]  HPI  82 year old woman for follow-up of OSA  PMH - Hypertension Diabetes Uterine cancer COVID infection 11/2020 associated with fall and bilateral lower extremity popliteal DVT, took Eliquis for 3 months Bilateral lower extremity edema, chronic diastolic heart failure, on Lasix 40 twice daily  Chief Complaint  Patient presents with   Follow-up    CPAP working well now.  Recently D/C'd from hospital for CP    This is her first follow-up visit since her diagnosis, we undertook sleep study 05/2021 that showed moderate OSA.  She was referred by her ophthalmologist whom she was seeing for macular degeneration After a formal titration study, we set her up with auto CPAP with a fullface mask.  She is struggled with different masks but now seems to have found a better fitting 1.  The air was leaking into her eyes and now she is using daughter goggles when she sleeps. Her Sister Leslie Hendricks accompanies and corroborates history and jokes about this. She does feel better rested on nights but on some mornings she will have dry eyes. No problems with pressure   Significant tests/ events reviewed  HST 05/2021 AHI 18/h, low sat 72% CPAP 06/2021 >> 15 cm    CT chest without contrast 03/2015 no nodule noted  Review of Systems neg for any significant sore throat, dysphagia, itching, sneezing, nasal congestion or excess/ purulent secretions, fever, chills, sweats, unintended wt loss, pleuritic or exertional cp, hempoptysis, orthopnea pnd or change in chronic leg swelling. Also denies presyncope, palpitations, heartburn, abdominal pain, nausea, vomiting, diarrhea or change in bowel or urinary habits, dysuria,hematuria, rash, arthralgias, visual complaints, headache, numbness weakness or ataxia.     Objective:   Physical Exam  Gen. Pleasant, obese, in no distress ENT - no lesions, no post nasal  drip Neck: No JVD, no thyromegaly, no carotid bruits Lungs: no use of accessory muscles, no dullness to percussion, decreased without rales or rhonchi  Cardiovascular: Rhythm regular, heart sounds  normal, no murmurs or gallops, no peripheral edema Musculoskeletal: No deformities, no cyanosis or clubbing , no tremors        Assessment & Plan:

## 2022-05-07 NOTE — Patient Instructions (Signed)
  X Call [phone] to make appt for mask fitting session  CPAP is working  OK to use eye mask/ goggles

## 2022-05-07 NOTE — Assessment & Plan Note (Addendum)
CPAP download was reviewed which shows residual AHI of 7/hour on auto settings 10 to 15 cm with average pressure 14 and maximum pressure of 14.5 cm.  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  She has acceptable usage with average 6.5 hours per night but large leak and on some nights AHI is as high as 10-20 events per hour. Overall she is compliant and CPAP is definitely helped improve her daytime somnolence and fatigue She does seem to have some treatment emergent central apneas, if this remains an issue in future download we may consider BiPAP but for now we will persist with CPAP therapy.  The main issue seems to be large mask leak which is also bothering her eyes. Will refer her for mask fitting session and hopefully can find a better fitting mask than the one she has. We discussed alternative therapies -she is not a good candidate for oral appliance since she is edentulous and doubt that we have to put her through hypoglossal nerve stimulator implant   Weight loss encouraged, compliance with goal of at least 4-6 hrs every night is the expectation. Advised against medications with sedative side effects Cautioned against driving when sleepy - understanding that sleepiness will vary on a day to day basis

## 2022-05-08 ENCOUNTER — Encounter (HOSPITAL_BASED_OUTPATIENT_CLINIC_OR_DEPARTMENT_OTHER): Payer: Self-pay | Admitting: Family

## 2022-05-20 ENCOUNTER — Telehealth: Payer: Self-pay | Admitting: Cardiovascular Disease

## 2022-05-20 NOTE — Telephone Encounter (Signed)
Patient is calling for results to the zio monitor she wore last month.

## 2022-05-20 NOTE — Telephone Encounter (Signed)
Called patient, advised that results had not been given recommendations by Dr.Berry, as soon as he gave recommendations his nurse would be in contact.  Patient verbalized understanding

## 2022-05-22 NOTE — Telephone Encounter (Signed)
Leslie Harp, MD 05/22/2022  6:20 AM EST     Patch Wear Time:  13 days and 10 hours (2024-01-13T09:48:17-0500 to 2024-01-26T19:50:12-498)   Patient had a min HR of 38 bpm, max HR of 160 bpm, and avg HR of 54 bpm. Predominant underlying rhythm was Sinus Rhythm. 1 run of Ventricular Tachycardia occurred lasting 4 beats with a max rate of 148 bpm (avg 133 bpm). 93 Supraventricular Tachycardia runs occurred, the run with the fastest interval lasting 6 beats with a max rate of 160 bpm, the longest lasting 18.3 secs with an avg rate of 97 bpm. Isolated SVEs were rare (<1.0%), SVE Couplets were rare (<1.0%), and SVE Triplets were rare (<1.0%). Isolated VEs were frequent (5.3%, 54298), VE Couplets were rare (<1.0%, 78), and no VE Triplets were present. Ventricular Bigeminy and Trigeminy were present   1. SR/SB 2. Freq PVCs (5.3% burden). 3. Occasional short runs of NSVT 4. 93 short runs of PSVT (longest 18 seconds at 97 BPM)

## 2022-05-24 ENCOUNTER — Encounter (HOSPITAL_BASED_OUTPATIENT_CLINIC_OR_DEPARTMENT_OTHER): Payer: Self-pay

## 2022-05-24 DIAGNOSIS — R0609 Other forms of dyspnea: Secondary | ICD-10-CM

## 2022-05-28 ENCOUNTER — Ambulatory Visit (HOSPITAL_BASED_OUTPATIENT_CLINIC_OR_DEPARTMENT_OTHER): Payer: Medicare Other | Attending: Pulmonary Disease | Admitting: Radiology

## 2022-05-28 ENCOUNTER — Ambulatory Visit (HOSPITAL_BASED_OUTPATIENT_CLINIC_OR_DEPARTMENT_OTHER): Payer: Medicare Other

## 2022-05-28 DIAGNOSIS — R0609 Other forms of dyspnea: Secondary | ICD-10-CM

## 2022-05-29 ENCOUNTER — Ambulatory Visit (INDEPENDENT_AMBULATORY_CARE_PROVIDER_SITE_OTHER): Payer: Medicare Other | Admitting: Physician Assistant

## 2022-05-29 DIAGNOSIS — M25512 Pain in left shoulder: Secondary | ICD-10-CM | POA: Diagnosis not present

## 2022-05-29 DIAGNOSIS — G8929 Other chronic pain: Secondary | ICD-10-CM | POA: Diagnosis not present

## 2022-05-29 MED ORDER — METHYLPREDNISOLONE ACETATE 40 MG/ML IJ SUSP
60.0000 mg | INTRAMUSCULAR | Status: AC | PRN
  Administered 2022-05-29: 60 mg via INTRA_ARTICULAR

## 2022-05-29 MED ORDER — LIDOCAINE HCL 1 % IJ SOLN
2.0000 mL | INTRAMUSCULAR | Status: AC | PRN
  Administered 2022-05-29: 2 mL

## 2022-05-29 MED ORDER — BUPIVACAINE HCL 0.25 % IJ SOLN
2.0000 mL | INTRAMUSCULAR | Status: AC | PRN
  Administered 2022-05-29: 2 mL via INTRA_ARTICULAR

## 2022-05-29 NOTE — Telephone Encounter (Signed)
Lorretta Harp, MD  You6 days ago    Needs ROV to discuss   Left message for pt to call back.
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
## 2022-05-30 ENCOUNTER — Encounter: Payer: Self-pay | Admitting: Cardiovascular Disease

## 2022-05-30 ENCOUNTER — Ambulatory Visit: Payer: Medicare Other | Attending: Cardiovascular Disease | Admitting: Cardiovascular Disease

## 2022-05-30 VITALS — BP 132/64 | HR 63 | Ht 63.0 in | Wt 215.0 lb

## 2022-05-30 DIAGNOSIS — E782 Mixed hyperlipidemia: Secondary | ICD-10-CM

## 2022-05-30 DIAGNOSIS — R002 Palpitations: Secondary | ICD-10-CM | POA: Diagnosis not present

## 2022-05-30 DIAGNOSIS — R600 Localized edema: Secondary | ICD-10-CM | POA: Diagnosis not present

## 2022-05-30 DIAGNOSIS — I6523 Occlusion and stenosis of bilateral carotid arteries: Secondary | ICD-10-CM | POA: Diagnosis not present

## 2022-05-30 DIAGNOSIS — I5032 Chronic diastolic (congestive) heart failure: Secondary | ICD-10-CM | POA: Diagnosis present

## 2022-05-30 DIAGNOSIS — I10 Essential (primary) hypertension: Secondary | ICD-10-CM

## 2022-05-30 DIAGNOSIS — R079 Chest pain, unspecified: Secondary | ICD-10-CM | POA: Diagnosis present

## 2022-05-30 MED ORDER — METOPROLOL TARTRATE 25 MG PO TABS: 25.0000 mg | ORAL_TABLET | Freq: Two times a day (BID) | ORAL | 3 refills | Status: DC

## 2022-05-30 NOTE — Assessment & Plan Note (Signed)
History of palpitations with recent event monitor performed 04/24/2022 revealing frequent PVCs with a 5.3% burden and 93 short runs of PSVT the longest being 18 seconds at 97 bpm.  She is on low-dose metoprolol 12.5 mg p.o. twice daily.  There is no A-fib noted.  I am going to increase her metoprolol from 12.5 to 25 mg p.o. twice daily.  Resting pulse today is 63.

## 2022-05-30 NOTE — Progress Notes (Signed)
05/30/2022 Leslie Hendricks   1940-09-04  [id number]  Primary Physician Corrington, Delsa Grana, MD Primary Cardiologist: Lorretta Harp MD Garret Reddish, [place], [place]  HPI:  Leslie Hendricks is a 82 y.o.    mildly overweight, widowed Caucasian female with no children who lives alone.  She is accompanied by her Sister Leslie Hendricks today.  Her husband of 28 years died on 04/18/2009. She has retired from the [employer]. I last saw her in the office 03/14/2021. Her risk factors include 45 pack-year history of tobacco abuse, having quit 12 years ago; treated hypertension; diabetes; and hyperlipidemia. She does have a strong family history of heart disease. Both parents had bypass surgery. Three of her siblings have myocardial infractions. She had a negative pharmacologic stress test on July 03, 2010. She denies chest pain or shortness of breath. Her major complaints are of lack of stability on her legs, but she really denies claudication. She walks with the aid of a walker. She had arterial Dopplers performed in our office in March of 2012 revealing ABI of 0.98 on the right and 0.77 on the left. She has a very limited functional existence from bed to chair. She rarely gets out of the house. Since I saw her back over one year ago she's had no cardiovascular complaints.she was evaluated by Dr. Servando Snare because of the lung nodule that was not metabolic and therefore the decision was made to follow this conservatively. She apparently needs a knee replacement and was referred back here for cardiovascular clearance.  She had her left total knee replacement 2016.  I saw her over a year ago she now walks with a walker but still is has limited mobility. She gets occasional atypical chest pain and has complained of some palpitations since her rotator cuff surgery in October of last year. I performed a 30 day event monitor which showed only unifocal PVCs.   She had been complaining of increasing dyspnea on exertion.   She was placed on a diuretic because of lower extreme edema by her PCP.  Her last Myoview performed 07/03/2010 was nonischemic and 2D echo performed 2016 showed normal LV function.  We did perform 2D echocardiography September 2020 as well as a Myoview stress test all of which were unremarkable.   She  was hospitalized in July for [place].  She had a difficult post hospital course.  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  She does live alone but ambulates with a walker.    She was hospital for 3 days on 04/21/2022 for chest pain.  Chest CT was negative for pulmonary embolism.  Left heart cath by Dr. Irish Lack showed minimal nonobstructive CAD.  She was discharged home on 04/24/2018.  She did have an event monitor performed 1/that 10/24 revealing frequent PVCs with a 5.3% burden and 93 short runs of PSVT.  She is on low-dose beta-blocker.  She is on oral diuretic for lower extremity edema and chronic diastolic heart failure with instructions to double the dose should she have increasing lower extremity edema.   Current Meds  Medication Sig   amLODipine (NORVASC) 5 MG tablet Take 1 tablet (5 mg total) by mouth daily.   aspirin EC 81 MG tablet Take 1 tablet (81 mg total) by mouth daily. Swallow whole.   atorvastatin (LIPITOR) 80 MG tablet Take 1 tablet (80 mg total) by mouth at bedtime.  Carboxymethylcellulose Sodium (ARTIFICIAL TEARS OP) Apply 1 Application to eye daily.   Cholecalciferol (VITAMIN D-3) 1000 UNITS CAPS Take 1 capsule by mouth daily.   empagliflozin (JARDIANCE) 10 MG TABS tablet Take 1 tablet (10 mg total) by mouth daily before breakfast.   folic acid (FOLVITE) A999333 MCG tablet Take 400 mcg by mouth daily.   furosemide (LASIX) 40 MG tablet Take one tablet daily. May take additional table as needed for weight gain of 2 pounds overnight or 5 pounds in one week.   HYDROcodone-acetaminophen  (NORCO) 7.5-325 MG tablet Take 1 tablet by mouth every 8 (eight) hours as needed.   LINZESS 72 MCG capsule Take 72 mcg by mouth every morning.   Magnesium 250 MG TABS Take 1 tablet by mouth daily.   Multiple Vitamins-Minerals (PRESERVISION/LUTEIN) CAPS Take 1 capsule by mouth 2 (two) times daily.   nystatin (MYCOSTATIN/NYSTOP) powder Apply 1 application  topically 2 (two) times daily.   Polyethyl Glycol-Propyl Glycol (SYSTANE OP) Place 1 drop into both eyes 2 (two) times daily.    polyethylene glycol (MIRALAX / GLYCOLAX) 17 g packet Take 17 g by mouth daily. (Patient taking differently: Take 17 g by mouth daily as needed for mild constipation.)   Povidone, PF, (IVIZIA DRY EYES) 0.5 % SOLN Place 1 drop into both eyes in the morning and at bedtime.   traZODone (DESYREL) 100 MG tablet Take 100 mg by mouth at bedtime.   venlafaxine (EFFEXOR-XR) 75 MG 24 hr capsule Take 75 mg by mouth daily with breakfast.   vitamin E 400 UNIT capsule Take 400 Units by mouth daily.   XANAX 0.25 MG tablet Take 0.25 mg by mouth every 8 (eight) hours as needed for anxiety.   XIIDRA 5 % SOLN Apply 1 drop to eye 2 (two) times daily.   [DISCONTINUED] metoprolol tartrate (LOPRESSOR) 25 MG tablet Take 0.5 tablets (12.5 mg total) by mouth 2 (two) times daily.     Allergies  Allergen Reactions   Other Other (See Comments)   Oxycodone Hcl Other (See Comments)   Tobramycin    Banana Nausea And Vomiting   Codeine Nausea And Vomiting and Other (See Comments)   Dilaudid [Hydromorphone] Other (See Comments)    Altered mental status   Oxycodone Hcl Nausea And Vomiting   Sausage [Pickled Meat] Nausea And Vomiting    Social History   Socioeconomic History   Marital status: Widowed    Spouse name: Not on file   Number of children: Not on file   Years of education: Not on file   Highest education level: Not on file  Occupational History   Not on file  Tobacco Use   Smoking status: Former    Packs/day: 1.00    Years:  30.00    Total pack years: 30.00    Types: Cigarettes    Quit date: 04/15/1998    Years since quitting: 24.1   Smokeless tobacco: Never  Vaping Use   Vaping Use: Never used  Substance and Sexual Activity   Alcohol use: No   Drug use: No   Sexual activity: Never    Birth control/protection: Surgical  Other Topics Concern   Not on file  Social History Narrative   Not on file   Social Determinants of Health   Financial Resource Strain: Not on file  Food Insecurity: No Food Insecurity (04/21/2022)   Hunger Vital Sign    Worried About Running Out of Food in the Last Year: Never true    Ran  Out of Food in the Last Year: Never true  Transportation Needs: No Transportation Needs (04/21/2022)   PRAPARE - Hydrologist (Medical): No    Lack of Transportation (Non-Medical): No  Physical Activity: Not on file  Stress: Not on file  Social Connections: Not on file  Intimate Partner Violence: Not At Risk (04/21/2022)   Humiliation, Afraid, Rape, and Kick questionnaire    Fear of Current or Ex-Partner: No    Emotionally Abused: No    Physically Abused: No    Sexually Abused: No     Review of Systems: General: negative for chills, fever, night sweats or weight changes.  Cardiovascular: negative for chest pain, dyspnea on exertion, edema, orthopnea, palpitations, paroxysmal nocturnal dyspnea or shortness of breath Dermatological: negative for rash Respiratory: negative for cough or wheezing Urologic: negative for hematuria Abdominal: negative for nausea, vomiting, diarrhea, bright red blood per rectum, melena, or hematemesis Neurologic: negative for visual changes, syncope, or dizziness All other systems reviewed and are otherwise negative except as noted above.    Blood pressure 132/64, pulse 63, height 5' 3"$  (1.6 m), weight 215 lb (97.5 kg), SpO2 93 %.  General appearance: alert and no distress Neck: no adenopathy, no carotid bruit, no JVD, supple,  symmetrical, trachea midline, and thyroid not enlarged, symmetric, no tenderness/mass/nodules Lungs: clear to auscultation bilaterally Heart: regular rate and rhythm, S1, S2 normal, no murmur, click, rub or gallop Extremities: 2+ pitting bilateral lower extremity edema Pulses: 2+ and symmetric Skin: Skin color, texture, turgor normal. No rashes or lesions Neurologic: Grossly normal  EKG not performed today  ASSESSMENT AND PLAN:   Mixed hyperlipidemia History of hyperlipidemia on high-dose statin therapy lipid profile performed 04/24/2022 revealing total cholesterol 92, LDL 43 and HDL 39.  Benign essential hypertension History of essential hypertension blood pressure measured today at 132/64.  She is on amlodipine, metoprolol.  Palpitations History of palpitations with recent event monitor performed 04/24/2022 revealing frequent PVCs with a 5.3% burden and 93 short runs of PSVT the longest being 18 seconds at 97 bpm.  She is on low-dose metoprolol 12.5 mg p.o. twice daily.  There is no A-fib noted.  I am going to increase her metoprolol from 12.5 to 25 mg p.o. twice daily.  Resting pulse today is 63.  Bilateral lower extremity edema Bilateral lower extremity edema on furosemide daily.  She does have diastolic dysfunction.  She has 2+ edema on exam today.  She weighs herself on a daily basis.  I told her that should her weight increase she can take an additional furosemide daily for 3 days and then go back to this.  She is acutely aware of salt restriction.  Chronic diastolic CHF (congestive heart failure) (HCC) On oral diuretics, aware of salt restriction.  Chest pain Patient was read only admitted to the hospital for 3 days 04/21/2022.  She underwent left heart cath by Dr. Irish Lack revealing mild nonobstructive CAD.  Chest CTA was negative for pulmonary embolism.  She has had no recurrent symptoms.     Lorretta Harp MD FACP,FACC,FAHA, [facility] 05/30/2022 10:15 AM

## 2022-05-30 NOTE — Assessment & Plan Note (Signed)
History of hyperlipidemia on high-dose statin therapy lipid profile performed 04/24/2022 revealing total cholesterol 92, LDL 43 and HDL 39.

## 2022-05-30 NOTE — Assessment & Plan Note (Signed)
History of essential hypertension blood pressure measured today at 132/64.  She is on amlodipine, metoprolol.

## 2022-05-30 NOTE — Patient Instructions (Signed)
Medication Instructions:  Your physician has recommended you make the following change in your medication:   -Increase metoprolol tartrate (lopressor) to 31m twice daily.  *If you need a refill on your cardiac medications before your next appointment, please call your pharmacy*   Follow-Up: [facility] our [facility].  As part of our continuing mission to provide you with exceptional heart care, we have created designated Provider Care Teams.  These Care Teams include your primary Cardiologist (physician) and Advanced Practice Providers (APPs -  Physician Assistants and Nurse Practitioners) who all work together to provide you with the care you need, when you need it.  We recommend signing up for the patient portal called "MyChart".  Sign up information is provided on this After Visit Summary.  MyChart is used to connect with patients for Virtual Visits (Telemedicine).  Patients are able to view lab/test results, encounter notes, upcoming appointments, etc.  Non-urgent messages can be sent to your provider as well.   To learn more about what you can do with MyChart, go to [email or web address]    Your next appointment:   12 month(s)  Provider:   JQuay Burow MD

## 2022-05-30 NOTE — Assessment & Plan Note (Signed)
Patient was read only admitted to the hospital for 3 days 04/21/2022.  She underwent left heart cath by Dr. Irish Lack revealing mild nonobstructive CAD.  Chest CTA was negative for pulmonary embolism.  She has had no recurrent symptoms.

## 2022-05-30 NOTE — Assessment & Plan Note (Signed)
Bilateral lower extremity edema on furosemide daily.  She does have diastolic dysfunction.  She has 2+ edema on exam today.  She weighs herself on a daily basis.  I told her that should her weight increase she can take an additional furosemide daily for 3 days and then go back to this.  She is acutely aware of salt restriction.

## 2022-05-30 NOTE — Assessment & Plan Note (Signed)
On oral diuretics, aware of salt restriction.

## 2022-06-06 ENCOUNTER — Other Ambulatory Visit (HOSPITAL_COMMUNITY): Payer: Self-pay

## 2022-06-09 ENCOUNTER — Emergency Department (HOSPITAL_COMMUNITY): Payer: Medicare Other

## 2022-06-09 ENCOUNTER — Emergency Department (HOSPITAL_COMMUNITY)
Admission: EM | Admit: 2022-06-09 | Discharge: 2022-06-09 | Disposition: A | Discharge status: Home / Self Care | Payer: Medicare Other | Attending: Emergency Medicine | Admitting: Emergency Medicine

## 2022-06-09 ENCOUNTER — Encounter (HOSPITAL_COMMUNITY): Payer: Self-pay

## 2022-06-09 DIAGNOSIS — W19XXXA Unspecified fall, initial encounter: Secondary | ICD-10-CM

## 2022-06-09 DIAGNOSIS — E1140 Type 2 diabetes mellitus with diabetic neuropathy, unspecified: Secondary | ICD-10-CM | POA: Insufficient documentation

## 2022-06-09 DIAGNOSIS — Y92002 Bathroom of unspecified non-institutional (private) residence single-family (private) house as the place of occurrence of the external cause: Secondary | ICD-10-CM | POA: Diagnosis not present

## 2022-06-09 DIAGNOSIS — M25561 Pain in right knee: Secondary | ICD-10-CM | POA: Diagnosis present

## 2022-06-09 DIAGNOSIS — Z7982 Long term (current) use of aspirin: Secondary | ICD-10-CM | POA: Diagnosis not present

## 2022-06-09 DIAGNOSIS — Z8542 Personal history of malignant neoplasm of other parts of uterus: Secondary | ICD-10-CM | POA: Insufficient documentation

## 2022-06-09 DIAGNOSIS — R519 Headache, unspecified: Secondary | ICD-10-CM | POA: Insufficient documentation

## 2022-06-09 DIAGNOSIS — Z79899 Other long term (current) drug therapy: Secondary | ICD-10-CM | POA: Insufficient documentation

## 2022-06-09 DIAGNOSIS — R600 Localized edema: Secondary | ICD-10-CM | POA: Insufficient documentation

## 2022-06-09 DIAGNOSIS — Z7984 Long term (current) use of oral hypoglycemic drugs: Secondary | ICD-10-CM | POA: Insufficient documentation

## 2022-06-09 DIAGNOSIS — W182XXA Fall in (into) shower or empty bathtub, initial encounter: Secondary | ICD-10-CM | POA: Insufficient documentation

## 2022-06-09 DIAGNOSIS — I10 Essential (primary) hypertension: Secondary | ICD-10-CM | POA: Diagnosis not present

## 2022-06-09 NOTE — Discharge Instructions (Signed)
Your workup today was overall reassuring.  No concerning findings on your x-ray, or CT scan of the head and neck.  Particularly no evidence of fracture.  You were able to ambulate within the emergency department without difficulty.  Take Tylenol as you need to for pain control.  Ice the knee for 15 minutes every 3-4 hours while you are awake.  For any concerning symptoms return to the emergency department otherwise follow-up with your primary care provider.
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
## 2022-06-09 NOTE — ED Notes (Signed)
Code stroke paged out at this time. Leslie Hendricks

## 2022-06-09 NOTE — ED Triage Notes (Signed)
Patient tripped on a pew at church . Hit head on the pew on th eright forehead. Denies LOC. She also hit her right knee, patient is able to bear weight with a walker that she uses but is complaining of pain in that knee

## 2022-06-12 ENCOUNTER — Ambulatory Visit (INDEPENDENT_AMBULATORY_CARE_PROVIDER_SITE_OTHER): Payer: Medicare Other | Admitting: Surgical

## 2022-06-12 ENCOUNTER — Ambulatory Visit: Payer: Self-pay

## 2022-06-12 DIAGNOSIS — M25511 Pain in right shoulder: Secondary | ICD-10-CM

## 2022-06-12 DIAGNOSIS — M25561 Pain in right knee: Secondary | ICD-10-CM

## 2022-06-12 DIAGNOSIS — M19011 Primary osteoarthritis, right shoulder: Secondary | ICD-10-CM | POA: Diagnosis not present
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
## 2022-06-13 ENCOUNTER — Other Ambulatory Visit: Payer: Self-pay

## 2022-06-13 ENCOUNTER — Encounter: Payer: Self-pay | Admitting: Surgical

## 2022-06-13 ENCOUNTER — Telehealth: Payer: Self-pay | Admitting: Surgical

## 2022-06-13 DIAGNOSIS — M25561 Pain in right knee: Secondary | ICD-10-CM

## 2022-06-13 MED ORDER — METHYLPREDNISOLONE ACETATE 40 MG/ML IJ SUSP
40.0000 mg | INTRAMUSCULAR | Status: AC | PRN
  Administered 2022-06-12: 40 mg via INTRA_ARTICULAR

## 2022-06-13 MED ORDER — BUPIVACAINE HCL 0.5 % IJ SOLN
9.0000 mL | INTRAMUSCULAR | Status: AC | PRN
  Administered 2022-06-12: 9 mL via INTRA_ARTICULAR

## 2022-06-13 MED ORDER — LIDOCAINE HCL 1 % IJ SOLN
5.0000 mL | INTRAMUSCULAR | Status: AC | PRN
  Administered 2022-06-12: 5 mL

## 2022-06-13 NOTE — Telephone Encounter (Signed)
[facility] calling to confirm orders on this patient please call Magda Paganini at --[phone] opt 1 followed by opt 4

## 2022-06-20 ENCOUNTER — Ambulatory Visit
Admission: RE | Admit: 2022-06-20 | Discharge: 2022-06-20 | Disposition: A | Discharge status: Home / Self Care | Payer: Medicare Other | Source: Ambulatory Visit | Attending: Surgical | Admitting: Surgical

## 2022-06-20 DIAGNOSIS — M25561 Pain in right knee: Secondary | ICD-10-CM

## 2022-06-25 ENCOUNTER — Telehealth: Payer: Self-pay | Admitting: Cardiovascular Disease

## 2022-06-25 DIAGNOSIS — Z79899 Other long term (current) drug therapy: Secondary | ICD-10-CM

## 2022-06-25 NOTE — Telephone Encounter (Signed)
Pt c/o swelling: STAT is pt has developed SOB within 24 hours  If swelling, where is the swelling located?   Both legs  How much weight have you gained and in what time span?   5lbs in a few days  Have you gained 3 pounds in a day or 5 pounds in a week?   Yes  Do you have a log of your daily weights (if so, list)?   Yes  Are you currently taking a fluid pill?   Yes  Are you currently SOB?   When moving around.  No worse within the last week or two  Have you traveled recently? No  Patient is concerned regarding her excess fluid. Patient has visit scheduled on 3/19.

## 2022-06-25 NOTE — Telephone Encounter (Signed)
Called pt she reports she gets up in the morning and her legs are swollen, reports increased SOB. Pt has taken Lasix 80 mg since Thursday or Friday. Today she weighed 215, yesterday 214, day before 211. She is taking all medication as prescribed. "I am not peeing a lot more." Talked to DOD (Dr. Sallyanne Kuster) and he ordered labs and advised pt to continue taking the double dose of Lasix until seen on Tuesday. Orders for labs placed.

## 2022-06-27 ENCOUNTER — Telehealth (HOSPITAL_BASED_OUTPATIENT_CLINIC_OR_DEPARTMENT_OTHER): Payer: Self-pay

## 2022-06-27 LAB — BASIC METABOLIC PANEL
BUN/Creatinine Ratio: 22 (ref 12–28)
BUN: 23 mg/dL (ref 8–27)
CO2: 19 mmol/L — ABNORMAL LOW (ref 20–29)
Calcium: 10 mg/dL (ref 8.7–10.3)
Chloride: 106 mmol/L (ref 96–106)
Creatinine, Ser: 1.05 mg/dL — ABNORMAL HIGH (ref 0.57–1.00)
Glucose: 100 mg/dL — ABNORMAL HIGH (ref 70–99)
Potassium: 5.1 mmol/L (ref 3.5–5.2)
Sodium: 140 mmol/L (ref 134–144)
eGFR: 53 mL/min/{1.73_m2} — ABNORMAL LOW (ref >59)

## 2022-06-27 NOTE — Telephone Encounter (Signed)
Copied from patient schedule request,   "We will take that 10:55 appt. She is holding fluid all over mostly in her tummy and both legs. She is up from 5-8 lbs. in her weight. She is taking two fluid pills a day. I have her an appt for her cellulitis on Fri. afternoon.  Thank you! [name]"   Routing to [name], NP for Fluid status update.

## 2022-06-27 NOTE — Telephone Encounter (Signed)
Labs yesterday showed stable kidney function, normal electrolytes. Does not appear BNP (which measures fluid volume) was collected.   Per discussion 06/25/22 her weight was decreasing. Can we get weights over the past 3-5 days? And any recent blood pressure readings?  If weight is decreasing, continue Lasix '40mg'$  BID until appt.  If weight is not decreasing, change to Lasix '80mg'$  ONCE daily x 2 days to increase diuretic response then return to '40mg'$  BID.  Loel Dubonnet, NP

## 2022-07-01 ENCOUNTER — Encounter (HOSPITAL_BASED_OUTPATIENT_CLINIC_OR_DEPARTMENT_OTHER): Payer: Self-pay | Admitting: Family

## 2022-07-01 ENCOUNTER — Ambulatory Visit (INDEPENDENT_AMBULATORY_CARE_PROVIDER_SITE_OTHER): Payer: Medicare Other | Admitting: Family

## 2022-07-01 VITALS — BP 112/60 | HR 60 | Ht 63.0 in | Wt 220.0 lb

## 2022-07-01 DIAGNOSIS — I10 Essential (primary) hypertension: Secondary | ICD-10-CM

## 2022-07-01 DIAGNOSIS — I25118 Atherosclerotic heart disease of native coronary artery with other forms of angina pectoris: Secondary | ICD-10-CM

## 2022-07-01 DIAGNOSIS — N1831 Chronic kidney disease, stage 3a: Secondary | ICD-10-CM

## 2022-07-01 DIAGNOSIS — I5032 Chronic diastolic (congestive) heart failure: Secondary | ICD-10-CM

## 2022-07-01 DIAGNOSIS — E785 Hyperlipidemia, unspecified: Secondary | ICD-10-CM | POA: Diagnosis not present

## 2022-07-01 DIAGNOSIS — R002 Palpitations: Secondary | ICD-10-CM | POA: Diagnosis not present

## 2022-07-01 DIAGNOSIS — I493 Ventricular premature depolarization: Secondary | ICD-10-CM
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
## 2022-07-01 NOTE — Patient Instructions (Addendum)
Medication Instructions:  Your physician has recommended you make the following change in your medication:   Today: take Furosemide 2 tablets at lunchtime  Tomorrow: take Furosemide 2 tablets in the morning and 1 tablet after lunch time  Wednesday: take Furosemide 2 tablets in the morning- Update [place] via mychart so we know how to continue medications!   Continue Potassium 43mEq twice daily  *If you need a refill on your cardiac medications before your next appointment, please call your pharmacy*   Lab Work: Your physician recommends that you return for lab work in: one week for BMP, BNP, CBC   If you have labs (blood work) drawn today and your tests are completely normal, you will receive your results only by: [place] (if you have MyChart) OR A paper copy in the mail If you have any lab test that is abnormal or we need to change your treatment, we will call you to review the results.  Follow-Up: At [facility], you and your health needs are our priority.  As part of our continuing mission to provide you with exceptional heart care, we have created designated Provider Care Teams.  These Care Teams include your primary Cardiologist (physician) and Advanced Practice Providers (APPs -  Physician Assistants and Nurse Practitioners) who all work together to provide you with the care you need, when you need it.  We recommend signing up for the patient portal called "MyChart".  Sign up information is provided on this After Visit Summary.  MyChart is used to connect with patients for Virtual Visits (Telemedicine).  Patients are able to view lab/test results, encounter notes, upcoming appointments, etc.  Non-urgent messages can be sent to your provider as well.   To learn more about what you can do with MyChart, go to [email or web address].    Your next appointment:   As scheduled  Other Instructions  Recommend weighing daily and keeping a log. Please call our office if you  have weight gain of 2 pounds overnight or 5 pounds in 1 week.   Date  Time Weight

## 2022-07-02 ENCOUNTER — Ambulatory Visit (HOSPITAL_BASED_OUTPATIENT_CLINIC_OR_DEPARTMENT_OTHER): Payer: Medicare Other | Admitting: Family

## 2022-07-03 ENCOUNTER — Encounter (HOSPITAL_BASED_OUTPATIENT_CLINIC_OR_DEPARTMENT_OTHER): Payer: Self-pay

## 2022-07-05 MED ORDER — TORSEMIDE 40 MG PO TABS: 40.0000 mg | ORAL_TABLET | Freq: Every day | ORAL | 3 refills | Status: DC

## 2022-07-05 MED ORDER — TORSEMIDE 20 MG PO TABS: 40.0000 mg | ORAL_TABLET | Freq: Every day | ORAL | 3 refills | Status: DC

## 2022-07-05 MED ORDER — TORSEMIDE 20 MG PO TABS: 20.0000 mg | ORAL_TABLET | Freq: Every day | ORAL | 3 refills | Status: DC

## 2022-07-05 NOTE — Telephone Encounter (Signed)
Recommend stop Furosemide. Start Torsemide 40mg  (two tablets of 20mg ) BID x 2 days then Torsemide 40mg  (two tablets of 40mg ) QD. Continue potassium.Repeat labs as scheduled.   Loel Dubonnet, NP

## 2022-07-05 NOTE — Addendum Note (Signed)
Addended by: Gerald Stabs on: 07/05/2022 01:41 PM   Modules accepted: Orders

## 2022-07-05 NOTE — Telephone Encounter (Signed)
Please advise, patient has not had labs done yet, as was just seen on 3/18

## 2022-07-05 NOTE — Addendum Note (Signed)
Addended by: Gerald Stabs on: 07/05/2022 01:38 PM   Modules accepted: Orders

## 2022-07-10 ENCOUNTER — Telehealth: Payer: Self-pay | Admitting: Family

## 2022-07-10 LAB — CBC
Hematocrit: 38.9 % (ref 34.0–46.6)
Hemoglobin: 12.3 g/dL (ref 11.1–15.9)
MCH: 31.9 pg (ref 26.6–33.0)
MCHC: 31.6 g/dL (ref 31.5–35.7)
MCV: 101 fL — ABNORMAL HIGH (ref 79–97)
Platelets: 238 10*3/uL (ref 150–450)
RBC: 3.86 x10E6/uL (ref 3.77–5.28)
RDW: 11.4 % — ABNORMAL LOW (ref 11.7–15.4)
WBC: 6.1 10*3/uL (ref 3.4–10.8)

## 2022-07-10 LAB — BASIC METABOLIC PANEL
BUN/Creatinine Ratio: 33 — ABNORMAL HIGH (ref 12–28)
BUN: 41 mg/dL — ABNORMAL HIGH (ref 8–27)
CO2: 26 mmol/L (ref 20–29)
Calcium: 9.6 mg/dL (ref 8.7–10.3)
Chloride: 98 mmol/L (ref 96–106)
Creatinine, Ser: 1.25 mg/dL — ABNORMAL HIGH (ref 0.57–1.00)
Glucose: 100 mg/dL — ABNORMAL HIGH (ref 70–99)
Potassium: 4.4 mmol/L (ref 3.5–5.2)
Sodium: 141 mmol/L (ref 134–144)
eGFR: 43 mL/min/{1.73_m2} — ABNORMAL LOW (ref >59)

## 2022-07-10 LAB — BRAIN NATRIURETIC PEPTIDE: BNP: 201.3 pg/mL — ABNORMAL HIGH (ref 0.0–100.0)

## 2022-07-10 NOTE — Telephone Encounter (Signed)
Basic metabolic panel Order: [id number] Status: Final result     Visible to patient: Yes (seen)     Dx: Coronary artery disease of native art...   3 Result Notes     1 Patient Communication     1 HM Topic          Component Ref Range & Units 1 d ago (07/09/22) 2 wk ago (06/26/22) 2 mo ago (05/02/22) 2 mo ago (04/24/22) 2 mo ago (04/23/22) 2 mo ago (04/23/22) 2 mo ago (04/21/22)  Glucose 70 - 99 mg/dL 100 High  100 High  90 101 High  CM 109 High  CM 95 CM 183 High  CM  BUN 8 - 27 mg/dL 41 High  23 37 High  24 High  R 23 R 27 High  R 42 High  R  Creatinine, Ser 0.57 - 1.00 mg/dL 1.25 High  1.05 High  1.34 High  1.24 High  R 1.24 High  R 1.21 High  R 1.19 High  R  eGFR >59 mL/min/1.73 43 Low  53 Low  39 Low       BUN/Creatinine Ratio 12 - 28 33 High  22 28      Sodium 134 - 144 mmol/L 141 140 142 137 R 140 R 139 R 136 R  Potassium 3.5 - 5.2 mmol/L 4.4 5.1 4.7 3.8 R 3.9 R 4.2 R 3.5 R  Chloride 96 - 106 mmol/L 98 106 102 103 R 106 R 107 R 98 R  CO2 20 - 29 mmol/L 26 19 Low  28 26 R 28 R 22 R 27 R  Calcium 8.7 - 10.3 mg/dL 9.6 10.0 9.8 8.2 Low  R 8.7 Low  R 8.7 Low  R 9.3 R  Resulting Agency [facility] [place] [facility] [facility]         Narrative Performed by: Maryan Puls Performed at:  [facility] [address] [place], [place]  [id number] Lab Director: Rush Farmer MD, Phone:  [phone]    Specimen Collected: 07/09/22 12:59 Last Resulted: 07/10/22 14:36      Lab Flowsheet      Order Details      View Encounter      Lab and Collection Details      Routing      Result History    View All Conversations on this Encounter      CM=Additional comments  R=Reference range differs from displayed range      Result Care Coordination   Result Notes   Francella Solian, RMA 07/10/2022  5:00 PM EDT Back to Top    See MyChart message 07/03/22---pt weight in the office was 220 at last OV. Weights at home have been ranging  209-212 with pajamas on. Please advise.   Loel Dubonnet, NP 07/10/2022  3:08 PM EDT     CBC no anemia nor infection.   BMP/BNP previously reviewed by Dr. Sallyanne Kuster: Kidney function is moderately abnormal, but unchanged over the last 18 months.  Potassium is OK. The blood counts are normal.   The BNP, measure of fluid overload, is mildly elevated, about twice her previous level from October 2022.   This suggests that she may still have a little too much fluid on board.  Has her weight come down to 306 pounds or less yet?

## 2022-07-10 NOTE — Telephone Encounter (Signed)
  Pt said, she received a call 5 mins ago. No notes on file but she did send mtychart message today

## 2022-07-10 NOTE — Telephone Encounter (Signed)
See MyChart encounter 07/03/22--have attempted to call pt x 2 with no answer.

## 2022-07-10 NOTE — Telephone Encounter (Signed)
-----   Message from Loel Dubonnet, NP sent at 07/10/2022  3:08 PM EDT ----- CBC no anemia nor infection.   BMP/BNP previously reviewed by Dr. Sallyanne Kuster: Kidney function is moderately abnormal, but unchanged over the last 18 months.  Potassium is OK. The blood counts are normal.   The BNP, measure of fluid overload, is mildly elevated, about twice her previous level from October 2022.  This suggests that she may still have a little too much fluid on board.  Has her weight come down to 306 pounds or less yet?

## 2022-07-11 NOTE — Telephone Encounter (Signed)
Addressed via [place] encounter.  Loel Dubonnet, NP

## 2022-07-11 NOTE — Telephone Encounter (Signed)
Goal home weight of 206 lbs. Her weight at home is improved from previous but not yet at goal. Recommend increase Torsemide to 3 tablets (60mg ) twice daily for 2 days and then return to 2 tablets (40mg ) twice per day.  Loel Dubonnet, NP

## 2022-08-01 ENCOUNTER — Ambulatory Visit (INDEPENDENT_AMBULATORY_CARE_PROVIDER_SITE_OTHER): Payer: Medicare Other | Admitting: Podiatry

## 2022-08-01 ENCOUNTER — Encounter: Payer: Self-pay | Admitting: Podiatry

## 2022-08-01 ENCOUNTER — Encounter (HOSPITAL_BASED_OUTPATIENT_CLINIC_OR_DEPARTMENT_OTHER): Payer: Self-pay | Admitting: Family

## 2022-08-01 ENCOUNTER — Ambulatory Visit (INDEPENDENT_AMBULATORY_CARE_PROVIDER_SITE_OTHER): Payer: Medicare Other | Admitting: Family

## 2022-08-01 VITALS — BP 120/61 | HR 57 | Ht 63.0 in | Wt 212.8 lb

## 2022-08-01 DIAGNOSIS — R0609 Other forms of dyspnea: Secondary | ICD-10-CM | POA: Diagnosis not present

## 2022-08-01 DIAGNOSIS — B351 Tinea unguium: Secondary | ICD-10-CM | POA: Diagnosis not present

## 2022-08-01 DIAGNOSIS — I10 Essential (primary) hypertension: Secondary | ICD-10-CM | POA: Diagnosis not present

## 2022-08-01 DIAGNOSIS — M722 Plantar fascial fibromatosis: Secondary | ICD-10-CM

## 2022-08-01 DIAGNOSIS — R002 Palpitations: Secondary | ICD-10-CM

## 2022-08-01 DIAGNOSIS — I493 Ventricular premature depolarization: Secondary | ICD-10-CM

## 2022-08-01 DIAGNOSIS — Z79899 Other long term (current) drug therapy: Secondary | ICD-10-CM

## 2022-08-01 DIAGNOSIS — M79676 Pain in unspecified toe(s): Secondary | ICD-10-CM | POA: Diagnosis not present

## 2022-08-01 DIAGNOSIS — D2372 Other benign neoplasm of skin of left lower limb, including hip: Secondary | ICD-10-CM | POA: Diagnosis not present

## 2022-08-01 DIAGNOSIS — N1831 Chronic kidney disease, stage 3a: Secondary | ICD-10-CM

## 2022-08-01 DIAGNOSIS — I25118 Atherosclerotic heart disease of native coronary artery with other forms of angina pectoris: Secondary | ICD-10-CM

## 2022-08-01 DIAGNOSIS — E785 Hyperlipidemia, unspecified: Secondary | ICD-10-CM | POA: Diagnosis not present

## 2022-08-01 DIAGNOSIS — D2371 Other benign neoplasm of skin of right lower limb, including hip: Secondary | ICD-10-CM

## 2022-08-01 DIAGNOSIS — I5032 Chronic diastolic (congestive) heart failure: Secondary | ICD-10-CM

## 2022-08-01 MED ORDER — TRIAMCINOLONE ACETONIDE 40 MG/ML IJ SUSP
20.0000 mg | Freq: Once | INTRAMUSCULAR | Status: AC
  Administered 2022-08-01: 20 mg
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
## 2022-08-01 NOTE — Progress Notes (Signed)
She presents today with her sister for chief complaint of painful feet bilaterally she says her left foot is just given her an absolute fit she states that think is the plantar fasciitis this bother me.  This great toe is also bothering me.  Objective: Vital signs are stable alert oriented x 3.  Pulses are palpable.  She has severe benign skin lesions beneath the hallux interphalangeal joint of the left foot and a solitary benign lesion in the center aspect of the plantar calcaneal tubercle of the left foot.  Neither of which are open neither of which demonstrate any signs of infection.  Her toenails are long thick yellow dystrophic onychomycotic.  Assessment: Pain in limb secondary to benign skin lesions bilateral foot left primarily.  Pain in limb secondary to painful nails bilaterally.  Plan: Discussed etiology pathology and surgical therapies at this point debrided toenails 1 through 5 carefully debrided benign skin lesions to their deepest level.  Placed padding.  And I will follow-up with her in about 2 months.

## 2022-08-01 NOTE — Patient Instructions (Signed)
Medication Instructions:  Your physician recommends that you continue on your current medications as directed. Please refer to the Current Medication list given to you today.  *If you need a refill on your cardiac medications before your next appointment, please call your pharmacy*   Lab Work: Your physician recommends that you return for lab work today- BMP and BNP   Follow-Up: At [facility], you and your health needs are our priority.  As part of our continuing mission to provide you with exceptional heart care, we have created designated Provider Care Teams.  These Care Teams include your primary Cardiologist (physician) and Advanced Practice Providers (APPs -  Physician Assistants and Nurse Practitioners) who all work together to provide you with the care you need, when you need it.  We recommend signing up for the patient portal called "MyChart".  Sign up information is provided on this After Visit Summary.  MyChart is used to connect with patients for Virtual Visits (Telemedicine).  Patients are able to view lab/test results, encounter notes, upcoming appointments, etc.  Non-urgent messages can be sent to your provider as well.   To learn more about what you can do with MyChart, go to [email or web address].    Your next appointment:   4-6 month(s)  Provider:   Nanetta Batty, MD  or Gillian Shields, NP

## 2022-08-02 LAB — BASIC METABOLIC PANEL
BUN/Creatinine Ratio: 26 (ref 12–28)
BUN: 36 mg/dL — ABNORMAL HIGH (ref 8–27)
CO2: 30 mmol/L — ABNORMAL HIGH (ref 20–29)
Calcium: 9.5 mg/dL (ref 8.7–10.3)
Chloride: 100 mmol/L (ref 96–106)
Creatinine, Ser: 1.39 mg/dL — ABNORMAL HIGH (ref 0.57–1.00)
Glucose: 98 mg/dL (ref 70–99)
Potassium: 4.5 mmol/L (ref 3.5–5.2)
Sodium: 145 mmol/L — ABNORMAL HIGH (ref 134–144)
eGFR: 38 mL/min/{1.73_m2} — ABNORMAL LOW (ref >59)

## 2022-08-02 LAB — BRAIN NATRIURETIC PEPTIDE: BNP: 187.1 pg/mL — ABNORMAL HIGH (ref 0.0–100.0)

## 2022-08-05 ENCOUNTER — Telehealth (HOSPITAL_BASED_OUTPATIENT_CLINIC_OR_DEPARTMENT_OTHER): Payer: Self-pay

## 2022-08-05 MED ORDER — TORSEMIDE 20 MG PO TABS: ORAL_TABLET | ORAL | 3 refills | Status: DC

## 2022-08-05 NOTE — Telephone Encounter (Addendum)
Results called to patient who verbalizes understanding! Rx to pharmacy and medication list updated!     ----- Message from Alver Sorrow, NP sent at 08/05/2022  4:11 PM EDT ----- BNP shows volume status improving. Overall stable kidney function.   May continue Torsemide 2 tablets in the morning and additional tablet in the afternoon as needed. Please provide updated Rx.  Potassium normal - she is no longer taking supplement and we do not need to resume.

## 2022-08-07 ENCOUNTER — Encounter (HOSPITAL_BASED_OUTPATIENT_CLINIC_OR_DEPARTMENT_OTHER): Payer: Self-pay | Admitting: Family

## 2022-10-31 ENCOUNTER — Encounter: Payer: Self-pay | Admitting: Podiatry

## 2022-10-31 ENCOUNTER — Ambulatory Visit (INDEPENDENT_AMBULATORY_CARE_PROVIDER_SITE_OTHER): Payer: Medicare Other | Admitting: Podiatry

## 2022-10-31 DIAGNOSIS — D2371 Other benign neoplasm of skin of right lower limb, including hip: Secondary | ICD-10-CM

## 2022-10-31 DIAGNOSIS — D2372 Other benign neoplasm of skin of left lower limb, including hip: Secondary | ICD-10-CM | POA: Diagnosis not present

## 2022-10-31 DIAGNOSIS — M79676 Pain in unspecified toe(s): Secondary | ICD-10-CM

## 2022-10-31 DIAGNOSIS — B351 Tinea unguium: Secondary | ICD-10-CM | POA: Diagnosis not present

## 2022-10-31 NOTE — Progress Notes (Signed)
She presents today chief complaint of painful elongated toenails 1 through 5 bilaterally.  She is also complaining of some painful calluses to the forefoot and left heel.  Objective: Vital signs stable oriented x 3 these porokeratotic lesions are exquisitely painful for her and are throbby and very hard to debride.  She states that is so exquisitely painful.  Toenails are long thick yellow dystrophic with mycotic.  Assessment: Pain limb secondary to benign skin lesions left foot as well as onychomycosis bilateral.  Plan: Debridement of onychomycosis debridement of benign skin lesions placed padding with salicylic acid to be left on for 3 days without washing off.  She will wash this off tomorrow morning follow-up with me in 3 months

## 2022-11-28 ENCOUNTER — Ambulatory Visit (INDEPENDENT_AMBULATORY_CARE_PROVIDER_SITE_OTHER): Payer: Medicare Other | Admitting: Pulmonary Disease

## 2022-11-28 ENCOUNTER — Encounter: Payer: Self-pay | Admitting: Pulmonary Disease

## 2022-11-28 VITALS — BP 124/70 | HR 52 | Ht 63.0 in | Wt 214.0 lb

## 2022-11-28 DIAGNOSIS — G4733 Obstructive sleep apnea (adult) (pediatric): Secondary | ICD-10-CM

## 2022-11-28 DIAGNOSIS — I5032 Chronic diastolic (congestive) heart failure: Secondary | ICD-10-CM | POA: Diagnosis not present

## 2022-11-28 NOTE — Assessment & Plan Note (Signed)
CPAP download was reviewed which shows average pressure of 14.5 cm on auto settings 10 to 15 cm with residual AHI of 6.6, few centrals.  On certain nights residual AHI is as high as 10 Acceptable outcome since she is symptomatically better.  She does have leak especially in the last week related to mask getting old  Weight loss encouraged, compliance with goal of at least 4-6 hrs every night is the expectation. Advised against medications with sedative side effects Cautioned against driving when sleepy - understanding that sleepiness will vary on a day to day basis

## 2022-11-28 NOTE — Progress Notes (Signed)
   Subjective:    Patient ID: Leslie Hendricks, female    DOB: 08-12-1940, 82 y.o.   MRN: [id number]  HPI  82 yo woman for follow-up of OSA   PMH - Hypertension Diabetes Uterine cancer COVID infection 11/2020 associated with fall and bilateral lower extremity popliteal DVT, took Eliquis for 3 months Bilateral lower extremity edema, chronic diastolic heart failure, on Lasix 40 twice daily  2-month follow-up visit. 04/2022 download showed residual AHI of 7/7 with few central apneas.  She had significant mask leak into her eyes  She seems to have settled down.  She is accompanied by her brother today.  She is resting better.  She is able to use CPAP for the full night.  Still complains of some leak that she was complaining about last time.denies any problems with pressure  She is on 40 mg of torsemide daily  Significant tests/ events reviewed   HST 05/2021 AHI 18/h, low sat 72% CPAP 06/2021 >> 15 cm     CT chest without contrast 03/2015 no nodule noted  Review of Systems neg for any significant sore throat, dysphagia, itching, sneezing, nasal congestion or excess/ purulent secretions, fever, chills, sweats, unintended wt loss, pleuritic or exertional cp, hempoptysis, orthopnea pnd or change in chronic leg swelling. Also denies presyncope, palpitations, heartburn, abdominal pain, nausea, vomiting, diarrhea or change in bowel or urinary habits, dysuria,hematuria, rash, arthralgias, visual complaints, headache, numbness weakness or ataxia.     Objective:   Physical Exam  Gen. Pleasant, obese, in no distress ENT - no lesions, no post nasal drip Neck: No JVD, no thyromegaly, no carotid bruits Lungs: no use of accessory muscles, no dullness to percussion, decreased without rales or rhonchi  Cardiovascular: Rhythm regular, heart sounds  normal, no murmurs or gallops, no peripheral edema Musculoskeletal: No deformities, no cyanosis or clubbing , no tremors       Assessment & Plan:

## 2022-11-28 NOTE — Assessment & Plan Note (Signed)
Continue toresemide

## 2022-11-28 NOTE — Patient Instructions (Signed)
CPAP is working well on her current settings. CPAP supplies will be renewed for a year

## 2022-12-19 ENCOUNTER — Ambulatory Visit (INDEPENDENT_AMBULATORY_CARE_PROVIDER_SITE_OTHER): Payer: Medicare Other | Admitting: Family

## 2022-12-19 ENCOUNTER — Encounter (HOSPITAL_BASED_OUTPATIENT_CLINIC_OR_DEPARTMENT_OTHER): Payer: Self-pay | Admitting: Family

## 2022-12-19 VITALS — BP 110/52 | HR 56 | Ht 63.0 in | Wt 215.0 lb

## 2022-12-19 DIAGNOSIS — N1831 Chronic kidney disease, stage 3a: Secondary | ICD-10-CM | POA: Diagnosis not present

## 2022-12-19 DIAGNOSIS — I10 Essential (primary) hypertension: Secondary | ICD-10-CM

## 2022-12-19 DIAGNOSIS — I5032 Chronic diastolic (congestive) heart failure: Secondary | ICD-10-CM | POA: Diagnosis not present

## 2022-12-19 DIAGNOSIS — I493 Ventricular premature depolarization: Secondary | ICD-10-CM

## 2022-12-19 DIAGNOSIS — E782 Mixed hyperlipidemia: Secondary | ICD-10-CM

## 2022-12-19 NOTE — Patient Instructions (Addendum)
Medication Instructions:  Your physician has recommended you make the following change in your medication:   Change: Torsemide 40mg  daily in the morning and 20mg  daily for the next 3 days ( between 12-2)   *If you need a refill on your cardiac medications before your next appointment, please call your pharmacy*   Follow-Up: At [facility], you and your health needs are our priority.  As part of our continuing mission to provide you with exceptional heart care, we have created designated Provider Care Teams.  These Care Teams include your primary Cardiologist (physician) and Advanced Practice Providers (APPs -  Physician Assistants and Nurse Practitioners) who all work together to provide you with the care you need, when you need it.  We recommend signing up for the patient portal called "MyChart".  Sign up information is provided on this After Visit Summary.  MyChart is used to connect with patients for Virtual Visits (Telemedicine).  Patients are able to view lab/test results, encounter notes, upcoming appointments, etc.  Non-urgent messages can be sent to your provider as well.   To learn more about what you can do with MyChart, go to [email or web address].    Your next appointment:   February with Dr. Allyson Sabal   Other Instructions Exercises to do While Sitting Warm-up Before starting other exercises: Sit up as straight as you can. Have your knees bent at 90 degrees, which is the shape of the capital letter "L." Keep your feet flat on the floor. Sit at the front edge of your chair, if you can. Pull in (tighten) the muscles in your abdomen and stretch your spine and neck as straight as you can. Hold this position for a few minutes. Breathe in and out evenly. Try to concentrate on your breathing, and relax your mind.  Stretching Exercise A: Arm stretch Hold your arms out straight in front of your body. Bend your hands at the wrist with your fingers pointing up, as if signaling  someone to stop. Notice the slight tension in your forearms as you hold the position. Keeping your arms out and your hands bent, rotate your hands outward as far as you can and hold this stretch. Aim to have your thumbs pointing up and your pinkie fingers pointing down. Slowly repeat arm stretches for one minute as tolerated. Exercise B: Leg stretch If you can move your legs, try to "draw" letters on the floor with the toes of your foot. Write your name with one foot. Write your name with the toes of your other foot. Slowly repeat the movements for one minute as tolerated. Exercise C: Reach for the sky Reach your hands as far over your head as you can to stretch your spine. Move your hands and arms as if you are climbing a rope. Slowly repeat the movements for one minute as tolerated.  Range of motion exercises Exercise A: Shoulder roll Let your arms hang loosely at your sides. Lift just your shoulders up toward your ears, then let them relax back down. When your shoulders feel loose, rotate your shoulders in backward and forward circles. Do shoulder rolls slowly for one minute as tolerated. Exercise B: March in place As if you are marching, pump your arms and lift your legs up and down. Lift your knees as high as you can. If you are unable to lift your knees, just pump your arms and move your ankles and feet up and down. March in place for one minute as tolerated. Exercise C: Seated  jumping jacks Let your arms hang down straight. Keeping your arms straight, lift them up over your head. Aim to point your fingers to the ceiling. While you lift your arms, straighten your legs and slide your heels along the floor to your sides, as wide as you can. As you bring your arms back down to your sides, slide your legs back together. If you are unable to use your legs, just move your arms. Slowly repeat seated jumping jacks for one minute as tolerated.  Strengthening exercises Exercise A: Shoulder  squeeze Hold your arms straight out from your body to your sides, with your elbows bent and your fists pointed at the ceiling. Keeping your arms in the bent position, move them forward so your elbows and forearms meet in front of your face. Open your arms back out as wide as you can with your elbows still bent, until you feel your shoulder blades squeezing together. Hold for 5 seconds. Slowly repeat the movements forward and backward for one minute as tolerated.

## 2022-12-19 NOTE — Progress Notes (Signed)
Cardiology Office Note:  .   Date:  12/19/2022  ID:  Leslie Hendricks, DOB 1940-07-01, MRN [id number] PCP: Leslie Presto, MD  [facility] Providers Cardiologist:  Leslie Batty, MD    History of Present Illness: .   Leslie Hendricks is a 82 y.o. female  with a hx of PAD, nonobstructive CAD, HLD, HTN, PVC, bilateral LE DVT (completed Eliquis course), GERD, DM2, obesity, arthritis, CKDIII, malignant neoplasm of uterus, tobaccuse use, frequent falls, COVID19 06/2020 requiring hospitalization. Strong family history of cardiovascular disease.   Prior ABI 2012 with 0.98 ABI on right and 0.77 on left. Echocardiogram 2020 normal LVEF 60-65%, mildly elevated RVSP 39.21mmHg, mild mitral valve calcification, moderate aortic sclerosis. Nuclear stress test 2020 normal with no ischemia.    Admitted 06/2020 with COVID19 with bilateral popliteal DVT and completed 6 month course Eliquis. Repeat venous doppler 11/2021 with no DVT. Carotid duplex 06/2021 with no carotid stenosis.    Seen by Leslie Dory, NP 01/11/22 with subsequent echo 01/29/22 with LVEF 60-65%, no RWMA, gr2DD, moderately elevated PASP, mild to moderate TR, aortic sclerosis without stenosis, RA pressure 15 mmHg. At follow-up 02/13/2022 losartan transitioned to Leslie Hendricks.     Admitted 1/7-1/10/24 with chest pain. LHC with mild to moderate CAD and RCA with no culprit lesion. Lasix was decreased to daily. Leslie Hendricks was discontinued as Jardiance initiated. ZIO monitor placed to quanitfy PVC burden.  It revealed 5.3% PVC burden and 93 short runs of PSVT. At visit 05/30/22 her Metoprolol was increased to 25mg  QD.    Seen 07/01/2022-recently completed course of Keflex for cellulitis and started on doxycycline for persistent cellulitis.  Ultimately not noticed any response to Lasix and she was transitioned to torsemide. At follow up 08/01/22 volume status improved on Torsemide and 40mg  AM with 20mg  PRN in the afternoon continued.    Presents today for  follow-up independently. Completing a course of doxyxcyline for recurrent LE cellulitis per PCP. Weight up 3 lbs from dry weight. Exertional dyspnea stable at baseline.  Does dry to keep her feet up for swelling but not wearing compression socks. No chest pain. She did take the extra dose Torsemide Tuesday night.   Lab 11/14/22 via [facility] creatinine 1.31, BUN 38, K 4.2, AST 25, ALT 24, Alkaline phosphatase 149 Lipid panel 11/14/22: total cholesterol 101, triglycerides 86, LDL 37, HDL 45 Pro BNP 1058  ROS: Please see the history of present illness.    All other systems reviewed and are negative.   Studies Reviewed: Marland Kitchen   EKG Interpretation Date/Time:  Thursday December 19 2022 13:27:42 EDT Ventricular Rate:  56 PR Interval:  186 QRS Duration:  76 QT Interval:  424 QTC Calculation: 409 R Axis:   49  Text Interpretation: Sinus bradycardia with Premature atrial complexes Confirmed by Leslie Hendricks ([id number]) on 12/19/2022 1:30:47 PM    Cardiac Studies & Procedures   CARDIAC CATHETERIZATION  CARDIAC CATHETERIZATION 04/23/2022  Narrative   Prox RCA to Mid RCA lesion is 25% stenosed.   Mid LAD lesion is 40-50% stenosed.   The left ventricular systolic function is normal.   LV end diastolic pressure is normal.   The left ventricular ejection fraction is 55-65% by visual estimate.   There is no aortic valve stenosis.   Vasospasm noted in the right radial.  If emergent cath was needed, would consider femoral approach.  Mild to moderate CAD noted in the LAD and RCA.  No culprit lesion identified for her chest pain.  Findings  Coronary Findings Diagnostic  Dominance: Right  Left Anterior Descending Mid LAD lesion is 40% stenosed. The lesion is eccentric.  Left Circumflex The vessel exhibits minimal luminal irregularities.  Right Coronary Artery Prox RCA to Mid RCA lesion is 25% stenosed.  Intervention  No interventions have been documented.   STRESS TESTS  MYOCARDIAL  PERFUSION IMAGING 01/06/2019  Narrative  There was no ST segment deviation noted during stress.  The left ventricular ejection fraction is hyperdynamic (>65%).  Nuclear stress EF: 68%.  The study is normal.  This is a low risk study.   ECHOCARDIOGRAM  ECHOCARDIOGRAM COMPLETE 01/29/2022  Narrative ECHOCARDIOGRAM REPORT    [name] Date of Exam: 01/29/2022 Medical Rec #:  [id number]        Height:       63.0 in Accession #:    [id number]       Weight:       218.2 lb Date of Birth:  06-22-1940        BSA:          2.007 m Patient Age:    89 years         BP:           130/58 mmHg Patient Gender: F                HR:           62 bpm. Exam Location:  Outpatient  Procedure: 2D Echo, 3D Echo, Color Doppler, Cardiac Doppler and Strain Analysis  Indications:    R06.9 DOE  History:        Patient has prior history of Echocardiogram examinations, most recent 12/30/2018. Arrythmias:PVC, Signs/Symptoms:Murmur and Dyspnea; Risk Factors:Former Smoker, Dyslipidemia, Diabetes and Hypertension. Covid 19.  Sonographer:    Leslie Hendricks RDCS Referring Phys: [id number] Leslie Hendricks Leslie Hendricks  IMPRESSIONS   1. Left ventricular ejection fraction, by estimation, is 60 to 65%. Left ventricular ejection fraction by 3D volume is 66 %. The left ventricle has normal function. The left ventricle has no regional wall motion abnormalities. Left ventricular diastolic parameters are consistent with Grade II diastolic dysfunction (pseudonormalization). The average left ventricular global longitudinal strain is -20.0 %. The global longitudinal strain is normal. 2. Right ventricular systolic function is normal. The right ventricular size is normal. There is moderately elevated pulmonary artery systolic pressure. The estimated right ventricular systolic pressure is 58.6 mmHg. 3. The mitral valve is grossly normal. No evidence of mitral valve regurgitation. No evidence of mitral  stenosis. 4. Tricuspid valve regurgitation is mild to moderate. Best visualize in the 4 chamber assessment of the intraatrial septum. 5. The aortic valve was not well visualized. There is moderate calcification of the aortic valve. There is moderate thickening of the aortic valve. Aortic valve regurgitation is not visualized. Aortic valve sclerosis/calcification is present, without any evidence of aortic stenosis. 6. The inferior vena cava is dilated in size with <50% respiratory variability, suggesting right atrial pressure of 15 mmHg.  Comparison(Leslie): Prior images reviewed side by side. Triccuspid regurgitation increased from prior.  FINDINGS Left Ventricle: Left ventricular ejection fraction, by estimation, is 60 to 65%. Left ventricular ejection fraction by 3D volume is 66 %. The left ventricle has normal function. The left ventricle has no regional wall motion abnormalities. The average left ventricular global longitudinal strain is -20.0 %. The global longitudinal strain is normal. The left ventricular internal cavity size was normal in size. There is no left ventricular hypertrophy. Left  ventricular diastolic parameters are consistent with Grade II diastolic dysfunction (pseudonormalization).  Right Ventricle: The right ventricular size is normal. No increase in right ventricular wall thickness. Right ventricular systolic function is normal. There is moderately elevated pulmonary artery systolic pressure. The tricuspid regurgitant velocity is 3.30 m/Leslie, and with an assumed right atrial pressure of 15 mmHg, the estimated right ventricular systolic pressure is 58.6 mmHg.  Left Atrium: Left atrial size was normal in size.  Right Atrium: Right atrial size was normal in size.  Pericardium: There is no evidence of pericardial effusion.  Mitral Valve: The mitral valve is grossly normal. Mild mitral annular calcification. No evidence of mitral valve regurgitation. No evidence of mitral valve  stenosis.  Tricuspid Valve: The tricuspid valve is normal in structure. Tricuspid valve regurgitation is mild to moderate. No evidence of tricuspid stenosis.  Aortic Valve: The aortic valve was not well visualized. There is moderate calcification of the aortic valve. There is moderate thickening of the aortic valve. Aortic valve regurgitation is not visualized. Aortic valve sclerosis/calcification is present, without any evidence of aortic stenosis.  Pulmonic Valve: The pulmonic valve was not well visualized. Pulmonic valve regurgitation is not visualized. No evidence of pulmonic stenosis.  Aorta: The aortic root and ascending aorta are structurally normal, with no evidence of dilitation.  Venous: The inferior vena cava is dilated in size with less than 50% respiratory variability, suggesting right atrial pressure of 15 mmHg.  IAS/Shunts: The atrial septum is grossly normal.   LEFT VENTRICLE PLAX 2D LVIDd:         4.77 cm         Diastology LVIDs:         2.71 cm         LV e' medial:    6.64 cm/Leslie LV PW:         1.05 cm         LV E/e' medial:  14.8 LV IVS:        0.99 cm         LV e' lateral:   8.92 cm/Leslie LVOT diam:     1.80 cm         LV E/e' lateral: 11.0 LV SV:         63 LV SV Index:   32              2D LVOT Area:     2.54 cm        Longitudinal Strain 2D Strain GLS  -20.0 % Avg:  3D Volume EF LV 3D EF:    Left ventricul ar ejection fraction by 3D volume is 66 %.  3D Volume EF: 3D EF:        66 % LV EDV:       177 ml LV ESV:       60 ml LV SV:        118 ml  RIGHT VENTRICLE RV Basal diam:  4.51 cm RV Mid diam:    3.09 cm RV Leslie prime:     10.10 cm/Leslie TAPSE (M-mode): 2.4 cm  LEFT ATRIUM             Index        RIGHT ATRIUM           Index LA diam:        4.50 cm 2.24 cm/m   RA Area:     20.90 cm LA Vol (A2C):   47.0 ml 23.42 ml/m  RA Volume:   56.80 ml  28.31 ml/m LA Vol (A4C):   55.2 ml 27.51 ml/m LA Biplane Vol: 53.4 ml 26.61 ml/m AORTIC  VALVE LVOT Vmax:   95.90 cm/Leslie LVOT Vmean:  63.400 cm/Leslie LVOT VTI:    0.249 m  AORTA Ao Root diam: 2.60 cm Ao Asc diam:  2.80 cm  MITRAL VALVE               TRICUSPID VALVE MV Area (PHT): 3.37 cm    TR Peak grad:   43.6 mmHg MV Decel Time: 225 msec    TR Vmax:        330.00 cm/Leslie MR Peak grad: 71.9 mmHg MR Vmax:      424.00 cm/Leslie  SHUNTS MV E velocity: 98.00 cm/Leslie  Systemic VTI:  0.25 m MV A velocity: 52.90 cm/Leslie  Systemic Diam: 1.80 cm MV E/A ratio:  1.85  Riley Lam MD Electronically signed by Riley Lam MD Signature Date/Time: 01/29/2022/12:32:50 PM    Final    MONITORS  LONG TERM MONITOR (3-14 DAYS) 05/20/2022  Narrative Patch Wear Time:  13 days and 10 hours (2024-01-13T09:48:17-0500 to 2024-01-26T19:50:12-498)  Patient had a min HR of 38 bpm, max HR of 160 bpm, and avg HR of 54 bpm. Predominant underlying rhythm was Sinus Rhythm. 1 run of Ventricular Tachycardia occurred lasting 4 beats with a max rate of 148 bpm (avg 133 bpm). 93 Supraventricular Tachycardia runs occurred, the run with the fastest interval lasting 6 beats with a max rate of 160 bpm, the longest lasting 18.3 secs with an avg rate of 97 bpm. Isolated SVEs were rare (<1.0%), SVE Couplets were rare (<1.0%), and SVE Triplets were rare (<1.0%). Isolated VEs were frequent (5.3%, [id number]), VE Couplets were rare (<1.0%, 78), and no VE Triplets were present. Ventricular Bigeminy and Trigeminy were present  SR/SB Freq PVCs (5.3% burden). Occasional short runs of NSVT 93 short runs of PSVT (longest 18 seconds at 97 BPM)           Risk Assessment/Calculations:             Physical Exam:   VS:  BP (!) 110/52   Pulse (!) 56   Ht 5\' 3"  (1.6 m)   Wt 215 lb (97.5 kg)   BMI 38.09 kg/m    Wt Readings from Last 3 Encounters:  12/19/22 215 lb (97.5 kg)  11/28/22 214 lb (97.1 kg)  08/01/22 212 lb 12.8 oz (96.5 kg)    GEN: Well nourished, overweight, well developed in no acute distress NECK: No  JVD; No carotid bruits CARDIAC: RRR, no murmurs, rubs, gallops RESPIRATORY:  Clear to auscultation without rales, wheezing or rhonchi  ABDOMEN: Soft, non-tender, non-distended EXTREMITIES:  Bilateral LE with 2+ pitting edema and mild erythema; No deformity   ASSESSMENT AND PLAN: .   CAD -nonobstructive disease by [facility] 04/2022.  No recurrent chest pain there was question of coronary spasm at time of LHC.  Continue aspirin, statin, metoprolol, amlodipine.   HFpEF-weight up 3 pounds from dry weight.  Echo 01/2019 LVEF 60 to 65%, grade 2 diastolic dysfunction.  Continue GDMT Jardiance,  metoprolol, Torsemide. Given recently treated for cellulitis with PCP - given LE edema will increase Torsemide to 40mg  AM and 20mg  in the afternoon for 3 days then return to 40mg  AM with 20mg  PRN In the afternoon. Entresto previously discontinued for protection of renal function and to avoid hypotension.  Hesitant to add MRA due to relative hypotension. Low sodium diet, fluid  restriction <2L, and daily weights encouraged. Educated to contact our office for weight gain of 2 lbs overnight or 5 lbs in one week.     PVC / Atrial tachycardia-did not tolerate amiodarone due to bradycardia. EKG today SB with PAC, reports occasional palpitations.  ZIO 05/2022 with no atrial fib and PVC burden 5.3%.  Continue metoprolol 25 mg twice daily.   HTN - BP well controlled. Continue current antihypertensive regimen.  Relatively hypotensive today but asymptomatic with no lightheadedness, dizziness.    PAD / HLD / Carotid artery stenosis - 06/2021 carotid duplex 1-39% stenosis bilaterally. Repeat as clinically indicated. Continue Atorvastatin 80mg  daily. Denies myalgias, claudication.    CKD 3 - Careful titration of diuretic and antihypertensive. Baseline creatinine 1.2-1.3, stable on labs 11/14/22 with PCP.  Obesity - Given exercises to perform while sitting to facilitate weight loss. Weight loss via diet and exercise encouraged. Discussed  the impact being overweight would have on cardiovascular risk.         Dispo: follow up in 6 months  Signed, Alver Sorrow, NP

## 2023-01-30 ENCOUNTER — Ambulatory Visit (INDEPENDENT_AMBULATORY_CARE_PROVIDER_SITE_OTHER): Payer: Medicare Other | Admitting: Podiatrist

## 2023-01-30 DIAGNOSIS — M79676 Pain in unspecified toe(s): Secondary | ICD-10-CM | POA: Diagnosis not present

## 2023-01-30 DIAGNOSIS — D2372 Other benign neoplasm of skin of left lower limb, including hip: Secondary | ICD-10-CM

## 2023-01-30 DIAGNOSIS — B351 Tinea unguium: Secondary | ICD-10-CM
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
## 2023-01-31 ENCOUNTER — Encounter: Payer: Self-pay | Admitting: Podiatrist

## 2023-03-05 ENCOUNTER — Ambulatory Visit (INDEPENDENT_AMBULATORY_CARE_PROVIDER_SITE_OTHER): Payer: Medicare Other | Admitting: Orthopedic Surgery

## 2023-03-05 ENCOUNTER — Other Ambulatory Visit (INDEPENDENT_AMBULATORY_CARE_PROVIDER_SITE_OTHER): Payer: Self-pay

## 2023-03-05 ENCOUNTER — Other Ambulatory Visit: Payer: Self-pay

## 2023-03-05 DIAGNOSIS — M25551 Pain in right hip: Secondary | ICD-10-CM

## 2023-03-05 DIAGNOSIS — M13851 Other specified arthritis, right hip: Secondary | ICD-10-CM

## 2023-03-07 ENCOUNTER — Encounter: Payer: Self-pay | Admitting: Orthopedic Surgery
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
## 2023-03-09 MED ORDER — LIDOCAINE HCL 1 % IJ SOLN
5.0000 mL | INTRAMUSCULAR | Status: AC | PRN
Start: 2023-03-05 — End: 2023-03-05
  Administered 2023-03-05: 5 mL

## 2023-03-09 MED ORDER — BUPIVACAINE HCL 0.25 % IJ SOLN
4.0000 mL | INTRAMUSCULAR | Status: AC | PRN
Start: 2023-03-05 — End: 2023-03-05
  Administered 2023-03-05: 4 mL via INTRA_ARTICULAR

## 2023-03-09 MED ORDER — METHYLPREDNISOLONE ACETATE 80 MG/ML IJ SUSP
80.0000 mg | INTRAMUSCULAR | Status: AC | PRN
Start: 2023-03-05 — End: 2023-03-05
  Administered 2023-03-05: 80 mg via INTRA_ARTICULAR

## 2023-03-10 ENCOUNTER — Other Ambulatory Visit: Payer: Self-pay | Admitting: Radiology

## 2023-03-11 LAB — SURGICAL PATHOLOGY

## 2023-04-01 ENCOUNTER — Telehealth: Payer: Self-pay | Admitting: Orthopedic Surgery

## 2023-04-01 NOTE — Telephone Encounter (Signed)
 Scheduled for work in

## 2023-04-01 NOTE — Telephone Encounter (Signed)
Patient called. She fell and injured her foot. Would like to see August Saucer this week.

## 2023-04-02 ENCOUNTER — Ambulatory Visit (INDEPENDENT_AMBULATORY_CARE_PROVIDER_SITE_OTHER): Payer: Medicare Other | Admitting: Orthopedic Surgery

## 2023-04-02 DIAGNOSIS — M25572 Pain in left ankle and joints of left foot: Secondary | ICD-10-CM

## 2023-04-03 ENCOUNTER — Encounter: Payer: Self-pay | Admitting: Orthopedic Surgery
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
## 2023-04-14 ENCOUNTER — Other Ambulatory Visit: Payer: Self-pay

## 2023-04-14 ENCOUNTER — Encounter (HOSPITAL_COMMUNITY): Payer: Self-pay | Admitting: *Deleted

## 2023-04-14 ENCOUNTER — Emergency Department (HOSPITAL_COMMUNITY): Payer: Medicare Other

## 2023-04-14 ENCOUNTER — Emergency Department (HOSPITAL_COMMUNITY)
Admission: EM | Admit: 2023-04-14 | Discharge: 2023-04-14 | Disposition: A | Discharge status: Home / Self Care | Payer: Medicare Other | Attending: Emergency Medicine | Admitting: Emergency Medicine

## 2023-04-14 DIAGNOSIS — M542 Cervicalgia: Secondary | ICD-10-CM | POA: Insufficient documentation

## 2023-04-14 DIAGNOSIS — Z79899 Other long term (current) drug therapy: Secondary | ICD-10-CM | POA: Diagnosis not present

## 2023-04-14 DIAGNOSIS — W010XXA Fall on same level from slipping, tripping and stumbling without subsequent striking against object, initial encounter: Secondary | ICD-10-CM | POA: Diagnosis not present

## 2023-04-14 DIAGNOSIS — S0003XA Contusion of scalp, initial encounter: Secondary | ICD-10-CM | POA: Diagnosis not present

## 2023-04-14 DIAGNOSIS — Z7982 Long term (current) use of aspirin: Secondary | ICD-10-CM | POA: Diagnosis not present

## 2023-04-14 DIAGNOSIS — S5001XA Contusion of right elbow, initial encounter: Secondary | ICD-10-CM | POA: Insufficient documentation

## 2023-04-14 DIAGNOSIS — S0990XA Unspecified injury of head, initial encounter: Secondary | ICD-10-CM | POA: Diagnosis present

## 2023-04-14 DIAGNOSIS — W19XXXA Unspecified fall, initial encounter: Secondary | ICD-10-CM

## 2023-04-14 DIAGNOSIS — I10 Essential (primary) hypertension: Secondary | ICD-10-CM | POA: Diagnosis not present

## 2023-04-14 DIAGNOSIS — E119 Type 2 diabetes mellitus without complications: Secondary | ICD-10-CM | POA: Diagnosis not present

## 2023-04-14 MED ORDER — ACETAMINOPHEN 325 MG PO TABS
650.0000 mg | ORAL_TABLET | Freq: Once | ORAL | Status: AC
  Administered 2023-04-14: 650 mg via ORAL
  Filled 2023-04-14: qty 2

## 2023-04-14 NOTE — Discharge Instructions (Signed)
As we discussed, your workup in the ER today was reassuring for acute findings.  CT imaging of your head and neck did not reveal any emergent concerns.  You will likely experience muscle spasms, muscle aches, and bruising as a result of these injuries.  Ultimately these injuries will take time to heal.  Rest, hydration, gentle exercise and stretching will aid in recovery from his injuries.    Using medication such as Tylenol will help alleviate pain as well as decrease swelling and inflammation associated with these injuries.   You will likely feel far more achy and painful tomorrow morning.  This is to be expected.  Salt water/Epson salt soaks, massage, icy hot/Biofreeze/BenGay and other similar products can help with symptoms.  I recommend calling your primary doctor to schedule close follow-up appointment.  Please return to the emergency department for reevaluation if you denies any new or concerning symptoms.

## 2023-04-14 NOTE — ED Notes (Signed)
Patient returned from CT

## 2023-04-14 NOTE — ED Triage Notes (Signed)
Pt BIB [facility] for a fall, knot to posterior head. Denies any LOC , denies any blood thinners.

## 2023-04-14 NOTE — ED Notes (Signed)
Pt stated she fell at home and hit head. Denies LOC. A&O x4. Able to communicate clearly. No bleeding seen. Bump felt on back of head. Pt stated she is only having acute pain where she hit head.

## 2023-04-21 ENCOUNTER — Other Ambulatory Visit: Payer: Self-pay | Admitting: Cardiovascular Disease

## 2023-04-21 ENCOUNTER — Other Ambulatory Visit (HOSPITAL_BASED_OUTPATIENT_CLINIC_OR_DEPARTMENT_OTHER): Payer: Self-pay | Admitting: Family

## 2023-04-30 ENCOUNTER — Ambulatory Visit: Payer: Medicare Other | Admitting: Orthopedic Surgery

## 2023-05-06 ENCOUNTER — Ambulatory Visit: Payer: Medicare Other | Admitting: Podiatry

## 2023-05-08 ENCOUNTER — Other Ambulatory Visit: Payer: Self-pay

## 2023-05-09 LAB — SURGICAL PATHOLOGY

## 2023-05-15 ENCOUNTER — Encounter: Payer: Self-pay | Admitting: Podiatry

## 2023-05-15 ENCOUNTER — Ambulatory Visit (INDEPENDENT_AMBULATORY_CARE_PROVIDER_SITE_OTHER): Payer: Medicare Other | Admitting: Podiatry

## 2023-05-15 DIAGNOSIS — B351 Tinea unguium: Secondary | ICD-10-CM | POA: Diagnosis not present

## 2023-05-15 DIAGNOSIS — D2372 Other benign neoplasm of skin of left lower limb, including hip: Secondary | ICD-10-CM | POA: Diagnosis not present

## 2023-05-15 DIAGNOSIS — K5900 Constipation, unspecified: Secondary | ICD-10-CM | POA: Insufficient documentation

## 2023-05-15 DIAGNOSIS — D2371 Other benign neoplasm of skin of right lower limb, including hip: Secondary | ICD-10-CM

## 2023-05-15 DIAGNOSIS — M79676 Pain in unspecified toe(s): Secondary | ICD-10-CM | POA: Diagnosis not present

## 2023-05-15 NOTE — Progress Notes (Signed)
She presents today chief complaint of painful toenails and calluses bilateral.  States that her feet just hurt all the time.  Objective: Vitals are stable alert oriented x 3 pulses are palpable no open lesions or wounds.  Capillary fill time is immediate.  Neurological Sorum is intact toenails are long thick yellow dystrophic onychomycotic multiple reactive hyperkeratotic lesions plantar aspect of the bilateral forefoot and toes.  Assessment: Pain limb secondary to onychomycosis and benign skin lesions.  Plan: Debridement of skin lesion debridement of toenails 1 through 5 bilateral.

## 2023-05-19 ENCOUNTER — Ambulatory Visit: Payer: Medicare Other | Attending: Cardiovascular Disease | Admitting: Cardiovascular Disease

## 2023-05-19 ENCOUNTER — Encounter: Payer: Self-pay | Admitting: Cardiovascular Disease

## 2023-05-19 VITALS — BP 128/80 | HR 52 | Ht 62.5 in | Wt 212.0 lb

## 2023-05-19 DIAGNOSIS — I2510 Atherosclerotic heart disease of native coronary artery without angina pectoris: Secondary | ICD-10-CM | POA: Diagnosis present

## 2023-05-19 DIAGNOSIS — R002 Palpitations: Secondary | ICD-10-CM

## 2023-05-19 DIAGNOSIS — E782 Mixed hyperlipidemia: Secondary | ICD-10-CM | POA: Diagnosis present

## 2023-05-19 DIAGNOSIS — I10 Essential (primary) hypertension: Secondary | ICD-10-CM | POA: Diagnosis not present

## 2023-05-19 DIAGNOSIS — R600 Localized edema: Secondary | ICD-10-CM | POA: Diagnosis present

## 2023-05-19 NOTE — Progress Notes (Unsigned)
05/19/2023 CATHRINE KRIZAN   [birth year]  [id number]  Primary Physician Corrington, Meredith Mody, MD Primary Cardiologist: Runell Gess MD Milagros Loll, [place], [place]  HPI:  Leslie Hendricks is a 83 y.o.   mildly overweight, widowed Caucasian female with no children who lives alone.  She is accompanied by her Sister Leslie Hendricks today.  Her husband of 51 years died on 2009/04/04. She has retired from the [employer]. I last saw her in the office 05/30/2022. Her risk factors include 45 pack-year history of tobacco abuse, having quit 12 years ago; treated hypertension; diabetes; and hyperlipidemia. She does have a strong family history of heart disease. Both parents had bypass surgery. Three of her siblings have myocardial infractions. She had a negative pharmacologic stress test on July 03, 2010. She denies chest pain or shortness of breath. Her major complaints are of lack of stability on her legs, but she really denies claudication. She walks with the aid of a walker. She had arterial Dopplers performed in our office in March of 2012 revealing ABI of 0.98 on the right and 0.77 on the left. She has a very limited functional existence from bed to chair. She rarely gets out of the house. Since I saw her back over one year ago she's had no cardiovascular complaints.she was evaluated by Dr. Tyrone Sage because of the lung nodule that was not metabolic and therefore the decision was made to follow this conservatively. She apparently needs a knee replacement and was referred back here for cardiovascular clearance.  She had her left total knee replacement 2016.  I saw her over a year ago she now walks with a walker but still is has limited mobility. She gets occasional atypical chest pain and has complained of some palpitations since her rotator cuff surgery in October of last year. I performed a 30 day event monitor which showed only unifocal PVCs.   She had been complaining of increasing dyspnea on exertion.  She  was placed on a diuretic because of lower extreme edema by her PCP.  Her last Myoview performed 07/03/2010 was nonischemic and 2D echo performed 2016 showed normal LV function.  We did perform 2D echocardiography September 2020 as well as a Myoview stress test all of which were unremarkable.   She  was hospitalized in July for COVID.  She had a difficult post hospital course.  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  She does live alone but ambulates with a walker.     She was hospital for 3 days on 04/21/2022 for chest pain.  Chest CT was negative for pulmonary embolism.  Left heart cath by Dr. Eldridge Dace showed minimal nonobstructive CAD.  She was discharged home on 04/24/2018.  She did have an event monitor performed 1/that 10/24 revealing frequent PVCs with a 5.3% burden and 93 short runs of PSVT.  She is on low-dose beta-blocker.  She is on oral diuretic for lower extremity edema and chronic diastolic heart failure with instructions to double the dose should she have increasing lower extremity edema.  Since I saw her a year ago she has remained stable.  She appears euvolemic today.  She is on torsemide.  She denies chest pain.   Current Meds  Medication Sig   amLODipine (NORVASC) 5 MG tablet Take 1 tablet by mouth once daily   aspirin EC 81 MG tablet Take 1 tablet (  81 mg total) by mouth daily. Swallow whole.   atorvastatin (LIPITOR) 80 MG tablet Take 1 tablet (80 mg total) by mouth at bedtime.   Carboxymethylcellulose Sodium (ARTIFICIAL TEARS OP) Apply 1 Application to eye daily.   Cholecalciferol (VITAMIN D-3) 1000 UNITS CAPS Take 1 capsule by mouth daily.   empagliflozin (JARDIANCE) 10 MG TABS tablet TAKE 1 TABLET BY MOUTH ONCE DAILY BEFORE BREAKFAST   folic acid (FOLVITE) 400 MCG tablet Take 400 mcg by mouth daily.   HYDROcodone-acetaminophen (NORCO) 7.5-325 MG tablet Take 1 tablet by mouth  every 8 (eight) hours as needed.   LINZESS 72 MCG capsule Take 72 mcg by mouth every morning.   Magnesium 250 MG TABS Take 1 tablet by mouth daily.   metoprolol tartrate (LOPRESSOR) 25 MG tablet Take 1 tablet by mouth twice daily   Multiple Vitamins-Minerals (PRESERVISION/LUTEIN) CAPS Take 1 capsule by mouth 2 (two) times daily.   nystatin (MYCOSTATIN/NYSTOP) powder Apply 1 application  topically 2 (two) times daily.   polyethylene glycol (MIRALAX / GLYCOLAX) 17 g packet Take 17 g by mouth daily. (Patient taking differently: Take 17 g by mouth daily as needed for mild constipation.)   torsemide (DEMADEX) 20 MG tablet Take 2 tablets in the morning and an extra tablet in the evening as needed for swelling, weight gain of 2 pounds overnight or 5 pounds in one week.   traZODone (DESYREL) 100 MG tablet Take 100 mg by mouth at bedtime.   venlafaxine (EFFEXOR-XR) 75 MG 24 hr capsule Take 75 mg by mouth daily with breakfast.   vitamin E 400 UNIT capsule Take 400 Units by mouth daily.   XANAX 0.25 MG tablet Take 0.25 mg by mouth every 8 (eight) hours as needed for anxiety.   [DISCONTINUED] doxycycline (VIBRA-TABS) 100 MG tablet Take 100 mg by mouth 2 (two) times daily.     Allergies  Allergen Reactions   Other Other (See Comments)   Oxycodone Hcl Other (See Comments)   Tobramycin    Banana Nausea And Vomiting   Codeine Nausea And Vomiting and Other (See Comments)   Dilaudid [Hydromorphone] Other (See Comments)    Altered mental status   Oxycodone Hcl Nausea And Vomiting   Sausage [Pickled Meat] Nausea And Vomiting    Social History   Socioeconomic History   Marital status: Widowed    Spouse name: Not on file   Number of children: Not on file   Years of education: Not on file   Highest education level: Not on file  Occupational History   Not on file  Tobacco Use   Smoking status: Former    Current packs/day: 0.00    Average packs/day: 1 pack/day for 30.0 years (30.0 ttl pk-yrs)     Types: Cigarettes    Start date: 04/15/1968    Quit date: 04/15/1998    Years since quitting: 25.1   Smokeless tobacco: Never  Vaping Use   Vaping status: Never Used  Substance and Sexual Activity   Alcohol use: No   Drug use: No   Sexual activity: Never    Birth control/protection: Surgical  Other Topics Concern   Not on file  Social History Narrative   Not on file   Social Drivers of Health   Financial Resource Strain: High Risk (12/21/2022)   Received from [facility]   Overall Financial Resource Strain (CARDIA)    Difficulty of Paying Living Expenses: Very hard  Food Insecurity: Food Insecurity Present (12/21/2022)   Received from [facility]  Hunger Vital Sign    Worried About Running Out of Food in the Last Year: Sometimes true    Ran Out of Food in the Last Year: Never true  Transportation Needs: No Transportation Needs (12/21/2022)   Received from [facility] - Transportation    Lack of Transportation (Medical): No    Lack of Transportation (Non-Medical): No  Physical Activity: Unknown (12/21/2022)   Received from [facility]   Exercise Vital Sign    Days of Exercise per Week: 0 days    Minutes of Exercise per Session: Not on file  Stress: No Stress Concern Present (12/21/2022)   Received from [facility] - Occupational Stress Questionnaire    Feeling of Stress : Only a little  Social Connections: Socially Integrated (12/21/2022)   Received from [facility]   Social Network    How would you rate your social network (family, work, friends)?: Good participation with social networks  Intimate Partner Violence: Not At Risk (12/21/2022)   Received from [facility]   HITS    Over the last 12 months how often did your partner physically hurt you?: Never    Over the last 12 months how often did your partner insult you or talk down to you?: Never    Over the last 12 months how often did your partner threaten you with  physical harm?: Never    Over the last 12 months how often did your partner scream or curse at you?: Never     Review of Systems: General: negative for chills, fever, night sweats or weight changes.  Cardiovascular: negative for chest pain, dyspnea on exertion, edema, orthopnea, palpitations, paroxysmal nocturnal dyspnea or shortness of breath Dermatological: negative for rash Respiratory: negative for cough or wheezing Urologic: negative for hematuria Abdominal: negative for nausea, vomiting, diarrhea, bright red blood per rectum, melena, or hematemesis Neurologic: negative for visual changes, syncope, or dizziness All other systems reviewed and are otherwise negative except as noted above.    Blood pressure (!) 164/60, pulse (!) 52, height 5' 2.5" (1.588 m), weight 212 lb (96.2 kg), SpO2 95%.  General appearance: alert and no distress Neck: no adenopathy, no carotid bruit, no JVD, supple, symmetrical, trachea midline, and thyroid not enlarged, symmetric, no tenderness/mass/nodules Lungs: clear to auscultation bilaterally Heart: regular rate and rhythm, S1, S2 normal, no murmur, click, rub or gallop Extremities: extremities normal, atraumatic, no cyanosis or edema Pulses: 2+ and symmetric Skin: Skin color, texture, turgor normal. No rashes or lesions Neurologic: Grossly normal  EKG not performed today      ASSESSMENT AND PLAN:   Mixed hyperlipidemia History of hyperlipidemia on high-dose atorvastatin with lipid profile performed 04/24/2022 revealing total cholesterol of 92, LDL 43 and HDL 39.  Benign essential hypertension History of essential hypertension her blood pressure measured today at 164/60.  She is on amlodipine and metoprolol.  Palpitations History of palpitations with event monitor that showed 3.4% PVC burden and 93 short runs of SVT.  Her metoprolol was uptitrated.  Peripheral edema History of peripheral edema most likely related to diastolic dysfunction on  torsemide.  She appears euvolemic on exam.  She has been instructed to take extra torsemide should she have increasing shortness of breath, weight gain or swelling.  Coronary artery disease History of nonobstructive CAD by cath performed by Dr. Eldridge Dace 04/21/2022.     Runell Gess MD FACP,FACC,FAHA, [facility] 05/19/2023 2:07 PM

## 2023-05-19 NOTE — Assessment & Plan Note (Signed)
History of nonobstructive CAD by cath performed by Dr. Eldridge Dace 04/21/2022.

## 2023-05-19 NOTE — Assessment & Plan Note (Signed)
History of palpitations with event monitor that showed 3.4% PVC burden and 93 short runs of SVT.  Her metoprolol was uptitrated.

## 2023-05-19 NOTE — Assessment & Plan Note (Signed)
History of essential hypertension her blood pressure measured today at 164/60.  She is on amlodipine and metoprolol.

## 2023-05-19 NOTE — Assessment & Plan Note (Signed)
History of hyperlipidemia on high-dose atorvastatin with lipid profile performed 04/24/2022 revealing total cholesterol of 92, LDL 43 and HDL 39.

## 2023-05-19 NOTE — Assessment & Plan Note (Signed)
History of peripheral edema most likely related to diastolic dysfunction on torsemide.  She appears euvolemic on exam.  She has been instructed to take extra torsemide should she have increasing shortness of breath, weight gain or swelling.

## 2023-05-19 NOTE — Patient Instructions (Addendum)
Medication Instructions:  Your physician recommends that you continue on your current medications as directed. Please refer to the Current Medication list given to you today.  *If you need a refill on your cardiac medications before your next appointment, please call your pharmacy*    Follow-Up: At [facility], you and your health needs are our priority.  As part of our continuing mission to provide you with exceptional heart care, we have created designated Provider Care Teams.  These Care Teams include your primary Cardiologist (physician) and Advanced Practice Providers (APPs -  Physician Assistants and Nurse Practitioners) who all work together to provide you with the care you need, when you need it.  We recommend signing up for the patient portal called "MyChart".  Sign up information is provided on this After Visit Summary.  MyChart is used to connect with patients for Virtual Visits (Telemedicine).  Patients are able to view lab/test results, encounter notes, upcoming appointments, etc.  Non-urgent messages can be sent to your provider as well.   To learn more about what you can do with MyChart, go to [email or web address].    Your next appointment:   6 month(s)  Provider:   Gillian Shields, NP       Then, Nanetta Batty, MD will plan to see you again in 12 month(s).    Other Instructions

## 2023-06-04 ENCOUNTER — Ambulatory Visit: Payer: Medicare Other | Admitting: Orthopedic Surgery

## 2023-06-18 ENCOUNTER — Ambulatory Visit (INDEPENDENT_AMBULATORY_CARE_PROVIDER_SITE_OTHER): Payer: Medicare Other | Admitting: Orthopedic Surgery

## 2023-06-18 ENCOUNTER — Other Ambulatory Visit (INDEPENDENT_AMBULATORY_CARE_PROVIDER_SITE_OTHER): Payer: Self-pay

## 2023-06-18 ENCOUNTER — Other Ambulatory Visit: Payer: Self-pay

## 2023-06-18 DIAGNOSIS — M25512 Pain in left shoulder: Secondary | ICD-10-CM | POA: Diagnosis not present

## 2023-06-18 DIAGNOSIS — M13851 Other specified arthritis, right hip: Secondary | ICD-10-CM | POA: Diagnosis not present

## 2023-06-18 DIAGNOSIS — G8929 Other chronic pain: Secondary | ICD-10-CM

## 2023-06-18 DIAGNOSIS — M25551 Pain in right hip: Secondary | ICD-10-CM | POA: Diagnosis not present
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
## 2023-06-22 ENCOUNTER — Encounter: Payer: Self-pay | Admitting: Orthopedic Surgery

## 2023-06-22 MED ORDER — BUPIVACAINE HCL 0.25 % IJ SOLN
4.0000 mL | INTRAMUSCULAR | Status: AC | PRN
Start: 2023-06-18 — End: 2023-06-18
  Administered 2023-06-18: 4 mL via INTRA_ARTICULAR

## 2023-06-22 MED ORDER — LIDOCAINE HCL 1 % IJ SOLN
5.0000 mL | INTRAMUSCULAR | Status: AC | PRN
  Administered 2023-06-18: 5 mL

## 2023-06-22 MED ORDER — METHYLPREDNISOLONE ACETATE 80 MG/ML IJ SUSP
80.0000 mg | INTRAMUSCULAR | Status: AC | PRN
  Administered 2023-06-18: 80 mg via INTRA_ARTICULAR

## 2023-08-14 ENCOUNTER — Ambulatory Visit (INDEPENDENT_AMBULATORY_CARE_PROVIDER_SITE_OTHER): Payer: PRIVATE HEALTH INSURANCE | Admitting: Podiatry

## 2023-08-14 ENCOUNTER — Ambulatory Visit: Payer: Medicare Other | Admitting: Podiatry

## 2023-08-14 ENCOUNTER — Encounter: Payer: Self-pay | Admitting: Podiatry

## 2023-08-14 DIAGNOSIS — B351 Tinea unguium: Secondary | ICD-10-CM

## 2023-08-14 DIAGNOSIS — D2372 Other benign neoplasm of skin of left lower limb, including hip: Secondary | ICD-10-CM | POA: Diagnosis not present

## 2023-08-14 DIAGNOSIS — D2371 Other benign neoplasm of skin of right lower limb, including hip: Secondary | ICD-10-CM | POA: Diagnosis not present

## 2023-08-14 DIAGNOSIS — M79676 Pain in unspecified toe(s): Secondary | ICD-10-CM

## 2023-08-14 NOTE — Progress Notes (Signed)
 She presents today chief complaint of painful toenails and calluses bilateral.  States that her feet just hurt all the time.  Objective: Vitals are stable alert oriented x 3 pulses are palpable no open lesions or wounds.  Capillary fill time is immediate.  Neurological Sorum is intact toenails are long thick yellow dystrophic onychomycotic multiple reactive hyperkeratotic lesions plantar aspect of the bilateral forefoot and toes.  Assessment: Pain limb secondary to onychomycosis and benign skin lesions.  Plan: Debridement of skin lesion debridement of toenails 1 through 5 bilateral.

## 2023-11-13 ENCOUNTER — Ambulatory Visit (INDEPENDENT_AMBULATORY_CARE_PROVIDER_SITE_OTHER): Admitting: Orthopedic Surgery

## 2023-11-13 ENCOUNTER — Other Ambulatory Visit: Payer: Self-pay

## 2023-11-13 DIAGNOSIS — M13851 Other specified arthritis, right hip: Secondary | ICD-10-CM

## 2023-11-13 DIAGNOSIS — M25551 Pain in right hip: Secondary | ICD-10-CM

## 2023-11-14 ENCOUNTER — Encounter: Payer: Self-pay | Admitting: Orthopedic Surgery

## 2023-11-14 MED ORDER — TRIAMCINOLONE ACETONIDE 40 MG/ML IJ SUSP
40.0000 mg | INTRAMUSCULAR | Status: AC | PRN
  Administered 2023-11-13: 40 mg via INTRA_ARTICULAR

## 2023-11-14 MED ORDER — LIDOCAINE HCL 1 % IJ SOLN
5.0000 mL | INTRAMUSCULAR | Status: AC | PRN
  Administered 2023-11-13: 5 mL
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
## 2023-11-18 ENCOUNTER — Ambulatory Visit: Admitting: Podiatry

## 2023-11-20 ENCOUNTER — Ambulatory Visit: Admitting: Podiatry

## 2023-11-20 DIAGNOSIS — D2371 Other benign neoplasm of skin of right lower limb, including hip: Secondary | ICD-10-CM

## 2023-11-20 DIAGNOSIS — B351 Tinea unguium: Secondary | ICD-10-CM

## 2023-11-20 DIAGNOSIS — D2372 Other benign neoplasm of skin of left lower limb, including hip: Secondary | ICD-10-CM | POA: Diagnosis not present

## 2023-11-20 DIAGNOSIS — M79676 Pain in unspecified toe(s): Secondary | ICD-10-CM

## 2023-11-20 NOTE — Progress Notes (Signed)
 She presents today chief complaint of painful toenails and calluses bilateral.  States that her feet just hurt all the time.  Objective: Vitals are stable alert oriented x 3 pulses are palpable no open lesions or wounds.  Capillary fill time is immediate.  Neurological Sorum is intact toenails are long thick yellow dystrophic onychomycotic multiple reactive hyperkeratotic lesions plantar aspect of the bilateral forefoot and toes.  Assessment: Pain limb secondary to onychomycosis and benign skin lesions.  Plan: Debridement of skin lesion debridement of toenails 1 through 5 bilateral.

## 2023-12-02 ENCOUNTER — Other Ambulatory Visit: Payer: Self-pay | Admitting: Family

## 2023-12-03 ENCOUNTER — Telehealth: Payer: Self-pay | Admitting: Cardiovascular Disease

## 2023-12-03 MED ORDER — TORSEMIDE 20 MG PO TABS: ORAL_TABLET | ORAL | 1 refills | Status: AC

## 2023-12-03 NOTE — Telephone Encounter (Signed)
 RX sent in

## 2023-12-03 NOTE — Telephone Encounter (Signed)
*  STAT* If patient is at the pharmacy, call can be transferred to refill team.   1. Which medications need to be refilled? (please list name of each medication and dose if known)  torsemide  (DEMADEX ) 20 MG tablet  2. Which pharmacy/location (including street and city if local pharmacy) is medication to be sent to? [facility] - [place], [place] - [address]   3. Do they need a 30 day or 90 day supply?  90 day supply

## 2024-01-22 ENCOUNTER — Other Ambulatory Visit (HOSPITAL_BASED_OUTPATIENT_CLINIC_OR_DEPARTMENT_OTHER): Payer: Self-pay | Admitting: Family

## 2024-02-02 ENCOUNTER — Other Ambulatory Visit (HOSPITAL_BASED_OUTPATIENT_CLINIC_OR_DEPARTMENT_OTHER): Payer: Self-pay | Admitting: Family

## 2024-02-09 ENCOUNTER — Encounter (HOSPITAL_BASED_OUTPATIENT_CLINIC_OR_DEPARTMENT_OTHER): Payer: Self-pay

## 2024-02-10 ENCOUNTER — Encounter (HOSPITAL_BASED_OUTPATIENT_CLINIC_OR_DEPARTMENT_OTHER): Payer: Self-pay | Admitting: Family

## 2024-02-10 ENCOUNTER — Ambulatory Visit (INDEPENDENT_AMBULATORY_CARE_PROVIDER_SITE_OTHER): Admitting: Family

## 2024-02-10 VITALS — BP 124/78 | HR 49 | Ht 62.5 in | Wt 215.8 lb

## 2024-02-10 DIAGNOSIS — I2510 Atherosclerotic heart disease of native coronary artery without angina pectoris: Secondary | ICD-10-CM

## 2024-02-10 DIAGNOSIS — E782 Mixed hyperlipidemia: Secondary | ICD-10-CM

## 2024-02-10 DIAGNOSIS — R001 Bradycardia, unspecified: Secondary | ICD-10-CM

## 2024-02-10 DIAGNOSIS — I493 Ventricular premature depolarization: Secondary | ICD-10-CM | POA: Diagnosis not present

## 2024-02-10 DIAGNOSIS — I10 Essential (primary) hypertension: Secondary | ICD-10-CM | POA: Diagnosis not present

## 2024-02-10 MED ORDER — METOPROLOL SUCCINATE ER 25 MG PO TB24: 12.5000 mg | ORAL_TABLET | Freq: Every day | ORAL | 1 refills | Status: AC

## 2024-02-10 NOTE — Patient Instructions (Signed)
 Medication Instructions:  STOP MEtoprolol  Tartrate  START Metoprolol  Succinate HALF tablet (12.5mg ) once per day *If you need a refill on your cardiac medications before your next appointment, please call your pharmacy*   Follow-Up: At [facility], you and your health needs are our priority.  As part of our continuing mission to provide you with exceptional heart care, our providers are all part of one team.  This team includes your primary Cardiologist (physician) and Advanced Practice Providers or APPs (Physician Assistants and Nurse Practitioners) who all work together to provide you with the care you need, when you need it.  Your next appointment:   In 6 months with Dr. Court or Reche GORMAN Finder, NP   We recommend signing up for the patient portal called MyChart.  Sign up information is provided on this After Visit Summary.  MyChart is used to connect with patients for Virtual Visits (Telemedicine).  Patients are able to view lab/test results, encounter notes, upcoming appointments, etc.  Non-urgent messages can be sent to your provider as well.   To learn more about what you can do with MyChart, go to [email or web address].

## 2024-02-10 NOTE — Progress Notes (Signed)
 Cardiology Office Note:  .   Date:  02/10/2024  ID:  Tilton [name], DOB 01-Sep-1940, MRN [id number] PCP: Bobbette Coye DELENA, MD  [facility] Providers Cardiologist:  Dorn Lesches, MD    History of Present Illness: .   Leslie Hendricks is a 83 y.o. female  with a hx of PAD, nonobstructive CAD, HLD, HTN, PVC, bilateral LE DVT (completed Eliquis  course), GERD, DM2, obesity, arthritis, CKDIII, malignant neoplasm of uterus, tobaccuse use, frequent falls, COVID19 06/2020 requiring hospitalization. Strong family history of cardiovascular disease.   Prior ABI 2012 with 0.98 ABI on right and 0.77 on left. Echocardiogram 2020 normal LVEF 60-65%, mildly elevated RVSP 39.1mmHg, mild mitral valve calcification, moderate aortic sclerosis. Nuclear stress test 2020 normal with no ischemia.    Admitted 06/2020 with COVID19 with bilateral popliteal DVT and completed 6 month course Eliquis . Repeat venous doppler 11/2021 with no DVT. Carotid duplex 06/2021 with no carotid stenosis.    Seen by Almarie Crate, NP 01/11/22 with subsequent echo 01/29/22 with LVEF 60-65%, no RWMA, gr2DD, moderately elevated PASP, mild to moderate TR, aortic sclerosis without stenosis, RA pressure 15 mmHg. At follow-up 02/13/2022 losartan  transitioned to Entresto .     Admitted 1/7-1/10/24 with chest pain. LHC with mild to moderate CAD and RCA with no culprit lesion. Lasix  was decreased to daily. Entreso was discontinued as Jardiance  initiated. ZIO monitor placed to quanitfy PVC burden.  It revealed 5.3% PVC burden and 93 short runs of PSVT. At visit 05/30/22 her Metoprolol  was increased to 25mg  QD.    Seen 07/01/2022-recently completed course of Keflex for cellulitis and started on doxycycline for persistent cellulitis.  Ultimately not noticed any response to Lasix  and she was transitioned to torsemide . At follow up 08/01/22 volume status improved on Torsemide  and 40mg  AM with 20mg  PRN in the afternoon continued.   Last seen 05/19/23 by  Dr. Lesches. Doing overall well from a cardiac perspective. No changes were made.   Presents today for follow up with her sister. Notes some fatigue, discussed possible impact of bradycardia. Reports no shortness of breath at rest and stable, mild dyspnea on exertion. Reports no chest pain, pressure, or tightness. No  orthopnea, PND. Her LE edema is stable at baseline, not requiring extra doses of Torsemide . Upcoming visit 04/2023 to establish with [facility] for venous reflux workup. Reports occasional palpitations a couple times per month lasting only minutes which are overall not bothersome.   ROS: Please see the history of present illness.    All other systems reviewed and are negative.   Studies Reviewed: SABRA   EKG Interpretation Date/Time:  Tuesday February 10 2024 14:50:19 EDT Ventricular Rate:  49 PR Interval:  162 QRS Duration:  62 QT Interval:  432 QTC Calculation: 390 R Axis:   40  Text Interpretation: Sinus bradycardia with marked sinus arrhythmia  No acute ST/T wave changes Confirmed by Vannie Mora ([id number]) on 02/10/2024 4:14:02 PM    Cardiac Studies & Procedures   ______________________________________________________________________________________________ CARDIAC CATHETERIZATION  CARDIAC CATHETERIZATION 04/23/2022  Conclusion   Prox RCA to Mid RCA lesion is 25% stenosed.   Mid LAD lesion is 40-50% stenosed.   The left ventricular systolic function is normal.   LV end diastolic pressure is normal.   The left ventricular ejection fraction is 55-65% by visual estimate.   There is no aortic valve stenosis.   Vasospasm noted in the right radial.  If emergent cath was needed, would consider femoral approach.  Mild to moderate CAD noted  in the LAD and RCA.  No culprit lesion identified for her chest pain.  Findings Coronary Findings Diagnostic  Dominance: Right  Left Anterior Descending Mid LAD lesion is 40% stenosed. The lesion is eccentric.  Left Circumflex The vessel  exhibits minimal luminal irregularities.  Right Coronary Artery Prox RCA to Mid RCA lesion is 25% stenosed.  Intervention  No interventions have been documented.   STRESS TESTS  MYOCARDIAL PERFUSION IMAGING 01/06/2019  Interpretation Summary  There was no ST segment deviation noted during stress.  The left ventricular ejection fraction is hyperdynamic (>65%).  Nuclear stress EF: 68%.  The study is normal.  This is a low risk study.   ECHOCARDIOGRAM  ECHOCARDIOGRAM COMPLETE 01/29/2022  Narrative ECHOCARDIOGRAM REPORT    [name] Date of Exam: 01/29/2022 Medical Rec #:  [id number]        Height:       63.0 in Accession #:    [id number]       Weight:       218.2 lb Date of Birth:  1941/02/09        BSA:          2.007 m Patient Age:    69 years         BP:           130/58 mmHg Patient Gender: F                HR:           62 bpm. Exam Location:  Outpatient  Procedure: 2D Echo, 3D Echo, Color Doppler, Cardiac Doppler and Strain Analysis  Indications:    R06.9 DOE  History:        Patient has prior history of Echocardiogram examinations, most recent 12/30/2018. Arrythmias:PVC, Signs/Symptoms:Murmur and Dyspnea; Risk Factors:Former Smoker, Dyslipidemia, Diabetes and Hypertension. Covid 19.  Sonographer:    Orvil Holmes RDCS Referring Phys: [id number] Precious Gilchrest S Trase Bunda  IMPRESSIONS   1. Left ventricular ejection fraction, by estimation, is 60 to 65%. Left ventricular ejection fraction by 3D volume is 66 %. The left ventricle has normal function. The left ventricle has no regional wall motion abnormalities. Left ventricular diastolic parameters are consistent with Grade II diastolic dysfunction (pseudonormalization). The average left ventricular global longitudinal strain is -20.0 %. The global longitudinal strain is normal. 2. Right ventricular systolic function is normal. The right ventricular size is normal. There is moderately elevated  pulmonary artery systolic pressure. The estimated right ventricular systolic pressure is 58.6 mmHg. 3. The mitral valve is grossly normal. No evidence of mitral valve regurgitation. No evidence of mitral stenosis. 4. Tricuspid valve regurgitation is mild to moderate. Best visualize in the 4 chamber assessment of the intraatrial septum. 5. The aortic valve was not well visualized. There is moderate calcification of the aortic valve. There is moderate thickening of the aortic valve. Aortic valve regurgitation is not visualized. Aortic valve sclerosis/calcification is present, without any evidence of aortic stenosis. 6. The inferior vena cava is dilated in size with <50% respiratory variability, suggesting right atrial pressure of 15 mmHg.  Comparison(s): Prior images reviewed side by side. Triccuspid regurgitation increased from prior.  FINDINGS Left Ventricle: Left ventricular ejection fraction, by estimation, is 60 to 65%. Left ventricular ejection fraction by 3D volume is 66 %. The left ventricle has normal function. The left ventricle has no regional wall motion abnormalities. The average left ventricular global longitudinal strain is -20.0 %. The global longitudinal strain is normal.  The left ventricular internal cavity size was normal in size. There is no left ventricular hypertrophy. Left ventricular diastolic parameters are consistent with Grade II diastolic dysfunction (pseudonormalization).  Right Ventricle: The right ventricular size is normal. No increase in right ventricular wall thickness. Right ventricular systolic function is normal. There is moderately elevated pulmonary artery systolic pressure. The tricuspid regurgitant velocity is 3.30 m/s, and with an assumed right atrial pressure of 15 mmHg, the estimated right ventricular systolic pressure is 58.6 mmHg.  Left Atrium: Left atrial size was normal in size.  Right Atrium: Right atrial size was normal in size.  Pericardium: There  is no evidence of pericardial effusion.  Mitral Valve: The mitral valve is grossly normal. Mild mitral annular calcification. No evidence of mitral valve regurgitation. No evidence of mitral valve stenosis.  Tricuspid Valve: The tricuspid valve is normal in structure. Tricuspid valve regurgitation is mild to moderate. No evidence of tricuspid stenosis.  Aortic Valve: The aortic valve was not well visualized. There is moderate calcification of the aortic valve. There is moderate thickening of the aortic valve. Aortic valve regurgitation is not visualized. Aortic valve sclerosis/calcification is present, without any evidence of aortic stenosis.  Pulmonic Valve: The pulmonic valve was not well visualized. Pulmonic valve regurgitation is not visualized. No evidence of pulmonic stenosis.  Aorta: The aortic root and ascending aorta are structurally normal, with no evidence of dilitation.  Venous: The inferior vena cava is dilated in size with less than 50% respiratory variability, suggesting right atrial pressure of 15 mmHg.  IAS/Shunts: The atrial septum is grossly normal.   LEFT VENTRICLE PLAX 2D LVIDd:         4.77 cm         Diastology LVIDs:         2.71 cm         LV e' medial:    6.64 cm/s LV PW:         1.05 cm         LV E/e' medial:  14.8 LV IVS:        0.99 cm         LV e' lateral:   8.92 cm/s LVOT diam:     1.80 cm         LV E/e' lateral: 11.0 LV SV:         63 LV SV Index:   32              2D LVOT Area:     2.54 cm        Longitudinal Strain 2D Strain GLS  -20.0 % Avg:  3D Volume EF LV 3D EF:    Left ventricul ar ejection fraction by 3D volume is 66 %.  3D Volume EF: 3D EF:        66 % LV EDV:       177 ml LV ESV:       60 ml LV SV:        118 ml  RIGHT VENTRICLE RV Basal diam:  4.51 cm RV Mid diam:    3.09 cm RV S prime:     10.10 cm/s TAPSE (M-mode): 2.4 cm  LEFT ATRIUM             Index        RIGHT ATRIUM           Index LA diam:        4.50 cm  2.24 cm/m  RA Area:     20.90 cm LA Vol (A2C):   47.0 ml 23.42 ml/m  RA Volume:   56.80 ml  28.31 ml/m LA Vol (A4C):   55.2 ml 27.51 ml/m LA Biplane Vol: 53.4 ml 26.61 ml/m AORTIC VALVE LVOT Vmax:   95.90 cm/s LVOT Vmean:  63.400 cm/s LVOT VTI:    0.249 m  AORTA Ao Root diam: 2.60 cm Ao Asc diam:  2.80 cm  MITRAL VALVE               TRICUSPID VALVE MV Area (PHT): 3.37 cm    TR Peak grad:   43.6 mmHg MV Decel Time: 225 msec    TR Vmax:        330.00 cm/s MR Peak grad: 71.9 mmHg MR Vmax:      424.00 cm/s  SHUNTS MV E velocity: 98.00 cm/s  Systemic VTI:  0.25 m MV A velocity: 52.90 cm/s  Systemic Diam: 1.80 cm MV E/A ratio:  1.85  Stanly Leavens MD Electronically signed by Stanly Leavens MD Signature Date/Time: 01/29/2022/12:32:50 PM    Final    MONITORS  LONG TERM MONITOR (3-14 DAYS) 05/20/2022  Narrative Patch Wear Time:  13 days and 10 hours (2024-01-13T09:48:17-0500 to 2024-01-26T19:50:12-498)  Patient had a min HR of 38 bpm, max HR of 160 bpm, and avg HR of 54 bpm. Predominant underlying rhythm was Sinus Rhythm. 1 run of Ventricular Tachycardia occurred lasting 4 beats with a max rate of 148 bpm (avg 133 bpm). 93 Supraventricular Tachycardia runs occurred, the run with the fastest interval lasting 6 beats with a max rate of 160 bpm, the longest lasting 18.3 secs with an avg rate of 97 bpm. Isolated SVEs were rare (<1.0%), SVE Couplets were rare (<1.0%), and SVE Triplets were rare (<1.0%). Isolated VEs were frequent (5.3%, [id number]), VE Couplets were rare (<1.0%, 78), and no VE Triplets were present. Ventricular Bigeminy and Trigeminy were present  SR/SB Freq PVCs (5.3% burden). Occasional short runs of NSVT 93 short runs of PSVT (longest 18 seconds at 97 BPM)       ______________________________________________________________________________________________        Risk Assessment/Calculations:             Physical Exam:   VS:  BP 124/78    Pulse (!) 49   Ht 5' 2.5 (1.588 m)   Wt 215 lb 12.8 oz (97.9 kg)   SpO2 94%   BMI 38.84 kg/m    Wt Readings from Last 3 Encounters:  02/10/24 215 lb 12.8 oz (97.9 kg)  05/19/23 212 lb (96.2 kg)  04/14/23 205 lb (93 kg)    Vitals:   02/10/24 1443 02/10/24 1514  BP: (!) 142/60 124/78  Pulse: (!) 49   Height: 5' 2.5 (1.588 m)   Weight: 215 lb 12.8 oz (97.9 kg)   SpO2: 94%   BMI (Calculated): 38.82     GEN: Well nourished, overweight, well developed in no acute distress NECK: No JVD; No carotid bruits CARDIAC: RRR, no murmurs, rubs, gallops RESPIRATORY:  Clear to auscultation without rales, wheezing or rhonchi  ABDOMEN: Soft, non-tender, non-distended EXTREMITIES:  Bilateral LE with 2+ pitting edema and mild erythema; No deformity   ASSESSMENT AND PLAN: .   CAD -nonobstructive disease by [facility] 04/2022.  No recurrent chest pain, there was question of coronary spasm at time of LHC.  Continue aspirin , statin, metoprolol , amlodipine .   HFpEF - NYHA II, stable from rpevious.  Echo 01/2019 LVEF 60 to 65%, grade  2 diastolic dysfunction.  Continue GDMT Jardiance ,  metoprolol , Torsemide . Has not required additional PRN Torsemide . Entresto  previously discontinued for protection of renal function and to avoid hypotension.  Hesitant to add MRA due to relative hypotension. Low sodium diet, fluid restriction <2L, and daily weights encouraged. Educated to contact our office for weight gain of 2 lbs overnight or 5 lbs in one week.     PVC / Atrial tachycardia-did not tolerate amiodarone  due to bradycardia. EKG today SB with PAC, reports occasional palpitations.  ZIO 05/2022 with no atrial fib and PVC burden 5.3%.  Due to significant bradycardia, fatigue - stop metoprolol  tartrate. Start metoprolol  succinate 12.5mg  daily.    HTN - BP well controlled. Continue current antihypertensive regimen.     PAD / HLD / Carotid artery stenosis - 06/2021 carotid duplex 1-39% stenosis bilaterally. Repeat as  clinically indicated. Continue Atorvastatin  80mg  daily. Denies myalgias, claudication.    CKD 3 - Careful titration of diuretic and antihypertensive.   Obesity -Weight loss via diet and exercise encouraged. Discussed the impact being overweight would have on cardiovascular risk.         Dispo: follow up in 6 months  Signed, Reche GORMAN Finder, NP

## 2024-02-16 ENCOUNTER — Encounter: Payer: Self-pay | Admitting: Radiology

## 2024-02-23 ENCOUNTER — Other Ambulatory Visit: Payer: Self-pay | Admitting: Vascular Surgery

## 2024-02-23 DIAGNOSIS — M7989 Other specified soft tissue disorders: Secondary | ICD-10-CM

## 2024-02-24 ENCOUNTER — Ambulatory Visit: Admitting: Podiatry

## 2024-03-04 ENCOUNTER — Encounter: Payer: Self-pay | Admitting: Podiatry

## 2024-03-04 ENCOUNTER — Ambulatory Visit (INDEPENDENT_AMBULATORY_CARE_PROVIDER_SITE_OTHER): Admitting: Podiatry

## 2024-03-04 DIAGNOSIS — E1142 Type 2 diabetes mellitus with diabetic polyneuropathy: Secondary | ICD-10-CM | POA: Diagnosis not present

## 2024-03-04 DIAGNOSIS — D2372 Other benign neoplasm of skin of left lower limb, including hip: Secondary | ICD-10-CM | POA: Diagnosis not present

## 2024-03-04 DIAGNOSIS — B351 Tinea unguium: Secondary | ICD-10-CM

## 2024-03-04 DIAGNOSIS — D2371 Other benign neoplasm of skin of right lower limb, including hip: Secondary | ICD-10-CM | POA: Diagnosis not present

## 2024-03-04 DIAGNOSIS — M79676 Pain in unspecified toe(s): Secondary | ICD-10-CM | POA: Diagnosis not present

## 2024-03-08 NOTE — Progress Notes (Signed)
 She presents today chief complaint of painful toenails and calluses bilateral.  States that her feet just hurt all the time.  Objective: Vitals are stable alert oriented x 3 pulses are palpable no open lesions or wounds.  Capillary fill time is immediate.  Neurological Sorum is intact toenails are long thick yellow dystrophic onychomycotic multiple reactive hyperkeratotic lesions plantar aspect of the bilateral forefoot and toes.  Assessment: Pain limb secondary to onychomycosis and benign skin lesions.  Plan: Debridement of skin lesion debridement of toenails 1 through 5 bilateral.

## 2024-03-18 ENCOUNTER — Ambulatory Visit: Admitting: Orthopedic Surgery

## 2024-03-18 ENCOUNTER — Other Ambulatory Visit: Payer: Self-pay

## 2024-03-18 ENCOUNTER — Encounter: Payer: Self-pay | Admitting: Orthopedic Surgery

## 2024-03-18 DIAGNOSIS — M1611 Unilateral primary osteoarthritis, right hip: Secondary | ICD-10-CM

## 2024-03-18 DIAGNOSIS — M25551 Pain in right hip: Secondary | ICD-10-CM | POA: Diagnosis not present

## 2024-03-18 DIAGNOSIS — M13851 Other specified arthritis, right hip: Secondary | ICD-10-CM
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
## 2024-03-19 ENCOUNTER — Encounter: Payer: Self-pay | Admitting: Orthopedic Surgery

## 2024-03-19 MED ORDER — TRIAMCINOLONE ACETONIDE 40 MG/ML IJ SUSP
40.0000 mg | INTRAMUSCULAR | Status: AC | PRN
  Administered 2024-03-18: 40 mg via INTRA_ARTICULAR

## 2024-03-19 MED ORDER — BUPIVACAINE HCL 0.25 % IJ SOLN
4.0000 mL | INTRAMUSCULAR | Status: AC | PRN
  Administered 2024-03-18: 4 mL via INTRA_ARTICULAR

## 2024-03-19 MED ORDER — LIDOCAINE HCL 1 % IJ SOLN
5.0000 mL | INTRAMUSCULAR | Status: AC | PRN
  Administered 2024-03-18: 5 mL

## 2024-03-22 ENCOUNTER — Other Ambulatory Visit (HOSPITAL_BASED_OUTPATIENT_CLINIC_OR_DEPARTMENT_OTHER): Payer: Self-pay | Admitting: Family

## 2024-04-19 ENCOUNTER — Ambulatory Visit (HOSPITAL_COMMUNITY)
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
## 2024-04-20 ENCOUNTER — Ambulatory Visit: Admitting: Vascular Surgery

## 2024-04-20 ENCOUNTER — Encounter: Payer: Self-pay | Admitting: Vascular Surgery

## 2024-04-20 ENCOUNTER — Ambulatory Visit
Admission: RE | Admit: 2024-04-20 | Discharge: 2024-04-20 | Disposition: A | Discharge status: Home / Self Care | Source: Ambulatory Visit | Attending: Vascular Surgery | Admitting: Vascular Surgery

## 2024-04-20 VITALS — BP 128/62 | HR 60 | Ht 62.0 in | Wt 219.0 lb

## 2024-04-20 DIAGNOSIS — M7989 Other specified soft tissue disorders: Secondary | ICD-10-CM | POA: Diagnosis not present

## 2024-04-20 DIAGNOSIS — I872 Venous insufficiency (chronic) (peripheral): Secondary | ICD-10-CM | POA: Diagnosis not present

## 2024-05-03 ENCOUNTER — Other Ambulatory Visit: Payer: Self-pay

## 2024-05-06 ENCOUNTER — Telehealth: Payer: Self-pay | Admitting: Family

## 2024-05-06 ENCOUNTER — Emergency Department (HOSPITAL_COMMUNITY)
Admission: EM | Admit: 2024-05-06 | Discharge: 2024-05-07 | Disposition: A | Discharge status: Home / Self Care | Attending: Emergency Medicine | Admitting: Emergency Medicine

## 2024-05-06 ENCOUNTER — Encounter (HOSPITAL_COMMUNITY): Payer: Self-pay | Admitting: Emergency Medicine

## 2024-05-06 ENCOUNTER — Other Ambulatory Visit: Payer: Self-pay

## 2024-05-06 DIAGNOSIS — W19XXXA Unspecified fall, initial encounter: Secondary | ICD-10-CM | POA: Insufficient documentation

## 2024-05-06 DIAGNOSIS — Z7982 Long term (current) use of aspirin: Secondary | ICD-10-CM | POA: Diagnosis not present

## 2024-05-06 DIAGNOSIS — Z8542 Personal history of malignant neoplasm of other parts of uterus: Secondary | ICD-10-CM | POA: Diagnosis not present

## 2024-05-06 DIAGNOSIS — E119 Type 2 diabetes mellitus without complications: Secondary | ICD-10-CM | POA: Insufficient documentation

## 2024-05-06 DIAGNOSIS — S43402A Unspecified sprain of left shoulder joint, initial encounter: Secondary | ICD-10-CM | POA: Insufficient documentation

## 2024-05-06 DIAGNOSIS — S4992XA Unspecified injury of left shoulder and upper arm, initial encounter: Secondary | ICD-10-CM | POA: Diagnosis present

## 2024-05-06 DIAGNOSIS — Z79899 Other long term (current) drug therapy: Secondary | ICD-10-CM | POA: Diagnosis not present

## 2024-05-06 DIAGNOSIS — M25522 Pain in left elbow: Secondary | ICD-10-CM | POA: Diagnosis not present

## 2024-05-06 DIAGNOSIS — Z7901 Long term (current) use of anticoagulants: Secondary | ICD-10-CM | POA: Diagnosis not present

## 2024-05-06 DIAGNOSIS — I10 Essential (primary) hypertension: Secondary | ICD-10-CM | POA: Diagnosis not present

## 2024-05-06 NOTE — Telephone Encounter (Signed)
" °*  STAT* If patient is at the pharmacy, call can be transferred to refill team.   1. Which medications need to be refilled? (please list name of each medication and dose if known)   JARDIANCE  10 MG TABS tablet    2. Would you like to learn more about the convenience, safety, & potential cost savings by using the [facility]? no   3. Are you open to using the [facility] (Type [facility]. no   4. Which pharmacy/location (including street and city if local pharmacy) is medication to be sent to?  [id number] - [place], [place] - [address]    5. Do they need a 30 day or 90 day supply? 90 day    Pt is completely out.  "

## 2024-05-06 NOTE — Telephone Encounter (Signed)
 Pt sister following up. Please advise.

## 2024-05-06 NOTE — ED Triage Notes (Signed)
 Pt to ED via [facility] from home c/o mechanical fall tonight.  Pt was making pitcher of sweet tea and was setting it on the counter when it became off balance and pt tried to catch it and ended up becoming off balance and falling onto left arm.  Denies hitting head, takes baby ASA daily.  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  Pain is to left shoulder radiating to left elbow.  Sensation and movement intact in LUE, no obvious deformity.  Pt presents A&Ox4.  EMS vitals 168/76 BP, 65 HR, 97% RA, 112 CBG.

## 2024-05-07 ENCOUNTER — Emergency Department (HOSPITAL_COMMUNITY)

## 2024-05-07 MED ORDER — HYDROCODONE-ACETAMINOPHEN 5-325 MG PO TABS
1.0000 | ORAL_TABLET | Freq: Once | ORAL | Status: AC
  Administered 2024-05-07: 1 via ORAL
  Filled 2024-05-07: qty 1

## 2024-05-07 MED ORDER — ONDANSETRON 4 MG PO TBDP
4.0000 mg | ORAL_TABLET | Freq: Once | ORAL | Status: AC
  Administered 2024-05-07: 4 mg via ORAL
  Filled 2024-05-07: qty 1

## 2024-05-07 NOTE — Discharge Instructions (Addendum)
 You can continue your home pain medicines. Be sure to move your arm each day to keep your strength.  If no improvement in 1 week please see the orthopedic doctor listed There are no signs of any broken bones in your shoulder or arm

## 2024-05-07 NOTE — ED Notes (Signed)
 Pt was wet when the Tech went into walk her. Pt needed to be cleaned up and dry bedding. Pt was ambulated for 6 or so steps from the bed with the walker and then placed in a chair while we cleaned her bed. Pt was then placed back in the bed, the purwick was replaced and clean/dry brief was placed on the pt. Pt had no other needs to be addressed at this time. KCM~

## 2024-05-07 NOTE — ED Notes (Signed)
 Made RN/Paramedic aware patient 02 sat was 84 percent.

## 2024-05-07 NOTE — ED Notes (Signed)
 Monitor was noted that oxygen saturation was 87% on room air. Pt contact was made and the pleth was not great. Pt was started to talk and saturation went to around 90% on room air. Pt was went back to just resting and her saturation dropped again to 88%. Pt was engaged to have a conversation and her saturation increased to 90%. Pt was placed on [place] at 2 lpm. Pt saturation is now 93%

## 2024-05-07 NOTE — ED Notes (Addendum)
 Pt ambulated with walker with minimal assistance from bed to door of room. Pt guarded left shoulder c/o of it still being painful. Pt walked with walker well with standby assistance if needed.
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
## 2024-05-10 ENCOUNTER — Encounter (HOSPITAL_BASED_OUTPATIENT_CLINIC_OR_DEPARTMENT_OTHER): Payer: Self-pay

## 2024-05-10 ENCOUNTER — Other Ambulatory Visit (HOSPITAL_BASED_OUTPATIENT_CLINIC_OR_DEPARTMENT_OTHER): Payer: Self-pay | Admitting: *Deleted

## 2024-05-10 MED ORDER — EMPAGLIFLOZIN 10 MG PO TABS: 10.0000 mg | ORAL_TABLET | Freq: Every day | ORAL | 3 refills | Status: DC

## 2024-05-10 MED ORDER — EMPAGLIFLOZIN 10 MG PO TABS: 10.0000 mg | ORAL_TABLET | Freq: Every day | ORAL | 3 refills | Status: AC

## 2024-05-10 NOTE — Telephone Encounter (Signed)
 Labs done on 12/29/23 outside of Normal Range  In accordance with refill protocols, please review and address the following requirements before this medication refill can be authorized:  Labs

## 2024-05-11 NOTE — Telephone Encounter (Signed)
 Refilled 04/30/24

## 2024-06-03 ENCOUNTER — Ambulatory Visit: Admitting: Podiatry

## 2024-08-04 ENCOUNTER — Ambulatory Visit: Admitting: Vascular Surgery
